# Patient Record
Sex: Female | Born: 1935 | Race: Black or African American | Hispanic: No | Smoking: Never smoker
Health system: Southern US, Community
[De-identification: ages and names within clinical notes are randomized; demographics above are authoritative.]

## PROBLEM LIST (undated history)

## (undated) DIAGNOSIS — E7800 Pure hypercholesterolemia, unspecified: Secondary | ICD-10-CM

## (undated) DIAGNOSIS — C169 Malignant neoplasm of stomach, unspecified: Secondary | ICD-10-CM

## (undated) DIAGNOSIS — Z860100 Personal history of colon polyps, unspecified: Secondary | ICD-10-CM

## (undated) DIAGNOSIS — K219 Gastro-esophageal reflux disease without esophagitis: Secondary | ICD-10-CM

## (undated) DIAGNOSIS — N200 Calculus of kidney: Secondary | ICD-10-CM

## (undated) DIAGNOSIS — M8580 Other specified disorders of bone density and structure, unspecified site: Secondary | ICD-10-CM

## (undated) DIAGNOSIS — E079 Disorder of thyroid, unspecified: Secondary | ICD-10-CM

## (undated) DIAGNOSIS — C49A0 Gastrointestinal stromal tumor, unspecified site: Secondary | ICD-10-CM

## (undated) DIAGNOSIS — C50919 Malignant neoplasm of unspecified site of unspecified female breast: Secondary | ICD-10-CM

## (undated) DIAGNOSIS — Z9221 Personal history of antineoplastic chemotherapy: Secondary | ICD-10-CM

## (undated) DIAGNOSIS — Z8619 Personal history of other infectious and parasitic diseases: Secondary | ICD-10-CM

## (undated) DIAGNOSIS — I10 Essential (primary) hypertension: Secondary | ICD-10-CM

## (undated) HISTORY — DX: Gastrointestinal stromal tumor, unspecified site: C49.A0

## (undated) HISTORY — DX: Personal history of colon polyps, unspecified: Z86.0100

## (undated) HISTORY — PX: TUBAL LIGATION: SHX77

## (undated) HISTORY — PX: ABDOMINAL HYSTERECTOMY: SHX81

## (undated) HISTORY — DX: Disorder of thyroid, unspecified: E07.9

## (undated) HISTORY — DX: Personal history of colonic polyps: Z86.010

## (undated) HISTORY — PX: MASTECTOMY: SHX3

## (undated) HISTORY — DX: Malignant neoplasm of stomach, unspecified: C16.9

## (undated) HISTORY — PX: APPENDECTOMY: SHX54

## (undated) HISTORY — PX: TONSILLECTOMY: SUR1361

## (undated) HISTORY — DX: Calculus of kidney: N20.0

## (undated) HISTORY — DX: Personal history of other infectious and parasitic diseases: Z86.19

## (undated) HISTORY — DX: Other specified disorders of bone density and structure, unspecified site: M85.80

---

## 1976-10-20 HISTORY — PX: THYROIDECTOMY: SHX17

## 1977-10-20 HISTORY — PX: SALPINGOOPHORECTOMY: SHX82

## 1997-10-20 DIAGNOSIS — C50919 Malignant neoplasm of unspecified site of unspecified female breast: Secondary | ICD-10-CM

## 1997-10-20 HISTORY — PX: BREAST EXCISIONAL BIOPSY: SUR124

## 1997-10-20 HISTORY — PX: BREAST SURGERY: SHX581

## 1997-10-20 HISTORY — DX: Malignant neoplasm of unspecified site of unspecified female breast: C50.919

## 2004-08-20 ENCOUNTER — Ambulatory Visit: Payer: Self-pay | Admitting: Pain Medicine

## 2004-08-27 ENCOUNTER — Ambulatory Visit: Payer: Self-pay | Admitting: Oncology

## 2004-08-28 ENCOUNTER — Ambulatory Visit: Payer: Self-pay | Admitting: Pain Medicine

## 2004-09-19 ENCOUNTER — Ambulatory Visit: Payer: Self-pay | Admitting: Oncology

## 2004-10-08 ENCOUNTER — Ambulatory Visit: Payer: Self-pay | Admitting: Pain Medicine

## 2004-11-06 ENCOUNTER — Ambulatory Visit: Payer: Self-pay | Admitting: Pain Medicine

## 2004-12-05 ENCOUNTER — Ambulatory Visit: Payer: Self-pay | Admitting: Pain Medicine

## 2005-01-02 ENCOUNTER — Ambulatory Visit: Payer: Self-pay | Admitting: Pain Medicine

## 2005-01-15 ENCOUNTER — Ambulatory Visit: Payer: Self-pay | Admitting: Pain Medicine

## 2005-01-20 ENCOUNTER — Ambulatory Visit: Payer: Self-pay | Admitting: General Surgery

## 2005-02-20 ENCOUNTER — Ambulatory Visit: Payer: Self-pay | Admitting: Pain Medicine

## 2005-02-26 ENCOUNTER — Ambulatory Visit: Payer: Self-pay | Admitting: Pain Medicine

## 2005-03-05 ENCOUNTER — Ambulatory Visit: Payer: Self-pay | Admitting: Oncology

## 2005-03-20 ENCOUNTER — Ambulatory Visit: Payer: Self-pay | Admitting: Oncology

## 2005-04-03 ENCOUNTER — Ambulatory Visit: Payer: Self-pay | Admitting: Pain Medicine

## 2005-04-23 ENCOUNTER — Ambulatory Visit: Payer: Self-pay | Admitting: Pain Medicine

## 2005-06-05 ENCOUNTER — Ambulatory Visit: Payer: Self-pay | Admitting: Pain Medicine

## 2005-07-02 ENCOUNTER — Ambulatory Visit: Payer: Self-pay | Admitting: Pain Medicine

## 2005-08-02 ENCOUNTER — Emergency Department: Payer: Self-pay | Admitting: General Practice

## 2005-08-05 ENCOUNTER — Ambulatory Visit: Payer: Self-pay | Admitting: Pain Medicine

## 2005-09-03 ENCOUNTER — Ambulatory Visit: Payer: Self-pay | Admitting: Oncology

## 2005-09-20 ENCOUNTER — Ambulatory Visit: Payer: Self-pay | Admitting: Internal Medicine

## 2005-09-24 ENCOUNTER — Ambulatory Visit: Payer: Self-pay | Admitting: Pain Medicine

## 2005-11-13 ENCOUNTER — Ambulatory Visit: Payer: Self-pay | Admitting: Pain Medicine

## 2005-11-24 ENCOUNTER — Ambulatory Visit: Payer: Self-pay | Admitting: Pain Medicine

## 2005-12-30 ENCOUNTER — Ambulatory Visit: Payer: Self-pay | Admitting: Pain Medicine

## 2006-01-07 ENCOUNTER — Ambulatory Visit: Payer: Self-pay | Admitting: Pain Medicine

## 2006-02-10 ENCOUNTER — Ambulatory Visit: Payer: Self-pay | Admitting: Pain Medicine

## 2006-03-11 ENCOUNTER — Ambulatory Visit: Payer: Self-pay | Admitting: General Surgery

## 2006-03-23 ENCOUNTER — Ambulatory Visit: Payer: Self-pay | Admitting: Oncology

## 2006-03-23 ENCOUNTER — Ambulatory Visit: Payer: Self-pay | Admitting: Pain Medicine

## 2006-04-16 ENCOUNTER — Ambulatory Visit: Payer: Self-pay | Admitting: Pain Medicine

## 2006-05-13 ENCOUNTER — Ambulatory Visit: Payer: Self-pay | Admitting: Pain Medicine

## 2006-06-25 ENCOUNTER — Ambulatory Visit: Payer: Self-pay | Admitting: Pain Medicine

## 2006-07-27 ENCOUNTER — Ambulatory Visit: Payer: Self-pay | Admitting: Pain Medicine

## 2006-08-20 ENCOUNTER — Ambulatory Visit: Payer: Self-pay | Admitting: Pain Medicine

## 2006-09-18 ENCOUNTER — Ambulatory Visit: Payer: Self-pay | Admitting: Oncology

## 2006-10-05 ENCOUNTER — Ambulatory Visit: Payer: Self-pay | Admitting: Pain Medicine

## 2006-10-20 HISTORY — PX: COLONOSCOPY: SHX174

## 2006-11-19 ENCOUNTER — Ambulatory Visit: Payer: Self-pay | Admitting: Pain Medicine

## 2006-12-17 ENCOUNTER — Ambulatory Visit: Payer: Self-pay | Admitting: Pain Medicine

## 2006-12-28 ENCOUNTER — Ambulatory Visit: Payer: Self-pay | Admitting: Pain Medicine

## 2007-02-04 ENCOUNTER — Ambulatory Visit: Payer: Self-pay | Admitting: Pain Medicine

## 2007-02-18 ENCOUNTER — Ambulatory Visit: Payer: Self-pay | Admitting: Oncology

## 2007-03-09 ENCOUNTER — Ambulatory Visit: Payer: Self-pay | Admitting: Pain Medicine

## 2007-03-17 ENCOUNTER — Ambulatory Visit: Payer: Self-pay | Admitting: Oncology

## 2007-03-21 ENCOUNTER — Ambulatory Visit: Payer: Self-pay | Admitting: Oncology

## 2007-03-24 ENCOUNTER — Ambulatory Visit: Payer: Self-pay | Admitting: Pain Medicine

## 2007-04-20 ENCOUNTER — Ambulatory Visit: Payer: Self-pay | Admitting: Oncology

## 2007-04-21 ENCOUNTER — Ambulatory Visit: Payer: Self-pay | Admitting: Oncology

## 2007-05-06 ENCOUNTER — Ambulatory Visit: Payer: Self-pay | Admitting: Pain Medicine

## 2007-05-17 ENCOUNTER — Ambulatory Visit: Payer: Self-pay | Admitting: General Surgery

## 2007-06-24 ENCOUNTER — Ambulatory Visit: Payer: Self-pay | Admitting: Pain Medicine

## 2007-07-29 ENCOUNTER — Ambulatory Visit: Payer: Self-pay | Admitting: Pain Medicine

## 2007-08-21 ENCOUNTER — Ambulatory Visit: Payer: Self-pay | Admitting: Oncology

## 2007-09-13 ENCOUNTER — Ambulatory Visit: Payer: Self-pay | Admitting: Oncology

## 2007-09-20 ENCOUNTER — Ambulatory Visit: Payer: Self-pay | Admitting: Oncology

## 2007-09-23 ENCOUNTER — Ambulatory Visit: Payer: Self-pay | Admitting: Pain Medicine

## 2007-12-16 ENCOUNTER — Ambulatory Visit: Payer: Self-pay | Admitting: Pain Medicine

## 2008-01-12 ENCOUNTER — Ambulatory Visit: Payer: Self-pay | Admitting: Pain Medicine

## 2008-02-18 ENCOUNTER — Ambulatory Visit: Payer: Self-pay | Admitting: Oncology

## 2008-02-29 ENCOUNTER — Ambulatory Visit: Payer: Self-pay | Admitting: Pain Medicine

## 2008-03-15 ENCOUNTER — Ambulatory Visit: Payer: Self-pay | Admitting: Oncology

## 2008-03-20 ENCOUNTER — Ambulatory Visit: Payer: Self-pay | Admitting: Oncology

## 2008-04-25 ENCOUNTER — Ambulatory Visit: Payer: Self-pay | Admitting: Pain Medicine

## 2008-04-25 ENCOUNTER — Ambulatory Visit: Payer: Self-pay | Admitting: General Surgery

## 2008-05-10 ENCOUNTER — Ambulatory Visit: Payer: Self-pay | Admitting: Family Medicine

## 2008-06-20 ENCOUNTER — Ambulatory Visit: Payer: Self-pay | Admitting: Pain Medicine

## 2008-07-03 ENCOUNTER — Ambulatory Visit: Payer: Self-pay | Admitting: Pain Medicine

## 2008-08-08 ENCOUNTER — Ambulatory Visit: Payer: Self-pay | Admitting: Pain Medicine

## 2008-09-12 ENCOUNTER — Ambulatory Visit: Payer: Self-pay | Admitting: Pain Medicine

## 2008-09-20 ENCOUNTER — Ambulatory Visit: Payer: Self-pay | Admitting: Pain Medicine

## 2008-10-09 ENCOUNTER — Ambulatory Visit: Payer: Self-pay | Admitting: Pain Medicine

## 2008-12-13 ENCOUNTER — Ambulatory Visit: Payer: Self-pay | Admitting: Pain Medicine

## 2009-02-15 ENCOUNTER — Ambulatory Visit: Payer: Self-pay | Admitting: Pain Medicine

## 2009-03-20 ENCOUNTER — Ambulatory Visit: Payer: Self-pay | Admitting: Oncology

## 2009-03-23 ENCOUNTER — Ambulatory Visit: Payer: Self-pay | Admitting: Oncology

## 2009-04-19 ENCOUNTER — Ambulatory Visit: Payer: Self-pay | Admitting: Oncology

## 2009-04-30 ENCOUNTER — Ambulatory Visit: Payer: Self-pay | Admitting: General Surgery

## 2009-05-17 ENCOUNTER — Ambulatory Visit: Payer: Self-pay | Admitting: Pain Medicine

## 2009-06-04 ENCOUNTER — Ambulatory Visit: Payer: Self-pay | Admitting: Pain Medicine

## 2009-06-14 ENCOUNTER — Ambulatory Visit: Payer: Self-pay | Admitting: Pain Medicine

## 2009-09-04 ENCOUNTER — Ambulatory Visit: Payer: Self-pay | Admitting: Pain Medicine

## 2009-10-09 ENCOUNTER — Ambulatory Visit: Payer: Self-pay | Admitting: Pain Medicine

## 2009-10-17 ENCOUNTER — Ambulatory Visit: Payer: Self-pay | Admitting: Pain Medicine

## 2009-12-20 ENCOUNTER — Ambulatory Visit: Payer: Self-pay | Admitting: Pain Medicine

## 2009-12-24 ENCOUNTER — Ambulatory Visit: Payer: Self-pay | Admitting: Pain Medicine

## 2010-01-22 ENCOUNTER — Ambulatory Visit: Payer: Self-pay | Admitting: Pain Medicine

## 2010-03-21 ENCOUNTER — Ambulatory Visit: Payer: Self-pay | Admitting: Pain Medicine

## 2010-04-19 ENCOUNTER — Ambulatory Visit: Payer: Self-pay | Admitting: Ophthalmology

## 2010-04-29 ENCOUNTER — Ambulatory Visit: Payer: Self-pay | Admitting: Ophthalmology

## 2010-05-01 ENCOUNTER — Ambulatory Visit: Payer: Self-pay | Admitting: General Surgery

## 2010-05-07 ENCOUNTER — Ambulatory Visit: Payer: Self-pay | Admitting: Pain Medicine

## 2010-08-13 ENCOUNTER — Ambulatory Visit: Payer: Self-pay | Admitting: Pain Medicine

## 2010-10-20 HISTORY — PX: HERNIA REPAIR: SHX51

## 2010-10-29 ENCOUNTER — Ambulatory Visit: Payer: Self-pay | Admitting: Pain Medicine

## 2011-02-03 ENCOUNTER — Ambulatory Visit: Payer: Self-pay | Admitting: Pain Medicine

## 2011-05-01 ENCOUNTER — Ambulatory Visit: Payer: Self-pay | Admitting: Pain Medicine

## 2011-05-07 ENCOUNTER — Ambulatory Visit: Payer: Self-pay | Admitting: Pain Medicine

## 2011-05-22 ENCOUNTER — Ambulatory Visit: Payer: Self-pay | Admitting: General Surgery

## 2011-05-29 ENCOUNTER — Ambulatory Visit: Payer: Self-pay | Admitting: Pain Medicine

## 2011-07-01 ENCOUNTER — Ambulatory Visit: Payer: Self-pay | Admitting: Family Medicine

## 2011-08-19 ENCOUNTER — Ambulatory Visit: Payer: Self-pay | Admitting: Pain Medicine

## 2011-10-21 DIAGNOSIS — C169 Malignant neoplasm of stomach, unspecified: Secondary | ICD-10-CM

## 2011-10-21 DIAGNOSIS — C49A0 Gastrointestinal stromal tumor, unspecified site: Secondary | ICD-10-CM

## 2011-10-21 HISTORY — PX: UPPER GI ENDOSCOPY: SHX6162

## 2011-10-21 HISTORY — DX: Malignant neoplasm of stomach, unspecified: C16.9

## 2011-10-21 HISTORY — PX: EYE SURGERY: SHX253

## 2011-10-21 HISTORY — DX: Gastrointestinal stromal tumor, unspecified site: C49.A0

## 2011-10-21 HISTORY — PX: LAPAROTOMY: SHX154

## 2011-10-24 DIAGNOSIS — K219 Gastro-esophageal reflux disease without esophagitis: Secondary | ICD-10-CM | POA: Diagnosis not present

## 2011-10-24 DIAGNOSIS — R197 Diarrhea, unspecified: Secondary | ICD-10-CM | POA: Diagnosis not present

## 2011-11-20 ENCOUNTER — Ambulatory Visit: Payer: Self-pay | Admitting: Pain Medicine

## 2011-11-20 DIAGNOSIS — Z853 Personal history of malignant neoplasm of breast: Secondary | ICD-10-CM | POA: Diagnosis not present

## 2011-11-20 DIAGNOSIS — IMO0002 Reserved for concepts with insufficient information to code with codable children: Secondary | ICD-10-CM | POA: Diagnosis not present

## 2011-11-20 DIAGNOSIS — R51 Headache: Secondary | ICD-10-CM | POA: Diagnosis not present

## 2011-11-20 DIAGNOSIS — M79609 Pain in unspecified limb: Secondary | ICD-10-CM | POA: Diagnosis not present

## 2011-11-20 DIAGNOSIS — Z9889 Other specified postprocedural states: Secondary | ICD-10-CM | POA: Diagnosis not present

## 2011-11-20 DIAGNOSIS — M5137 Other intervertebral disc degeneration, lumbosacral region: Secondary | ICD-10-CM | POA: Diagnosis not present

## 2011-11-20 DIAGNOSIS — Z7982 Long term (current) use of aspirin: Secondary | ICD-10-CM | POA: Diagnosis not present

## 2011-11-20 DIAGNOSIS — Z79899 Other long term (current) drug therapy: Secondary | ICD-10-CM | POA: Diagnosis not present

## 2011-11-20 DIAGNOSIS — M545 Low back pain, unspecified: Secondary | ICD-10-CM | POA: Diagnosis not present

## 2011-12-09 DIAGNOSIS — K3189 Other diseases of stomach and duodenum: Secondary | ICD-10-CM | POA: Diagnosis not present

## 2011-12-09 DIAGNOSIS — R1013 Epigastric pain: Secondary | ICD-10-CM | POA: Diagnosis not present

## 2011-12-09 DIAGNOSIS — I10 Essential (primary) hypertension: Secondary | ICD-10-CM | POA: Diagnosis not present

## 2011-12-09 DIAGNOSIS — Z23 Encounter for immunization: Secondary | ICD-10-CM | POA: Diagnosis not present

## 2011-12-09 DIAGNOSIS — A048 Other specified bacterial intestinal infections: Secondary | ICD-10-CM | POA: Diagnosis not present

## 2011-12-09 DIAGNOSIS — R1012 Left upper quadrant pain: Secondary | ICD-10-CM | POA: Diagnosis not present

## 2011-12-19 DIAGNOSIS — E119 Type 2 diabetes mellitus without complications: Secondary | ICD-10-CM | POA: Diagnosis not present

## 2011-12-19 DIAGNOSIS — R1012 Left upper quadrant pain: Secondary | ICD-10-CM | POA: Diagnosis not present

## 2011-12-19 DIAGNOSIS — E7800 Pure hypercholesterolemia, unspecified: Secondary | ICD-10-CM | POA: Diagnosis not present

## 2011-12-19 DIAGNOSIS — A048 Other specified bacterial intestinal infections: Secondary | ICD-10-CM | POA: Diagnosis not present

## 2011-12-19 DIAGNOSIS — I10 Essential (primary) hypertension: Secondary | ICD-10-CM | POA: Diagnosis not present

## 2011-12-23 DIAGNOSIS — E785 Hyperlipidemia, unspecified: Secondary | ICD-10-CM | POA: Diagnosis not present

## 2011-12-23 DIAGNOSIS — E119 Type 2 diabetes mellitus without complications: Secondary | ICD-10-CM | POA: Diagnosis not present

## 2011-12-23 DIAGNOSIS — I10 Essential (primary) hypertension: Secondary | ICD-10-CM | POA: Diagnosis not present

## 2011-12-23 DIAGNOSIS — R718 Other abnormality of red blood cells: Secondary | ICD-10-CM | POA: Diagnosis not present

## 2012-01-07 DIAGNOSIS — R109 Unspecified abdominal pain: Secondary | ICD-10-CM | POA: Diagnosis not present

## 2012-01-20 DIAGNOSIS — R1013 Epigastric pain: Secondary | ICD-10-CM | POA: Diagnosis not present

## 2012-01-20 DIAGNOSIS — K3189 Other diseases of stomach and duodenum: Secondary | ICD-10-CM | POA: Diagnosis not present

## 2012-01-20 DIAGNOSIS — R12 Heartburn: Secondary | ICD-10-CM | POA: Diagnosis not present

## 2012-01-20 DIAGNOSIS — D490 Neoplasm of unspecified behavior of digestive system: Secondary | ICD-10-CM | POA: Diagnosis not present

## 2012-01-22 ENCOUNTER — Ambulatory Visit: Payer: Self-pay | Admitting: Pain Medicine

## 2012-01-22 DIAGNOSIS — M79609 Pain in unspecified limb: Secondary | ICD-10-CM | POA: Diagnosis not present

## 2012-01-22 DIAGNOSIS — Z9889 Other specified postprocedural states: Secondary | ICD-10-CM | POA: Diagnosis not present

## 2012-01-22 DIAGNOSIS — Z7982 Long term (current) use of aspirin: Secondary | ICD-10-CM | POA: Diagnosis not present

## 2012-01-22 DIAGNOSIS — Z79899 Other long term (current) drug therapy: Secondary | ICD-10-CM | POA: Diagnosis not present

## 2012-01-22 DIAGNOSIS — M545 Low back pain, unspecified: Secondary | ICD-10-CM | POA: Diagnosis not present

## 2012-01-22 DIAGNOSIS — R51 Headache: Secondary | ICD-10-CM | POA: Diagnosis not present

## 2012-01-22 DIAGNOSIS — IMO0002 Reserved for concepts with insufficient information to code with codable children: Secondary | ICD-10-CM | POA: Diagnosis not present

## 2012-01-27 ENCOUNTER — Telehealth: Payer: Self-pay

## 2012-01-27 ENCOUNTER — Other Ambulatory Visit: Payer: Self-pay

## 2012-01-27 DIAGNOSIS — K3189 Other diseases of stomach and duodenum: Secondary | ICD-10-CM

## 2012-01-27 NOTE — Telephone Encounter (Signed)
Pt has been instructed and meds reviewed.  Pt will call with any questions or concerns after reviewing the written instructions

## 2012-02-10 ENCOUNTER — Encounter (HOSPITAL_COMMUNITY): Payer: Self-pay | Admitting: *Deleted

## 2012-02-12 ENCOUNTER — Ambulatory Visit (HOSPITAL_COMMUNITY): Payer: Medicare Other | Admitting: Anesthesiology

## 2012-02-12 ENCOUNTER — Encounter (HOSPITAL_COMMUNITY): Payer: Self-pay | Admitting: Gastroenterology

## 2012-02-12 ENCOUNTER — Ambulatory Visit (HOSPITAL_COMMUNITY)
Admission: RE | Admit: 2012-02-12 | Discharge: 2012-02-12 | Disposition: A | Discharge status: Home / Self Care | Payer: Medicare Other | Source: Ambulatory Visit | Attending: Gastroenterology | Admitting: Gastroenterology

## 2012-02-12 ENCOUNTER — Encounter (HOSPITAL_COMMUNITY): Payer: Self-pay | Admitting: Anesthesiology

## 2012-02-12 ENCOUNTER — Encounter (HOSPITAL_COMMUNITY): Payer: Self-pay | Admitting: *Deleted

## 2012-02-12 ENCOUNTER — Encounter (HOSPITAL_COMMUNITY)
Admission: RE | Disposition: A | Discharge status: Home / Self Care | Payer: Self-pay | Source: Ambulatory Visit | Attending: Gastroenterology

## 2012-02-12 DIAGNOSIS — I10 Essential (primary) hypertension: Secondary | ICD-10-CM | POA: Insufficient documentation

## 2012-02-12 DIAGNOSIS — E119 Type 2 diabetes mellitus without complications: Secondary | ICD-10-CM | POA: Insufficient documentation

## 2012-02-12 DIAGNOSIS — Z9010 Acquired absence of unspecified breast and nipple: Secondary | ICD-10-CM | POA: Diagnosis not present

## 2012-02-12 DIAGNOSIS — R634 Abnormal weight loss: Secondary | ICD-10-CM | POA: Diagnosis not present

## 2012-02-12 DIAGNOSIS — R109 Unspecified abdominal pain: Secondary | ICD-10-CM | POA: Diagnosis not present

## 2012-02-12 DIAGNOSIS — K319 Disease of stomach and duodenum, unspecified: Secondary | ICD-10-CM | POA: Diagnosis not present

## 2012-02-12 DIAGNOSIS — R933 Abnormal findings on diagnostic imaging of other parts of digestive tract: Secondary | ICD-10-CM | POA: Diagnosis not present

## 2012-02-12 DIAGNOSIS — E7800 Pure hypercholesterolemia, unspecified: Secondary | ICD-10-CM | POA: Diagnosis not present

## 2012-02-12 DIAGNOSIS — Z853 Personal history of malignant neoplasm of breast: Secondary | ICD-10-CM | POA: Insufficient documentation

## 2012-02-12 DIAGNOSIS — K219 Gastro-esophageal reflux disease without esophagitis: Secondary | ICD-10-CM | POA: Insufficient documentation

## 2012-02-12 HISTORY — DX: Essential (primary) hypertension: I10

## 2012-02-12 HISTORY — PX: EUS: SHX5427

## 2012-02-12 HISTORY — DX: Gastro-esophageal reflux disease without esophagitis: K21.9

## 2012-02-12 HISTORY — DX: Pure hypercholesterolemia, unspecified: E78.00

## 2012-02-12 SURGERY — UPPER ENDOSCOPIC ULTRASOUND (EUS) LINEAR
Anesthesia: Monitor Anesthesia Care

## 2012-02-12 MED ORDER — KETAMINE HCL 10 MG/ML IJ SOLN
INTRAMUSCULAR | Status: DC | PRN
  Administered 2012-02-12: 5 mg via INTRAVENOUS

## 2012-02-12 MED ORDER — PROPOFOL 10 MG/ML IV EMUL
INTRAVENOUS | Status: DC | PRN
  Administered 2012-02-12: 50 ug/kg/min via INTRAVENOUS

## 2012-02-12 MED ORDER — DROPERIDOL 2.5 MG/ML IJ SOLN
INTRAMUSCULAR | Status: DC | PRN
  Administered 2012-02-12: .5 mg via INTRAVENOUS

## 2012-02-12 MED ORDER — SODIUM CHLORIDE 0.9 % IV SOLN
INTRAVENOUS | Status: DC | PRN
  Administered 2012-02-12: 15:00:00 via INTRAVENOUS

## 2012-02-12 MED ORDER — LACTATED RINGERS IV SOLN
INTRAVENOUS | Status: DC
  Administered 2012-02-12: 12:00:00 via INTRAVENOUS

## 2012-02-12 MED ORDER — ONDANSETRON HCL 4 MG/2ML IJ SOLN
INTRAMUSCULAR | Status: DC | PRN
  Administered 2012-02-12 (×2): 2 mg via INTRAVENOUS

## 2012-02-12 MED ORDER — FENTANYL CITRATE 0.05 MG/ML IJ SOLN
INTRAMUSCULAR | Status: DC | PRN
  Administered 2012-02-12 (×2): 50 ug via INTRAVENOUS

## 2012-02-12 MED ORDER — LACTATED RINGERS IV SOLN
INTRAVENOUS | Status: DC | PRN
  Administered 2012-02-12: 14:00:00 via INTRAVENOUS

## 2012-02-12 MED ORDER — MIDAZOLAM HCL 5 MG/5ML IJ SOLN
INTRAMUSCULAR | Status: DC | PRN
  Administered 2012-02-12: 1 mg via INTRAVENOUS

## 2012-02-12 NOTE — Discharge Instructions (Signed)

## 2012-02-12 NOTE — Op Note (Signed)
[facility] [address] [place], [place]  [address]  ENDOSCOPIC ULTRASOUND PROCEDURE REPORT  PATIENT:  Rachael Castro, Rachael Castro  MR#:  [id number] BIRTHDATE:  1931/04/25  GENDER:  female ENDOSCOPIST:  Rachael Fee, MD PROCEDURE DATE:  02/12/2012 PROCEDURE:  Upper EUS w/FNA ASA CLASS:  Class II INDICATIONS:  "sessile, 3cm mass" in stomach noted by Dr. Niel Hummer EGD earlier this month; +abdominal pain, + weight loss MEDICATIONS:   MAC sedation, administered by CRNA  DESCRIPTION OF PROCEDURE:   After the risks benefits and alternatives of the procedure were  explained, informed consent was obtained. The patient was then placed in the left, lateral, decubitus postion and IV sedation was administered. Throughout the procedure, the patient's blood pressure, pulse and oxygen saturations were monitored continuously.  Under direct visualization, the EUS [id number] endoscope was introduced through the mouth and advanced to the second portion of the duodenum.  Water was used as necessary to provide an acoustic interface.  Upon completion of the imaging, water was removed and the patient was sent to the recovery room in satisfactory condition. <<PROCEDUREIMAGES>>  Endoscopic findings: 1. Large subepithelial mass in gastric body, normal overlying muocsa. This was approximately 4-5cm endoscopically  EUS findings: 1. The mass above corresponded with a 6cm (maximum dimension), heterogeneous, predominantly hypoechoic mass that appears to communicate with muscularis propria layer of gastric wall. This was sampled with a single pass with a 22 gauge BS EUS FNA needle.  2. The wall of stomach was otherwise normal. 3.  No perigastric adenopathy. 4. Limited views of pancreas, liver, spleen were all normal  Impression: 6cm subepithelial mass in wall of stomach. 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 Await final cytology but given its size and good chance that it is causing her abdominal pains,  contributing to her weight loss she will need surgical referral to consider resection even if the biopsy is non-diagnostic.  ______________________________ Rachael Fee, MD  n. eSIGNED:   Rachael Fee at 02/12/2012 03:25 PM  Fredirick Lathe, [phone]

## 2012-02-12 NOTE — Transfer of Care (Signed)
Immediate Anesthesia Transfer of Care Note  Patient: Rachael Castro  Procedure(s) Performed: Procedure(s) (LRB): UPPER ENDOSCOPIC ULTRASOUND (EUS) LINEAR (N/A)  Patient Location: PACU  Anesthesia Type: MAC  Level of Consciousness: awake and alert   Airway & Oxygen Therapy: Patient Spontanous Breathing and Patient connected to nasal cannula oxygen  Post-op Assessment: Report given to PACU RN and Post -op Vital signs reviewed and stable  Post vital signs: Reviewed and stable  Complications: No apparent anesthesia complications

## 2012-02-12 NOTE — Anesthesia Preprocedure Evaluation (Signed)
Anesthesia Evaluation  Patient identified by MRN, date of birth, ID band Patient awake    Reviewed: Allergy & Precautions, H&P , NPO status , Patient's Chart, lab work & pertinent test results, reviewed documented beta blocker date and time   Airway Mallampati: II  Neck ROM: Full    Dental  (+) Upper Dentures and Partial Lower   Pulmonary neg pulmonary ROS,  breath sounds clear to auscultation        Cardiovascular hypertension, Pt. on medications Rhythm:Regular Rate:Normal  Denies cardiac symptoms   Neuro/Psych negative neurological ROS  negative psych ROS   GI/Hepatic Neg liver ROS, Gastric mass   Endo/Other  Diabetes mellitus-, Type 2, Oral Hypoglycemic AgentsFair control  Renal/GU negative Renal ROS  negative genitourinary   Musculoskeletal negative musculoskeletal ROS (+)   Abdominal   Peds negative pediatric ROS (+)  Hematology negative hematology ROS (+)   Anesthesia Other Findings   Reproductive/Obstetrics negative OB ROS                           Anesthesia Physical Anesthesia Plan  ASA: III  Anesthesia Plan: MAC   Post-op Pain Management:    Induction: Intravenous  Airway Management Planned: Mask  Additional Equipment:   Intra-op Plan:   Post-operative Plan:   Informed Consent: I have reviewed the patients History and Physical, chart, labs and discussed the procedure including the risks, benefits and alternatives for the proposed anesthesia with the patient or authorized representative who has indicated his/her understanding and acceptance.   Dental advisory given  Plan Discussed with: CRNA and Surgeon  Anesthesia Plan Comments:         Anesthesia Quick Evaluation

## 2012-02-12 NOTE — H&P (Signed)
  HPI: This is a woman with SM mass in stomach on recent EGD (Dr. Niel Hummer)    Past Medical History  Diagnosis Date  . Hypertension   . Diabetes mellitus   . GERD (gastroesophageal reflux disease)   . Neuromuscular disorder     hx shingles  . b     breast cancer  . Hypercholesteremia     Past Surgical History  Procedure Date  . Mastectomy   . Abdominal hysterectomy   . Tubal ligation   . Breast surgery   . Tonsillectomy   . Appendectomy     Current Facility-Administered Medications  Medication Dose Route Frequency Provider Last Rate Last Dose  . lactated ringers infusion   Intravenous Continuous Rachael Fee, MD 20 mL/hr at 02/12/12 1229     Facility-Administered Medications Ordered in Other Encounters  Medication Dose Route Frequency Provider Last Rate Last Dose  . 0.9 %  sodium chloride infusion    Continuous PRN Donn Pierini, CRNA        Allergies as of 01/27/2012  . (No Known Allergies)    History reviewed. No pertinent family history.  History   Social History  . Marital Status: Married    Spouse Name: N/A    Number of Children: N/A  . Years of Education: N/A   Occupational History  . Not on file.   Social History Main Topics  . Smoking status: Never Smoker   . Smokeless tobacco: Not on file  . Alcohol Use: No  . Drug Use: No  . Sexually Active:    Other Topics Concern  . Not on file   Social History Narrative  . No narrative on file      Physical Exam: BP 156/78  Temp(Src) 97.6 F (36.4 C) (Oral)  Resp 19  Ht 5\' 4"  (1.626 m)  Wt 132 lb (59.875 kg)  BMI 22.66 kg/m2  SpO2 99% Constitutional: generally well-appearing Psychiatric: alert and oriented x3 Abdomen: soft, nontender, nondistended, no obvious ascites, no peritoneal signs, normal bowel sounds     Assessment and plan: 78 y.o. female with mass  In stomach  EUS today

## 2012-02-13 ENCOUNTER — Encounter (HOSPITAL_COMMUNITY): Payer: Self-pay | Admitting: Gastroenterology

## 2012-02-13 NOTE — Anesthesia Postprocedure Evaluation (Signed)
  Anesthesia Post-op Note  Patient: Rachael Castro  Procedure(s) Performed: Procedure(s) (LRB): UPPER ENDOSCOPIC ULTRASOUND (EUS) LINEAR (N/A)  Patient Location: PACU  Anesthesia Type: MAC  Level of Consciousness: oriented and sedated  Airway and Oxygen Therapy: Patient Spontanous Breathing  Post-op Pain: mild  Post-op Assessment: Post-op Vital signs reviewed, Patient's Cardiovascular Status Stable, Respiratory Function Stable and Patent Airway  Post-op Vital Signs: stable  Complications: No apparent anesthesia complications

## 2012-02-17 DIAGNOSIS — R109 Unspecified abdominal pain: Secondary | ICD-10-CM | POA: Diagnosis not present

## 2012-02-19 ENCOUNTER — Other Ambulatory Visit: Payer: Self-pay | Admitting: General Surgery

## 2012-02-19 DIAGNOSIS — D375 Neoplasm of uncertain behavior of rectum: Secondary | ICD-10-CM | POA: Diagnosis not present

## 2012-02-19 DIAGNOSIS — D371 Neoplasm of uncertain behavior of stomach: Secondary | ICD-10-CM | POA: Diagnosis not present

## 2012-02-19 LAB — CREATININE, SERUM
EGFR (African American): >60
EGFR (Non-African Amer.): >60

## 2012-02-20 ENCOUNTER — Ambulatory Visit: Payer: Self-pay | Admitting: General Surgery

## 2012-02-20 DIAGNOSIS — M799 Soft tissue disorder, unspecified: Secondary | ICD-10-CM | POA: Diagnosis not present

## 2012-02-20 DIAGNOSIS — D371 Neoplasm of uncertain behavior of stomach: Secondary | ICD-10-CM | POA: Diagnosis not present

## 2012-02-23 ENCOUNTER — Ambulatory Visit: Payer: Self-pay | Admitting: General Surgery

## 2012-02-23 DIAGNOSIS — Z01812 Encounter for preprocedural laboratory examination: Secondary | ICD-10-CM | POA: Diagnosis not present

## 2012-02-23 DIAGNOSIS — Z01810 Encounter for preprocedural cardiovascular examination: Secondary | ICD-10-CM | POA: Diagnosis not present

## 2012-02-23 DIAGNOSIS — I10 Essential (primary) hypertension: Secondary | ICD-10-CM | POA: Diagnosis not present

## 2012-02-23 DIAGNOSIS — K319 Disease of stomach and duodenum, unspecified: Secondary | ICD-10-CM | POA: Diagnosis not present

## 2012-02-23 LAB — COMPREHENSIVE METABOLIC PANEL
Anion Gap: 8 (ref 7–16)
BUN: 14 mg/dL (ref 7–18)
Bilirubin,Total: 0.4 mg/dL (ref 0.2–1.0)
Chloride: 104 mmol/L (ref 98–107)
Creatinine: 0.81 mg/dL (ref 0.60–1.30)
EGFR (African American): >60
EGFR (Non-African Amer.): >60
Potassium: 4 mmol/L (ref 3.5–5.1)
SGPT (ALT): 54 U/L
Sodium: 141 mmol/L (ref 136–145)

## 2012-02-23 LAB — CBC WITH DIFFERENTIAL/PLATELET
Basophil #: 0 10*3/uL (ref 0.0–0.1)
Basophil %: 0.3 %
Eosinophil #: 0.1 10*3/uL (ref 0.0–0.7)
Lymphocyte #: 1.8 10*3/uL (ref 1.0–3.6)
Monocyte %: 5.3 %
Neutrophil #: 4 10*3/uL (ref 1.4–6.5)
Neutrophil %: 63.6 %
RBC: 3.59 10*6/uL — ABNORMAL LOW (ref 3.80–5.20)
RDW: 13 % (ref 11.5–14.5)

## 2012-02-27 ENCOUNTER — Inpatient Hospital Stay: Payer: Self-pay | Admitting: General Surgery

## 2012-02-27 DIAGNOSIS — Z9010 Acquired absence of unspecified breast and nipple: Secondary | ICD-10-CM | POA: Diagnosis not present

## 2012-02-27 DIAGNOSIS — I951 Orthostatic hypotension: Secondary | ICD-10-CM | POA: Diagnosis not present

## 2012-02-27 DIAGNOSIS — I10 Essential (primary) hypertension: Secondary | ICD-10-CM | POA: Diagnosis present

## 2012-02-27 DIAGNOSIS — D62 Acute posthemorrhagic anemia: Secondary | ICD-10-CM | POA: Diagnosis not present

## 2012-02-27 DIAGNOSIS — Z853 Personal history of malignant neoplasm of breast: Secondary | ICD-10-CM | POA: Diagnosis not present

## 2012-02-27 DIAGNOSIS — D649 Anemia, unspecified: Secondary | ICD-10-CM | POA: Diagnosis not present

## 2012-02-27 DIAGNOSIS — D481 Neoplasm of uncertain behavior of connective and other soft tissue: Secondary | ICD-10-CM | POA: Diagnosis present

## 2012-02-27 DIAGNOSIS — D371 Neoplasm of uncertain behavior of stomach: Secondary | ICD-10-CM | POA: Diagnosis not present

## 2012-02-27 DIAGNOSIS — D378 Neoplasm of uncertain behavior of other specified digestive organs: Secondary | ICD-10-CM | POA: Diagnosis not present

## 2012-02-27 DIAGNOSIS — E785 Hyperlipidemia, unspecified: Secondary | ICD-10-CM | POA: Diagnosis present

## 2012-02-27 DIAGNOSIS — Z7982 Long term (current) use of aspirin: Secondary | ICD-10-CM | POA: Diagnosis not present

## 2012-02-27 DIAGNOSIS — R42 Dizziness and giddiness: Secondary | ICD-10-CM | POA: Diagnosis not present

## 2012-02-27 DIAGNOSIS — E119 Type 2 diabetes mellitus without complications: Secondary | ICD-10-CM | POA: Diagnosis not present

## 2012-02-27 DIAGNOSIS — C169 Malignant neoplasm of stomach, unspecified: Secondary | ICD-10-CM | POA: Diagnosis not present

## 2012-02-27 LAB — CBC WITH DIFFERENTIAL/PLATELET
Basophil #: 0 10*3/uL (ref 0.0–0.1)
Eosinophil %: 0.2 %
HCT: 23 % — ABNORMAL LOW (ref 35.0–47.0)
HGB: 7.7 g/dL — ABNORMAL LOW (ref 12.0–16.0)
Lymphocyte #: 2.2 10*3/uL (ref 1.0–3.6)
Lymphocyte %: 25.4 %
MCV: 101 fL — ABNORMAL HIGH (ref 80–100)
Monocyte %: 4.7 %
Neutrophil %: 69.6 %
Platelet: 124 10*3/uL — ABNORMAL LOW (ref 150–440)
RBC: 2.28 10*6/uL — ABNORMAL LOW (ref 3.80–5.20)
RDW: 12.7 % (ref 11.5–14.5)
WBC: 8.5 10*3/uL (ref 3.6–11.0)

## 2012-02-28 LAB — CREATININE, SERUM
EGFR (African American): >60
EGFR (Non-African Amer.): >60

## 2012-02-28 LAB — HEMOGLOBIN A1C: Hemoglobin A1C: 6.2 % (ref 4.2–6.3)

## 2012-02-29 LAB — BASIC METABOLIC PANEL
BUN: 18 mg/dL (ref 7–18)
Co2: 26 mmol/L (ref 21–32)
Creatinine: 0.63 mg/dL (ref 0.60–1.30)
EGFR (African American): >60
EGFR (Non-African Amer.): >60
Glucose: 89 mg/dL (ref 65–99)
Osmolality: 290 (ref 275–301)
Potassium: 3.3 mmol/L — ABNORMAL LOW (ref 3.5–5.1)
Sodium: 145 mmol/L (ref 136–145)

## 2012-02-29 LAB — CBC WITH DIFFERENTIAL/PLATELET
Basophil #: 0.2 10*3/uL — ABNORMAL HIGH (ref 0.0–0.1)
Eosinophil %: 1.8 %
HGB: 10.1 g/dL — ABNORMAL LOW (ref 12.0–16.0)
Lymphocyte #: 1.5 10*3/uL (ref 1.0–3.6)
MCH: 33.1 pg (ref 26.0–34.0)
MCHC: 34.6 g/dL (ref 32.0–36.0)
Monocyte #: 0.4 x10 3/mm (ref 0.2–0.9)
Monocyte %: 5.3 %
Neutrophil #: 5 10*3/uL (ref 1.4–6.5)
Platelet: 73 10*3/uL — ABNORMAL LOW (ref 150–440)
RBC: 3.06 10*6/uL — ABNORMAL LOW (ref 3.80–5.20)
RDW: 13.9 % (ref 11.5–14.5)

## 2012-03-01 LAB — CBC WITH DIFFERENTIAL/PLATELET
Basophil #: 0 10*3/uL (ref 0.0–0.1)
Eosinophil %: 2.7 %
HCT: 29.8 % — ABNORMAL LOW (ref 35.0–47.0)
HGB: 10.3 g/dL — ABNORMAL LOW (ref 12.0–16.0)
MCHC: 34.4 g/dL (ref 32.0–36.0)
Monocyte #: 0.4 x10 3/mm (ref 0.2–0.9)
Monocyte %: 7.1 %
Neutrophil #: 3.2 10*3/uL (ref 1.4–6.5)
Neutrophil %: 59.6 %
Platelet: 108 10*3/uL — ABNORMAL LOW (ref 150–440)
WBC: 5.3 10*3/uL (ref 3.6–11.0)

## 2012-03-10 ENCOUNTER — Ambulatory Visit: Payer: Self-pay | Admitting: Oncology

## 2012-03-11 LAB — PATHOLOGY REPORT

## 2012-03-17 ENCOUNTER — Ambulatory Visit: Payer: Self-pay | Admitting: Oncology

## 2012-03-17 DIAGNOSIS — Z79899 Other long term (current) drug therapy: Secondary | ICD-10-CM | POA: Diagnosis not present

## 2012-03-17 DIAGNOSIS — C169 Malignant neoplasm of stomach, unspecified: Secondary | ICD-10-CM | POA: Diagnosis not present

## 2012-03-17 DIAGNOSIS — Z9221 Personal history of antineoplastic chemotherapy: Secondary | ICD-10-CM | POA: Diagnosis not present

## 2012-03-17 DIAGNOSIS — R5381 Other malaise: Secondary | ICD-10-CM | POA: Diagnosis not present

## 2012-03-17 DIAGNOSIS — Z9010 Acquired absence of unspecified breast and nipple: Secondary | ICD-10-CM | POA: Diagnosis not present

## 2012-03-17 DIAGNOSIS — R630 Anorexia: Secondary | ICD-10-CM | POA: Diagnosis not present

## 2012-03-17 DIAGNOSIS — Z9223 Personal history of estrogen therapy: Secondary | ICD-10-CM | POA: Diagnosis not present

## 2012-03-17 DIAGNOSIS — I10 Essential (primary) hypertension: Secondary | ICD-10-CM | POA: Diagnosis not present

## 2012-03-17 DIAGNOSIS — Z853 Personal history of malignant neoplasm of breast: Secondary | ICD-10-CM | POA: Diagnosis not present

## 2012-03-17 DIAGNOSIS — R5383 Other fatigue: Secondary | ICD-10-CM | POA: Diagnosis not present

## 2012-03-17 DIAGNOSIS — Z7982 Long term (current) use of aspirin: Secondary | ICD-10-CM | POA: Diagnosis not present

## 2012-03-17 LAB — COMPREHENSIVE METABOLIC PANEL
Albumin: 3.5 g/dL (ref 3.4–5.0)
Alkaline Phosphatase: 99 U/L (ref 50–136)
BUN: 17 mg/dL (ref 7–18)
Bilirubin,Total: 0.4 mg/dL (ref 0.2–1.0)
Co2: 28 mmol/L (ref 21–32)
Creatinine: 0.87 mg/dL (ref 0.60–1.30)
Glucose: 160 mg/dL — ABNORMAL HIGH (ref 65–99)
Potassium: 3.9 mmol/L (ref 3.5–5.1)
SGOT(AST): 76 U/L — ABNORMAL HIGH (ref 15–37)
Sodium: 141 mmol/L (ref 136–145)

## 2012-03-17 LAB — CBC CANCER CENTER
Basophil %: 0.3 %
Eosinophil #: 0.1 x10 3/mm (ref 0.0–0.7)
Eosinophil %: 1.3 %
HCT: 37.5 % (ref 35.0–47.0)
HGB: 12.3 g/dL (ref 12.0–16.0)
Lymphocyte #: 2.9 x10 3/mm (ref 1.0–3.6)
MCH: 32.1 pg (ref 26.0–34.0)
MCV: 98 fL (ref 80–100)
Platelet: 280 x10 3/mm (ref 150–440)
WBC: 8 x10 3/mm (ref 3.6–11.0)

## 2012-03-20 ENCOUNTER — Ambulatory Visit: Payer: Self-pay | Admitting: Oncology

## 2012-03-20 DIAGNOSIS — Z7982 Long term (current) use of aspirin: Secondary | ICD-10-CM | POA: Diagnosis not present

## 2012-03-20 DIAGNOSIS — I10 Essential (primary) hypertension: Secondary | ICD-10-CM | POA: Diagnosis not present

## 2012-03-20 DIAGNOSIS — R944 Abnormal results of kidney function studies: Secondary | ICD-10-CM | POA: Diagnosis not present

## 2012-03-20 DIAGNOSIS — N289 Disorder of kidney and ureter, unspecified: Secondary | ICD-10-CM | POA: Diagnosis not present

## 2012-03-20 DIAGNOSIS — R112 Nausea with vomiting, unspecified: Secondary | ICD-10-CM | POA: Diagnosis not present

## 2012-03-20 DIAGNOSIS — C169 Malignant neoplasm of stomach, unspecified: Secondary | ICD-10-CM | POA: Diagnosis not present

## 2012-03-20 DIAGNOSIS — R197 Diarrhea, unspecified: Secondary | ICD-10-CM | POA: Diagnosis not present

## 2012-03-20 DIAGNOSIS — Z79899 Other long term (current) drug therapy: Secondary | ICD-10-CM | POA: Diagnosis not present

## 2012-03-20 DIAGNOSIS — E119 Type 2 diabetes mellitus without complications: Secondary | ICD-10-CM | POA: Diagnosis not present

## 2012-03-20 DIAGNOSIS — R51 Headache: Secondary | ICD-10-CM | POA: Diagnosis not present

## 2012-04-02 DIAGNOSIS — R112 Nausea with vomiting, unspecified: Secondary | ICD-10-CM | POA: Diagnosis not present

## 2012-04-02 DIAGNOSIS — R197 Diarrhea, unspecified: Secondary | ICD-10-CM | POA: Diagnosis not present

## 2012-04-02 DIAGNOSIS — C169 Malignant neoplasm of stomach, unspecified: Secondary | ICD-10-CM | POA: Diagnosis not present

## 2012-04-02 DIAGNOSIS — R51 Headache: Secondary | ICD-10-CM | POA: Diagnosis not present

## 2012-04-02 LAB — COMPREHENSIVE METABOLIC PANEL
Albumin: 3.3 g/dL — ABNORMAL LOW (ref 3.4–5.0)
BUN: 45 mg/dL — ABNORMAL HIGH (ref 7–18)
Bilirubin,Total: 0.5 mg/dL (ref 0.2–1.0)
Calcium, Total: 8.6 mg/dL (ref 8.5–10.1)
Co2: 24 mmol/L (ref 21–32)
EGFR (Non-African Amer.): 15 — ABNORMAL LOW
SGPT (ALT): 36 U/L

## 2012-04-02 LAB — CBC CANCER CENTER
Basophil #: 0 x10 3/mm (ref 0.0–0.1)
Eosinophil #: 0.1 x10 3/mm (ref 0.0–0.7)
Lymphocyte #: 1.4 x10 3/mm (ref 1.0–3.6)
Lymphocyte %: 20 %
MCH: 33.2 pg (ref 26.0–34.0)
MCHC: 34.1 g/dL (ref 32.0–36.0)
Neutrophil %: 73.6 %
Platelet: 155 x10 3/mm (ref 150–440)
RBC: 3.28 10*6/uL — ABNORMAL LOW (ref 3.80–5.20)
WBC: 6.8 x10 3/mm (ref 3.6–11.0)

## 2012-04-05 DIAGNOSIS — C169 Malignant neoplasm of stomach, unspecified: Secondary | ICD-10-CM | POA: Diagnosis not present

## 2012-04-05 DIAGNOSIS — Z79899 Other long term (current) drug therapy: Secondary | ICD-10-CM | POA: Diagnosis not present

## 2012-04-05 LAB — CBC CANCER CENTER
Basophil #: 0 x10 3/mm (ref 0.0–0.1)
Basophil %: 0.5 %
Eosinophil %: 3.6 %
HGB: 10.2 g/dL — ABNORMAL LOW (ref 12.0–16.0)
MCV: 97 fL (ref 80–100)
Monocyte #: 0.3 x10 3/mm (ref 0.2–0.9)
Monocyte %: 6.7 %
Neutrophil %: 55.3 %
Platelet: 165 x10 3/mm (ref 150–440)
RBC: 3.16 10*6/uL — ABNORMAL LOW (ref 3.80–5.20)

## 2012-04-05 LAB — COMPREHENSIVE METABOLIC PANEL
Albumin: 3.4 g/dL (ref 3.4–5.0)
Alkaline Phosphatase: 60 U/L (ref 50–136)
Anion Gap: 10 (ref 7–16)
BUN: 18 mg/dL (ref 7–18)
Bilirubin,Total: 0.3 mg/dL (ref 0.2–1.0)
Calcium, Total: 8.1 mg/dL — ABNORMAL LOW (ref 8.5–10.1)
Chloride: 109 mmol/L — ABNORMAL HIGH (ref 98–107)
Creatinine: 1.14 mg/dL (ref 0.60–1.30)
EGFR (Non-African Amer.): 46 — ABNORMAL LOW
Glucose: 79 mg/dL (ref 65–99)
Osmolality: 290 (ref 275–301)
Potassium: 3.4 mmol/L — ABNORMAL LOW (ref 3.5–5.1)
SGOT(AST): 29 U/L (ref 15–37)
SGPT (ALT): 30 U/L

## 2012-04-19 ENCOUNTER — Ambulatory Visit: Payer: Self-pay | Admitting: Oncology

## 2012-04-26 DIAGNOSIS — E785 Hyperlipidemia, unspecified: Secondary | ICD-10-CM | POA: Diagnosis not present

## 2012-04-26 DIAGNOSIS — Z23 Encounter for immunization: Secondary | ICD-10-CM | POA: Diagnosis not present

## 2012-04-26 DIAGNOSIS — I10 Essential (primary) hypertension: Secondary | ICD-10-CM | POA: Diagnosis not present

## 2012-04-26 DIAGNOSIS — C494 Malignant neoplasm of connective and soft tissue of abdomen: Secondary | ICD-10-CM | POA: Diagnosis not present

## 2012-04-27 ENCOUNTER — Ambulatory Visit: Payer: Self-pay | Admitting: Pain Medicine

## 2012-04-27 DIAGNOSIS — R109 Unspecified abdominal pain: Secondary | ICD-10-CM | POA: Diagnosis not present

## 2012-04-27 DIAGNOSIS — IMO0002 Reserved for concepts with insufficient information to code with codable children: Secondary | ICD-10-CM | POA: Diagnosis not present

## 2012-04-27 DIAGNOSIS — Z86010 Personal history of colonic polyps: Secondary | ICD-10-CM | POA: Diagnosis not present

## 2012-04-27 DIAGNOSIS — Z79899 Other long term (current) drug therapy: Secondary | ICD-10-CM | POA: Diagnosis not present

## 2012-04-27 DIAGNOSIS — M545 Low back pain, unspecified: Secondary | ICD-10-CM | POA: Diagnosis not present

## 2012-04-27 DIAGNOSIS — M542 Cervicalgia: Secondary | ICD-10-CM | POA: Diagnosis not present

## 2012-04-27 DIAGNOSIS — B0229 Other postherpetic nervous system involvement: Secondary | ICD-10-CM | POA: Diagnosis not present

## 2012-04-27 DIAGNOSIS — M47817 Spondylosis without myelopathy or radiculopathy, lumbosacral region: Secondary | ICD-10-CM | POA: Diagnosis not present

## 2012-04-27 DIAGNOSIS — M79609 Pain in unspecified limb: Secondary | ICD-10-CM | POA: Diagnosis not present

## 2012-04-30 ENCOUNTER — Ambulatory Visit: Payer: Self-pay | Admitting: Oncology

## 2012-04-30 DIAGNOSIS — C169 Malignant neoplasm of stomach, unspecified: Secondary | ICD-10-CM | POA: Diagnosis not present

## 2012-04-30 DIAGNOSIS — Z9221 Personal history of antineoplastic chemotherapy: Secondary | ICD-10-CM | POA: Diagnosis not present

## 2012-04-30 DIAGNOSIS — Z9010 Acquired absence of unspecified breast and nipple: Secondary | ICD-10-CM | POA: Diagnosis not present

## 2012-04-30 DIAGNOSIS — Z79899 Other long term (current) drug therapy: Secondary | ICD-10-CM | POA: Diagnosis not present

## 2012-04-30 DIAGNOSIS — Z853 Personal history of malignant neoplasm of breast: Secondary | ICD-10-CM | POA: Diagnosis not present

## 2012-04-30 LAB — CBC CANCER CENTER
Basophil %: 0.3 %
Eosinophil %: 2.2 %
HGB: 9.9 g/dL — ABNORMAL LOW (ref 12.0–16.0)
Lymphocyte %: 26.9 %
Monocyte %: 4.1 %
Neutrophil %: 66.5 %
RBC: 2.96 10*6/uL — ABNORMAL LOW (ref 3.80–5.20)

## 2012-04-30 LAB — COMPREHENSIVE METABOLIC PANEL
Albumin: 3.6 g/dL (ref 3.4–5.0)
Alkaline Phosphatase: 95 U/L (ref 50–136)
Anion Gap: 13 (ref 7–16)
BUN: 17 mg/dL (ref 7–18)
Bilirubin,Total: 0.5 mg/dL (ref 0.2–1.0)
Calcium, Total: 9.1 mg/dL (ref 8.5–10.1)
Creatinine: 0.83 mg/dL (ref 0.60–1.30)
Glucose: 179 mg/dL — ABNORMAL HIGH (ref 65–99)
Potassium: 3.6 mmol/L (ref 3.5–5.1)
SGOT(AST): 23 U/L (ref 15–37)
Total Protein: 6.9 g/dL (ref 6.4–8.2)

## 2012-05-07 DIAGNOSIS — Z853 Personal history of malignant neoplasm of breast: Secondary | ICD-10-CM | POA: Diagnosis not present

## 2012-05-07 DIAGNOSIS — Z9010 Acquired absence of unspecified breast and nipple: Secondary | ICD-10-CM | POA: Diagnosis not present

## 2012-05-07 DIAGNOSIS — C169 Malignant neoplasm of stomach, unspecified: Secondary | ICD-10-CM | POA: Diagnosis not present

## 2012-05-07 DIAGNOSIS — Z79899 Other long term (current) drug therapy: Secondary | ICD-10-CM | POA: Diagnosis not present

## 2012-05-07 DIAGNOSIS — Z9221 Personal history of antineoplastic chemotherapy: Secondary | ICD-10-CM | POA: Diagnosis not present

## 2012-05-07 LAB — COMPREHENSIVE METABOLIC PANEL
Albumin: 3.7 g/dL (ref 3.4–5.0)
Alkaline Phosphatase: 114 U/L (ref 50–136)
Anion Gap: 7 (ref 7–16)
BUN: 19 mg/dL — ABNORMAL HIGH (ref 7–18)
Calcium, Total: 8.9 mg/dL (ref 8.5–10.1)
Chloride: 103 mmol/L (ref 98–107)
Creatinine: 1.15 mg/dL (ref 0.60–1.30)
EGFR (African American): 53 — ABNORMAL LOW
EGFR (Non-African Amer.): 46 — ABNORMAL LOW
Glucose: 228 mg/dL — ABNORMAL HIGH (ref 65–99)
Potassium: 3.6 mmol/L (ref 3.5–5.1)
Sodium: 139 mmol/L (ref 136–145)
Total Protein: 7 g/dL (ref 6.4–8.2)

## 2012-05-07 LAB — CBC CANCER CENTER
Basophil %: 0.5 %
Eosinophil %: 2.3 %
HCT: 30.7 % — ABNORMAL LOW (ref 35.0–47.0)
HGB: 10.3 g/dL — ABNORMAL LOW (ref 12.0–16.0)
Lymphocyte #: 1.2 x10 3/mm (ref 1.0–3.6)
Lymphocyte %: 23.2 %
MCH: 34 pg (ref 26.0–34.0)
MCV: 101 fL — ABNORMAL HIGH (ref 80–100)
Monocyte #: 0.2 x10 3/mm (ref 0.2–0.9)
Neutrophil #: 3.7 x10 3/mm (ref 1.4–6.5)
Platelet: 205 x10 3/mm (ref 150–440)
RBC: 3.03 10*6/uL — ABNORMAL LOW (ref 3.80–5.20)

## 2012-05-14 DIAGNOSIS — Z9221 Personal history of antineoplastic chemotherapy: Secondary | ICD-10-CM | POA: Diagnosis not present

## 2012-05-14 DIAGNOSIS — Z79899 Other long term (current) drug therapy: Secondary | ICD-10-CM | POA: Diagnosis not present

## 2012-05-14 DIAGNOSIS — Z853 Personal history of malignant neoplasm of breast: Secondary | ICD-10-CM | POA: Diagnosis not present

## 2012-05-14 DIAGNOSIS — C169 Malignant neoplasm of stomach, unspecified: Secondary | ICD-10-CM | POA: Diagnosis not present

## 2012-05-14 DIAGNOSIS — Z9010 Acquired absence of unspecified breast and nipple: Secondary | ICD-10-CM | POA: Diagnosis not present

## 2012-05-14 LAB — COMPREHENSIVE METABOLIC PANEL
Albumin: 3.4 g/dL (ref 3.4–5.0)
Alkaline Phosphatase: 111 U/L (ref 50–136)
Anion Gap: 7 (ref 7–16)
BUN: 10 mg/dL (ref 7–18)
Bilirubin,Total: 0.5 mg/dL (ref 0.2–1.0)
Calcium, Total: 8.4 mg/dL — ABNORMAL LOW (ref 8.5–10.1)
Creatinine: 0.88 mg/dL (ref 0.60–1.30)
EGFR (African American): >60
EGFR (Non-African Amer.): >60
Glucose: 145 mg/dL — ABNORMAL HIGH (ref 65–99)
Osmolality: 283 (ref 275–301)
Sodium: 141 mmol/L (ref 136–145)
Total Protein: 6.5 g/dL (ref 6.4–8.2)

## 2012-05-14 LAB — CBC CANCER CENTER
Basophil %: 0.6 %
Eosinophil #: 0.2 x10 3/mm (ref 0.0–0.7)
Eosinophil %: 3.2 %
HGB: 9.9 g/dL — ABNORMAL LOW (ref 12.0–16.0)
MCH: 34.1 pg — ABNORMAL HIGH (ref 26.0–34.0)
MCHC: 34 g/dL (ref 32.0–36.0)
MCV: 100 fL (ref 80–100)
Monocyte #: 0.2 x10 3/mm (ref 0.2–0.9)
Monocyte %: 4.3 %
Neutrophil %: 65.9 %
Platelet: 156 x10 3/mm (ref 150–440)
RBC: 2.91 10*6/uL — ABNORMAL LOW (ref 3.80–5.20)
WBC: 5 x10 3/mm (ref 3.6–11.0)

## 2012-05-18 DIAGNOSIS — C162 Malignant neoplasm of body of stomach: Secondary | ICD-10-CM | POA: Diagnosis not present

## 2012-05-20 ENCOUNTER — Ambulatory Visit: Payer: Self-pay | Admitting: Oncology

## 2012-05-20 DIAGNOSIS — Z79899 Other long term (current) drug therapy: Secondary | ICD-10-CM | POA: Diagnosis not present

## 2012-05-20 DIAGNOSIS — D6489 Other specified anemias: Secondary | ICD-10-CM | POA: Diagnosis not present

## 2012-05-20 DIAGNOSIS — C169 Malignant neoplasm of stomach, unspecified: Secondary | ICD-10-CM | POA: Diagnosis not present

## 2012-05-20 DIAGNOSIS — Z853 Personal history of malignant neoplasm of breast: Secondary | ICD-10-CM | POA: Diagnosis not present

## 2012-05-20 DIAGNOSIS — Z9010 Acquired absence of unspecified breast and nipple: Secondary | ICD-10-CM | POA: Diagnosis not present

## 2012-05-20 DIAGNOSIS — Z9221 Personal history of antineoplastic chemotherapy: Secondary | ICD-10-CM | POA: Diagnosis not present

## 2012-05-21 DIAGNOSIS — D6489 Other specified anemias: Secondary | ICD-10-CM | POA: Diagnosis not present

## 2012-05-21 DIAGNOSIS — Z853 Personal history of malignant neoplasm of breast: Secondary | ICD-10-CM | POA: Diagnosis not present

## 2012-05-21 DIAGNOSIS — C169 Malignant neoplasm of stomach, unspecified: Secondary | ICD-10-CM | POA: Diagnosis not present

## 2012-05-21 DIAGNOSIS — Z79899 Other long term (current) drug therapy: Secondary | ICD-10-CM | POA: Diagnosis not present

## 2012-05-21 LAB — COMPREHENSIVE METABOLIC PANEL
Alkaline Phosphatase: 81 U/L (ref 50–136)
Anion Gap: 8 (ref 7–16)
Bilirubin,Total: 0.5 mg/dL (ref 0.2–1.0)
Chloride: 106 mmol/L (ref 98–107)
Co2: 28 mmol/L (ref 21–32)
Creatinine: 0.94 mg/dL (ref 0.60–1.30)
EGFR (Non-African Amer.): 58 — ABNORMAL LOW
Osmolality: 288 (ref 275–301)
Potassium: 3.4 mmol/L — ABNORMAL LOW (ref 3.5–5.1)
Sodium: 142 mmol/L (ref 136–145)

## 2012-05-21 LAB — CBC CANCER CENTER
Basophil #: 0 x10 3/mm (ref 0.0–0.1)
Eosinophil #: 0.2 x10 3/mm (ref 0.0–0.7)
HCT: 27.3 % — ABNORMAL LOW (ref 35.0–47.0)
Lymphocyte #: 1.2 x10 3/mm (ref 1.0–3.6)
MCV: 101 fL — ABNORMAL HIGH (ref 80–100)
Monocyte %: 3.5 %
Neutrophil #: 2.6 x10 3/mm (ref 1.4–6.5)
Neutrophil %: 62.8 %
Platelet: 147 x10 3/mm — ABNORMAL LOW (ref 150–440)
RBC: 2.7 10*6/uL — ABNORMAL LOW (ref 3.80–5.20)
RDW: 15.8 % — ABNORMAL HIGH (ref 11.5–14.5)
WBC: 4.1 x10 3/mm (ref 3.6–11.0)

## 2012-05-24 ENCOUNTER — Ambulatory Visit: Payer: Self-pay | Admitting: General Surgery

## 2012-05-24 DIAGNOSIS — Z1231 Encounter for screening mammogram for malignant neoplasm of breast: Secondary | ICD-10-CM | POA: Diagnosis not present

## 2012-05-24 DIAGNOSIS — Z853 Personal history of malignant neoplasm of breast: Secondary | ICD-10-CM | POA: Diagnosis not present

## 2012-06-11 DIAGNOSIS — Z853 Personal history of malignant neoplasm of breast: Secondary | ICD-10-CM | POA: Diagnosis not present

## 2012-06-11 DIAGNOSIS — D6489 Other specified anemias: Secondary | ICD-10-CM | POA: Diagnosis not present

## 2012-06-11 DIAGNOSIS — C169 Malignant neoplasm of stomach, unspecified: Secondary | ICD-10-CM | POA: Diagnosis not present

## 2012-06-11 DIAGNOSIS — Z79899 Other long term (current) drug therapy: Secondary | ICD-10-CM | POA: Diagnosis not present

## 2012-06-11 LAB — CBC CANCER CENTER
Basophil #: 0 x10 3/mm (ref 0.0–0.1)
HCT: 27.6 % — ABNORMAL LOW (ref 35.0–47.0)
HGB: 9.4 g/dL — ABNORMAL LOW (ref 12.0–16.0)
Lymphocyte %: 30.4 %
MCV: 105 fL — ABNORMAL HIGH (ref 80–100)
Monocyte %: 4.1 %
Neutrophil #: 2.6 x10 3/mm (ref 1.4–6.5)
Platelet: 155 x10 3/mm (ref 150–440)
RBC: 2.63 10*6/uL — ABNORMAL LOW (ref 3.80–5.20)
RDW: 16 % — ABNORMAL HIGH (ref 11.5–14.5)

## 2012-06-11 LAB — COMPREHENSIVE METABOLIC PANEL
Alkaline Phosphatase: 99 U/L (ref 50–136)
Anion Gap: 4 — ABNORMAL LOW (ref 7–16)
BUN: 19 mg/dL — ABNORMAL HIGH (ref 7–18)
Bilirubin,Total: 0.4 mg/dL (ref 0.2–1.0)
Calcium, Total: 8.9 mg/dL (ref 8.5–10.1)
Chloride: 106 mmol/L (ref 98–107)
EGFR (African American): >60
EGFR (Non-African Amer.): 55 — ABNORMAL LOW
Glucose: 109 mg/dL — ABNORMAL HIGH (ref 65–99)
Osmolality: 284 (ref 275–301)
Potassium: 4.1 mmol/L (ref 3.5–5.1)
SGOT(AST): 28 U/L (ref 15–37)
SGPT (ALT): 28 U/L (ref 12–78)

## 2012-06-11 LAB — IRON AND TIBC: Iron Saturation: 23 %

## 2012-06-11 LAB — FERRITIN: Ferritin (ARMC): 89 ng/mL (ref 8–388)

## 2012-06-20 ENCOUNTER — Ambulatory Visit: Payer: Self-pay | Admitting: Oncology

## 2012-06-20 DIAGNOSIS — Z853 Personal history of malignant neoplasm of breast: Secondary | ICD-10-CM | POA: Diagnosis not present

## 2012-06-20 DIAGNOSIS — Z9221 Personal history of antineoplastic chemotherapy: Secondary | ICD-10-CM | POA: Diagnosis not present

## 2012-06-20 DIAGNOSIS — C169 Malignant neoplasm of stomach, unspecified: Secondary | ICD-10-CM | POA: Diagnosis not present

## 2012-06-20 DIAGNOSIS — Z9010 Acquired absence of unspecified breast and nipple: Secondary | ICD-10-CM | POA: Diagnosis not present

## 2012-06-20 DIAGNOSIS — D6489 Other specified anemias: Secondary | ICD-10-CM | POA: Diagnosis not present

## 2012-06-20 DIAGNOSIS — Z79899 Other long term (current) drug therapy: Secondary | ICD-10-CM | POA: Diagnosis not present

## 2012-06-23 DIAGNOSIS — C169 Malignant neoplasm of stomach, unspecified: Secondary | ICD-10-CM | POA: Diagnosis not present

## 2012-06-23 DIAGNOSIS — Z853 Personal history of malignant neoplasm of breast: Secondary | ICD-10-CM | POA: Diagnosis not present

## 2012-06-23 DIAGNOSIS — Z86010 Personal history of colonic polyps: Secondary | ICD-10-CM | POA: Diagnosis not present

## 2012-07-02 DIAGNOSIS — C169 Malignant neoplasm of stomach, unspecified: Secondary | ICD-10-CM | POA: Diagnosis not present

## 2012-07-02 DIAGNOSIS — D6489 Other specified anemias: Secondary | ICD-10-CM | POA: Diagnosis not present

## 2012-07-02 LAB — CBC CANCER CENTER
Basophil #: 0 x10 3/mm (ref 0.0–0.1)
Basophil %: 0.4 %
Eosinophil %: 3.7 %
HGB: 9.6 g/dL — ABNORMAL LOW (ref 12.0–16.0)
Lymphocyte %: 33.4 %
MCH: 35.6 pg — ABNORMAL HIGH (ref 26.0–34.0)
Neutrophil %: 54.3 %
Platelet: 111 x10 3/mm — ABNORMAL LOW (ref 150–440)
RBC: 2.71 10*6/uL — ABNORMAL LOW (ref 3.80–5.20)
RDW: 14.7 % — ABNORMAL HIGH (ref 11.5–14.5)
WBC: 3.5 x10 3/mm — ABNORMAL LOW (ref 3.6–11.0)

## 2012-07-02 LAB — COMPREHENSIVE METABOLIC PANEL
Albumin: 3.6 g/dL (ref 3.4–5.0)
Alkaline Phosphatase: 99 U/L (ref 50–136)
Anion Gap: 5 — ABNORMAL LOW (ref 7–16)
BUN: 18 mg/dL (ref 7–18)
Calcium, Total: 8.8 mg/dL (ref 8.5–10.1)
Creatinine: 1.07 mg/dL (ref 0.60–1.30)
Glucose: 102 mg/dL — ABNORMAL HIGH (ref 65–99)
Osmolality: 283 (ref 275–301)
Potassium: 3.7 mmol/L (ref 3.5–5.1)
SGOT(AST): 70 U/L — ABNORMAL HIGH (ref 15–37)
SGPT (ALT): 82 U/L — ABNORMAL HIGH (ref 12–78)
Sodium: 141 mmol/L (ref 136–145)
Total Protein: 6.3 g/dL — ABNORMAL LOW (ref 6.4–8.2)

## 2012-07-20 ENCOUNTER — Ambulatory Visit: Payer: Self-pay | Admitting: Oncology

## 2012-07-22 ENCOUNTER — Ambulatory Visit: Payer: Self-pay | Admitting: Pain Medicine

## 2012-07-22 DIAGNOSIS — M545 Low back pain, unspecified: Secondary | ICD-10-CM | POA: Diagnosis not present

## 2012-07-22 DIAGNOSIS — M47817 Spondylosis without myelopathy or radiculopathy, lumbosacral region: Secondary | ICD-10-CM | POA: Diagnosis not present

## 2012-07-22 DIAGNOSIS — M79609 Pain in unspecified limb: Secondary | ICD-10-CM | POA: Diagnosis not present

## 2012-07-22 DIAGNOSIS — B0229 Other postherpetic nervous system involvement: Secondary | ICD-10-CM | POA: Diagnosis not present

## 2012-07-22 DIAGNOSIS — Z79899 Other long term (current) drug therapy: Secondary | ICD-10-CM | POA: Diagnosis not present

## 2012-07-22 DIAGNOSIS — M531 Cervicobrachial syndrome: Secondary | ICD-10-CM | POA: Diagnosis not present

## 2012-07-22 DIAGNOSIS — M542 Cervicalgia: Secondary | ICD-10-CM | POA: Diagnosis not present

## 2012-07-22 DIAGNOSIS — IMO0002 Reserved for concepts with insufficient information to code with codable children: Secondary | ICD-10-CM | POA: Diagnosis not present

## 2012-07-22 DIAGNOSIS — R51 Headache: Secondary | ICD-10-CM | POA: Diagnosis not present

## 2012-07-23 ENCOUNTER — Ambulatory Visit: Payer: Self-pay | Admitting: Oncology

## 2012-07-23 DIAGNOSIS — C169 Malignant neoplasm of stomach, unspecified: Secondary | ICD-10-CM | POA: Diagnosis not present

## 2012-07-23 DIAGNOSIS — I10 Essential (primary) hypertension: Secondary | ICD-10-CM | POA: Diagnosis not present

## 2012-07-23 DIAGNOSIS — Z853 Personal history of malignant neoplasm of breast: Secondary | ICD-10-CM | POA: Diagnosis not present

## 2012-07-23 DIAGNOSIS — D6489 Other specified anemias: Secondary | ICD-10-CM | POA: Diagnosis not present

## 2012-07-23 DIAGNOSIS — Z23 Encounter for immunization: Secondary | ICD-10-CM | POA: Diagnosis not present

## 2012-07-23 DIAGNOSIS — E119 Type 2 diabetes mellitus without complications: Secondary | ICD-10-CM | POA: Diagnosis not present

## 2012-07-23 DIAGNOSIS — Z9010 Acquired absence of unspecified breast and nipple: Secondary | ICD-10-CM | POA: Diagnosis not present

## 2012-07-23 DIAGNOSIS — Z9221 Personal history of antineoplastic chemotherapy: Secondary | ICD-10-CM | POA: Diagnosis not present

## 2012-07-23 DIAGNOSIS — Z79899 Other long term (current) drug therapy: Secondary | ICD-10-CM | POA: Diagnosis not present

## 2012-07-30 DIAGNOSIS — Z23 Encounter for immunization: Secondary | ICD-10-CM | POA: Diagnosis not present

## 2012-07-30 DIAGNOSIS — Z79899 Other long term (current) drug therapy: Secondary | ICD-10-CM | POA: Diagnosis not present

## 2012-07-30 DIAGNOSIS — D6489 Other specified anemias: Secondary | ICD-10-CM | POA: Diagnosis not present

## 2012-07-30 DIAGNOSIS — Z853 Personal history of malignant neoplasm of breast: Secondary | ICD-10-CM | POA: Diagnosis not present

## 2012-07-30 DIAGNOSIS — I10 Essential (primary) hypertension: Secondary | ICD-10-CM | POA: Diagnosis not present

## 2012-07-30 DIAGNOSIS — C169 Malignant neoplasm of stomach, unspecified: Secondary | ICD-10-CM | POA: Diagnosis not present

## 2012-07-30 LAB — COMPREHENSIVE METABOLIC PANEL
Albumin: 3.6 g/dL (ref 3.4–5.0)
Alkaline Phosphatase: 116 U/L (ref 50–136)
Anion Gap: 9 (ref 7–16)
BUN: 17 mg/dL (ref 7–18)
Calcium, Total: 8.9 mg/dL (ref 8.5–10.1)
Chloride: 105 mmol/L (ref 98–107)
Creatinine: 0.8 mg/dL (ref 0.60–1.30)
EGFR (African American): >60
EGFR (Non-African Amer.): >60
Glucose: 161 mg/dL — ABNORMAL HIGH (ref 65–99)
Osmolality: 288 (ref 275–301)
Sodium: 142 mmol/L (ref 136–145)
Total Protein: 6.6 g/dL (ref 6.4–8.2)

## 2012-07-30 LAB — CBC CANCER CENTER
Basophil #: 0 x10 3/mm (ref 0.0–0.1)
Eosinophil #: 0.2 x10 3/mm (ref 0.0–0.7)
HGB: 10 g/dL — ABNORMAL LOW (ref 12.0–16.0)
Lymphocyte #: 1 x10 3/mm (ref 1.0–3.6)
Lymphocyte %: 24.4 %
MCH: 35.9 pg — ABNORMAL HIGH (ref 26.0–34.0)
MCHC: 33.7 g/dL (ref 32.0–36.0)
Monocyte #: 0.1 x10 3/mm — ABNORMAL LOW (ref 0.2–0.9)
Neutrophil %: 66.7 %
Platelet: 153 x10 3/mm (ref 150–440)
RDW: 14.2 % (ref 11.5–14.5)
WBC: 4.2 x10 3/mm (ref 3.6–11.0)

## 2012-08-18 DIAGNOSIS — C162 Malignant neoplasm of body of stomach: Secondary | ICD-10-CM | POA: Diagnosis not present

## 2012-08-20 ENCOUNTER — Ambulatory Visit: Payer: Self-pay | Admitting: Oncology

## 2012-08-20 DIAGNOSIS — I10 Essential (primary) hypertension: Secondary | ICD-10-CM | POA: Diagnosis not present

## 2012-08-20 DIAGNOSIS — Z9010 Acquired absence of unspecified breast and nipple: Secondary | ICD-10-CM | POA: Diagnosis not present

## 2012-08-20 DIAGNOSIS — Z853 Personal history of malignant neoplasm of breast: Secondary | ICD-10-CM | POA: Diagnosis not present

## 2012-08-20 DIAGNOSIS — C169 Malignant neoplasm of stomach, unspecified: Secondary | ICD-10-CM | POA: Diagnosis not present

## 2012-08-20 DIAGNOSIS — E119 Type 2 diabetes mellitus without complications: Secondary | ICD-10-CM | POA: Diagnosis not present

## 2012-08-20 DIAGNOSIS — D6489 Other specified anemias: Secondary | ICD-10-CM | POA: Diagnosis not present

## 2012-08-20 DIAGNOSIS — Z9221 Personal history of antineoplastic chemotherapy: Secondary | ICD-10-CM | POA: Diagnosis not present

## 2012-08-20 DIAGNOSIS — Z7982 Long term (current) use of aspirin: Secondary | ICD-10-CM | POA: Diagnosis not present

## 2012-08-20 DIAGNOSIS — Z79899 Other long term (current) drug therapy: Secondary | ICD-10-CM | POA: Diagnosis not present

## 2012-08-31 DIAGNOSIS — K2960 Other gastritis without bleeding: Secondary | ICD-10-CM | POA: Diagnosis not present

## 2012-08-31 DIAGNOSIS — K319 Disease of stomach and duodenum, unspecified: Secondary | ICD-10-CM | POA: Diagnosis not present

## 2012-08-31 DIAGNOSIS — D490 Neoplasm of unspecified behavior of digestive system: Secondary | ICD-10-CM | POA: Diagnosis not present

## 2012-08-31 DIAGNOSIS — K2940 Chronic atrophic gastritis without bleeding: Secondary | ICD-10-CM | POA: Diagnosis not present

## 2012-09-03 DIAGNOSIS — Z79899 Other long term (current) drug therapy: Secondary | ICD-10-CM | POA: Diagnosis not present

## 2012-09-03 DIAGNOSIS — C169 Malignant neoplasm of stomach, unspecified: Secondary | ICD-10-CM | POA: Diagnosis not present

## 2012-09-03 DIAGNOSIS — D6489 Other specified anemias: Secondary | ICD-10-CM | POA: Diagnosis not present

## 2012-09-03 LAB — CBC CANCER CENTER
Basophil #: 0 x10 3/mm (ref 0.0–0.1)
Basophil %: 0.5 %
Eosinophil #: 0.1 x10 3/mm (ref 0.0–0.7)
HCT: 30.3 % — ABNORMAL LOW (ref 35.0–47.0)
HGB: 10.4 g/dL — ABNORMAL LOW (ref 12.0–16.0)
Lymphocyte %: 28.8 %
MCV: 105 fL — ABNORMAL HIGH (ref 80–100)
Monocyte #: 0.3 x10 3/mm (ref 0.2–0.9)
Monocyte %: 8 %
Neutrophil %: 58.7 %
Platelet: 174 x10 3/mm (ref 150–440)
RBC: 2.88 10*6/uL — ABNORMAL LOW (ref 3.80–5.20)
WBC: 3.2 x10 3/mm — ABNORMAL LOW (ref 3.6–11.0)

## 2012-09-03 LAB — COMPREHENSIVE METABOLIC PANEL
Albumin: 3.9 g/dL (ref 3.4–5.0)
BUN: 18 mg/dL (ref 7–18)
Bilirubin,Total: 0.4 mg/dL (ref 0.2–1.0)
Chloride: 106 mmol/L (ref 98–107)
Co2: 27 mmol/L (ref 21–32)
Creatinine: 0.93 mg/dL (ref 0.60–1.30)
EGFR (African American): >60
EGFR (Non-African Amer.): 58 — ABNORMAL LOW
Osmolality: 292 (ref 275–301)
SGOT(AST): 29 U/L (ref 15–37)
SGPT (ALT): 28 U/L (ref 12–78)
Sodium: 144 mmol/L (ref 136–145)
Total Protein: 7.2 g/dL (ref 6.4–8.2)

## 2012-09-06 DIAGNOSIS — C494 Malignant neoplasm of connective and soft tissue of abdomen: Secondary | ICD-10-CM | POA: Diagnosis not present

## 2012-09-06 DIAGNOSIS — I10 Essential (primary) hypertension: Secondary | ICD-10-CM | POA: Diagnosis not present

## 2012-09-06 DIAGNOSIS — E785 Hyperlipidemia, unspecified: Secondary | ICD-10-CM | POA: Diagnosis not present

## 2012-09-19 ENCOUNTER — Ambulatory Visit: Payer: Self-pay | Admitting: Oncology

## 2012-09-19 DIAGNOSIS — Z9221 Personal history of antineoplastic chemotherapy: Secondary | ICD-10-CM | POA: Diagnosis not present

## 2012-09-19 DIAGNOSIS — Z853 Personal history of malignant neoplasm of breast: Secondary | ICD-10-CM | POA: Diagnosis not present

## 2012-09-19 DIAGNOSIS — Z9010 Acquired absence of unspecified breast and nipple: Secondary | ICD-10-CM | POA: Diagnosis not present

## 2012-09-19 DIAGNOSIS — E119 Type 2 diabetes mellitus without complications: Secondary | ICD-10-CM | POA: Diagnosis not present

## 2012-09-19 DIAGNOSIS — D6489 Other specified anemias: Secondary | ICD-10-CM | POA: Diagnosis not present

## 2012-09-19 DIAGNOSIS — I10 Essential (primary) hypertension: Secondary | ICD-10-CM | POA: Diagnosis not present

## 2012-09-19 DIAGNOSIS — C169 Malignant neoplasm of stomach, unspecified: Secondary | ICD-10-CM | POA: Diagnosis not present

## 2012-09-19 DIAGNOSIS — Z79899 Other long term (current) drug therapy: Secondary | ICD-10-CM | POA: Diagnosis not present

## 2012-09-28 DIAGNOSIS — D131 Benign neoplasm of stomach: Secondary | ICD-10-CM | POA: Diagnosis not present

## 2012-10-01 DIAGNOSIS — Z79899 Other long term (current) drug therapy: Secondary | ICD-10-CM | POA: Diagnosis not present

## 2012-10-01 DIAGNOSIS — Z853 Personal history of malignant neoplasm of breast: Secondary | ICD-10-CM | POA: Diagnosis not present

## 2012-10-01 DIAGNOSIS — C169 Malignant neoplasm of stomach, unspecified: Secondary | ICD-10-CM | POA: Diagnosis not present

## 2012-10-01 LAB — CBC CANCER CENTER
Basophil %: 0.4 %
Eosinophil #: 0.1 x10 3/mm (ref 0.0–0.7)
Eosinophil %: 3.3 %
HCT: 28.8 % — ABNORMAL LOW (ref 35.0–47.0)
Lymphocyte #: 1 x10 3/mm (ref 1.0–3.6)
Lymphocyte %: 26.7 %
MCHC: 34.6 g/dL (ref 32.0–36.0)
Monocyte #: 0.2 x10 3/mm (ref 0.2–0.9)
Neutrophil %: 62.9 %
RBC: 2.79 10*6/uL — ABNORMAL LOW (ref 3.80–5.20)
RDW: 13.3 % (ref 11.5–14.5)

## 2012-10-01 LAB — COMPREHENSIVE METABOLIC PANEL
Alkaline Phosphatase: 74 U/L (ref 50–136)
Bilirubin,Total: 0.4 mg/dL (ref 0.2–1.0)
Calcium, Total: 8.7 mg/dL (ref 8.5–10.1)
Chloride: 108 mmol/L — ABNORMAL HIGH (ref 98–107)
Co2: 28 mmol/L (ref 21–32)
Creatinine: 0.91 mg/dL (ref 0.60–1.30)
EGFR (Non-African Amer.): >60
Osmolality: 291 (ref 275–301)
SGPT (ALT): 24 U/L (ref 12–78)
Sodium: 144 mmol/L (ref 136–145)
Total Protein: 6.7 g/dL (ref 6.4–8.2)

## 2012-10-20 ENCOUNTER — Ambulatory Visit: Payer: Self-pay | Admitting: Oncology

## 2012-10-20 DIAGNOSIS — Z853 Personal history of malignant neoplasm of breast: Secondary | ICD-10-CM | POA: Diagnosis not present

## 2012-10-20 DIAGNOSIS — Z9221 Personal history of antineoplastic chemotherapy: Secondary | ICD-10-CM | POA: Diagnosis not present

## 2012-10-20 DIAGNOSIS — C169 Malignant neoplasm of stomach, unspecified: Secondary | ICD-10-CM | POA: Diagnosis not present

## 2012-10-20 DIAGNOSIS — Z79899 Other long term (current) drug therapy: Secondary | ICD-10-CM | POA: Diagnosis not present

## 2012-10-20 DIAGNOSIS — Z9010 Acquired absence of unspecified breast and nipple: Secondary | ICD-10-CM | POA: Diagnosis not present

## 2012-10-20 DIAGNOSIS — D6489 Other specified anemias: Secondary | ICD-10-CM | POA: Diagnosis not present

## 2012-10-26 ENCOUNTER — Ambulatory Visit: Payer: Self-pay | Admitting: Pain Medicine

## 2012-10-26 DIAGNOSIS — M47817 Spondylosis without myelopathy or radiculopathy, lumbosacral region: Secondary | ICD-10-CM | POA: Diagnosis not present

## 2012-10-26 DIAGNOSIS — M545 Low back pain, unspecified: Secondary | ICD-10-CM | POA: Diagnosis not present

## 2012-10-26 DIAGNOSIS — M542 Cervicalgia: Secondary | ICD-10-CM | POA: Diagnosis not present

## 2012-10-26 DIAGNOSIS — R51 Headache: Secondary | ICD-10-CM | POA: Diagnosis not present

## 2012-10-26 DIAGNOSIS — M79609 Pain in unspecified limb: Secondary | ICD-10-CM | POA: Diagnosis not present

## 2012-10-26 DIAGNOSIS — Z79899 Other long term (current) drug therapy: Secondary | ICD-10-CM | POA: Diagnosis not present

## 2012-10-26 DIAGNOSIS — IMO0002 Reserved for concepts with insufficient information to code with codable children: Secondary | ICD-10-CM | POA: Diagnosis not present

## 2012-11-19 DIAGNOSIS — C169 Malignant neoplasm of stomach, unspecified: Secondary | ICD-10-CM | POA: Diagnosis not present

## 2012-11-19 DIAGNOSIS — Z79899 Other long term (current) drug therapy: Secondary | ICD-10-CM | POA: Diagnosis not present

## 2012-11-19 DIAGNOSIS — D6489 Other specified anemias: Secondary | ICD-10-CM | POA: Diagnosis not present

## 2012-11-19 LAB — CBC CANCER CENTER
Basophil #: 0 x10 3/mm (ref 0.0–0.1)
Eosinophil %: 3.3 %
HGB: 10.6 g/dL — ABNORMAL LOW (ref 12.0–16.0)
Lymphocyte #: 1.3 x10 3/mm (ref 1.0–3.6)
Lymphocyte %: 30.1 %
MCH: 35.5 pg — ABNORMAL HIGH (ref 26.0–34.0)
MCHC: 34.4 g/dL (ref 32.0–36.0)
Monocyte %: 5.8 %
Neutrophil %: 60.4 %
Platelet: 148 x10 3/mm — ABNORMAL LOW (ref 150–440)
RBC: 3 10*6/uL — ABNORMAL LOW (ref 3.80–5.20)
WBC: 4.4 x10 3/mm (ref 3.6–11.0)

## 2012-11-19 LAB — COMPREHENSIVE METABOLIC PANEL
Albumin: 3.7 g/dL (ref 3.4–5.0)
Alkaline Phosphatase: 100 U/L (ref 50–136)
Anion Gap: 10 (ref 7–16)
BUN: 13 mg/dL (ref 7–18)
Chloride: 105 mmol/L (ref 98–107)
EGFR (African American): >60
EGFR (Non-African Amer.): >60
Osmolality: 286 (ref 275–301)
Potassium: 3.7 mmol/L (ref 3.5–5.1)
SGOT(AST): 24 U/L (ref 15–37)
SGPT (ALT): 25 U/L (ref 12–78)
Sodium: 143 mmol/L (ref 136–145)

## 2012-11-20 ENCOUNTER — Ambulatory Visit: Payer: Self-pay | Admitting: Oncology

## 2013-01-06 DIAGNOSIS — E119 Type 2 diabetes mellitus without complications: Secondary | ICD-10-CM | POA: Diagnosis not present

## 2013-01-06 DIAGNOSIS — M810 Age-related osteoporosis without current pathological fracture: Secondary | ICD-10-CM | POA: Diagnosis not present

## 2013-01-06 DIAGNOSIS — I10 Essential (primary) hypertension: Secondary | ICD-10-CM | POA: Diagnosis not present

## 2013-01-06 DIAGNOSIS — E785 Hyperlipidemia, unspecified: Secondary | ICD-10-CM | POA: Diagnosis not present

## 2013-01-07 ENCOUNTER — Ambulatory Visit: Payer: Self-pay | Admitting: Oncology

## 2013-01-07 DIAGNOSIS — Z7982 Long term (current) use of aspirin: Secondary | ICD-10-CM | POA: Diagnosis not present

## 2013-01-07 DIAGNOSIS — C494 Malignant neoplasm of connective and soft tissue of abdomen: Secondary | ICD-10-CM | POA: Diagnosis not present

## 2013-01-07 DIAGNOSIS — E119 Type 2 diabetes mellitus without complications: Secondary | ICD-10-CM | POA: Diagnosis not present

## 2013-01-07 DIAGNOSIS — I10 Essential (primary) hypertension: Secondary | ICD-10-CM | POA: Diagnosis not present

## 2013-01-07 DIAGNOSIS — Z90710 Acquired absence of both cervix and uterus: Secondary | ICD-10-CM | POA: Diagnosis not present

## 2013-01-07 DIAGNOSIS — D649 Anemia, unspecified: Secondary | ICD-10-CM | POA: Diagnosis not present

## 2013-01-07 DIAGNOSIS — R110 Nausea: Secondary | ICD-10-CM | POA: Diagnosis not present

## 2013-01-07 DIAGNOSIS — Z79899 Other long term (current) drug therapy: Secondary | ICD-10-CM | POA: Diagnosis not present

## 2013-01-07 LAB — CBC CANCER CENTER
Basophil #: 0 x10 3/mm (ref 0.0–0.1)
Basophil %: 0.4 %
Eosinophil #: 0.2 x10 3/mm (ref 0.0–0.7)
Eosinophil %: 3 %
HCT: 32.9 % — ABNORMAL LOW (ref 35.0–47.0)
Lymphocyte #: 1.4 x10 3/mm (ref 1.0–3.6)
Lymphocyte %: 26.9 %
MCHC: 34.2 g/dL (ref 32.0–36.0)
Monocyte #: 0.2 x10 3/mm (ref 0.2–0.9)
Monocyte %: 4.5 %
Neutrophil %: 65.2 %
Platelet: 174 x10 3/mm (ref 150–440)
RBC: 3.23 10*6/uL — ABNORMAL LOW (ref 3.80–5.20)

## 2013-01-07 LAB — COMPREHENSIVE METABOLIC PANEL
Albumin: 3.7 g/dL (ref 3.4–5.0)
Anion Gap: 11 (ref 7–16)
BUN: 16 mg/dL (ref 7–18)
Calcium, Total: 8.6 mg/dL (ref 8.5–10.1)
Chloride: 102 mmol/L (ref 98–107)
EGFR (African American): >60
Glucose: 151 mg/dL — ABNORMAL HIGH (ref 65–99)
Osmolality: 285 (ref 275–301)
Potassium: 3.3 mmol/L — ABNORMAL LOW (ref 3.5–5.1)
SGOT(AST): 28 U/L (ref 15–37)
SGPT (ALT): 28 U/L (ref 12–78)
Total Protein: 6.8 g/dL (ref 6.4–8.2)

## 2013-01-13 ENCOUNTER — Ambulatory Visit: Payer: Self-pay | Admitting: Family Medicine

## 2013-01-13 DIAGNOSIS — M810 Age-related osteoporosis without current pathological fracture: Secondary | ICD-10-CM | POA: Diagnosis not present

## 2013-01-18 ENCOUNTER — Ambulatory Visit: Payer: Self-pay | Admitting: Oncology

## 2013-01-20 ENCOUNTER — Ambulatory Visit: Payer: Self-pay | Admitting: Pain Medicine

## 2013-01-20 DIAGNOSIS — Z853 Personal history of malignant neoplasm of breast: Secondary | ICD-10-CM | POA: Diagnosis not present

## 2013-01-20 DIAGNOSIS — M79609 Pain in unspecified limb: Secondary | ICD-10-CM | POA: Diagnosis not present

## 2013-01-20 DIAGNOSIS — IMO0002 Reserved for concepts with insufficient information to code with codable children: Secondary | ICD-10-CM | POA: Diagnosis not present

## 2013-01-20 DIAGNOSIS — Z79899 Other long term (current) drug therapy: Secondary | ICD-10-CM | POA: Diagnosis not present

## 2013-01-20 DIAGNOSIS — B0229 Other postherpetic nervous system involvement: Secondary | ICD-10-CM | POA: Diagnosis not present

## 2013-01-20 DIAGNOSIS — M542 Cervicalgia: Secondary | ICD-10-CM | POA: Diagnosis not present

## 2013-01-20 DIAGNOSIS — M47817 Spondylosis without myelopathy or radiculopathy, lumbosacral region: Secondary | ICD-10-CM | POA: Diagnosis not present

## 2013-01-21 ENCOUNTER — Ambulatory Visit: Payer: Self-pay | Admitting: Oncology

## 2013-03-17 ENCOUNTER — Ambulatory Visit: Payer: Self-pay | Admitting: Family Medicine

## 2013-03-17 DIAGNOSIS — S139XXA Sprain of joints and ligaments of unspecified parts of neck, initial encounter: Secondary | ICD-10-CM | POA: Diagnosis not present

## 2013-03-18 ENCOUNTER — Ambulatory Visit: Payer: Self-pay | Admitting: Oncology

## 2013-03-18 ENCOUNTER — Encounter: Payer: Self-pay | Admitting: *Deleted

## 2013-03-18 DIAGNOSIS — C494 Malignant neoplasm of connective and soft tissue of abdomen: Secondary | ICD-10-CM | POA: Diagnosis not present

## 2013-03-18 DIAGNOSIS — R110 Nausea: Secondary | ICD-10-CM | POA: Diagnosis not present

## 2013-03-18 DIAGNOSIS — Z7982 Long term (current) use of aspirin: Secondary | ICD-10-CM | POA: Diagnosis not present

## 2013-03-18 DIAGNOSIS — I10 Essential (primary) hypertension: Secondary | ICD-10-CM | POA: Diagnosis not present

## 2013-03-18 DIAGNOSIS — Z79899 Other long term (current) drug therapy: Secondary | ICD-10-CM | POA: Diagnosis not present

## 2013-03-18 DIAGNOSIS — Z90710 Acquired absence of both cervix and uterus: Secondary | ICD-10-CM | POA: Diagnosis not present

## 2013-03-18 DIAGNOSIS — E119 Type 2 diabetes mellitus without complications: Secondary | ICD-10-CM | POA: Diagnosis not present

## 2013-03-18 DIAGNOSIS — Z86010 Personal history of colonic polyps: Secondary | ICD-10-CM | POA: Insufficient documentation

## 2013-03-18 LAB — COMPREHENSIVE METABOLIC PANEL
Albumin: 3.8 g/dL (ref 3.4–5.0)
BUN: 19 mg/dL — ABNORMAL HIGH (ref 7–18)
Bilirubin,Total: 0.6 mg/dL (ref 0.2–1.0)
EGFR (African American): >60
EGFR (Non-African Amer.): 56 — ABNORMAL LOW
Osmolality: 288 (ref 275–301)
Potassium: 3.8 mmol/L (ref 3.5–5.1)
SGOT(AST): 25 U/L (ref 15–37)
Sodium: 142 mmol/L (ref 136–145)
Total Protein: 7.1 g/dL (ref 6.4–8.2)

## 2013-03-18 LAB — CBC CANCER CENTER
Basophil #: 0 x10 3/mm (ref 0.0–0.1)
Basophil %: 0.4 %
Eosinophil #: 0.2 x10 3/mm (ref 0.0–0.7)
HCT: 34.1 % — ABNORMAL LOW (ref 35.0–47.0)
Lymphocyte %: 17.7 %
MCH: 33.9 pg (ref 26.0–34.0)
MCV: 102 fL — ABNORMAL HIGH (ref 80–100)
Monocyte #: 0.4 x10 3/mm (ref 0.2–0.9)
Monocyte %: 7 %
Neutrophil #: 4.3 x10 3/mm (ref 1.4–6.5)
Neutrophil %: 72.3 %
Platelet: 188 x10 3/mm (ref 150–440)
RBC: 3.34 10*6/uL — ABNORMAL LOW (ref 3.80–5.20)
RDW: 13.7 % (ref 11.5–14.5)
WBC: 6 x10 3/mm (ref 3.6–11.0)

## 2013-03-20 ENCOUNTER — Ambulatory Visit: Payer: Self-pay | Admitting: Oncology

## 2013-04-19 ENCOUNTER — Ambulatory Visit: Payer: Self-pay | Admitting: Pain Medicine

## 2013-04-19 DIAGNOSIS — M47817 Spondylosis without myelopathy or radiculopathy, lumbosacral region: Secondary | ICD-10-CM | POA: Diagnosis not present

## 2013-04-19 DIAGNOSIS — Z79899 Other long term (current) drug therapy: Secondary | ICD-10-CM | POA: Diagnosis not present

## 2013-04-19 DIAGNOSIS — M542 Cervicalgia: Secondary | ICD-10-CM | POA: Diagnosis not present

## 2013-04-19 DIAGNOSIS — B0222 Postherpetic trigeminal neuralgia: Secondary | ICD-10-CM | POA: Diagnosis not present

## 2013-04-19 DIAGNOSIS — M79609 Pain in unspecified limb: Secondary | ICD-10-CM | POA: Diagnosis not present

## 2013-04-19 DIAGNOSIS — M5030 Other cervical disc degeneration, unspecified cervical region: Secondary | ICD-10-CM | POA: Diagnosis not present

## 2013-04-19 DIAGNOSIS — IMO0002 Reserved for concepts with insufficient information to code with codable children: Secondary | ICD-10-CM | POA: Diagnosis not present

## 2013-05-10 DIAGNOSIS — E119 Type 2 diabetes mellitus without complications: Secondary | ICD-10-CM | POA: Diagnosis not present

## 2013-05-10 DIAGNOSIS — Z1211 Encounter for screening for malignant neoplasm of colon: Secondary | ICD-10-CM | POA: Diagnosis not present

## 2013-05-10 DIAGNOSIS — E785 Hyperlipidemia, unspecified: Secondary | ICD-10-CM | POA: Diagnosis not present

## 2013-05-10 DIAGNOSIS — I10 Essential (primary) hypertension: Secondary | ICD-10-CM | POA: Diagnosis not present

## 2013-06-22 ENCOUNTER — Ambulatory Visit: Payer: Self-pay | Admitting: General Surgery

## 2013-06-22 DIAGNOSIS — Z1231 Encounter for screening mammogram for malignant neoplasm of breast: Secondary | ICD-10-CM | POA: Diagnosis not present

## 2013-06-22 DIAGNOSIS — Z9010 Acquired absence of unspecified breast and nipple: Secondary | ICD-10-CM | POA: Diagnosis not present

## 2013-06-22 DIAGNOSIS — R928 Other abnormal and inconclusive findings on diagnostic imaging of breast: Secondary | ICD-10-CM | POA: Diagnosis not present

## 2013-06-22 DIAGNOSIS — Z853 Personal history of malignant neoplasm of breast: Secondary | ICD-10-CM | POA: Diagnosis not present

## 2013-06-23 ENCOUNTER — Encounter: Payer: Self-pay | Admitting: General Surgery

## 2013-06-28 ENCOUNTER — Ambulatory Visit: Payer: Self-pay | Admitting: General Surgery

## 2013-06-28 DIAGNOSIS — R928 Other abnormal and inconclusive findings on diagnostic imaging of breast: Secondary | ICD-10-CM | POA: Diagnosis not present

## 2013-06-28 DIAGNOSIS — N63 Unspecified lump in unspecified breast: Secondary | ICD-10-CM | POA: Diagnosis not present

## 2013-06-30 ENCOUNTER — Encounter: Payer: Self-pay | Admitting: General Surgery

## 2013-07-04 ENCOUNTER — Ambulatory Visit (INDEPENDENT_AMBULATORY_CARE_PROVIDER_SITE_OTHER): Payer: Medicare Other | Admitting: General Surgery

## 2013-07-04 ENCOUNTER — Encounter: Payer: Self-pay | Admitting: General Surgery

## 2013-07-04 VITALS — BP 130/72 | HR 68 | Resp 13 | Ht 64.0 in | Wt 131.0 lb

## 2013-07-04 DIAGNOSIS — Z85028 Personal history of other malignant neoplasm of stomach: Secondary | ICD-10-CM

## 2013-07-04 DIAGNOSIS — Z86010 Personal history of colonic polyps: Secondary | ICD-10-CM

## 2013-07-04 DIAGNOSIS — Z853 Personal history of malignant neoplasm of breast: Secondary | ICD-10-CM | POA: Insufficient documentation

## 2013-07-04 MED ORDER — POLYETHYLENE GLYCOL 3350 17 GM/SCOOP PO POWD: ORAL | Status: DC

## 2013-07-04 NOTE — Patient Instructions (Addendum)
Colonoscopy A colonoscopy is an exam to evaluate your entire colon. In this exam, your colon is cleansed. A long fiberoptic tube is inserted through your rectum and into your colon. The fiberoptic scope (endoscope) is a long bundle of enclosed and very flexible fibers. These fibers transmit light to the area examined and send images from that area to your caregiver. Discomfort is usually minimal. You may be given a drug to help you sleep (sedative) during or prior to the procedure. This exam helps to detect lumps (tumors), polyps, inflammation, and areas of bleeding. Your caregiver may also take a small piece of tissue (biopsy) that will be examined under a microscope. LET YOUR CAREGIVER KNOW ABOUT:   Allergies to food or medicine.  Medicines taken, including vitamins, herbs, eyedrops, over-the-counter medicines, and creams.  Use of steroids (by mouth or creams).  Previous problems with anesthetics or numbing medicines.  History of bleeding problems or blood clots.  Previous surgery.  Other health problems, including diabetes and kidney problems.  Possibility of pregnancy, if this applies. BEFORE THE PROCEDURE   A clear liquid diet may be required for 2 days before the exam.  Ask your caregiver about changing or stopping your regular medications.  Liquid injections (enemas) or laxatives may be required.  A large amount of electrolyte solution may be given to you to drink over a short period of time. This solution is used to clean out your colon.  You should be present 60 minutes prior to your procedure or as directed by your caregiver. AFTER THE PROCEDURE   If you received a sedative or pain relieving medication, you will need to arrange for someone to drive you home.  Occasionally, there is a little blood passed with the first bowel movement. Do not be concerned. FINDING OUT THE RESULTS OF YOUR TEST Not all test results are available during your visit. If your test results are  not back during the visit, make an appointment with your caregiver to find out the results. Do not assume everything is normal if you have not heard from your caregiver or the medical facility. It is important for you to follow up on all of your test results. HOME CARE INSTRUCTIONS   It is not unusual to pass moderate amounts of gas and experience mild abdominal cramping following the procedure. This is due to air being used to inflate your colon during the exam. Walking or a warm pack on your belly (abdomen) may help.  You may resume all normal meals and activities after sedatives and medicines have worn off.  Only take over-the-counter or prescription medicines for pain, discomfort, or fever as directed by your caregiver. Do not use aspirin or blood thinners if a biopsy was taken. Consult your caregiver for medicine usage if biopsies were taken. SEEK IMMEDIATE MEDICAL CARE IF:   You have a fever.  You pass large blood clots or fill a toilet with blood following the procedure. This may also occur 10 to 14 days following the procedure. This is more likely if a biopsy was taken.  You develop abdominal pain that keeps getting worse and cannot be relieved with medicine. Document Released: 10/03/2000 Document Revised: 12/29/2011 Document Reviewed: 05/18/2008 [facility] Patient Information 2014 [place], [place].  Patient has been scheduled for a colonoscopy on 07-13-13 at [facility]. This patient has been asked to hold Januvia day of colonoscopy prep and procedure.

## 2013-07-04 NOTE — Progress Notes (Signed)
Patient ID: Rachael Castro, female   DOB: April 12, 1931, 79 y.o.   MRN: [id number]  Chief Complaint  Patient presents with  . Follow-up    mammogram    HPI Rachael Castro is a 79 y.o. female who presents for a breast evaluation. The most recent mammogram was done on 06/22/13.Patient does perform regular self breast checks and gets regular mammograms done. Patient had a right breast modified radical mastectomy for cancer in 1999. She reports no new breast symptoms. Over 1 yr ago she had a GIST removed from stomach and has completed 1 yr course of Gleevac.   HPIssamam  Past Medical History  Diagnosis Date  . Hypertension   . Diabetes mellitus   . GERD (gastroesophageal reflux disease)   . Neuromuscular disorder     hx shingles  . Hypercholesteremia   . Personal history of colonic polyps   . Thyroid disease   . b     breast cancer  . Malignant neoplasm of other specified sites of stomach 2013    GIST    Past Surgical History  Procedure Laterality Date  . Mastectomy    . Abdominal hysterectomy    . Tubal ligation    . Tonsillectomy    . Appendectomy    . Eus  02/12/2012    Procedure: UPPER ENDOSCOPIC ULTRASOUND (EUS) LINEAR;  Surgeon: Rachael Fee, MD;  Location: [facility];  Service: Endoscopy;  Laterality: N/A;  radial linear  . Hernia repair  2012  . Salpingoophorectomy Right 1979  . Thyroidectomy  1978  . Breast surgery Right 1999    mastectomy  . Laparotomy  2013    excision of gastric wall mass   . Eye surgery Right 2013    cataract  . Upper gi endoscopy  2013  . Colonoscopy  2008    Rachael Castro    History reviewed. No pertinent family history.  Social History History  Substance Use Topics  . Smoking status: Never Smoker   . Smokeless tobacco: Never Used  . Alcohol Use: No    No Known Allergies  Current Outpatient Prescriptions  Medication Sig Dispense Refill  . aspirin 81 MG tablet Take 81 mg by mouth daily.      . calcium citrate-vitamin D 200-200  MG-UNIT TABS Take 1 tablet by mouth daily.      . DULoxetine (CYMBALTA) 30 MG capsule Take 30 mg by mouth daily.      . Gabapentin, PHN, 300 MG TABS Take 300 mg by mouth as needed.      Marland Kitchen losartan (COZAAR) 50 MG tablet Take 50 mg by mouth every other day.      . Multiple Vitamin (MULTIVITAMIN) tablet Take 1 tablet by mouth daily.      . niacin (NIASPAN) 1000 MG CR tablet Take 1,000 mg by mouth at bedtime.      Marland Kitchen omeprazole (PRILOSEC) 40 MG capsule Take 40 mg by mouth daily.      . pravastatin (PRAVACHOL) 40 MG tablet Take 40 mg by mouth daily.      . sitaGLIPtin (JANUVIA) 100 MG tablet Take 100 mg by mouth daily.      Marland Kitchen thiamine (VITAMIN B-1) 100 MG tablet Take 100 mg by mouth daily.      . polyethylene glycol powder (GLYCOLAX/MIRALAX) powder 255 grams one bottle for colonoscopy prep  255 g  0   No current facility-administered medications for this visit.    Review of Systems Review of Systems  Constitutional: Negative.  Respiratory: Negative.   Cardiovascular: Negative.     Blood pressure 130/72, pulse 68, resp. rate 13, height 5\' 4"  (1.626 m), weight 131 lb (59.421 kg).  Physical Exam Physical Exam  Constitutional: She is oriented to person, place, and time. She appears well-developed and well-nourished.  Eyes: Conjunctivae are normal. No scleral icterus.  Neck: Neck supple. No thyromegaly present.  Cardiovascular: Normal rate, regular rhythm and normal heart sounds.   Pulses:      Dorsalis pedis pulses are 2+ on the right side, and 2+ on the left side.       Posterior tibial pulses are 2+ on the right side, and 2+ on the left side.  No edema in the legs  Pulmonary/Chest: Effort normal and breath sounds normal. Left breast exhibits no inverted nipple, no mass, no nipple discharge, no skin change and no tenderness.  Right mastectomy site is well healed with no evidence or recurrence.   Abdominal: Soft. Normal appearance and bowel sounds are normal. There is no  hepatosplenomegaly. There is no tenderness. No hernia.  Lymphadenopathy:    She has no cervical adenopathy.    She has no axillary adenopathy.  Neurological: She is alert and oriented to person, place, and time. She has normal strength. No sensory deficit.  Skin: Skin is warm and intact.    Data Reviewed Left mammogram-normal.  Assessment    Stable exam, history of breast CA, GIST and colon polyp.     Plan    Pt due for surveillance colonoscopy, and will do Upper endo also due to history of GIST     Patient has been scheduled for a colonoscopy on 07-13-13 at [facility]. This patient has been asked to hold Januvia day of colonoscopy prep and procedure.   Rachael Castro G 07/04/2013, 12:18 PM

## 2013-07-06 ENCOUNTER — Other Ambulatory Visit: Payer: Self-pay | Admitting: General Surgery

## 2013-07-06 DIAGNOSIS — Z8509 Personal history of malignant neoplasm of other digestive organs: Secondary | ICD-10-CM

## 2013-07-06 DIAGNOSIS — Z86010 Personal history of colonic polyps: Secondary | ICD-10-CM

## 2013-07-12 ENCOUNTER — Telehealth: Payer: Self-pay | Admitting: *Deleted

## 2013-07-12 NOTE — Telephone Encounter (Signed)
Patient aware to be at the hospital at 7:45 am tomorrow. Trish in endoscopy notified.

## 2013-07-12 NOTE — Telephone Encounter (Signed)
Message for patient to call the office.   Trish from [facility] called to report that patient has not called for her arrival time for upper and lower endoscopy that is scheduled for tomorrow. Rosann Auerbach was also unable to reach patient by phone. She needs to be at the hospital at 7:45 am on 07-13-13.

## 2013-07-13 ENCOUNTER — Ambulatory Visit: Payer: Self-pay | Admitting: General Surgery

## 2013-07-13 DIAGNOSIS — E079 Disorder of thyroid, unspecified: Secondary | ICD-10-CM | POA: Diagnosis not present

## 2013-07-13 DIAGNOSIS — K219 Gastro-esophageal reflux disease without esophagitis: Secondary | ICD-10-CM | POA: Diagnosis not present

## 2013-07-13 DIAGNOSIS — Z85028 Personal history of other malignant neoplasm of stomach: Secondary | ICD-10-CM

## 2013-07-13 DIAGNOSIS — Z860100 Personal history of colon polyps, unspecified: Secondary | ICD-10-CM | POA: Diagnosis not present

## 2013-07-13 DIAGNOSIS — Z7982 Long term (current) use of aspirin: Secondary | ICD-10-CM | POA: Diagnosis not present

## 2013-07-13 DIAGNOSIS — E119 Type 2 diabetes mellitus without complications: Secondary | ICD-10-CM | POA: Diagnosis not present

## 2013-07-13 DIAGNOSIS — I10 Essential (primary) hypertension: Secondary | ICD-10-CM | POA: Diagnosis not present

## 2013-07-13 DIAGNOSIS — Z79899 Other long term (current) drug therapy: Secondary | ICD-10-CM | POA: Diagnosis not present

## 2013-07-13 DIAGNOSIS — E7800 Pure hypercholesterolemia, unspecified: Secondary | ICD-10-CM | POA: Diagnosis not present

## 2013-07-13 DIAGNOSIS — Z853 Personal history of malignant neoplasm of breast: Secondary | ICD-10-CM | POA: Diagnosis not present

## 2013-07-13 DIAGNOSIS — Q438 Other specified congenital malformations of intestine: Secondary | ICD-10-CM | POA: Diagnosis not present

## 2013-07-13 DIAGNOSIS — Z09 Encounter for follow-up examination after completed treatment for conditions other than malignant neoplasm: Secondary | ICD-10-CM | POA: Diagnosis not present

## 2013-07-14 ENCOUNTER — Encounter: Payer: Self-pay | Admitting: General Surgery

## 2013-07-19 ENCOUNTER — Ambulatory Visit: Payer: Self-pay | Admitting: Pain Medicine

## 2013-07-19 DIAGNOSIS — B0229 Other postherpetic nervous system involvement: Secondary | ICD-10-CM | POA: Diagnosis not present

## 2013-07-19 DIAGNOSIS — IMO0002 Reserved for concepts with insufficient information to code with codable children: Secondary | ICD-10-CM | POA: Diagnosis not present

## 2013-07-19 DIAGNOSIS — M79609 Pain in unspecified limb: Secondary | ICD-10-CM | POA: Diagnosis not present

## 2013-07-19 DIAGNOSIS — M47817 Spondylosis without myelopathy or radiculopathy, lumbosacral region: Secondary | ICD-10-CM | POA: Diagnosis not present

## 2013-07-19 DIAGNOSIS — M5137 Other intervertebral disc degeneration, lumbosacral region: Secondary | ICD-10-CM | POA: Diagnosis not present

## 2013-07-19 DIAGNOSIS — M542 Cervicalgia: Secondary | ICD-10-CM | POA: Diagnosis not present

## 2013-07-19 DIAGNOSIS — R51 Headache: Secondary | ICD-10-CM | POA: Diagnosis not present

## 2013-07-19 DIAGNOSIS — Z79899 Other long term (current) drug therapy: Secondary | ICD-10-CM | POA: Diagnosis not present

## 2013-07-27 ENCOUNTER — Ambulatory Visit: Payer: Self-pay | Admitting: Pain Medicine

## 2013-07-27 DIAGNOSIS — M542 Cervicalgia: Secondary | ICD-10-CM | POA: Diagnosis not present

## 2013-07-27 DIAGNOSIS — M531 Cervicobrachial syndrome: Secondary | ICD-10-CM | POA: Diagnosis not present

## 2013-07-27 DIAGNOSIS — Z79899 Other long term (current) drug therapy: Secondary | ICD-10-CM | POA: Diagnosis not present

## 2013-07-27 DIAGNOSIS — R51 Headache: Secondary | ICD-10-CM | POA: Diagnosis not present

## 2013-08-12 ENCOUNTER — Ambulatory Visit: Payer: Self-pay | Admitting: Oncology

## 2013-08-12 DIAGNOSIS — D649 Anemia, unspecified: Secondary | ICD-10-CM | POA: Diagnosis not present

## 2013-08-12 DIAGNOSIS — I10 Essential (primary) hypertension: Secondary | ICD-10-CM | POA: Diagnosis not present

## 2013-08-12 DIAGNOSIS — Z23 Encounter for immunization: Secondary | ICD-10-CM | POA: Diagnosis not present

## 2013-08-12 DIAGNOSIS — E119 Type 2 diabetes mellitus without complications: Secondary | ICD-10-CM | POA: Diagnosis not present

## 2013-08-12 DIAGNOSIS — Z853 Personal history of malignant neoplasm of breast: Secondary | ICD-10-CM | POA: Diagnosis not present

## 2013-08-12 DIAGNOSIS — Z9010 Acquired absence of unspecified breast and nipple: Secondary | ICD-10-CM | POA: Diagnosis not present

## 2013-08-12 DIAGNOSIS — Z9223 Personal history of estrogen therapy: Secondary | ICD-10-CM | POA: Diagnosis not present

## 2013-08-12 DIAGNOSIS — Z79899 Other long term (current) drug therapy: Secondary | ICD-10-CM | POA: Diagnosis not present

## 2013-08-12 DIAGNOSIS — Z9221 Personal history of antineoplastic chemotherapy: Secondary | ICD-10-CM | POA: Diagnosis not present

## 2013-08-12 DIAGNOSIS — Z7982 Long term (current) use of aspirin: Secondary | ICD-10-CM | POA: Diagnosis not present

## 2013-08-12 DIAGNOSIS — D214 Benign neoplasm of connective and other soft tissue of abdomen: Secondary | ICD-10-CM | POA: Diagnosis not present

## 2013-08-12 LAB — COMPREHENSIVE METABOLIC PANEL
Albumin: 3.7 g/dL (ref 3.4–5.0)
BUN: 18 mg/dL (ref 7–18)
Bilirubin,Total: 0.5 mg/dL (ref 0.2–1.0)
Calcium, Total: 8.8 mg/dL (ref 8.5–10.1)
Osmolality: 286 (ref 275–301)
Potassium: 3.6 mmol/L (ref 3.5–5.1)
SGOT(AST): 30 U/L (ref 15–37)
SGPT (ALT): 33 U/L (ref 12–78)
Total Protein: 6.9 g/dL (ref 6.4–8.2)

## 2013-08-12 LAB — CBC CANCER CENTER
Basophil #: 0 x10 3/mm (ref 0.0–0.1)
Basophil %: 0.3 %
Eosinophil #: 0.1 x10 3/mm (ref 0.0–0.7)
Eosinophil %: 1 %
HCT: 36.6 % (ref 35.0–47.0)
HGB: 12.2 g/dL (ref 12.0–16.0)
Lymphocyte %: 26.8 %
MCH: 33 pg (ref 26.0–34.0)
MCV: 99 fL (ref 80–100)
Monocyte #: 0.4 x10 3/mm (ref 0.2–0.9)
Monocyte %: 7.3 %
Neutrophil #: 3.8 x10 3/mm (ref 1.4–6.5)
RBC: 3.68 10*6/uL — ABNORMAL LOW (ref 3.80–5.20)
RDW: 12.8 % (ref 11.5–14.5)
WBC: 5.8 x10 3/mm (ref 3.6–11.0)

## 2013-08-20 ENCOUNTER — Ambulatory Visit: Payer: Self-pay | Admitting: Oncology

## 2013-08-25 ENCOUNTER — Ambulatory Visit: Payer: Self-pay | Admitting: Pain Medicine

## 2013-08-25 DIAGNOSIS — R51 Headache: Secondary | ICD-10-CM | POA: Diagnosis not present

## 2013-08-25 DIAGNOSIS — M542 Cervicalgia: Secondary | ICD-10-CM | POA: Diagnosis not present

## 2013-08-25 DIAGNOSIS — M5030 Other cervical disc degeneration, unspecified cervical region: Secondary | ICD-10-CM | POA: Diagnosis not present

## 2013-08-25 DIAGNOSIS — Z79899 Other long term (current) drug therapy: Secondary | ICD-10-CM | POA: Diagnosis not present

## 2013-08-25 DIAGNOSIS — IMO0002 Reserved for concepts with insufficient information to code with codable children: Secondary | ICD-10-CM | POA: Diagnosis not present

## 2013-08-25 DIAGNOSIS — M47817 Spondylosis without myelopathy or radiculopathy, lumbosacral region: Secondary | ICD-10-CM | POA: Diagnosis not present

## 2013-08-25 DIAGNOSIS — M79609 Pain in unspecified limb: Secondary | ICD-10-CM | POA: Diagnosis not present

## 2013-08-25 DIAGNOSIS — IMO0001 Reserved for inherently not codable concepts without codable children: Secondary | ICD-10-CM | POA: Diagnosis not present

## 2013-09-05 ENCOUNTER — Ambulatory Visit: Payer: Self-pay | Admitting: Pain Medicine

## 2013-09-05 DIAGNOSIS — Z79899 Other long term (current) drug therapy: Secondary | ICD-10-CM | POA: Diagnosis not present

## 2013-09-05 DIAGNOSIS — R51 Headache: Secondary | ICD-10-CM | POA: Diagnosis not present

## 2013-09-05 DIAGNOSIS — M531 Cervicobrachial syndrome: Secondary | ICD-10-CM | POA: Diagnosis not present

## 2013-09-05 DIAGNOSIS — M62838 Other muscle spasm: Secondary | ICD-10-CM | POA: Diagnosis not present

## 2013-09-05 DIAGNOSIS — IMO0001 Reserved for inherently not codable concepts without codable children: Secondary | ICD-10-CM | POA: Diagnosis not present

## 2013-09-12 DIAGNOSIS — M810 Age-related osteoporosis without current pathological fracture: Secondary | ICD-10-CM | POA: Diagnosis not present

## 2013-09-12 DIAGNOSIS — E785 Hyperlipidemia, unspecified: Secondary | ICD-10-CM | POA: Diagnosis not present

## 2013-09-12 DIAGNOSIS — I10 Essential (primary) hypertension: Secondary | ICD-10-CM | POA: Diagnosis not present

## 2013-09-12 DIAGNOSIS — E119 Type 2 diabetes mellitus without complications: Secondary | ICD-10-CM | POA: Diagnosis not present

## 2013-09-27 ENCOUNTER — Ambulatory Visit: Payer: Self-pay | Admitting: Pain Medicine

## 2013-09-27 DIAGNOSIS — M542 Cervicalgia: Secondary | ICD-10-CM | POA: Diagnosis not present

## 2013-09-27 DIAGNOSIS — M79609 Pain in unspecified limb: Secondary | ICD-10-CM | POA: Diagnosis not present

## 2013-09-27 DIAGNOSIS — B0229 Other postherpetic nervous system involvement: Secondary | ICD-10-CM | POA: Diagnosis not present

## 2013-09-27 DIAGNOSIS — IMO0001 Reserved for inherently not codable concepts without codable children: Secondary | ICD-10-CM | POA: Diagnosis not present

## 2013-09-27 DIAGNOSIS — M47817 Spondylosis without myelopathy or radiculopathy, lumbosacral region: Secondary | ICD-10-CM | POA: Diagnosis not present

## 2013-09-27 DIAGNOSIS — Z79899 Other long term (current) drug therapy: Secondary | ICD-10-CM | POA: Diagnosis not present

## 2013-09-27 DIAGNOSIS — R51 Headache: Secondary | ICD-10-CM | POA: Diagnosis not present

## 2013-09-27 DIAGNOSIS — IMO0002 Reserved for concepts with insufficient information to code with codable children: Secondary | ICD-10-CM | POA: Diagnosis not present

## 2013-10-05 ENCOUNTER — Ambulatory Visit: Payer: Self-pay | Admitting: Pain Medicine

## 2013-10-05 DIAGNOSIS — R51 Headache: Secondary | ICD-10-CM | POA: Diagnosis not present

## 2013-10-05 DIAGNOSIS — M531 Cervicobrachial syndrome: Secondary | ICD-10-CM | POA: Diagnosis not present

## 2013-10-05 DIAGNOSIS — M5020 Other cervical disc displacement, unspecified cervical region: Secondary | ICD-10-CM | POA: Diagnosis not present

## 2013-10-05 DIAGNOSIS — M62838 Other muscle spasm: Secondary | ICD-10-CM | POA: Diagnosis not present

## 2013-10-05 DIAGNOSIS — Z79899 Other long term (current) drug therapy: Secondary | ICD-10-CM | POA: Diagnosis not present

## 2013-10-05 DIAGNOSIS — M5030 Other cervical disc degeneration, unspecified cervical region: Secondary | ICD-10-CM | POA: Diagnosis not present

## 2013-11-08 ENCOUNTER — Ambulatory Visit: Payer: Self-pay | Admitting: Pain Medicine

## 2013-11-08 DIAGNOSIS — M62838 Other muscle spasm: Secondary | ICD-10-CM | POA: Diagnosis not present

## 2013-11-08 DIAGNOSIS — Z79899 Other long term (current) drug therapy: Secondary | ICD-10-CM | POA: Diagnosis not present

## 2013-11-08 DIAGNOSIS — M19019 Primary osteoarthritis, unspecified shoulder: Secondary | ICD-10-CM | POA: Diagnosis not present

## 2013-11-08 DIAGNOSIS — R51 Headache: Secondary | ICD-10-CM | POA: Diagnosis not present

## 2013-11-08 DIAGNOSIS — Z853 Personal history of malignant neoplasm of breast: Secondary | ICD-10-CM | POA: Diagnosis not present

## 2013-11-08 DIAGNOSIS — M67919 Unspecified disorder of synovium and tendon, unspecified shoulder: Secondary | ICD-10-CM | POA: Diagnosis not present

## 2013-11-08 DIAGNOSIS — M531 Cervicobrachial syndrome: Secondary | ICD-10-CM | POA: Diagnosis not present

## 2013-11-08 DIAGNOSIS — IMO0001 Reserved for inherently not codable concepts without codable children: Secondary | ICD-10-CM | POA: Diagnosis not present

## 2013-11-11 ENCOUNTER — Observation Stay: Payer: Self-pay | Admitting: Internal Medicine

## 2013-11-11 DIAGNOSIS — R55 Syncope and collapse: Secondary | ICD-10-CM | POA: Diagnosis not present

## 2013-11-11 DIAGNOSIS — R112 Nausea with vomiting, unspecified: Secondary | ICD-10-CM | POA: Diagnosis not present

## 2013-11-11 DIAGNOSIS — Z853 Personal history of malignant neoplasm of breast: Secondary | ICD-10-CM | POA: Diagnosis not present

## 2013-11-11 DIAGNOSIS — Z79899 Other long term (current) drug therapy: Secondary | ICD-10-CM | POA: Diagnosis not present

## 2013-11-11 DIAGNOSIS — Z9221 Personal history of antineoplastic chemotherapy: Secondary | ICD-10-CM | POA: Diagnosis not present

## 2013-11-11 DIAGNOSIS — Z8049 Family history of malignant neoplasm of other genital organs: Secondary | ICD-10-CM | POA: Diagnosis not present

## 2013-11-11 DIAGNOSIS — Z9010 Acquired absence of unspecified breast and nipple: Secondary | ICD-10-CM | POA: Diagnosis not present

## 2013-11-11 DIAGNOSIS — E785 Hyperlipidemia, unspecified: Secondary | ICD-10-CM | POA: Diagnosis not present

## 2013-11-11 DIAGNOSIS — I498 Other specified cardiac arrhythmias: Secondary | ICD-10-CM | POA: Diagnosis not present

## 2013-11-11 DIAGNOSIS — Z8249 Family history of ischemic heart disease and other diseases of the circulatory system: Secondary | ICD-10-CM | POA: Diagnosis not present

## 2013-11-11 DIAGNOSIS — E119 Type 2 diabetes mellitus without complications: Secondary | ICD-10-CM | POA: Diagnosis not present

## 2013-11-11 DIAGNOSIS — Z85028 Personal history of other malignant neoplasm of stomach: Secondary | ICD-10-CM | POA: Diagnosis not present

## 2013-11-11 DIAGNOSIS — I951 Orthostatic hypotension: Secondary | ICD-10-CM | POA: Diagnosis not present

## 2013-11-11 DIAGNOSIS — Z7982 Long term (current) use of aspirin: Secondary | ICD-10-CM | POA: Diagnosis not present

## 2013-11-11 DIAGNOSIS — E860 Dehydration: Secondary | ICD-10-CM | POA: Diagnosis not present

## 2013-11-11 DIAGNOSIS — I10 Essential (primary) hypertension: Secondary | ICD-10-CM | POA: Diagnosis not present

## 2013-11-11 DIAGNOSIS — E041 Nontoxic single thyroid nodule: Secondary | ICD-10-CM | POA: Diagnosis not present

## 2013-11-11 DIAGNOSIS — G8929 Other chronic pain: Secondary | ICD-10-CM | POA: Diagnosis not present

## 2013-11-11 DIAGNOSIS — Z8711 Personal history of peptic ulcer disease: Secondary | ICD-10-CM | POA: Diagnosis not present

## 2013-11-11 LAB — URINALYSIS, COMPLETE
BILIRUBIN, UR: NEGATIVE
Bacteria: NONE SEEN
Glucose,UR: NEGATIVE mg/dL (ref 0–75)
Hyaline Cast: 1
LEUKOCYTE ESTERASE: NEGATIVE
Nitrite: NEGATIVE
Ph: 5 (ref 4.5–8.0)
Protein: NEGATIVE
RBC,UR: 2 /HPF (ref 0–5)
Specific Gravity: 1.023 (ref 1.003–1.030)

## 2013-11-11 LAB — CBC
HCT: 39.9 % (ref 35.0–47.0)
HGB: 13.5 g/dL (ref 12.0–16.0)
MCH: 33.9 pg (ref 26.0–34.0)
MCHC: 33.7 g/dL (ref 32.0–36.0)
MCV: 101 fL — ABNORMAL HIGH (ref 80–100)
Platelet: 170 10*3/uL (ref 150–440)
RBC: 3.97 10*6/uL (ref 3.80–5.20)
RDW: 13.9 % (ref 11.5–14.5)
WBC: 10.7 10*3/uL (ref 3.6–11.0)

## 2013-11-11 LAB — BASIC METABOLIC PANEL
ANION GAP: 8 (ref 7–16)
BUN: 18 mg/dL (ref 7–18)
CALCIUM: 9.2 mg/dL (ref 8.5–10.1)
Chloride: 105 mmol/L (ref 98–107)
Co2: 26 mmol/L (ref 21–32)
Creatinine: 0.73 mg/dL (ref 0.60–1.30)
EGFR (Non-African Amer.): >60
GLUCOSE: 115 mg/dL — AB (ref 65–99)
Osmolality: 280 (ref 275–301)
POTASSIUM: 3.7 mmol/L (ref 3.5–5.1)
SODIUM: 139 mmol/L (ref 136–145)

## 2013-11-11 LAB — TROPONIN I: Troponin-I: <0.02 ng/mL

## 2013-11-12 DIAGNOSIS — I10 Essential (primary) hypertension: Secondary | ICD-10-CM | POA: Diagnosis not present

## 2013-11-12 DIAGNOSIS — E119 Type 2 diabetes mellitus without complications: Secondary | ICD-10-CM | POA: Diagnosis not present

## 2013-11-12 DIAGNOSIS — R55 Syncope and collapse: Secondary | ICD-10-CM | POA: Diagnosis not present

## 2013-11-12 LAB — TROPONIN I
Troponin-I: <0.02 ng/mL
Troponin-I: <0.02 ng/mL

## 2013-11-23 ENCOUNTER — Ambulatory Visit: Payer: Self-pay | Admitting: Pain Medicine

## 2013-11-23 DIAGNOSIS — M47817 Spondylosis without myelopathy or radiculopathy, lumbosacral region: Secondary | ICD-10-CM | POA: Diagnosis not present

## 2013-11-23 DIAGNOSIS — Z853 Personal history of malignant neoplasm of breast: Secondary | ICD-10-CM | POA: Diagnosis not present

## 2013-11-23 DIAGNOSIS — M67919 Unspecified disorder of synovium and tendon, unspecified shoulder: Secondary | ICD-10-CM | POA: Diagnosis not present

## 2013-11-23 DIAGNOSIS — R51 Headache: Secondary | ICD-10-CM | POA: Diagnosis not present

## 2013-11-23 DIAGNOSIS — M542 Cervicalgia: Secondary | ICD-10-CM | POA: Diagnosis not present

## 2013-11-23 DIAGNOSIS — M25519 Pain in unspecified shoulder: Secondary | ICD-10-CM | POA: Diagnosis not present

## 2013-11-24 DIAGNOSIS — E119 Type 2 diabetes mellitus without complications: Secondary | ICD-10-CM | POA: Diagnosis not present

## 2013-11-24 DIAGNOSIS — Z23 Encounter for immunization: Secondary | ICD-10-CM | POA: Diagnosis not present

## 2013-11-24 DIAGNOSIS — I10 Essential (primary) hypertension: Secondary | ICD-10-CM | POA: Diagnosis not present

## 2013-11-24 DIAGNOSIS — R55 Syncope and collapse: Secondary | ICD-10-CM | POA: Diagnosis not present

## 2013-12-12 ENCOUNTER — Emergency Department: Payer: Self-pay | Admitting: Emergency Medicine

## 2013-12-12 DIAGNOSIS — M47812 Spondylosis without myelopathy or radiculopathy, cervical region: Secondary | ICD-10-CM | POA: Diagnosis not present

## 2013-12-12 DIAGNOSIS — M4802 Spinal stenosis, cervical region: Secondary | ICD-10-CM | POA: Diagnosis not present

## 2013-12-12 DIAGNOSIS — Z853 Personal history of malignant neoplasm of breast: Secondary | ICD-10-CM | POA: Diagnosis not present

## 2013-12-12 DIAGNOSIS — Z8249 Family history of ischemic heart disease and other diseases of the circulatory system: Secondary | ICD-10-CM | POA: Diagnosis not present

## 2013-12-12 DIAGNOSIS — M5030 Other cervical disc degeneration, unspecified cervical region: Secondary | ICD-10-CM | POA: Diagnosis not present

## 2013-12-12 DIAGNOSIS — I10 Essential (primary) hypertension: Secondary | ICD-10-CM | POA: Diagnosis not present

## 2013-12-12 DIAGNOSIS — Z9889 Other specified postprocedural states: Secondary | ICD-10-CM | POA: Diagnosis not present

## 2013-12-12 DIAGNOSIS — E119 Type 2 diabetes mellitus without complications: Secondary | ICD-10-CM | POA: Diagnosis not present

## 2013-12-12 DIAGNOSIS — Z79899 Other long term (current) drug therapy: Secondary | ICD-10-CM | POA: Diagnosis not present

## 2013-12-19 ENCOUNTER — Ambulatory Visit: Payer: Self-pay | Admitting: Pain Medicine

## 2013-12-19 DIAGNOSIS — Z79899 Other long term (current) drug therapy: Secondary | ICD-10-CM | POA: Diagnosis not present

## 2013-12-19 DIAGNOSIS — M5030 Other cervical disc degeneration, unspecified cervical region: Secondary | ICD-10-CM | POA: Diagnosis not present

## 2013-12-19 DIAGNOSIS — IMO0002 Reserved for concepts with insufficient information to code with codable children: Secondary | ICD-10-CM | POA: Diagnosis not present

## 2013-12-19 DIAGNOSIS — R51 Headache: Secondary | ICD-10-CM | POA: Diagnosis not present

## 2013-12-22 DIAGNOSIS — I10 Essential (primary) hypertension: Secondary | ICD-10-CM | POA: Diagnosis not present

## 2013-12-22 DIAGNOSIS — R55 Syncope and collapse: Secondary | ICD-10-CM | POA: Diagnosis not present

## 2014-01-12 ENCOUNTER — Ambulatory Visit: Payer: Self-pay | Admitting: Family Medicine

## 2014-01-12 DIAGNOSIS — E119 Type 2 diabetes mellitus without complications: Secondary | ICD-10-CM | POA: Diagnosis not present

## 2014-01-12 DIAGNOSIS — R0602 Shortness of breath: Secondary | ICD-10-CM | POA: Diagnosis not present

## 2014-01-12 DIAGNOSIS — E785 Hyperlipidemia, unspecified: Secondary | ICD-10-CM | POA: Diagnosis not present

## 2014-01-12 DIAGNOSIS — I10 Essential (primary) hypertension: Secondary | ICD-10-CM | POA: Diagnosis not present

## 2014-01-17 ENCOUNTER — Ambulatory Visit: Payer: Self-pay | Admitting: Pain Medicine

## 2014-01-17 DIAGNOSIS — B0223 Postherpetic polyneuropathy: Secondary | ICD-10-CM | POA: Diagnosis not present

## 2014-01-17 DIAGNOSIS — B0229 Other postherpetic nervous system involvement: Secondary | ICD-10-CM | POA: Diagnosis not present

## 2014-01-17 DIAGNOSIS — M79609 Pain in unspecified limb: Secondary | ICD-10-CM | POA: Diagnosis not present

## 2014-01-17 DIAGNOSIS — M47817 Spondylosis without myelopathy or radiculopathy, lumbosacral region: Secondary | ICD-10-CM | POA: Diagnosis not present

## 2014-01-17 DIAGNOSIS — M5030 Other cervical disc degeneration, unspecified cervical region: Secondary | ICD-10-CM | POA: Diagnosis not present

## 2014-01-17 DIAGNOSIS — IMO0002 Reserved for concepts with insufficient information to code with codable children: Secondary | ICD-10-CM | POA: Diagnosis not present

## 2014-01-17 DIAGNOSIS — R51 Headache: Secondary | ICD-10-CM | POA: Diagnosis not present

## 2014-01-17 DIAGNOSIS — Z79899 Other long term (current) drug therapy: Secondary | ICD-10-CM | POA: Diagnosis not present

## 2014-01-17 DIAGNOSIS — M47812 Spondylosis without myelopathy or radiculopathy, cervical region: Secondary | ICD-10-CM | POA: Diagnosis not present

## 2014-02-07 ENCOUNTER — Ambulatory Visit: Payer: Self-pay | Admitting: Oncology

## 2014-02-08 ENCOUNTER — Ambulatory Visit: Payer: Self-pay | Admitting: Oncology

## 2014-02-08 DIAGNOSIS — C169 Malignant neoplasm of stomach, unspecified: Secondary | ICD-10-CM | POA: Diagnosis not present

## 2014-02-08 DIAGNOSIS — C50919 Malignant neoplasm of unspecified site of unspecified female breast: Secondary | ICD-10-CM | POA: Diagnosis not present

## 2014-02-09 ENCOUNTER — Ambulatory Visit: Payer: Self-pay | Admitting: Oncology

## 2014-02-10 DIAGNOSIS — Z853 Personal history of malignant neoplasm of breast: Secondary | ICD-10-CM | POA: Diagnosis not present

## 2014-02-10 DIAGNOSIS — Z9221 Personal history of antineoplastic chemotherapy: Secondary | ICD-10-CM | POA: Diagnosis not present

## 2014-02-10 DIAGNOSIS — C169 Malignant neoplasm of stomach, unspecified: Secondary | ICD-10-CM | POA: Diagnosis not present

## 2014-02-14 DIAGNOSIS — R079 Chest pain, unspecified: Secondary | ICD-10-CM | POA: Diagnosis not present

## 2014-02-14 DIAGNOSIS — E119 Type 2 diabetes mellitus without complications: Secondary | ICD-10-CM | POA: Diagnosis not present

## 2014-02-16 ENCOUNTER — Ambulatory Visit: Payer: Self-pay | Admitting: Pain Medicine

## 2014-02-16 DIAGNOSIS — M47812 Spondylosis without myelopathy or radiculopathy, cervical region: Secondary | ICD-10-CM | POA: Diagnosis not present

## 2014-02-16 DIAGNOSIS — IMO0002 Reserved for concepts with insufficient information to code with codable children: Secondary | ICD-10-CM | POA: Diagnosis not present

## 2014-02-16 DIAGNOSIS — Z79899 Other long term (current) drug therapy: Secondary | ICD-10-CM | POA: Diagnosis not present

## 2014-02-16 DIAGNOSIS — B0223 Postherpetic polyneuropathy: Secondary | ICD-10-CM | POA: Diagnosis not present

## 2014-02-16 DIAGNOSIS — M5030 Other cervical disc degeneration, unspecified cervical region: Secondary | ICD-10-CM | POA: Diagnosis not present

## 2014-02-16 DIAGNOSIS — M5137 Other intervertebral disc degeneration, lumbosacral region: Secondary | ICD-10-CM | POA: Diagnosis not present

## 2014-02-16 DIAGNOSIS — M47817 Spondylosis without myelopathy or radiculopathy, lumbosacral region: Secondary | ICD-10-CM | POA: Diagnosis not present

## 2014-02-16 DIAGNOSIS — M79609 Pain in unspecified limb: Secondary | ICD-10-CM | POA: Diagnosis not present

## 2014-02-17 ENCOUNTER — Ambulatory Visit: Payer: Self-pay | Admitting: Oncology

## 2014-02-20 ENCOUNTER — Encounter: Payer: Self-pay | Admitting: General Surgery

## 2014-02-20 ENCOUNTER — Ambulatory Visit (INDEPENDENT_AMBULATORY_CARE_PROVIDER_SITE_OTHER): Payer: Medicare Other | Admitting: General Surgery

## 2014-02-20 VITALS — BP 128/70 | HR 70 | Resp 12 | Ht 64.0 in | Wt 133.0 lb

## 2014-02-20 DIAGNOSIS — Z853 Personal history of malignant neoplasm of breast: Secondary | ICD-10-CM

## 2014-02-20 DIAGNOSIS — Z8509 Personal history of malignant neoplasm of other digestive organs: Secondary | ICD-10-CM | POA: Diagnosis not present

## 2014-02-20 DIAGNOSIS — Z86010 Personal history of colonic polyps: Secondary | ICD-10-CM

## 2014-02-20 NOTE — Progress Notes (Signed)
Patient ID: Rachael Castro, female   DOB: October 21, 1930, 80 y.o.   MRN: [id number]  Chief Complaint  Patient presents with  . Other    abnormal CT    HPI Rachael Castro is a 80 y.o. female here for abnormal findings related to left lymph node on CT done 02/08/14. Dr Oliva Bustard felt some enlargement in the left axillary region and ordered her to have a CT scan. She denies any pain or discomfort in the area. HPI  Past Medical History  Diagnosis Date  . Hypertension   . Diabetes mellitus   . GERD (gastroesophageal reflux disease)   . Neuromuscular disorder     hx shingles  . Hypercholesteremia   . Personal history of colonic polyps   . Thyroid disease   . b     breast cancer  . Malignant neoplasm of other specified sites of stomach 2013    GIST    Past Surgical History  Procedure Laterality Date  . Mastectomy    . Abdominal hysterectomy    . Tubal ligation    . Tonsillectomy    . Appendectomy    . Eus  02/12/2012    Procedure: UPPER ENDOSCOPIC ULTRASOUND (EUS) LINEAR;  Surgeon: Milus Banister, MD;  Location: [facility];  Service: Endoscopy;  Laterality: N/A;  radial linear  . Hernia repair  2012  . Salpingoophorectomy Right 1979  . Thyroidectomy  1978  . Breast surgery Right 1999    mastectomy  . Laparotomy  2013    excision of gastric wall mass   . Eye surgery Right 2013    cataract  . Upper gi endoscopy  2013  . Colonoscopy  2008    Rachael Castro    History reviewed. No pertinent family history.  Social History History  Substance Use Topics  . Smoking status: Never Smoker   . Smokeless tobacco: Never Used  . Alcohol Use: No    No Known Allergies  Current Outpatient Prescriptions  Medication Sig Dispense Refill  . aspirin 81 MG tablet Take 81 mg by mouth daily.      Marland Kitchen atorvastatin (LIPITOR) 40 MG tablet Take 40 mg by mouth daily.      . calcium citrate-vitamin D 200-200 MG-UNIT TABS Take 1 tablet by mouth daily.      . DULoxetine (CYMBALTA) 30 MG capsule Take  30 mg by mouth daily.      . Gabapentin, PHN, 300 MG TABS Take 300 mg by mouth as needed.      Marland Kitchen losartan (COZAAR) 50 MG tablet Take 50 mg by mouth every other day.      . Multiple Vitamin (MULTIVITAMIN) tablet Take 1 tablet by mouth daily.      Marland Kitchen omeprazole (PRILOSEC) 40 MG capsule Take 40 mg by mouth daily.      . sitaGLIPtin (JANUVIA) 100 MG tablet Take 100 mg by mouth daily.      Marland Kitchen thiamine (VITAMIN B-1) 100 MG tablet Take 100 mg by mouth daily.       No current facility-administered medications for this visit.    Review of Systems Review of Systems  Constitutional: Negative.   Respiratory: Negative.   Cardiovascular: Negative.     Blood pressure 128/70, pulse 70, resp. rate 12, height 5\' 4"  (1.626 m), weight 133 lb (60.328 kg).  Physical Exam Physical Exam  Constitutional: She is oriented to person, place, and time. She appears well-developed and well-nourished.  Eyes: Conjunctivae are normal. No scleral icterus.  Neck: Normal  range of motion. Neck supple.  Pulmonary/Chest: Left breast exhibits no inverted nipple, no mass, no nipple discharge, no skin change and no tenderness.  Well healed right mastectomy site.  Lymphadenopathy:    She has no cervical adenopathy.    She has no axillary adenopathy (there is a sub centimeter soft node in central left axilla.).  Neurological: She is alert and oriented to person, place, and time.    Data Reviewed Ct scan was reviewed  The left axillary nodes are very small and are not concerning for any malignancy  Assessment    Benign appearing nodes left axilla     Plan    Follow up as scheduled. Advised that she does not need any intervention for the left axillary nodes at present        [place] 02/20/2014, 11:28 AM

## 2014-02-20 NOTE — Patient Instructions (Signed)
Followup as scheduled in September

## 2014-03-21 ENCOUNTER — Ambulatory Visit: Payer: Self-pay | Admitting: Pain Medicine

## 2014-03-21 DIAGNOSIS — M62838 Other muscle spasm: Secondary | ICD-10-CM | POA: Diagnosis not present

## 2014-03-21 DIAGNOSIS — M79609 Pain in unspecified limb: Secondary | ICD-10-CM | POA: Diagnosis not present

## 2014-03-21 DIAGNOSIS — IMO0002 Reserved for concepts with insufficient information to code with codable children: Secondary | ICD-10-CM | POA: Diagnosis not present

## 2014-03-21 DIAGNOSIS — R51 Headache: Secondary | ICD-10-CM | POA: Diagnosis not present

## 2014-03-21 DIAGNOSIS — IMO0001 Reserved for inherently not codable concepts without codable children: Secondary | ICD-10-CM | POA: Diagnosis not present

## 2014-03-21 DIAGNOSIS — M47817 Spondylosis without myelopathy or radiculopathy, lumbosacral region: Secondary | ICD-10-CM | POA: Diagnosis not present

## 2014-03-21 DIAGNOSIS — Z79899 Other long term (current) drug therapy: Secondary | ICD-10-CM | POA: Diagnosis not present

## 2014-03-21 DIAGNOSIS — B0223 Postherpetic polyneuropathy: Secondary | ICD-10-CM | POA: Diagnosis not present

## 2014-03-21 DIAGNOSIS — M5030 Other cervical disc degeneration, unspecified cervical region: Secondary | ICD-10-CM | POA: Diagnosis not present

## 2014-05-18 ENCOUNTER — Ambulatory Visit: Payer: Self-pay | Admitting: Family Medicine

## 2014-05-18 DIAGNOSIS — N390 Urinary tract infection, site not specified: Secondary | ICD-10-CM | POA: Diagnosis not present

## 2014-05-18 DIAGNOSIS — R1032 Left lower quadrant pain: Secondary | ICD-10-CM | POA: Diagnosis not present

## 2014-05-18 DIAGNOSIS — R109 Unspecified abdominal pain: Secondary | ICD-10-CM | POA: Diagnosis not present

## 2014-05-18 DIAGNOSIS — R319 Hematuria, unspecified: Secondary | ICD-10-CM | POA: Diagnosis not present

## 2014-05-18 DIAGNOSIS — E785 Hyperlipidemia, unspecified: Secondary | ICD-10-CM | POA: Diagnosis not present

## 2014-05-18 DIAGNOSIS — E119 Type 2 diabetes mellitus without complications: Secondary | ICD-10-CM | POA: Diagnosis not present

## 2014-05-18 DIAGNOSIS — I10 Essential (primary) hypertension: Secondary | ICD-10-CM | POA: Diagnosis not present

## 2014-05-19 ENCOUNTER — Emergency Department: Payer: Self-pay | Admitting: Emergency Medicine

## 2014-05-19 DIAGNOSIS — Z90710 Acquired absence of both cervix and uterus: Secondary | ICD-10-CM | POA: Diagnosis not present

## 2014-05-19 DIAGNOSIS — R109 Unspecified abdominal pain: Secondary | ICD-10-CM | POA: Diagnosis not present

## 2014-05-19 DIAGNOSIS — K219 Gastro-esophageal reflux disease without esophagitis: Secondary | ICD-10-CM | POA: Diagnosis not present

## 2014-05-19 DIAGNOSIS — N200 Calculus of kidney: Secondary | ICD-10-CM | POA: Diagnosis not present

## 2014-05-19 DIAGNOSIS — I10 Essential (primary) hypertension: Secondary | ICD-10-CM | POA: Diagnosis not present

## 2014-05-19 DIAGNOSIS — N201 Calculus of ureter: Secondary | ICD-10-CM | POA: Diagnosis not present

## 2014-05-19 DIAGNOSIS — Z79899 Other long term (current) drug therapy: Secondary | ICD-10-CM | POA: Diagnosis not present

## 2014-05-19 DIAGNOSIS — E119 Type 2 diabetes mellitus without complications: Secondary | ICD-10-CM | POA: Diagnosis not present

## 2014-05-19 DIAGNOSIS — Z9089 Acquired absence of other organs: Secondary | ICD-10-CM | POA: Diagnosis not present

## 2014-05-19 LAB — BASIC METABOLIC PANEL
Anion Gap: 10 (ref 7–16)
BUN: 12 mg/dL (ref 7–18)
CALCIUM: 8.9 mg/dL (ref 8.5–10.1)
CREATININE: 0.99 mg/dL (ref 0.60–1.30)
Chloride: 100 mmol/L (ref 98–107)
Co2: 28 mmol/L (ref 21–32)
EGFR (Non-African Amer.): 54 — ABNORMAL LOW
Glucose: 151 mg/dL — ABNORMAL HIGH (ref 65–99)
Osmolality: 278 (ref 275–301)
Potassium: 3.3 mmol/L — ABNORMAL LOW (ref 3.5–5.1)
SODIUM: 138 mmol/L (ref 136–145)

## 2014-05-19 LAB — CBC
HCT: 36.7 % (ref 35.0–47.0)
HGB: 12.1 g/dL (ref 12.0–16.0)
MCH: 32.1 pg (ref 26.0–34.0)
MCHC: 33 g/dL (ref 32.0–36.0)
MCV: 97 fL (ref 80–100)
PLATELETS: 214 10*3/uL (ref 150–440)
RBC: 3.76 10*6/uL — ABNORMAL LOW (ref 3.80–5.20)
RDW: 12.8 % (ref 11.5–14.5)
WBC: 11.9 10*3/uL — ABNORMAL HIGH (ref 3.6–11.0)

## 2014-05-19 LAB — URINALYSIS, COMPLETE
Bacteria: NONE SEEN
Bilirubin,UR: NEGATIVE
GLUCOSE, UR: NEGATIVE mg/dL (ref 0–75)
Leukocyte Esterase: NEGATIVE
Nitrite: NEGATIVE
Ph: 6 (ref 4.5–8.0)
Protein: NEGATIVE
RBC,UR: 4 /HPF (ref 0–5)
SPECIFIC GRAVITY: 1.01 (ref 1.003–1.030)
Squamous Epithelial: NONE SEEN
WBC UR: <1 /HPF (ref 0–5)

## 2014-05-25 DIAGNOSIS — N200 Calculus of kidney: Secondary | ICD-10-CM | POA: Diagnosis not present

## 2014-05-25 DIAGNOSIS — J40 Bronchitis, not specified as acute or chronic: Secondary | ICD-10-CM | POA: Diagnosis not present

## 2014-06-06 ENCOUNTER — Ambulatory Visit: Payer: Self-pay

## 2014-06-06 DIAGNOSIS — R319 Hematuria, unspecified: Secondary | ICD-10-CM | POA: Diagnosis not present

## 2014-06-06 DIAGNOSIS — R3129 Other microscopic hematuria: Secondary | ICD-10-CM | POA: Diagnosis not present

## 2014-06-06 DIAGNOSIS — N201 Calculus of ureter: Secondary | ICD-10-CM | POA: Diagnosis not present

## 2014-06-20 ENCOUNTER — Ambulatory Visit: Payer: Self-pay | Admitting: Pain Medicine

## 2014-06-20 DIAGNOSIS — M79609 Pain in unspecified limb: Secondary | ICD-10-CM | POA: Diagnosis not present

## 2014-06-20 DIAGNOSIS — M4802 Spinal stenosis, cervical region: Secondary | ICD-10-CM | POA: Diagnosis not present

## 2014-06-20 DIAGNOSIS — IMO0002 Reserved for concepts with insufficient information to code with codable children: Secondary | ICD-10-CM | POA: Diagnosis not present

## 2014-06-20 DIAGNOSIS — M47812 Spondylosis without myelopathy or radiculopathy, cervical region: Secondary | ICD-10-CM | POA: Diagnosis not present

## 2014-06-20 DIAGNOSIS — M47817 Spondylosis without myelopathy or radiculopathy, lumbosacral region: Secondary | ICD-10-CM | POA: Diagnosis not present

## 2014-06-20 DIAGNOSIS — B0223 Postherpetic polyneuropathy: Secondary | ICD-10-CM | POA: Diagnosis not present

## 2014-06-20 DIAGNOSIS — N200 Calculus of kidney: Secondary | ICD-10-CM | POA: Diagnosis not present

## 2014-06-27 ENCOUNTER — Ambulatory Visit: Payer: Self-pay | Admitting: Obstetrics and Gynecology

## 2014-06-27 DIAGNOSIS — N201 Calculus of ureter: Secondary | ICD-10-CM | POA: Diagnosis not present

## 2014-07-04 DIAGNOSIS — N200 Calculus of kidney: Secondary | ICD-10-CM | POA: Diagnosis not present

## 2014-07-04 DIAGNOSIS — N201 Calculus of ureter: Secondary | ICD-10-CM | POA: Diagnosis not present

## 2014-07-05 ENCOUNTER — Ambulatory Visit: Payer: Self-pay | Admitting: Urology

## 2014-07-05 DIAGNOSIS — Z01810 Encounter for preprocedural cardiovascular examination: Secondary | ICD-10-CM

## 2014-07-05 DIAGNOSIS — N201 Calculus of ureter: Secondary | ICD-10-CM | POA: Diagnosis not present

## 2014-07-05 DIAGNOSIS — E119 Type 2 diabetes mellitus without complications: Secondary | ICD-10-CM

## 2014-07-05 DIAGNOSIS — I10 Essential (primary) hypertension: Secondary | ICD-10-CM

## 2014-07-05 LAB — BASIC METABOLIC PANEL
Anion Gap: 10 (ref 7–16)
BUN: 16 mg/dL (ref 7–18)
CO2: 25 mmol/L (ref 21–32)
CREATININE: 0.81 mg/dL (ref 0.60–1.30)
Calcium, Total: 9.2 mg/dL (ref 8.5–10.1)
Chloride: 107 mmol/L (ref 98–107)
GLUCOSE: 146 mg/dL — AB (ref 65–99)
Osmolality: 287 (ref 275–301)
Potassium: 3.8 mmol/L (ref 3.5–5.1)
SODIUM: 142 mmol/L (ref 136–145)

## 2014-07-06 ENCOUNTER — Ambulatory Visit: Payer: Self-pay | Admitting: Urology

## 2014-07-06 DIAGNOSIS — Z7982 Long term (current) use of aspirin: Secondary | ICD-10-CM | POA: Diagnosis not present

## 2014-07-06 DIAGNOSIS — E119 Type 2 diabetes mellitus without complications: Secondary | ICD-10-CM | POA: Diagnosis not present

## 2014-07-06 DIAGNOSIS — I10 Essential (primary) hypertension: Secondary | ICD-10-CM | POA: Diagnosis not present

## 2014-07-06 DIAGNOSIS — E7800 Pure hypercholesterolemia, unspecified: Secondary | ICD-10-CM | POA: Diagnosis not present

## 2014-07-06 DIAGNOSIS — Z8042 Family history of malignant neoplasm of prostate: Secondary | ICD-10-CM | POA: Diagnosis not present

## 2014-07-06 DIAGNOSIS — N201 Calculus of ureter: Secondary | ICD-10-CM | POA: Diagnosis not present

## 2014-07-06 DIAGNOSIS — Z853 Personal history of malignant neoplasm of breast: Secondary | ICD-10-CM | POA: Diagnosis not present

## 2014-07-06 DIAGNOSIS — Z79899 Other long term (current) drug therapy: Secondary | ICD-10-CM | POA: Diagnosis not present

## 2014-07-13 ENCOUNTER — Ambulatory Visit: Payer: Self-pay | Admitting: Urology

## 2014-07-13 DIAGNOSIS — N200 Calculus of kidney: Secondary | ICD-10-CM | POA: Diagnosis not present

## 2014-07-13 DIAGNOSIS — N201 Calculus of ureter: Secondary | ICD-10-CM | POA: Diagnosis not present

## 2014-07-13 DIAGNOSIS — R319 Hematuria, unspecified: Secondary | ICD-10-CM | POA: Diagnosis not present

## 2014-07-17 ENCOUNTER — Ambulatory Visit: Payer: Self-pay | Admitting: General Surgery

## 2014-07-17 DIAGNOSIS — Z853 Personal history of malignant neoplasm of breast: Secondary | ICD-10-CM | POA: Diagnosis not present

## 2014-07-17 DIAGNOSIS — Z1231 Encounter for screening mammogram for malignant neoplasm of breast: Secondary | ICD-10-CM | POA: Diagnosis not present

## 2014-07-18 ENCOUNTER — Encounter: Payer: Self-pay | Admitting: General Surgery

## 2014-07-24 ENCOUNTER — Encounter: Payer: Self-pay | Admitting: General Surgery

## 2014-07-24 ENCOUNTER — Ambulatory Visit (INDEPENDENT_AMBULATORY_CARE_PROVIDER_SITE_OTHER): Payer: Medicare Other | Admitting: General Surgery

## 2014-07-24 VITALS — BP 130/72 | HR 68 | Resp 14 | Ht 64.0 in | Wt 135.0 lb

## 2014-07-24 DIAGNOSIS — Z86010 Personal history of colonic polyps: Secondary | ICD-10-CM

## 2014-07-24 DIAGNOSIS — Z8509 Personal history of malignant neoplasm of other digestive organs: Secondary | ICD-10-CM

## 2014-07-24 DIAGNOSIS — Z853 Personal history of malignant neoplasm of breast: Secondary | ICD-10-CM | POA: Diagnosis not present

## 2014-07-24 NOTE — Patient Instructions (Addendum)
Pt to return in 1 year. Left screening Mammogram.  Continue self breast exams. Call for any breast or GI problems.

## 2014-07-24 NOTE — Progress Notes (Signed)
Patient ID: Rachael Castro, female   DOB: 12/20/1930, 80 y.o.   MRN: [id number]  Chief Complaint  Patient presents with  . Follow-up    mammmogram    HPI Rachael Castro is a 80 y.o. female who presents for breast cancer f/u. The most recent mammogram was done on 07/17/14. Pt also in follow up for resection of GIST. Recently pt has undergone treatment for a kidney stone. She is being followed up by urologist for this issue. Patient does perform regular self breast checks and gets regular mammograms done.  Pt denies any GI symptoms. No breast comnplaints  HPI  Past Medical History  Diagnosis Date  . Hypertension   . Diabetes mellitus   . GERD (gastroesophageal reflux disease)   . Neuromuscular disorder     hx shingles  . Hypercholesteremia   . Personal history of colonic polyps   . Thyroid disease   . b     breast cancer  . Malignant neoplasm of other specified sites of stomach 2013    GIST    Past Surgical History  Procedure Laterality Date  . Mastectomy    . Abdominal hysterectomy    . Tubal ligation    . Tonsillectomy    . Appendectomy    . Eus  02/12/2012    Procedure: UPPER ENDOSCOPIC ULTRASOUND (EUS) LINEAR;  Surgeon: Rachael Banister, MD;  Location: [facility];  Service: Endoscopy;  Laterality: N/A;  radial linear  . Hernia repair  2012  . Salpingoophorectomy Right 1979  . Thyroidectomy  1978  . Breast surgery Right 1999    mastectomy  . Laparotomy  2013    excision of gastric wall mass   . Eye surgery Right 2013    cataract  . Upper gi endoscopy  2013  . Colonoscopy  2008    Rachael Castro    History reviewed. No pertinent family history.  Social History History  Substance Use Topics  . Smoking status: Never Smoker   . Smokeless tobacco: Never Used  . Alcohol Use: No    No Known Allergies  Current Outpatient Prescriptions  Medication Sig Dispense Refill  . aspirin 81 MG tablet Take 81 mg by mouth daily.      Marland Kitchen atorvastatin (LIPITOR) 40 MG tablet  Take 40 mg by mouth daily.      . calcium citrate-vitamin D 200-200 MG-UNIT TABS Take 1 tablet by mouth daily.      . DULoxetine (CYMBALTA) 30 MG capsule Take 30 mg by mouth daily.      . Gabapentin, PHN, 300 MG TABS Take 300 mg by mouth as needed.      Marland Kitchen losartan (COZAAR) 50 MG tablet Take 50 mg by mouth every other day.      . Multiple Vitamin (MULTIVITAMIN) tablet Take 1 tablet by mouth daily.      Marland Kitchen omeprazole (PRILOSEC) 40 MG capsule Take 40 mg by mouth daily.      . sitaGLIPtin (JANUVIA) 100 MG tablet Take 100 mg by mouth daily.      Marland Kitchen thiamine (VITAMIN B-1) 100 MG tablet Take 100 mg by mouth daily.       No current facility-administered medications for this visit.    Review of Systems Review of Systems  Constitutional: Negative.   Respiratory: Negative.   Cardiovascular: Negative.     Blood pressure 130/72, pulse 68, resp. rate 14, height 5\' 4"  (1.626 m), weight 135 lb (61.236 kg).  Physical Exam Physical Exam  Constitutional: She  is oriented to person, place, and time. She appears well-developed and well-nourished.  Eyes: Conjunctivae are normal. No scleral icterus.  Neck: Neck supple.  Cardiovascular: Normal rate, regular rhythm and normal heart sounds.   Pulmonary/Chest: Effort normal and breath sounds normal. Left breast exhibits no inverted nipple, no mass, no nipple discharge, no skin change and no tenderness.  Rt mastectomy site remains well healed with no sign of recurrence.   Abdominal: Soft. Normal appearance and bowel sounds are normal. There is no tenderness (mild left mid abdomen).  Lymphadenopathy:    She has no cervical adenopathy.    She has no axillary adenopathy.  Neurological: She is alert and oriented to person, place, and time.  Skin: Skin is warm and dry.    Data Reviewed Mammogram reviewed and stable  Assessment    Stable exam. With history of right breast cancer in 1999 and GIST in 2013.  Also has history of colon polyps. Last colonoscopy 2  yrs ago.    Plan    Pt to return in 1 year. Left screening Mammogram.         Rachael Castro 07/24/2014, 10:19 AM

## 2014-07-28 ENCOUNTER — Ambulatory Visit: Payer: Self-pay | Admitting: Oncology

## 2014-07-28 DIAGNOSIS — Z23 Encounter for immunization: Secondary | ICD-10-CM | POA: Diagnosis not present

## 2014-07-28 DIAGNOSIS — Z79899 Other long term (current) drug therapy: Secondary | ICD-10-CM | POA: Diagnosis not present

## 2014-07-28 DIAGNOSIS — Z9010 Acquired absence of unspecified breast and nipple: Secondary | ICD-10-CM | POA: Diagnosis not present

## 2014-07-28 DIAGNOSIS — I10 Essential (primary) hypertension: Secondary | ICD-10-CM | POA: Diagnosis not present

## 2014-07-28 DIAGNOSIS — D481 Neoplasm of uncertain behavior of connective and other soft tissue: Secondary | ICD-10-CM | POA: Diagnosis not present

## 2014-07-28 DIAGNOSIS — Z87442 Personal history of urinary calculi: Secondary | ICD-10-CM | POA: Diagnosis not present

## 2014-07-28 DIAGNOSIS — Z90710 Acquired absence of both cervix and uterus: Secondary | ICD-10-CM | POA: Diagnosis not present

## 2014-07-28 DIAGNOSIS — Z7982 Long term (current) use of aspirin: Secondary | ICD-10-CM | POA: Diagnosis not present

## 2014-07-28 DIAGNOSIS — E119 Type 2 diabetes mellitus without complications: Secondary | ICD-10-CM | POA: Diagnosis not present

## 2014-07-28 DIAGNOSIS — Z853 Personal history of malignant neoplasm of breast: Secondary | ICD-10-CM | POA: Diagnosis not present

## 2014-07-28 LAB — COMPREHENSIVE METABOLIC PANEL
ANION GAP: 7 (ref 7–16)
Albumin: 3.7 g/dL (ref 3.4–5.0)
Alkaline Phosphatase: 76 U/L
BUN: 16 mg/dL (ref 7–18)
Bilirubin,Total: 0.5 mg/dL (ref 0.2–1.0)
CREATININE: 0.92 mg/dL (ref 0.60–1.30)
Calcium, Total: 9.4 mg/dL (ref 8.5–10.1)
Chloride: 103 mmol/L (ref 98–107)
Co2: 31 mmol/L (ref 21–32)
EGFR (African American): >60
Glucose: 105 mg/dL — ABNORMAL HIGH (ref 65–99)
Osmolality: 283 (ref 275–301)
Potassium: 3.6 mmol/L (ref 3.5–5.1)
SGOT(AST): 22 U/L (ref 15–37)
SGPT (ALT): 21 U/L
Sodium: 141 mmol/L (ref 136–145)
TOTAL PROTEIN: 7.1 g/dL (ref 6.4–8.2)

## 2014-07-28 LAB — CBC CANCER CENTER
BASOS PCT: 0.5 %
Basophil #: 0 x10 3/mm (ref 0.0–0.1)
Eosinophil #: 0.1 x10 3/mm (ref 0.0–0.7)
Eosinophil %: 1.8 %
HCT: 34.9 % — ABNORMAL LOW (ref 35.0–47.0)
HGB: 11.8 g/dL — ABNORMAL LOW (ref 12.0–16.0)
LYMPHS PCT: 27.7 %
Lymphocyte #: 1.6 x10 3/mm (ref 1.0–3.6)
MCH: 32.3 pg (ref 26.0–34.0)
MCHC: 33.7 g/dL (ref 32.0–36.0)
MCV: 96 fL (ref 80–100)
MONO ABS: 0.4 x10 3/mm (ref 0.2–0.9)
MONOS PCT: 6.6 %
NEUTROS ABS: 3.6 x10 3/mm (ref 1.4–6.5)
NEUTROS PCT: 63.4 %
Platelet: 220 x10 3/mm (ref 150–440)
RBC: 3.64 10*6/uL — ABNORMAL LOW (ref 3.80–5.20)
RDW: 13.5 % (ref 11.5–14.5)
WBC: 5.7 x10 3/mm (ref 3.6–11.0)

## 2014-08-04 ENCOUNTER — Ambulatory Visit: Payer: Self-pay

## 2014-08-04 DIAGNOSIS — Z9889 Other specified postprocedural states: Secondary | ICD-10-CM | POA: Diagnosis not present

## 2014-08-04 DIAGNOSIS — N201 Calculus of ureter: Secondary | ICD-10-CM | POA: Diagnosis not present

## 2014-08-04 DIAGNOSIS — N202 Calculus of kidney with calculus of ureter: Secondary | ICD-10-CM | POA: Diagnosis not present

## 2014-08-04 DIAGNOSIS — N200 Calculus of kidney: Secondary | ICD-10-CM | POA: Diagnosis not present

## 2014-08-09 ENCOUNTER — Ambulatory Visit: Payer: Self-pay

## 2014-08-09 DIAGNOSIS — N201 Calculus of ureter: Secondary | ICD-10-CM | POA: Diagnosis not present

## 2014-08-09 DIAGNOSIS — N200 Calculus of kidney: Secondary | ICD-10-CM | POA: Diagnosis not present

## 2014-08-09 DIAGNOSIS — R319 Hematuria, unspecified: Secondary | ICD-10-CM | POA: Diagnosis not present

## 2014-08-20 ENCOUNTER — Ambulatory Visit: Payer: Self-pay | Admitting: Oncology

## 2014-08-21 ENCOUNTER — Encounter: Payer: Self-pay | Admitting: General Surgery

## 2014-09-19 DIAGNOSIS — I10 Essential (primary) hypertension: Secondary | ICD-10-CM | POA: Diagnosis not present

## 2014-09-19 DIAGNOSIS — E785 Hyperlipidemia, unspecified: Secondary | ICD-10-CM | POA: Diagnosis not present

## 2014-09-19 DIAGNOSIS — E119 Type 2 diabetes mellitus without complications: Secondary | ICD-10-CM | POA: Diagnosis not present

## 2014-10-17 ENCOUNTER — Ambulatory Visit: Payer: Self-pay | Admitting: Pain Medicine

## 2014-10-17 DIAGNOSIS — M5416 Radiculopathy, lumbar region: Secondary | ICD-10-CM | POA: Diagnosis not present

## 2014-10-17 DIAGNOSIS — B0223 Postherpetic polyneuropathy: Secondary | ICD-10-CM | POA: Diagnosis not present

## 2014-10-17 DIAGNOSIS — R51 Headache: Secondary | ICD-10-CM | POA: Diagnosis not present

## 2014-10-17 DIAGNOSIS — M47892 Other spondylosis, cervical region: Secondary | ICD-10-CM | POA: Diagnosis not present

## 2014-10-17 DIAGNOSIS — M79609 Pain in unspecified limb: Secondary | ICD-10-CM | POA: Diagnosis not present

## 2014-10-17 DIAGNOSIS — M47817 Spondylosis without myelopathy or radiculopathy, lumbosacral region: Secondary | ICD-10-CM | POA: Diagnosis not present

## 2014-10-17 DIAGNOSIS — M4802 Spinal stenosis, cervical region: Secondary | ICD-10-CM | POA: Diagnosis not present

## 2014-10-23 DIAGNOSIS — J209 Acute bronchitis, unspecified: Secondary | ICD-10-CM | POA: Diagnosis not present

## 2014-10-23 DIAGNOSIS — J329 Chronic sinusitis, unspecified: Secondary | ICD-10-CM | POA: Diagnosis not present

## 2014-11-21 IMAGING — CR DG ABDOMEN 1V
1 series · 1 of 1 positions shown · non-contrast
Comparison: none

CLINICAL DATA: Known left-sided kidney stone, lithotripsy 2-3 weeks
ago

EXAM:
ABDOMEN - 1 VIEW

[kdxr kidney ureter bladder]
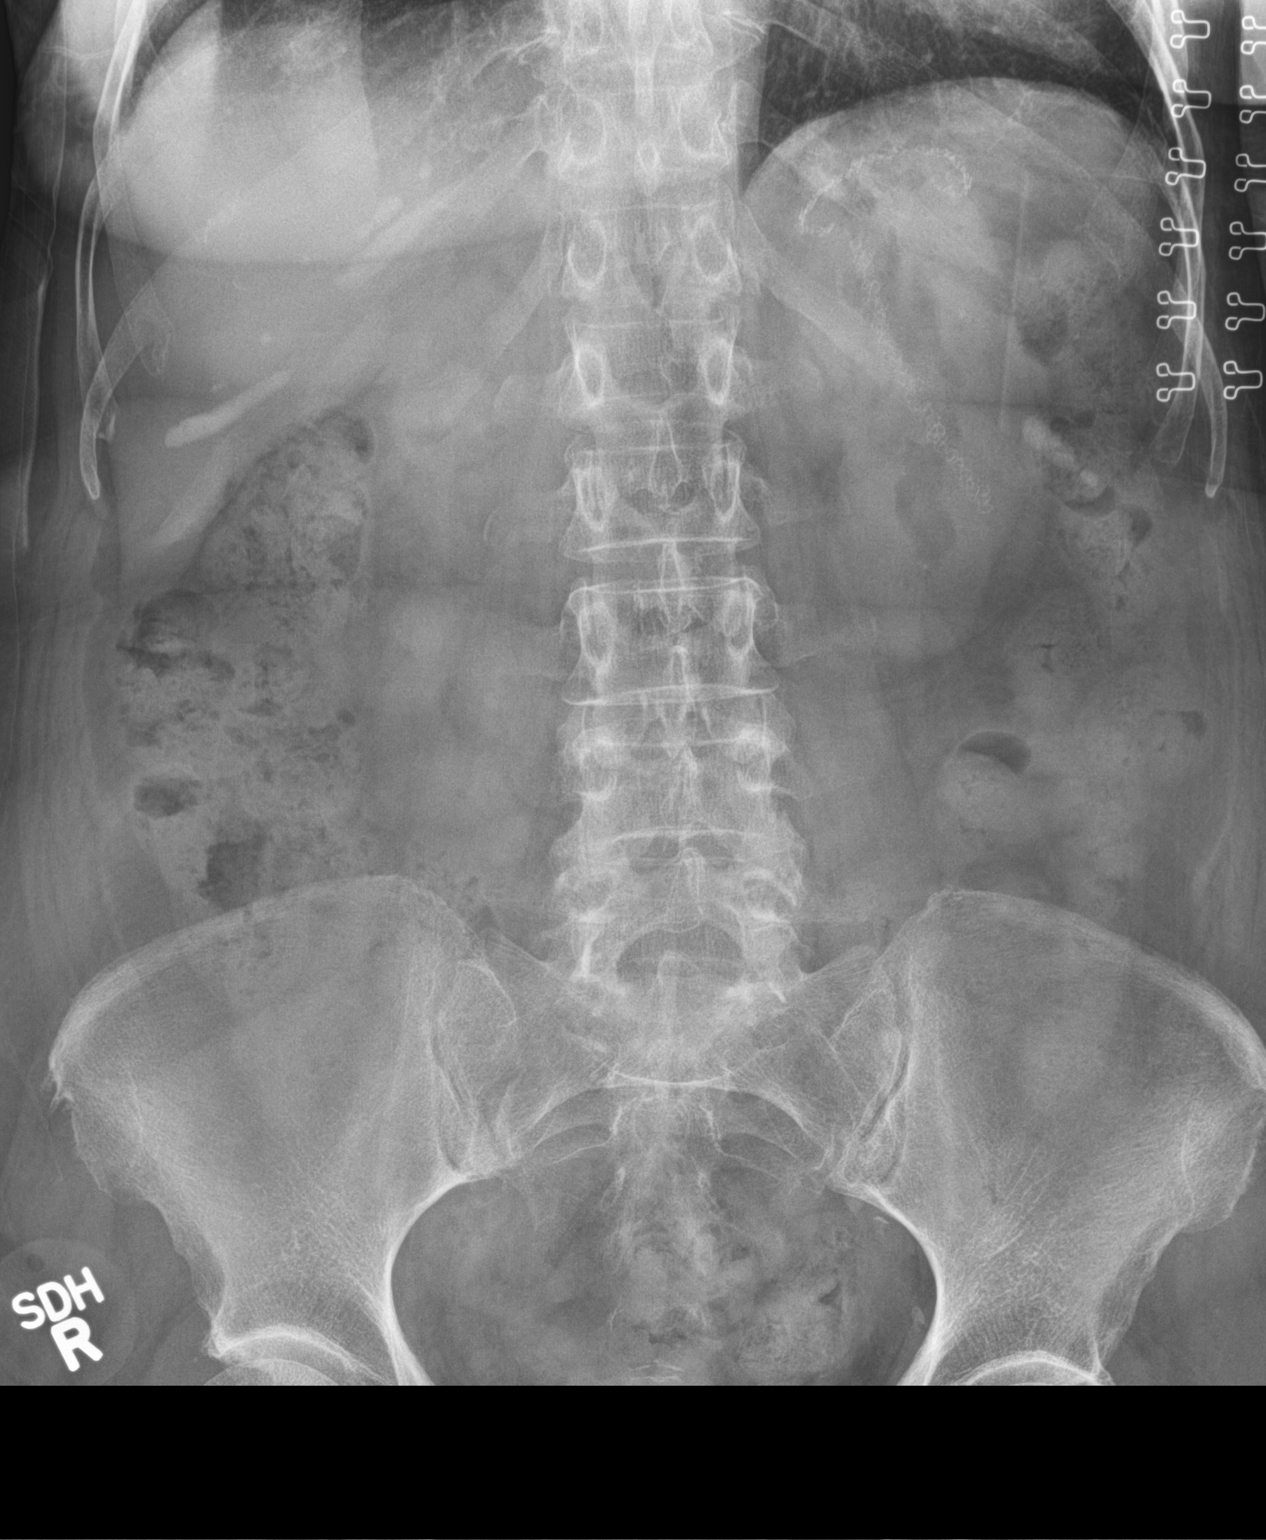

[1 of 1 positions shown; findings below may reference images not displayed]

FINDINGS: A small left lower pole renal calculus cannot be excluded. No
definite right renal calculi are seen. The 2 calcifications
overlying the left bony pelvis. On the initial CT scout film prior
to CT abdomen of 05/19/2014, only 1 pelvic calcification was seen,
and 1 of these 2 calcifications probably does represent a distal
left ureteral calculus. The bowel gas pattern is nonspecific.
IMPRESSION: 1. Little change in probable distal left ureteral calculus near the
left UV junction.
2. Tiny left lower pole renal calculus.

## 2014-11-26 IMAGING — US US RENAL KIDNEY
1 series · 14 of 25 positions shown · non-contrast
Comparison: CT, 05/19/2014.

CLINICAL DATA: Hematuria. History of kidney stones. History of
attempted lithotripsy.

EXAM:
RENAL/URINARY TRACT ULTRASOUND COMPLETE

[Series 1: us renal kidney · 0.23mm/px · 14 of 36 slices shown]
[im 1/36]
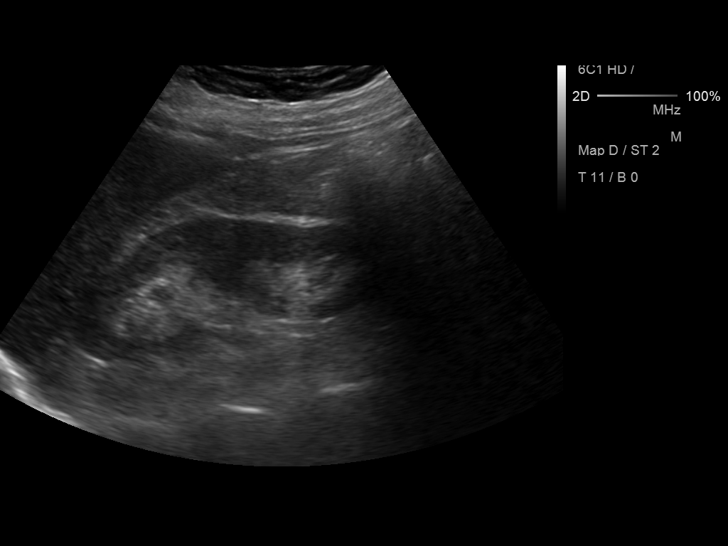
[im 3/36]
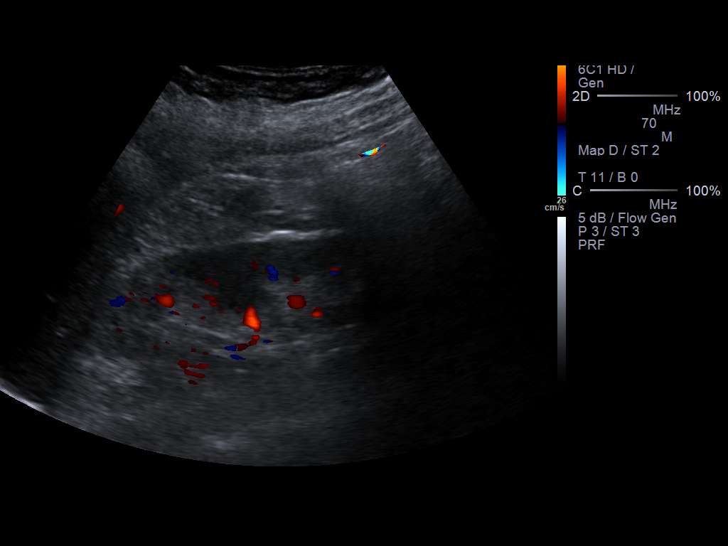
[im 6/36]
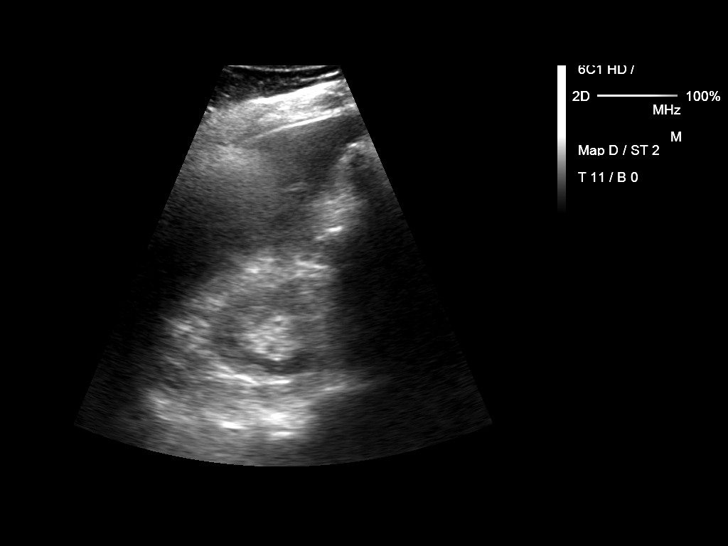
[im 9/36]
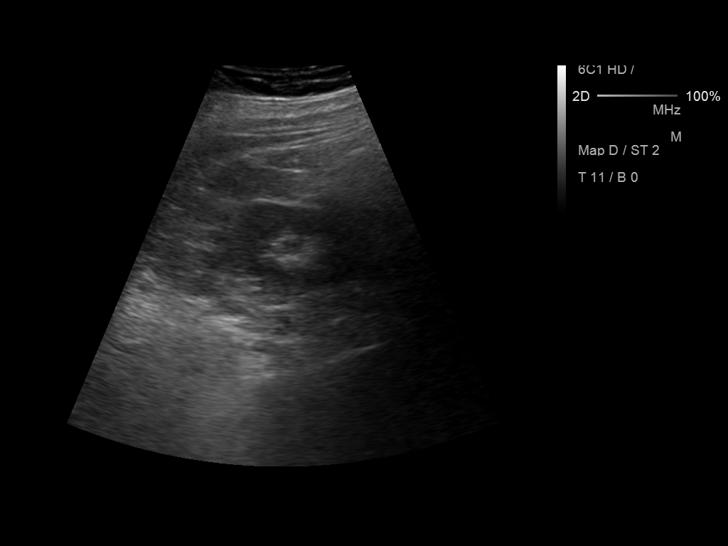
[im 12/36]
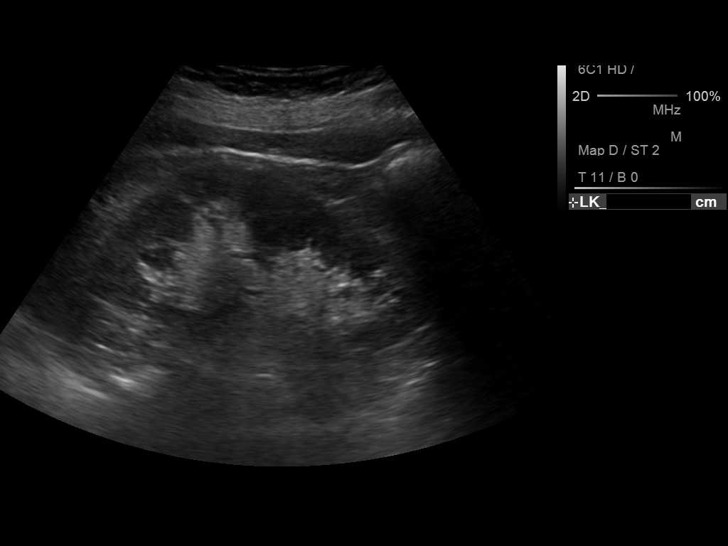
[im 14/36]
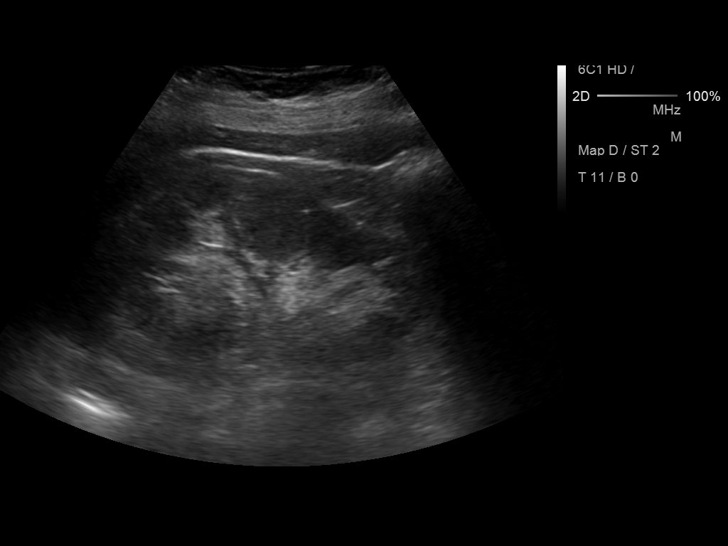
[im 17/36]
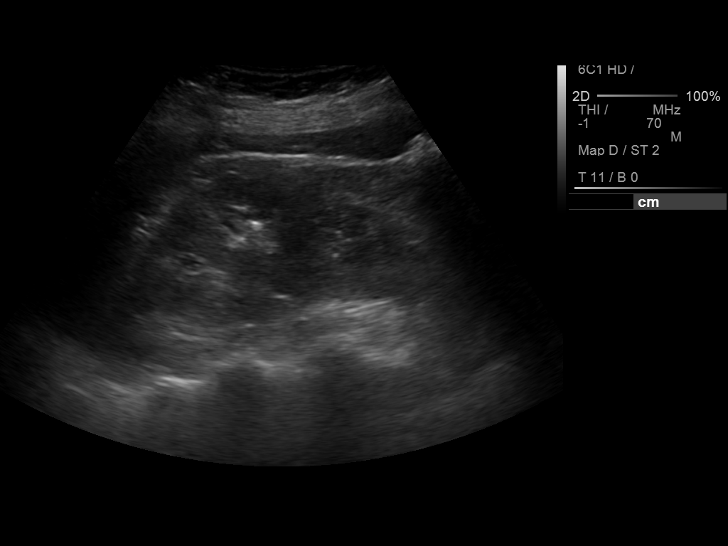
[im 19/36]
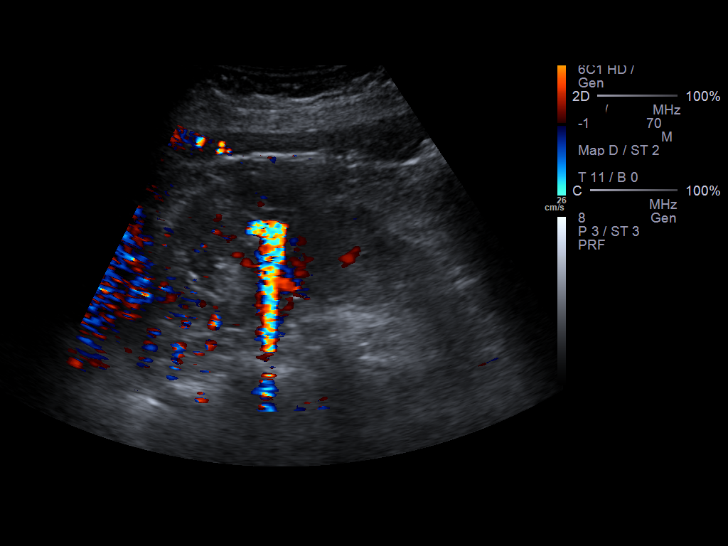
[im 22/36]
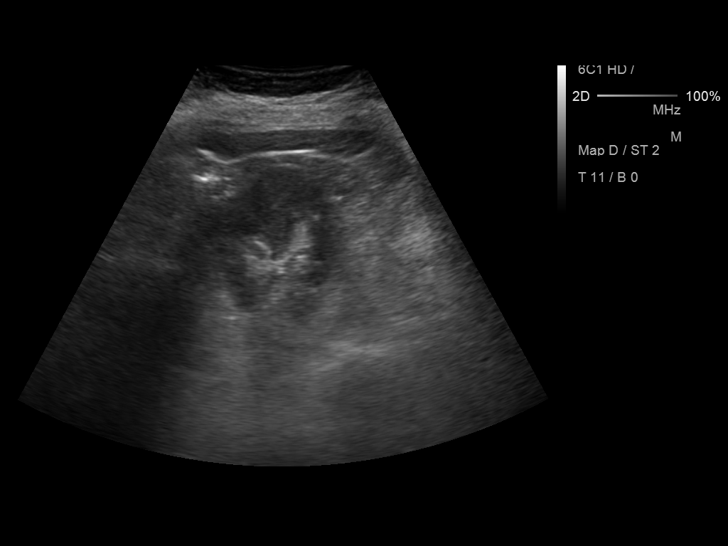
[im 24/36]
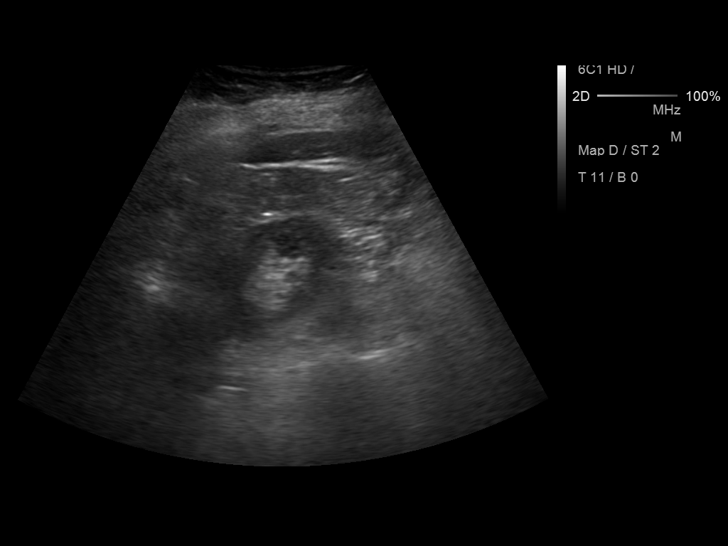
[im 27/36]
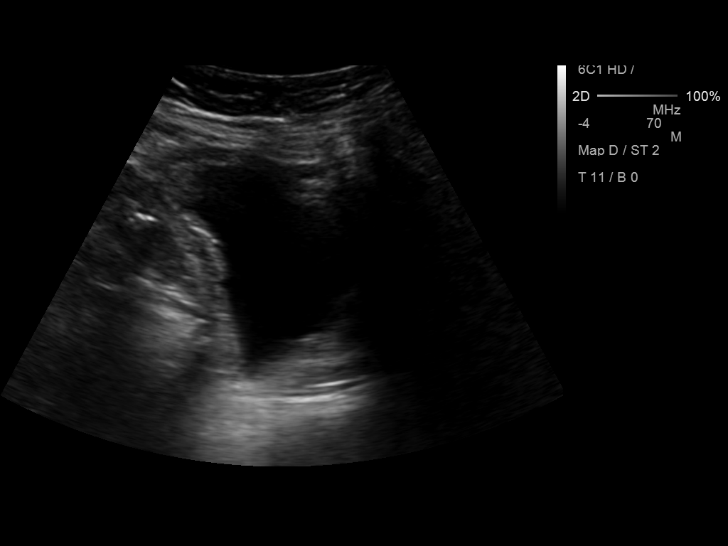
[im 30/36]
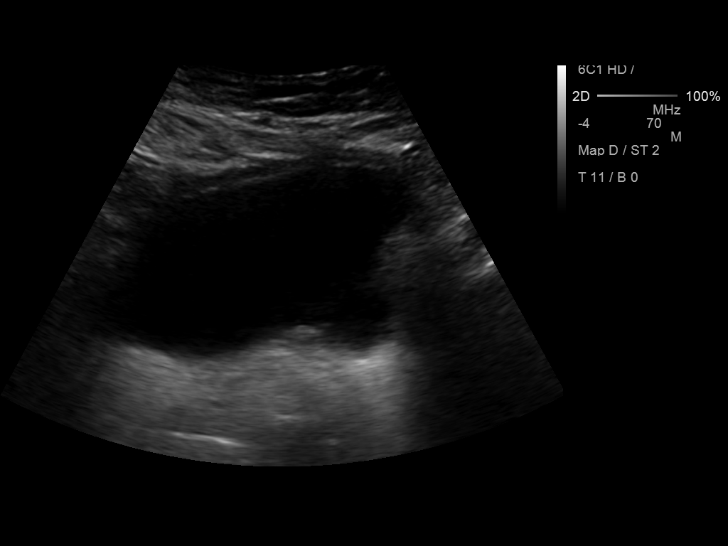
[im 33/36]
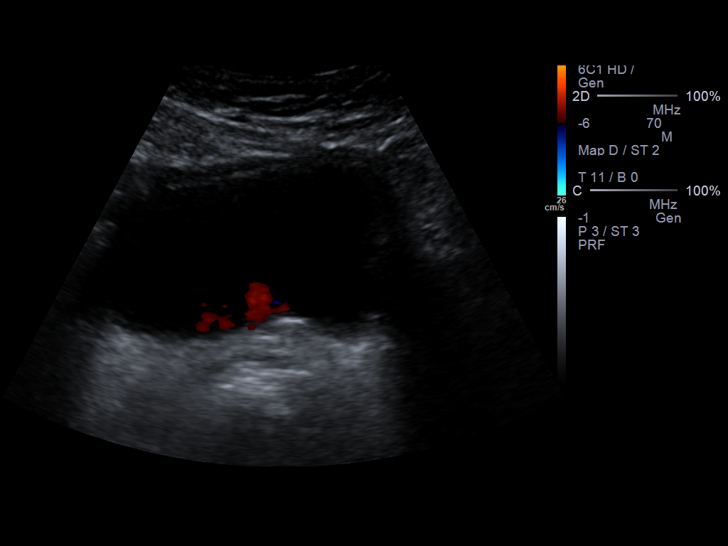
[im 36/36]
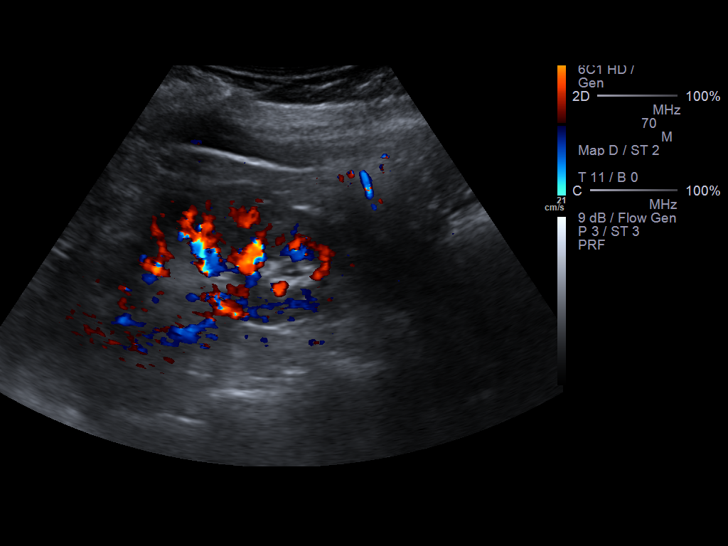

[14 of 25 positions shown; findings below may reference images not displayed]

FINDINGS: Right Kidney:

Length: 10.5 cm. Echogenicity within normal limits. No mass or
hydronephrosis visualized.

Left Kidney:

Length: 11.4 cm. Normal parenchymal echogenicity. 4 mm echogenic
focus seen in the midpole consistent with a nonobstructing stone. No
renal masses. No hydronephrosis.

Bladder:

Appears normal for degree of bladder distention.
IMPRESSION: 1. No hydronephrosis.
2. Echogenic focus in the left kidney consistent with a
nonobstructing stone.
3. No other abnormalities.

## 2015-01-16 ENCOUNTER — Ambulatory Visit: Payer: Self-pay | Admitting: Pain Medicine

## 2015-01-16 DIAGNOSIS — B0223 Postherpetic polyneuropathy: Secondary | ICD-10-CM | POA: Diagnosis not present

## 2015-01-16 DIAGNOSIS — E119 Type 2 diabetes mellitus without complications: Secondary | ICD-10-CM | POA: Diagnosis not present

## 2015-01-16 DIAGNOSIS — M47817 Spondylosis without myelopathy or radiculopathy, lumbosacral region: Secondary | ICD-10-CM | POA: Diagnosis not present

## 2015-01-16 DIAGNOSIS — Z853 Personal history of malignant neoplasm of breast: Secondary | ICD-10-CM | POA: Diagnosis not present

## 2015-01-16 DIAGNOSIS — M79604 Pain in right leg: Secondary | ICD-10-CM | POA: Diagnosis not present

## 2015-01-16 DIAGNOSIS — M79609 Pain in unspecified limb: Secondary | ICD-10-CM | POA: Diagnosis not present

## 2015-01-16 DIAGNOSIS — M545 Low back pain: Secondary | ICD-10-CM | POA: Diagnosis not present

## 2015-01-16 DIAGNOSIS — I10 Essential (primary) hypertension: Secondary | ICD-10-CM | POA: Diagnosis not present

## 2015-01-16 DIAGNOSIS — M5416 Radiculopathy, lumbar region: Secondary | ICD-10-CM | POA: Diagnosis not present

## 2015-02-10 NOTE — Discharge Summary (Signed)
PATIENT NAME:  Rachael Castro, Rachael Castro MR#:  [id number] DATE OF BIRTH:  1930/11/30  DATE OF ADMISSION:  11/11/2013  DATE OF DISCHARGE:  11/12/2013  DISCHARGE DIAGNOSES: 1.  Orthostatic presyncope.  2.  Hypertension.  3.  Dehydration.  4.  Diabetes mellitus.  5.  Hypertension.   IMAGING STUDIES: Include a CT scan of the head without contrast shows no acute abnormalities. Has mild age-related atrophy.   Echocardiogram showed EF of 60% to 65%, normal LV function, no significant valve abnormalities.   Telemetry showed no arrhythmias, bradycardia or tachycardia.   ADMITTING HISTORY AND PHYSICAL AND HOSPITAL COURSE: Please see detailed H and P dictated previously by Dr. Anselm Jungling.  In brief, an 80 year old female patient with history of hypertension, chronic pain on losartan, presented to the hospital complaining of an episode of presyncope, where her legs gave away. She fell onto the floor. Did not black out, per husband. The patient felt extremely weak. Presented to the Emergency Room, admitted to the hospitalist service. The patient's orthostatic vitals were positive. The patient was on a tele floor. Had an echo done, which was normal. Tele did not show any arrhythmias or bradycardia or tachycardia. Cardiac enzymes have been normal. The patient's orthostatics had been checked on the day of discharge, which are normal. The patient feels back to baseline. He is being discharged back home after holding her losartan, as her blood pressure is normal after stopping the medication. The patient has been advised to keep herself well-hydrated.   DISCHARGE MEDICATIONS INCLUDE:  1.  Omeprazole 40 mg daily.  2.  Vitamin D calcium 1 tablet daily.  3.  Niacin 50 mg 2 tablets daily.  4.  Aspirin 81 mg daily.  5.  Cymbalta 30 mg oral daily.  6.  Januvia 25 mg oral daily. 7.  Gabapentin 300 mg 4 to 6 capsules orally once a day if needed.  8.  Spectravite 1 tablet daily.  9.  Vitamin B complex 100 mg daily.  10.   Atorvastatin 20 mg daily.   Losartan has been stopped.   DISCHARGE INSTRUCTIONS: Low sodium diet. Activity as tolerated. Keep yourself well-hydrated. Follow up with primary care physician in 1 to 2 weeks.  Time spent on day of discharge in discharge activity was 35 minutes.   ____________________________ Leia Alf Bethanee Redondo, MD srs:mr D: 11/12/2013 14:29:49 ET T: 11/12/2013 18:44:55 ET JOB#: [id number]  cc: Alveta Heimlich R. Darvin Neighbours, MD, <Dictator> Ashok Norris, MD  Alveta Heimlich Arlice Colt MD ELECTRONICALLY SIGNED 11/15/2013 10:19

## 2015-02-10 NOTE — H&P (Signed)
PATIENT NAME:  Rachael Castro, Rachael Castro MR#:  [id number] DATE OF BIRTH:  June 19, 1931  DATE OF ADMISSION:  11/11/2013  PRIMARY CARE PHYSICIAN: Dr. Rutherford Nail.    REFERRING ER PHYSICIAN:  Dr. Lisa Roca.  PRIMARY ONCOLOGIST:  Dr.  Oliva Bustard.   CHIEF COMPLAINT: Syncopal episode.   HISTORY OF PRESENT ILLNESS: An 81 year old female who has past history of breast cancer, status post right mastectomy, next diagnosed with GIST, status post-surgery and received chemo last year, hypertension, diabetes, hyperlipidemia, who was at a store and felt suddenly dizzy and fell down on the floor with syncopal black-out episode today around 5:00 p.m. in the evening. Her husband was with her and, as per him, she never lost her consciousness. She was on the floor, alert and talking to him, and was not even confused, but she was feeling very weak and was unable to get up.  On further questioning to patient, she denies any associated headache or hearing or visual changes. She denies any associated focal weakness or numbness, no palpitations, chest pain or shortness of breath or fever. She said that she just felt some dizzy and fell down on the floor. She had some feeling of nausea and vomited once after the episode. In  ER, her initial work-up was found noncontributory and so she was given admission for further workup of syncopal episodes.   REVIEW OF SYSTEMS:   CONSTITUTIONAL: Negative of fever, fatigue, weakness, pain.  EYES: No blurring, double vision, discharge or redness.  EARS, NOSE, THROAT: No tinnitus, ear pain, no hearing loss.  RESPIRATORY: No cough, wheezing, or tightness or shortness of breath.  CARDIOVASCULAR: No chest pain, orthopnea, edema or palpitations.  GASTROINTESTINAL: No nausea, vomiting, diarrhea, abdominal pain.  GENITOURINARY: No dysuria, hematuria, or frequency.  ENDOCRINE: No heat or cold intolerance. No increased frequency of the urine.  SKIN: No acne or rashes.  MUSCULOSKELETAL: No pain or swelling in  the joints.   NEUROLOGICAL: No numbness, weakness, tremor, but had an episode of dizziness and syncopal episode today. PSYCHIATRIC: No anxiety, insomnia, bipolar disorder.   PAST MEDICAL HISTORY: 1.  History of breast cancer, status post right mastectomy.  2. History of gastrointestinal stromal tumor, status post resection and chemotherapy in May 2014.  3.  Diabetes.  4.  Hypertension.  5.  Thyroid nodule removal.  6.  History of hyperlipidemia.  7.  Gastric ulcer.    PAST SURGICAL HISTORY:  1.  Neck nodule removal. 2.  Mastectomy.  3.  Hysterectomy.  4.  Gastric ulcer.  5.  Appendectomy.  6.  Gastric tumor removal surgery.    FAMILY HISTORY: Hypertension in father and uterine cancer in mother.   SOCIAL HISTORY: No tobacco, alcohol or drug use.   HOME MEDICATIONS: 1.  Vitamin B complex 100 mg oral tablet once a day.  2.  Omeprazole 40 mg once a day.  3.  Niacin 50 mg oral tablet 2 tablets once a day.  4.  Losartan 50 mg take 1/2 tablet once a day.  5.  Januvia 25 mg oral tablet once a day.  6.  Gabapentin 300 mg oral tablet 4 to 6 capsules once a day if needed.  7.  Cymbalta 30 mg oral 1 to 2 capsules orally once a day.  8.  Calcium 600+ vitamin D once a day.  9.  Atorvastatin 20 mg once a day.  10.  Aspirin enteric-coated 81 mg once a day.   PHYSICAL EXAMINATION: VITAL SIGNS: In the ER, temperature 98.1, pulse 89,  respirations 22, blood pressure 130/61 and pulse ox 97 on room air.  GENERAL: The patient is fully alert and oriented to time, place and person. Does not appear in any acute distress.  HEENT: Head and neck atraumatic. Conjunctivae pink. Oral mucosa moist.  NECK: Supple. No JVD.  RESPIRATORY: Bilateral clear and equal air entry.  CARDIOVASCULAR: S1, S2 present, regular. No murmur.  ABDOMEN: Soft, nontender. Bowel sounds present. No organomegaly.  SKIN: No rashes, but there is scar of surgery present on the neck.  JOINTS:  No swelling or tenderness. LEGS: No  edema.  NEUROLOGICAL: Power 4/5. Follows commands. Moves all four limbs, obvious a little bit weak.   IMPORTANT LABORATORY RESULTS: Glucose 115, BUN 18, creatinine 0.73, sodium 137, potassium 3.7, chloride 105, CO2 26, calcium 9.2, troponin less than 0.002. WBC 10.7, hemoglobin 13.5, platelet count 170, MCV is 101. Urinalysis is negative.   ASSESSMENT AND PLAN: An 81 year old female with past history of cancer and chemotherapy, hypertension, diabetes had a syncopal episode today associated with some nausea and vomiting.  1.  Syncopal episode, most likely it appears to be vasovagal from the description, but we will monitor on telemetry and get her echocardiogram to rule out any cardiac origin. CT scan is not done as patient has history of cancer multiple times. Would like to have a CT scan before  pulling out any organic causes. Orthostatic vitals are done in the Emergency Room and blood pressure in lying down was 135/65 and on standing up it dropped to 116/48, so there is a component of orthostatic vital . Blood pressure drop also playing a role, but patient was already standing when this happened, so I do not believe it as a cause of her syncopal episode, but we will give her some IV fluid to help her with blood pressure.  2.   Diabetes. We will keep her on insulin sliding scale coverage and continue her Januvia.   3.  Hypertension. The patient was taking losartan 25 daily, but because of this orthostatic drop in the vital, I would like to hold losartan at this time.  4.  History of gastric ulcer. We will continue pantoprazole oral daily.  5.  Hyperlipidemia. We will continue statin and niacin.   Total time spent on this admission:  50 minutes.     ____________________________ Ceasar Lund Anselm Jungling, MD vgv:NTS D: 11/11/2013 22:51:20 ET T: 11/12/2013 00:16:41 ET JOB#: [id number]  cc: Ceasar Lund. Anselm Jungling, MD, <Dictator> Martie Lee. Oliva Bustard, MD Vaughan Basta MD ELECTRONICALLY SIGNED  11/24/2013 23:56

## 2015-02-11 NOTE — Consult Note (Signed)
PATIENT NAME:  Rachael Castro, MANGANELLO MR#:  [id number] DATE OF BIRTH:  [birth year]  DATE OF CONSULTATION:  02/27/2012  REFERRING PHYSICIAN:  Dr. Jamal Collin from surgery  CONSULTING PHYSICIAN:  Vivien Presto, MD  PRIMARY CARE PHYSICIAN: Dr. Rutherford Nail  REASON FOR CONSULTATION: Near syncopal episode.   HISTORY OF PRESENT ILLNESS: Patient is an 81 year old African American female with history of diabetes, hypertension, hyperlipidemia who underwent abdominal surgery this morning for removal of a tumor, likely GIST. Patient was in her room attempting to go to the bathroom. Patient was being assisted by a CNA. This patient was being brought back to bed after she voided. She became pale and was slow to respond and became hypotensive and limp without loss of consciousness or syncopal episode. The episode lasted several minutes and rapid response was called. Patient received some IV fluids and pressures have stabilized. Patient had a blood pressure reading of 62/40 and currently is 115/65. Low blood pressure episode occurred about 6:20 p.m. Patient currently is neurologically intact and answers appropriately. She states that she became dizzy after she stood up. She denies any visual changes at that time, chest pains or shortness of breath or palpitations. Hospitalist service was contacted for further evaluation.   PAST MEDICAL HISTORY:  1. History of breast cancer status post right mastectomy. 2. Hypertension. 3. Diabetes.  4. Thyroid nodule removal.  5. Hyperlipidemia. 6. History of gastric ulcers.   PAST SURGICAL HISTORY:  1. Neck nodule removal. 2. Mastectomy. 3. Hysterectomy. 4. Gastric ulcers.  5. Appendectomy.  6. Gastric tumor removal today.   MEDICATIONS AS OUTPATIENT:  1. Aspirin 81 mg daily.  2. Calcium 600 with D 1 cap daily. 3. Cymbalta 30 mg daily.  4. Gabapentin 300 mg 2 caps 4 times a day as needed.  5. Losartan 50 mg 1 tab daily.  6. Metformin 1000 mg b.i.d.  7. Niaspan ER 1000 mg 2 tabs  daily. 8. Omeprazole 40 mg daily.  9. Pravastatin 40 mg daily.  10. Spectravite Senior 1 cap daily.  11. Vitamin B complex 100, 1 cap daily.   ALLERGIES: No known drug allergies.   FAMILY HISTORY: Hypertension in dad and uterine cancer in mom.   SOCIAL HISTORY: No tobacco, alcohol or drug use. Is married.    REVIEW OF SYSTEMS: CONSTITUTIONAL: No fever, fatigue. Feels somewhat weak. EYES: No blurry vision or double vision. ENT: No tinnitus or hearing loss. RESPIRATORY: No cough, wheezing or shortness of breath. CARDIOVASCULAR: No chest pain, edema. No dyspnea on exertion. No palpitations. GASTROINTESTINAL: Had decreased p.o. intake and nausea prior. Currently has some abdominal soreness at site of surgery but no pain. GENITOURINARY: Denies dysuria or hematuria. ENDOCRINE: Denies polyuria or thyroid problems. HEME/LYMPH: No anemia or easy bruising. SKIN: Denies rashes. MUSCULOSKELETAL: Denies arthritis or gout. NEUROLOGIC: Denies cerebrovascular accident or transient ischemic attack. PSYCH: Has depression.   PHYSICAL EXAMINATION:  VITAL SIGNS: Last temperature 98.1, currently heart rate is 67, respiratory rate 16, blood pressure 115/65, however, it was as low as 62/40 around 6:20 p.m., oxygen saturation 99% on 2 liters.   GENERAL: Patient is a lethargic African American female lying in bed, no obvious distress, talking in full sentences, pleasant.   HEENT: Normocephalic, atraumatic. Pupils are equal and reactive. Anicteric sclerae. Dry mucous membranes.   NECK: Supple. No thyroid tenderness. No lymphadenopathy.   CARDIOVASCULAR: S1, S2. No murmurs, rubs, or gallops.   LUNGS: Clear to auscultation.   ABDOMEN: Soft. Hypoactive bowel sounds in all quadrants. There is a bandage  upper epigastric area midline. No rebound or guarding. Slight tenderness around a the surgical area.   EXTREMITIES: No significant lower extremity edema.   NEUROLOGICAL: Cranial nerves II through XII grossly intact.  Strength 5/5 in all extremities. Sensation intact to light touch.   PSYCH: Awake, alert, oriented x3, pleasant, cooperative.   LABORATORY, DIAGNOSTIC AND RADIOLOGICAL DATA: Glucose was 172 in the morning. Hemoglobin currently 7.7, hematocrit 23, hemoglobin 12.1 on 05/06, hematocrit was 36.1, platelets 124, it was 147 on 05/06. WBC 8.5. CT of abdomen and pelvis with contrast on 05/03 had shown abnormal soft tissue density mass with a gastric lumen which measured 6.2 x 5.4 cm. This lies in the cardia. No perigastric lymphadenopathy nor evidence of lymphadenopathy elsewhere. No evidence of obstruction or definitive evidence of ileus. May be element of constipation. No adrenal or hepatic masses. Trace of intrahepatic ductal dilation.   ASSESSMENT AND PLAN: We have a 81 year old African American female with hypertension, hyperlipidemia, diabetes and breast cancer status post mastectomy status post resection of a large gastric tumor, likely GIST tumor per Dr. Jamal Collin, with probable presyncopal episode likely vasovagal. Patient had transient dizziness, paleness and hypotensive episode which appears to have resolved and patient's blood pressure although not at her previous levels before the episode has stabilized with IV fluids. Patient's IV fluid was increased from 100 mL/h of LR to 250 mL/h. That was about two hours ago. At this point I would continue IV fluids for now and check orthostatic vital signs. If patient persists to be orthostatic I have discussed the case with surgery as well as the patient and he would go ahead and transfuse 2 units of PRBC for acute blood loss anemia, possibly surgical related. Patient does not have any acute bleed currently. Mentally she is back to her baseline. There was no syncope or passing out. I would continue to hold the losartan. Patient has consented for blood transfusion by me. Pros and cons are explained to patient the patient voiced understanding. In regards to her diabetes,  I would start the patient on sliding scale insulin and check a hemoglobin A1c in the morning. Also check a CBC in the morning. In regards to the gastric tumor, Dr. Jamal Collin stated that patient will likely need oncology consult as an outpatient. I would defer postsurgical care to surgery at this point.   CODE STATUS: FULL CODE.   TOTAL TIME SPENT: 60 minutes.   ____________________________ Vivien Presto, MD sa:cms D: 02/27/2012 21:08:23 ET T: 02/28/2012 15:27:27 ET JOB#: [id number]  cc: Vivien Presto, MD, <Dictator> Ashok Norris, MD Vivien Presto MD ELECTRONICALLY SIGNED 03/05/2012 16:31

## 2015-02-11 NOTE — Op Note (Signed)
PATIENT NAME:  Rachael Castro, Rachael Castro MR#:  [id number] DATE OF BIRTH:  05-30-1931  DATE OF PROCEDURE:  02/27/2012  PREOPERATIVE DIAGNOSIS: Gastric wall mass, suspected to be gastrointestinal stromal tumor (GIST).   POSTOPERATIVE DIAGNOSIS: Gastric wall mass, suspected to be gastrointestinal stromal tumor (GIST).   OPERATION: Laparotomy, excision of gastric wall mass.   SURGEON: Mckinley Jewel, MD   ASSISTANT: Hervey Ard, MD   ANESTHESIA: General.   COMPLICATIONS: None.   ESTIMATED BLOOD LOSS: Approximately 300 mL.  DESCRIPTION OF PROCEDURE:  The patient was put to sleep in the supine position on the operating room table.  The abdomen was prepped and draped out as a sterile field. A vertical midline incision was made from the xiphoid down towards the umbilicus and carefully deepened through the layers into the peritoneal cavity. Bleeding was controlled with cautery. Palpation of the upper abdomen showed no finding of a palpable mass located in the proximal portion of the stomach. The patient did have some adhesions in the lower abdomen from prior surgery. After placing a self-retaining retractor, the stomach was adequately exposed. The mass in question was brought up, and it was noted that it seemed to be anchored to the posterior wall area near the lesser curvature, but it was fairly mobile, almost like a large polypoid mass even though this was a submucous mass. In view of this, it was decided to do a gastrotomy and do an excision from within. Accordingly, a little linear gastrotomy was made with cautery. The mass in question was delivered through this area, and its base was adequately exposed. A TA-90 stapler was then utilized to take down the base of this mass which measured a little over 6 cm in size. This was sent to pathology for processing immediately.  The staple line was inspected within the gastric lumen. A few bleeding points were cauterized and/or suture ligated with 3-0 Vicryl. After  ensuring hemostasis, an OG tube was positioned and this was then removed at the end of the procedure after suctioning out the stomach. The gastrotomy was also closed with a TA-90 stapling device with reinforcement of the suture line with a few interrupted 3-0 Vicryl stitches. Irrigation was then utilized and all fluid suctioned out. Several pieces of clot from some bleeding were encountered, both from the gastrotomy and the excision sites were noted. These were all removed, and after ensuring hemostasis the abdomen was closed. Sponges and instruments were removed. The peritoneal layer was closed with a running 0 Vicryl. The fascia was closed with interrupted figure-of-eight stitches of 0 Prolene. The subcutaneous tissue was irrigated and closed with 3-0 Vicryl and the skin closed with staples. Dry sterile dressing was placed. The patient tolerated the procedure well. There were no immediate problems encountered. She was subsequently returned to the recovery room in stable condition.   ____________________________ S.Robinette Haines, MD sgs:cbb D: 03/01/2012 10:05:27 ET T: 03/01/2012 15:57:13 ET JOB#: [id number]  cc: Synthia Innocent. Jamal Collin, MD, <Dictator> [facility] Robinette Haines MD ELECTRONICALLY SIGNED 03/03/2012 8:34

## 2015-02-11 NOTE — Consult Note (Signed)
Brief Consult Note: Diagnosis: pre-syncope likely vasovagal.   Patient was seen by consultant.   Consult note dictated.   Recommend further assessment or treatment.   Orders entered.   Discussed with Attending MD.   Comments: 81 yo s/p surgaicl resection of mass today likly GIST with hx of htn, dm, hld and had presyncopal episode without LOC/fall.  as ambulating from bed tobathroom andabout to sit down on commode pt became pale/dizzy and transient decrease in bp.  now better.  neurologically incact.  i would continue some hydration. get orthostatic vitals and continue holding bp meds.   hgb also 7.7. was 12 few days ago.  d/w with pt/dr sankar. per surgery,  if pt is orthostatic order 2u prbc. if not will recheck cbc in am and follow hgb and transfuse if persistnatly low.   i d/w pt pros and cons and pt is consented by me. pt aware and verbalized understanding.  wold start pt on ssi and check aic for dm.  Electronic Signatures: Vivien Presto (MD)  (Signed 10-May-13 20:56)  Authored: Brief Consult Note   Last Updated: 10-May-13 20:56 by Vivien Presto (MD)

## 2015-02-11 NOTE — Discharge Summary (Signed)
PATIENT NAME:  Rachael Castro, PEROT MR#:  [id number] DATE OF BIRTH:  1931/07/02  DATE OF ADMISSION:  02/27/2012 DATE OF DISCHARGE:  03/02/2012  HISTORY OF PRESENT ILLNESS: This is a 81 year old female who had previous history of breast cancer treated with mastectomy and had done well over the years. In the last several months she had reported some mild discomfort in the epigastric region and because of this she underwent an endoscopy. She had had a previous ultrasound that showed no evidence of gallstones or other potential explanations for this pain. The pain was not anything severe but it seemed to occur mostly after she ate and endoscopy was then performed showing that she had a large gastric mass submucous in nature occupying the portion just past the GE junction area. Following this, the patient had endoscopic ultrasound and biopsy suggesting that this was a gist. CT scan was also completed showing that this was just a solid mass arising from the gastric wall almost having a polypoid appearance but did not seem to have any evidence of mass elsewhere in the abdomen. A recommendations was therefore made for local resection of this and the procedure was explained in full to the patient including the risks and benefits associated and the suspected diagnosis. The patient was agreeable.  PAST MEDICAL HISTORY: In addition to her breast cancer:  1. Hypertension.  2. Diabetes, type II. 3. Remote history of gastric ulcers.  4. Prior appendectomy. 5. Hysterectomy.   PHYSICAL EXAMINATION: GENERAL: Fairly healthy appearing female for her age, alert and oriented. No lymphadenopathy was noted in the neck, axilla, or groins. LUNGS: Clear to auscultation and percussion. HEART: Sinus rhythm without any murmurs. ABDOMEN: Abdominal exam revealed no palpable mass. No tenderness. Bowel sounds were active.   COURSE IN HOSPITAL: After routine preparation, the patient was taken to the operating room on 02/27/2012 and underwent  a laparotomy with excision of a gastric wall mass. This was associated with the posterior wall close to the lesser curvature in the upper portion of the stomach. The procedure was done via gastrotomy and the polypoid-like submucous mass was transected using a stapler from within and the gastrotomy was closed. Postoperatively the patient was maintained on NG suction for 48 hours after which this was removed. Late in the postoperative period on the same day of surgery, the patient had gotten up to use the bathroom and when straining felt somewhat dizzy and was presyncopal. A rapid response was initiated and the patient subsequently was noted to return back to normal. Initially it was hard to feel, get any blood pressure or pulse but the pulse returned. She was not tachycardic but her blood pressure was a little low. The patient was adequately hydrated at that time. Internal Medicine consult was obtained in view of this event likely felt to be either vasovagal or a presyncopal episode. It was also noted the patient had dropped her hemoglobin to a 7; preoperatively it was a little over 11. There was no suspicion that she had any ongoing bleeding at this time. The patient did have a 300 mL blood loss at the time of the procedure mostly from the gastric opening and transection sites which required reinforcing stitches. The patient, however, was transfused a couple of units of blood and stayed stable thereafter and had no further episodes. After 48 hours of NG suction this was discontinued. She was started on a liquid diet and advanced gradually to solid food with which she seemed to do very well and  was discharged home in stable condition on 03/02/2012 to be followed as an outpatient.   FINAL DIAGNOSES:  1. 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 2. Prior history of breast cancer.  3. Hypertension.  4. Diabetes.  OPERATION PERFORMED:  1. Laparotomy. 2. Resection  of gastric wall mass.   ____________________________ S.Robinette Haines, MD sgs:drc D: 03/16/2012 14:30:35 ET T: 03/17/2012 09:15:21 ET JOB#: [id number]  cc: Synthia Innocent. Jamal Collin, MD, <Dictator> [facility] Robinette Haines MD ELECTRONICALLY SIGNED 03/17/2012 14:25

## 2015-02-14 ENCOUNTER — Other Ambulatory Visit: Payer: Self-pay | Admitting: Hematology and Oncology

## 2015-02-14 ENCOUNTER — Ambulatory Visit
Admit: 2015-02-14 | Disposition: A | Discharge status: Home / Self Care | Payer: Self-pay | Attending: Hematology and Oncology | Admitting: Hematology and Oncology

## 2015-02-14 DIAGNOSIS — R0602 Shortness of breath: Secondary | ICD-10-CM | POA: Diagnosis not present

## 2015-02-14 DIAGNOSIS — Z9011 Acquired absence of right breast and nipple: Secondary | ICD-10-CM | POA: Diagnosis not present

## 2015-02-14 DIAGNOSIS — Z8711 Personal history of peptic ulcer disease: Secondary | ICD-10-CM | POA: Diagnosis not present

## 2015-02-14 DIAGNOSIS — Z87442 Personal history of urinary calculi: Secondary | ICD-10-CM | POA: Diagnosis not present

## 2015-02-14 DIAGNOSIS — Z853 Personal history of malignant neoplasm of breast: Secondary | ICD-10-CM | POA: Diagnosis not present

## 2015-02-14 DIAGNOSIS — D492 Neoplasm of unspecified behavior of bone, soft tissue, and skin: Secondary | ICD-10-CM

## 2015-02-14 DIAGNOSIS — Z800 Family history of malignant neoplasm of digestive organs: Secondary | ICD-10-CM | POA: Diagnosis not present

## 2015-02-14 DIAGNOSIS — E119 Type 2 diabetes mellitus without complications: Secondary | ICD-10-CM | POA: Diagnosis not present

## 2015-02-14 DIAGNOSIS — Z79899 Other long term (current) drug therapy: Secondary | ICD-10-CM | POA: Diagnosis not present

## 2015-02-14 DIAGNOSIS — D481 Neoplasm of uncertain behavior of connective and other soft tissue: Secondary | ICD-10-CM | POA: Diagnosis not present

## 2015-02-14 DIAGNOSIS — Z9049 Acquired absence of other specified parts of digestive tract: Secondary | ICD-10-CM | POA: Diagnosis not present

## 2015-02-14 DIAGNOSIS — Z8049 Family history of malignant neoplasm of other genital organs: Secondary | ICD-10-CM | POA: Diagnosis not present

## 2015-02-14 DIAGNOSIS — E041 Nontoxic single thyroid nodule: Secondary | ICD-10-CM | POA: Diagnosis not present

## 2015-02-14 DIAGNOSIS — I10 Essential (primary) hypertension: Secondary | ICD-10-CM | POA: Diagnosis not present

## 2015-02-14 DIAGNOSIS — Z7982 Long term (current) use of aspirin: Secondary | ICD-10-CM | POA: Diagnosis not present

## 2015-02-14 DIAGNOSIS — Z90710 Acquired absence of both cervix and uterus: Secondary | ICD-10-CM | POA: Diagnosis not present

## 2015-02-14 LAB — CBC CANCER CENTER
Basophil #: 0.1 x10 3/mm (ref 0.0–0.1)
Basophil %: 0.8 %
Eosinophil #: 0.1 x10 3/mm (ref 0.0–0.7)
Eosinophil %: 1.5 %
HCT: 36 % (ref 35.0–47.0)
HGB: 12.3 g/dL (ref 12.0–16.0)
Lymphocyte #: 2.5 x10 3/mm (ref 1.0–3.6)
Lymphocyte %: 29.3 %
MCH: 31.9 pg (ref 26.0–34.0)
MCHC: 34 g/dL (ref 32.0–36.0)
MCV: 94 fL (ref 80–100)
Monocyte #: 0.4 x10 3/mm (ref 0.2–0.9)
Monocyte %: 4.3 %
Neutrophil #: 5.4 x10 3/mm (ref 1.4–6.5)
Neutrophil %: 64.1 %
Platelet: 201 x10 3/mm (ref 150–440)
RBC: 3.85 10*6/uL (ref 3.80–5.20)
RDW: 13.1 % (ref 11.5–14.5)
WBC: 8.4 x10 3/mm (ref 3.6–11.0)

## 2015-02-14 LAB — COMPREHENSIVE METABOLIC PANEL
Albumin: 4 g/dL
Alkaline Phosphatase: 81 U/L
Anion Gap: 9 (ref 7–16)
BUN: 17 mg/dL
Bilirubin,Total: 0.6 mg/dL
Calcium, Total: 9.3 mg/dL
Chloride: 101 mmol/L
Co2: 27 mmol/L
Creatinine: 0.66 mg/dL
EGFR (African American): >60
EGFR (Non-African Amer.): >60
Glucose: 141 mg/dL — ABNORMAL HIGH
Potassium: 4 mmol/L
SGOT(AST): 23 U/L
SGPT (ALT): 14 U/L
Sodium: 137 mmol/L
Total Protein: 7.2 g/dL

## 2015-02-19 DIAGNOSIS — E11319 Type 2 diabetes mellitus with unspecified diabetic retinopathy without macular edema: Secondary | ICD-10-CM | POA: Diagnosis not present

## 2015-03-12 DIAGNOSIS — I10 Essential (primary) hypertension: Secondary | ICD-10-CM | POA: Diagnosis not present

## 2015-03-12 DIAGNOSIS — E785 Hyperlipidemia, unspecified: Secondary | ICD-10-CM | POA: Diagnosis not present

## 2015-03-12 DIAGNOSIS — E119 Type 2 diabetes mellitus without complications: Secondary | ICD-10-CM | POA: Diagnosis not present

## 2015-03-12 DIAGNOSIS — H269 Unspecified cataract: Secondary | ICD-10-CM | POA: Diagnosis not present

## 2015-03-28 DIAGNOSIS — H2512 Age-related nuclear cataract, left eye: Secondary | ICD-10-CM | POA: Diagnosis not present

## 2015-04-09 ENCOUNTER — Telehealth: Payer: Self-pay | Admitting: Hematology and Oncology

## 2015-04-09 NOTE — Telephone Encounter (Signed)
02/14/15 Dr. Mike Gip entered order in [facility] for pt to have left mammo in September, 2016. Also, in same order pt to have CT abd/pelvis at the end of July, 2016. The order was never entered in [place] and schedulers have been unable to complete order as a result. Please enter order asap so we can inform pt of when her appts are. Thanks.

## 2015-04-10 ENCOUNTER — Other Ambulatory Visit: Payer: Self-pay | Admitting: Hematology and Oncology

## 2015-04-10 DIAGNOSIS — Z853 Personal history of malignant neoplasm of breast: Secondary | ICD-10-CM

## 2015-04-10 DIAGNOSIS — Z85028 Personal history of other malignant neoplasm of stomach: Secondary | ICD-10-CM

## 2015-04-10 NOTE — Telephone Encounter (Signed)
She called to find out when her mammo and CT are scheduled. I am still waiting for Dr. Mike Gip to enter orders in Epic so that I can schedule them. I told pt I will contact her as soon as I can get those appointments for her and advised that the CT will be in July and the [facility] in September, per Dr. Kem Parkinson original orders in [facility].

## 2015-04-16 NOTE — Telephone Encounter (Signed)
Pt's CT scan is scheduled for 04/17/15 and her mammogram is scheduled for 07/19/15.Marland KitchenMarland Kitchen

## 2015-04-17 ENCOUNTER — Encounter: Payer: Self-pay | Admitting: Pain Medicine

## 2015-04-17 ENCOUNTER — Ambulatory Visit: Payer: Medicare Other | Admitting: Pain Medicine

## 2015-04-17 ENCOUNTER — Ambulatory Visit
Admission: RE | Admit: 2015-04-17 | Discharge: 2015-04-17 | Disposition: A | Discharge status: Home / Self Care | Payer: Medicare Other | Source: Ambulatory Visit | Attending: Hematology and Oncology | Admitting: Hematology and Oncology

## 2015-04-17 VITALS — BP 132/76 | HR 52 | Temp 97.8°F | Resp 16 | Ht 64.0 in | Wt 133.0 lb

## 2015-04-17 DIAGNOSIS — M5136 Other intervertebral disc degeneration, lumbar region: Secondary | ICD-10-CM | POA: Insufficient documentation

## 2015-04-17 DIAGNOSIS — B0229 Other postherpetic nervous system involvement: Secondary | ICD-10-CM

## 2015-04-17 DIAGNOSIS — N200 Calculus of kidney: Secondary | ICD-10-CM | POA: Insufficient documentation

## 2015-04-17 DIAGNOSIS — M5416 Radiculopathy, lumbar region: Secondary | ICD-10-CM | POA: Diagnosis not present

## 2015-04-17 DIAGNOSIS — M47817 Spondylosis without myelopathy or radiculopathy, lumbosacral region: Secondary | ICD-10-CM | POA: Diagnosis not present

## 2015-04-17 DIAGNOSIS — Z85028 Personal history of other malignant neoplasm of stomach: Secondary | ICD-10-CM | POA: Diagnosis not present

## 2015-04-17 DIAGNOSIS — B0223 Postherpetic polyneuropathy: Secondary | ICD-10-CM | POA: Diagnosis not present

## 2015-04-17 DIAGNOSIS — M5030 Other cervical disc degeneration, unspecified cervical region: Secondary | ICD-10-CM

## 2015-04-17 DIAGNOSIS — M79609 Pain in unspecified limb: Secondary | ICD-10-CM | POA: Diagnosis not present

## 2015-04-17 MED ORDER — DULOXETINE HCL 30 MG PO CPEP: ORAL_CAPSULE | ORAL | Status: DC

## 2015-04-17 MED ORDER — GABAPENTIN (ONCE-DAILY) 300 MG PO TABS: ORAL_TABLET | ORAL | Status: DC

## 2015-04-17 NOTE — Patient Instructions (Addendum)
Continue present medications Neurontin and Cymbalta  F/U PCP, Dr. Rutherford Nail, for evaliation of  BP and general medical  condition.  F/U surgical evaluation.  F/U neurological evaluation.  May consider radiofrequency rhizolysis or intraspinal procedures pending response to present treatment and F/U evaluation.  Patient to call Pain Management Center should patient have concerns prior to scheduled return appointment. Pain Management Discharge Instructions  General Discharge Instructions :  If you need to reach your doctor call: Monday-Friday 8:00 am - 4:00 pm at [phone] or toll free [phone].  After clinic hours [phone] to have operator reach doctor.  Bring all of your medication bottles to all your appointments in the pain clinic.  To cancel or reschedule your appointment with Pain Management please remember to call 24 hours in advance to avoid a fee.  Refer to the educational materials which you have been given on: General Risks, I had my Procedure. Discharge Instructions, Post Sedation.  Post Procedure Instructions:  The drugs you were given will stay in your system until tomorrow, so for the next 24 hours you should not drive, make any legal decisions or drink any alcoholic beverages.  You may eat anything you prefer, but it is better to start with liquids then soups and crackers, and gradually work up to solid foods.  Please notify your doctor immediately if you have any unusual bleeding, trouble breathing or pain that is not related to your normal pain.  Depending on the type of procedure that was done, some parts of your body may feel week and/or numb.  This usually clears up by tonight or the next day.  Walk with the use of an assistive device or accompanied by an adult for the 24 hours.  You may use ice on the affected area for the first 24 hours.  Put ice in a Ziploc bag and cover with a towel and place against area 15 minutes on 15 minutes off.  You may  switch to heat after 24 hours.

## 2015-04-17 NOTE — Progress Notes (Signed)
Safety precautions to be maintained throughout the outpatient stay will include: orient to surroundings, keep bed in low position, maintain call bell within reach at all times, provide assistance with transfer out of bed and ambulation.  

## 2015-04-17 NOTE — Progress Notes (Signed)
   Subjective:    Patient ID: Rachael Castro, female    DOB: [birth year], 81 y.o.   MRN: [id number]  HPI Patient is an 80 year old female who returns to pain management for further evaluation and treatment of pain involving the lower back and lower extremity region especially especially on the right with prior history of shingles with what is felt to be component of postherpetic neuralgia involving the right lower extremity. Patient states that the pain is fairly well-controlled with Neurontin and Cymbalta. Patient also with history of pain involving the cervical region with headaches with degenerative disc disease of the cervical spine status post interventional treatment of the cervical region with pain of the cervical region and headaches well-controlled at this time. We discussed patient's condition and will avoid interventional treatment and will consider for modifications of treatment regimen pending response to the present treatment was understanding and in agreement with suggested treatment plan.      Physical exam   There was tenderness to palpation of the splenius capitis and occipitalis musculature region of mild degree. No new lesions of the head and neck were noted. There was minimal tenderness of the cervical facet cervical paraspinal musculature region and thoracic facet thoracic paraspinal musculature region. Outpatient of the acromioclavicular and glenohumeral joint regions were with minimal tenderness to palpation. Patient appeared to be with bilaterally equal grip strength. Palpation of the thoracic facet thoracic paraspinal musculature region was a tends to palpation of minimal degree. There was minimal tennis over the lumbar facet lumbar paraspinal musculature region and straight leg raising was tolerates approximately 30 without increase of pain with dorsiflexion noted. There was mild tenderness over the PSIS PSIS regions. There was negative clonus negative Homans. Abdomen was  nontender and no costovertebral angle tenderness was noted.     Review of Systems     Objective:   Physical Exam  (See above)      Assessment & Plan:  Postherpetic neuralgia Lumbar and lower extremity region on the right  Degenerative disc disease lumbar spine  Degenerative disc disease cervical spine C3-4 degenerative changes with foraminal stenosis on the left C5-6 stenosis with right C7 stenosis with multilevel degenerative changes noted throughout the cervical spine    Plan   Continue present medications Neurontin and Cymbalta  F/U PCP Dr. Lucita Lora for evaliation of  BP and general medical  condition.  F/U surgical evaluation  F/U neurological evaluation  May consider radiofrequency rhizolysis or intraspinal procedures pending response to present treatment and F/U evaluation.  Patient to call Pain Management Center should patient have concerns prior to scheduled return appointment.

## 2015-04-24 ENCOUNTER — Encounter
Admission: RE | Admit: 2015-04-24 | Discharge: 2015-04-24 | Disposition: A | Discharge status: Home / Self Care | Payer: Medicare Other | Source: Ambulatory Visit | Attending: Ophthalmology | Admitting: Ophthalmology

## 2015-04-24 DIAGNOSIS — I10 Essential (primary) hypertension: Secondary | ICD-10-CM | POA: Diagnosis not present

## 2015-04-24 DIAGNOSIS — Z01810 Encounter for preprocedural cardiovascular examination: Secondary | ICD-10-CM | POA: Insufficient documentation

## 2015-04-24 DIAGNOSIS — H2512 Age-related nuclear cataract, left eye: Secondary | ICD-10-CM | POA: Diagnosis not present

## 2015-04-26 DIAGNOSIS — Z853 Personal history of malignant neoplasm of breast: Secondary | ICD-10-CM | POA: Diagnosis not present

## 2015-04-26 DIAGNOSIS — B0229 Other postherpetic nervous system involvement: Secondary | ICD-10-CM | POA: Diagnosis not present

## 2015-04-26 DIAGNOSIS — M1990 Unspecified osteoarthritis, unspecified site: Secondary | ICD-10-CM | POA: Diagnosis not present

## 2015-04-26 DIAGNOSIS — Z7982 Long term (current) use of aspirin: Secondary | ICD-10-CM | POA: Diagnosis not present

## 2015-04-26 DIAGNOSIS — Z79899 Other long term (current) drug therapy: Secondary | ICD-10-CM | POA: Diagnosis not present

## 2015-04-26 DIAGNOSIS — H2511 Age-related nuclear cataract, right eye: Secondary | ICD-10-CM | POA: Diagnosis not present

## 2015-04-26 DIAGNOSIS — I10 Essential (primary) hypertension: Secondary | ICD-10-CM | POA: Diagnosis not present

## 2015-04-26 DIAGNOSIS — K219 Gastro-esophageal reflux disease without esophagitis: Secondary | ICD-10-CM | POA: Diagnosis not present

## 2015-04-26 DIAGNOSIS — E119 Type 2 diabetes mellitus without complications: Secondary | ICD-10-CM | POA: Diagnosis not present

## 2015-04-26 DIAGNOSIS — G709 Myoneural disorder, unspecified: Secondary | ICD-10-CM | POA: Diagnosis not present

## 2015-04-26 DIAGNOSIS — Z85038 Personal history of other malignant neoplasm of large intestine: Secondary | ICD-10-CM | POA: Diagnosis not present

## 2015-04-27 DIAGNOSIS — B0229 Other postherpetic nervous system involvement: Secondary | ICD-10-CM | POA: Diagnosis not present

## 2015-04-27 DIAGNOSIS — Z85038 Personal history of other malignant neoplasm of large intestine: Secondary | ICD-10-CM | POA: Diagnosis not present

## 2015-04-27 DIAGNOSIS — H2511 Age-related nuclear cataract, right eye: Secondary | ICD-10-CM | POA: Diagnosis not present

## 2015-04-27 DIAGNOSIS — G709 Myoneural disorder, unspecified: Secondary | ICD-10-CM | POA: Diagnosis not present

## 2015-04-27 DIAGNOSIS — E119 Type 2 diabetes mellitus without complications: Secondary | ICD-10-CM | POA: Diagnosis not present

## 2015-04-27 DIAGNOSIS — I10 Essential (primary) hypertension: Secondary | ICD-10-CM | POA: Diagnosis not present

## 2015-04-29 NOTE — H&P (Signed)
  History and physical was faxed and scanned in.   

## 2015-04-30 ENCOUNTER — Encounter
Admission: RE | Disposition: A | Discharge status: Home / Self Care | Payer: Self-pay | Source: Ambulatory Visit | Attending: Ophthalmology

## 2015-04-30 ENCOUNTER — Encounter: Payer: Self-pay | Admitting: *Deleted

## 2015-04-30 ENCOUNTER — Ambulatory Visit: Payer: Medicare Other | Admitting: *Deleted

## 2015-04-30 ENCOUNTER — Ambulatory Visit
Admission: RE | Admit: 2015-04-30 | Discharge: 2015-04-30 | Disposition: A | Discharge status: Home / Self Care | Payer: Medicare Other | Source: Ambulatory Visit | Attending: Ophthalmology | Admitting: Ophthalmology

## 2015-04-30 DIAGNOSIS — M1990 Unspecified osteoarthritis, unspecified site: Secondary | ICD-10-CM | POA: Insufficient documentation

## 2015-04-30 DIAGNOSIS — Z79899 Other long term (current) drug therapy: Secondary | ICD-10-CM | POA: Insufficient documentation

## 2015-04-30 DIAGNOSIS — H2512 Age-related nuclear cataract, left eye: Secondary | ICD-10-CM | POA: Diagnosis not present

## 2015-04-30 DIAGNOSIS — B0229 Other postherpetic nervous system involvement: Secondary | ICD-10-CM | POA: Insufficient documentation

## 2015-04-30 DIAGNOSIS — E119 Type 2 diabetes mellitus without complications: Secondary | ICD-10-CM | POA: Insufficient documentation

## 2015-04-30 DIAGNOSIS — Z85038 Personal history of other malignant neoplasm of large intestine: Secondary | ICD-10-CM | POA: Diagnosis not present

## 2015-04-30 DIAGNOSIS — H2511 Age-related nuclear cataract, right eye: Secondary | ICD-10-CM | POA: Insufficient documentation

## 2015-04-30 DIAGNOSIS — Z7982 Long term (current) use of aspirin: Secondary | ICD-10-CM | POA: Insufficient documentation

## 2015-04-30 DIAGNOSIS — I10 Essential (primary) hypertension: Secondary | ICD-10-CM | POA: Insufficient documentation

## 2015-04-30 DIAGNOSIS — G709 Myoneural disorder, unspecified: Secondary | ICD-10-CM | POA: Diagnosis not present

## 2015-04-30 DIAGNOSIS — Z853 Personal history of malignant neoplasm of breast: Secondary | ICD-10-CM | POA: Insufficient documentation

## 2015-04-30 DIAGNOSIS — K219 Gastro-esophageal reflux disease without esophagitis: Secondary | ICD-10-CM | POA: Insufficient documentation

## 2015-04-30 HISTORY — PX: CATARACT EXTRACTION W/PHACO: SHX586

## 2015-04-30 LAB — GLUCOSE, CAPILLARY: Glucose-Capillary: 120 mg/dL — ABNORMAL HIGH (ref 65–99)

## 2015-04-30 SURGERY — PHACOEMULSIFICATION, CATARACT, WITH IOL INSERTION
Anesthesia: Monitor Anesthesia Care | Site: Eye | Laterality: Left | Wound class: Clean

## 2015-04-30 MED ORDER — ALFENTANIL 500 MCG/ML IJ INJ
INJECTION | INTRAMUSCULAR | Status: DC | PRN
  Administered 2015-04-30: 500 ug via INTRAVENOUS

## 2015-04-30 MED ORDER — EPINEPHRINE HCL 1 MG/ML IJ SOLN
INTRAOCULAR | Status: DC | PRN
  Administered 2015-04-30: 200 mL

## 2015-04-30 MED ORDER — CYCLOPENTOLATE HCL 2 % OP SOLN
1.0000 [drp] | OPHTHALMIC | Status: DC | PRN
  Administered 2015-04-30 (×4): 1 [drp] via OPHTHALMIC

## 2015-04-30 MED ORDER — CYCLOPENTOLATE HCL 2 % OP SOLN
OPHTHALMIC | Status: AC
  Administered 2015-04-30: 1 [drp] via OPHTHALMIC
  Filled 2015-04-30: qty 2

## 2015-04-30 MED ORDER — PHENYLEPHRINE HCL 10 % OP SOLN
OPHTHALMIC | Status: AC
  Administered 2015-04-30: 1 [drp] via OPHTHALMIC
  Filled 2015-04-30: qty 5

## 2015-04-30 MED ORDER — CARBACHOL 0.01 % IO SOLN
INTRAOCULAR | Status: DC | PRN
  Administered 2015-04-30: 0.5 mL via INTRAOCULAR

## 2015-04-30 MED ORDER — TETRACAINE HCL 0.5 % OP SOLN
OPHTHALMIC | Status: AC
  Filled 2015-04-30: qty 2

## 2015-04-30 MED ORDER — HYALURONIDASE HUMAN 150 UNIT/ML IJ SOLN
INTRAMUSCULAR | Status: AC
  Filled 2015-04-30: qty 1

## 2015-04-30 MED ORDER — NA CHONDROIT SULF-NA HYALURON 40-17 MG/ML IO SOLN
INTRAOCULAR | Status: AC
  Filled 2015-04-30: qty 1

## 2015-04-30 MED ORDER — LIDOCAINE HCL (PF) 4 % IJ SOLN
INTRAMUSCULAR | Status: AC
  Filled 2015-04-30: qty 10

## 2015-04-30 MED ORDER — EPINEPHRINE HCL 1 MG/ML IJ SOLN
INTRAMUSCULAR | Status: AC
  Filled 2015-04-30: qty 2

## 2015-04-30 MED ORDER — TETRACAINE HCL 0.5 % OP SOLN
OPHTHALMIC | Status: DC | PRN
Start: 2015-04-30 — End: 2015-04-30
  Administered 2015-04-30: 1 [drp] via OPHTHALMIC

## 2015-04-30 MED ORDER — NA CHONDROIT SULF-NA HYALURON 40-17 MG/ML IO SOLN
INTRAOCULAR | Status: DC | PRN
  Administered 2015-04-30: 1 mL via INTRAOCULAR

## 2015-04-30 MED ORDER — MIDAZOLAM HCL 2 MG/2ML IJ SOLN
INTRAMUSCULAR | Status: DC | PRN
  Administered 2015-04-30: 1 mg via INTRAVENOUS

## 2015-04-30 MED ORDER — CEFUROXIME OPHTHALMIC INJECTION 1 MG/0.1 ML
INJECTION | OPHTHALMIC | Status: AC
  Filled 2015-04-30: qty 0.1

## 2015-04-30 MED ORDER — BUPIVACAINE HCL (PF) 0.75 % IJ SOLN
INTRAMUSCULAR | Status: AC
  Filled 2015-04-30: qty 10

## 2015-04-30 MED ORDER — CEFUROXIME OPHTHALMIC INJECTION 1 MG/0.1 ML
INJECTION | OPHTHALMIC | Status: DC | PRN
  Administered 2015-04-30: 0.1 mL via INTRACAMERAL

## 2015-04-30 MED ORDER — MOXIFLOXACIN HCL 0.5 % OP SOLN
1.0000 [drp] | OPHTHALMIC | Status: DC | PRN
  Administered 2015-04-30 (×3): 1 [drp] via OPHTHALMIC

## 2015-04-30 MED ORDER — MOXIFLOXACIN HCL 0.5 % OP SOLN
OPHTHALMIC | Status: AC
  Administered 2015-04-30: 1 [drp] via OPHTHALMIC
  Filled 2015-04-30: qty 3

## 2015-04-30 MED ORDER — SODIUM CHLORIDE 0.9 % IV SOLN
INTRAVENOUS | Status: DC
  Administered 2015-04-30 (×2): via INTRAVENOUS

## 2015-04-30 MED ORDER — MOXIFLOXACIN HCL 0.5 % OP SOLN
OPHTHALMIC | Status: DC | PRN
  Administered 2015-04-30: 1 [drp] via OPHTHALMIC

## 2015-04-30 MED ORDER — PHENYLEPHRINE HCL 10 % OP SOLN
1.0000 [drp] | OPHTHALMIC | Status: DC | PRN
  Administered 2015-04-30 (×4): 1 [drp] via OPHTHALMIC

## 2015-04-30 MED ORDER — LIDOCAINE HCL (PF) 1 % IJ SOLN
INTRAOCULAR | Status: DC | PRN
  Administered 2015-04-30: .5 mL via OPHTHALMIC

## 2015-04-30 SURGICAL SUPPLY — 26 items
CORD BIP STRL DISP 12FT (MISCELLANEOUS) ×2 IMPLANT
DRAPE XRAY CASSETTE 23X24 (DRAPES) ×2 IMPLANT
ERASER HMR WETFIELD 18G (MISCELLANEOUS) ×2 IMPLANT
GLOVE BIO SURGEON STRL SZ8 (GLOVE) ×2 IMPLANT
GLOVE SURG LX 6.5 MICRO (GLOVE) ×1
GLOVE SURG LX 8.0 MICRO (GLOVE) ×1
GLOVE SURG LX STRL 6.5 MICRO (GLOVE) ×1 IMPLANT
GLOVE SURG LX STRL 8.0 MICRO (GLOVE) ×1 IMPLANT
GOWN STRL REUS W/ TWL LRG LVL3 (GOWN DISPOSABLE) ×1 IMPLANT
GOWN STRL REUS W/ TWL XL LVL3 (GOWN DISPOSABLE) ×1 IMPLANT
GOWN STRL REUS W/TWL LRG LVL3 (GOWN DISPOSABLE) ×1
GOWN STRL REUS W/TWL XL LVL3 (GOWN DISPOSABLE) ×1
KIT IRRIGAT 0.9 MICROSMOOTH (MISCELLANEOUS) ×2 IMPLANT
LENS IOL ACRYSERT 24.5 (Intraocular Lens) ×2 IMPLANT
PACK CATARACT (MISCELLANEOUS) ×2 IMPLANT
PACK CATARACT DINGLEDEIN LX (MISCELLANEOUS) ×2 IMPLANT
PACK EYE AFTER SURG (MISCELLANEOUS) ×2 IMPLANT
SHLD EYE VISITEC  UNIV (MISCELLANEOUS) ×2 IMPLANT
SOL PREP PVP 2OZ (MISCELLANEOUS) ×2
SOLUTION PREP PVP 2OZ (MISCELLANEOUS) ×1 IMPLANT
SUT SILK 5-0 (SUTURE) ×2 IMPLANT
SYR 5ML LL (SYRINGE) ×2 IMPLANT
SYR TB 1ML 27GX1/2 LL (SYRINGE) ×2 IMPLANT
WATER STERILE IRR 1000ML POUR (IV SOLUTION) ×2 IMPLANT
WIPE NON LINTING 3.25X3.25 (MISCELLANEOUS) ×2 IMPLANT
sn6cws 24.5 acrysof lens ×2 IMPLANT

## 2015-04-30 NOTE — Anesthesia Procedure Notes (Signed)
Procedure Name: MAC Date/Time: 04/30/2015 11:10 AM Performed by: Allean Found Pre-anesthesia Checklist: Suction available, Emergency Drugs available, Patient identified, Patient being monitored and Timeout performed Oxygen Delivery Method: Nasal cannula

## 2015-04-30 NOTE — Transfer of Care (Signed)
Immediate Anesthesia Transfer of Care Note  Patient: Rachael Castro  Procedure(s) Performed: Procedure(s) with comments: CATARACT EXTRACTION PHACO AND INTRAOCULAR LENS PLACEMENT (IOC) (Left) - [place] 01:39 AP% 23.3 CDE 40.42 fluid pack lot # [id number]  Patient Location: PACU  Anesthesia Type:MAC  Level of Consciousness: awake  Airway & Oxygen Therapy: Patient Spontanous Breathing  Post-op Assessment: Report given to RN and Post -op Vital signs reviewed and stable  Post vital signs: Reviewed and stable  Last Vitals:  Filed Vitals:   04/30/15 1147  BP: 155/87  Pulse: 66  Temp: 36.3 C  Resp: 16    Complications: No apparent anesthesia complications

## 2015-04-30 NOTE — Discharge Instructions (Signed)
AMBULATORY SURGERY  °DISCHARGE INSTRUCTIONS ° ° °1) The drugs that you were given will stay in your system until tomorrow so for the next 24 hours you should not: ° °A) Drive an automobile °B) Make any legal decisions °C) Drink any alcoholic beverage ° ° °2) You may resume regular meals tomorrow.  Today it is better to start with liquids and gradually work up to solid foods. ° °You may eat anything you prefer, but it is better to start with liquids, then soup and crackers, and gradually work up to solid foods. ° ° °3) Please notify your doctor immediately if you have any unusual bleeding, trouble breathing, redness and pain at the surgery site, drainage, fever, or pain not relieved by medication. ° ° ° °4) Additional Instructions: ° ° ° °Cataract Surgery °Care After °Refer to this sheet in the next few weeks. These instructions provide you with information on caring for yourself after your procedure. Your caregiver may also give you more specific instructions. Your treatment has been planned according to current medical practices, but problems sometimes occur. Call your caregiver if you have any problems or questions after your procedure.  °HOME CARE INSTRUCTIONS  °· Avoid strenuous activities as directed by your caregiver. °· Ask your caregiver when you can resume driving. °· Use eyedrops or other medicines to help healing and control pressure inside your eye as directed by your caregiver. °· Only take over-the-counter or prescription medicines for pain, discomfort, or fever as directed by your caregiver. °· Do not to touch or rub your eyes. °· You may be instructed to use a protective shield during the first few days and nights after surgery. If not, wear sunglasses to protect your eyes. This is to protect the eye from pressure or from being accidentally bumped. °· Keep the area around your eye clean and dry. Avoid swimming or allowing water to hit you directly in the face while showering. Keep soap and shampoo  out of your eyes. °· Do not bend or lift heavy objects. Bending increases pressure in the eye. You can walk, climb stairs, and do light household chores. °· Do not put a contact lens into the eye that had surgery until your caregiver says it is okay to do so. °· Ask your doctor when you can return to work. This will depend on the kind of work that you do. If you work in a dusty environment, you may be advised to wear protective eyewear for a period of time. °· Ask your caregiver when it will be safe to engage in sexual activity. °· Continue with your regular eye exams as directed by your caregiver. °What to expect: °· It is normal to feel itching and mild discomfort for a few days after cataract surgery. Some fluid discharge is also common, and your eye may be sensitive to light and touch. °· After 1 to 2 days, even moderate discomfort should disappear. In most cases, healing will take about 6 weeks. °· If you received an intraocular lens (IOL), you may notice that colors are very bright or have a blue tinge. Also, if you have been in bright sunlight, everything may appear reddish for a few hours. If you see these color tinges, it is because your lens is clear and no longer cloudy. Within a few months after receiving an IOL, these extra colors should go away. When you have healed, you will probably need new glasses. °SEEK MEDICAL CARE IF:  °· You have increased bruising around your   eye. °· You have discomfort not helped by medicine. °SEEK IMMEDIATE MEDICAL CARE IF:  °· You have a  fever. °· You have a worsening or sudden vision loss. °· You have redness, swelling, or increasing pain in the eye. °· You have a thick discharge from the eye that had surgery. °MAKE SURE YOU: °· Understand these instructions. °· Will watch your condition. °· Will get help right away if you are not doing well or get worse. °Document Released: 04/25/2005 Document Revised: 12/29/2011 Document Reviewed: 05/30/2011 °ExitCare® Patient  Information ©2015 [employer]. This information is not intended to replace advice given to you by your health care provider. Make sure you discuss any questions you have with your health care provider. ° ° ° ° °Please contact your physician with any problems or Same Day Surgery at [phone], Monday through Friday 6 am to 4 pm, or [facility] Main number at [phone]. °

## 2015-04-30 NOTE — Anesthesia Preprocedure Evaluation (Signed)
Anesthesia Evaluation  Patient identified by MRN, date of birth, ID band Patient awake    Reviewed: Allergy & Precautions, NPO status , Patient's Chart, lab work & pertinent test results  Airway Mallampati: I  TM Distance: >3 FB Neck ROM: Limited    Dental  (+) Upper Dentures, Lower Dentures   Pulmonary    Pulmonary exam normal       Cardiovascular hypertension, Pt. on medications Normal cardiovascular exam    Neuro/Psych Post herpetic neuralgia.  Neuromuscular disease    GI/Hepatic GERD-  Medicated and Controlled,  Endo/Other  diabetes, Type 2  Renal/GU      Musculoskeletal  (+) Arthritis -, Osteoarthritis,    Abdominal (+)  Abdomen: soft.    Peds  Hematology   Anesthesia Other Findings   Reproductive/Obstetrics                             Anesthesia Physical Anesthesia Plan  ASA: III  Anesthesia Plan: MAC   Post-op Pain Management:    Induction: Intravenous  Airway Management Planned: Nasal Cannula  Additional Equipment:   Intra-op Plan:   Post-operative Plan:   Informed Consent: I have reviewed the patients History and Physical, chart, labs and discussed the procedure including the risks, benefits and alternatives for the proposed anesthesia with the patient or authorized representative who has indicated his/her understanding and acceptance.     Plan Discussed with: CRNA  Anesthesia Plan Comments:         Anesthesia Quick Evaluation

## 2015-04-30 NOTE — Op Note (Signed)
Date of Surgery: 04/30/2015 Date of Dictation: 04/30/2015 11:45 AM Pre-operative Diagnosis:  Nuclear Sclerotic Cataract right Eye Post-operative Diagnosis: same Procedure performed: Extra-capsular Cataract Extraction (ECCE) with placement of a posterior chamber intraocular lens (IOL) right Eye IOL:  Implant Name Type Inv. Item Serial No. Manufacturer Lot No. LRB No. Used  [id number] acrysof lens     [id number]     Left 1   Anesthesia: 2% Lidocaine and 4% Marcaine in a 50/50 mixture with 10 unites/ml of Hylenex given as a peribulbar Anesthesiologist: Anesthesiologist: Elyse Hsu, MD CRNA: Allean Found, CRNA Complications: none Estimated Blood Loss: less than 1 ml  Description of procedure:  The patient was given anesthesia and sedation via intravenous access. The patient was then prepped and draped in the usual fashion. A 25-gauge needle was bent for initiating the capsulorhexis. A 5-0 silk suture was placed through the conjunctiva superior and inferiorly to serve as bridle sutures. Hemostasis was obtained at the superior limbus using an eraser cautery. A partial thickness groove was made at the anterior surgical limbus with a 64 Beaver blade and this was dissected anteriorly with an [employer]. The anterior chamber was entered at 10 o'clock with a 1.0 mm paracentesis knife and through the lamellar dissection with a 2.6 mm Alcon keratome. DiscoVisc was injected to replace the aqueous and a continuous tear curvilinear capsulorhexis was performed using a bent 25-gauge needle.  Balance salt on a syringe was used to perform hydro-dissection and phacoemulsification was carried out using a divide and conquer technique. Procedure(s) with comments: CATARACT EXTRACTION PHACO AND INTRAOCULAR LENS PLACEMENT (IOC) (Left) - [place] 01:39 AP% 23.3 CDE 40.42 fluid pack lot # [id number]. Irrigation/aspiration was used to remove the residual cortex and the capsular bag was inflated with DiscoVisc. The  intraocular lens was inserted into the capsular bag using a pre-loaded Monarch Delivery System. Irrigation/aspiration was used to remove the residual DiscoVisc. The wound was inflated with balanced salt and checked for leaks. None were found. Miostat was injected via the paracentesis track and 0.1 ml of cefuroxime containing 1 mg of drug  was injected via the paracentesis track. The wound was checked for leaks again and none were found.   The bridal sutures were removed and two drops of Vigamox were placed on the eye. An eye shield was placed to protect the eye and the patient was discharged to the recovery area in good condition.   Dorlis Judice MD

## 2015-04-30 NOTE — Interval H&P Note (Signed)
History and Physical Interval Note:  04/30/2015 10:49 AM  Rachael Castro  has presented today for surgery, with the diagnosis of cataract  The various methods of treatment have been discussed with the patient and family. After consideration of risks, benefits and other options for treatment, the patient has consented to  Procedure(s): CATARACT EXTRACTION PHACO AND INTRAOCULAR LENS PLACEMENT ([place]) (Left) as a surgical intervention .  The patient's history has been reviewed, patient examined, no change in status, stable for surgery.  I have reviewed the patient's chart and labs.  Questions were answered to the patient's satisfaction.     [name]

## 2015-04-30 NOTE — Interval H&P Note (Signed)
History and Physical Interval Note:  04/30/2015 10:57 AM  Rachael Castro  has presented today for surgery, with the diagnosis of cataract  The various methods of treatment have been discussed with the patient and family. After consideration of risks, benefits and other options for treatment, the patient has consented to  Procedure(s): CATARACT EXTRACTION PHACO AND INTRAOCULAR LENS PLACEMENT ([place]) (Left) as a surgical intervention .  The patient's history has been reviewed, patient examined, no change in status, stable for surgery.  I have reviewed the patient's chart and labs.  Questions were answered to the patient's satisfaction.     [name]

## 2015-04-30 NOTE — Anesthesia Postprocedure Evaluation (Signed)
  Anesthesia Post-op Note  Patient: Rachael Castro  Procedure(s) Performed: Procedure(s) with comments: CATARACT EXTRACTION PHACO AND INTRAOCULAR LENS PLACEMENT ([facility]) (Left) - [place] 01:39 AP% 23.3 CDE 40.42 fluid pack lot # [id number]  Anesthesia type:MAC  Patient location: PACU  Post pain: Pain level controlled  Post assessment: Post-op Vital signs reviewed, Patient's Cardiovascular Status Stable, Respiratory Function Stable, Patent Airway and No signs of Nausea or vomiting  Post vital signs: Reviewed and stable  Last Vitals:  Filed Vitals:   04/30/15 1147  BP: 155/87  Pulse: 66  Temp: 36.3 C  Resp: 16    Level of consciousness: awake, alert  and patient cooperative  Complications: No apparent anesthesia complications

## 2015-04-30 NOTE — Anesthesia Postprocedure Evaluation (Signed)
  Anesthesia Post-op Note  Patient: Rachael Castro  Procedure(s) Performed: Procedure(s) with comments: CATARACT EXTRACTION PHACO AND INTRAOCULAR LENS PLACEMENT ([facility]) (Left) - [place] 01:39 AP% 23.3 CDE 40.42 fluid pack lot # [id number]  Anesthesia type:MAC  Patient location: PACU  Post pain: Pain level controlled  Post assessment: Post-op Vital signs reviewed, Patient's Cardiovascular Status Stable, Respiratory Function Stable, Patent Airway and No signs of Nausea or vomiting  Post vital signs: Reviewed and stable  Last Vitals:  Filed Vitals:   04/30/15 1147  BP: 155/87  Pulse: 66  Temp: 36.3 C  Resp: 16    Level of consciousness: awake, alert  and patient cooperative  Complications: No apparent anesthesia complications

## 2015-05-01 ENCOUNTER — Other Ambulatory Visit: Payer: Self-pay

## 2015-05-01 MED ORDER — ATORVASTATIN CALCIUM 40 MG PO TABS: 40.0000 mg | ORAL_TABLET | Freq: Every day | ORAL | Status: DC

## 2015-05-21 DIAGNOSIS — H04123 Dry eye syndrome of bilateral lacrimal glands: Secondary | ICD-10-CM | POA: Diagnosis not present

## 2015-05-24 ENCOUNTER — Other Ambulatory Visit: Payer: Self-pay

## 2015-05-24 DIAGNOSIS — N200 Calculus of kidney: Secondary | ICD-10-CM

## 2015-05-30 ENCOUNTER — Inpatient Hospital Stay: Payer: Medicare Other | Attending: Hematology and Oncology | Admitting: Hematology and Oncology

## 2015-05-30 VITALS — BP 133/60 | HR 55 | Temp 96.6°F | Resp 18 | Ht 64.0 in | Wt 133.0 lb

## 2015-05-30 DIAGNOSIS — Z79899 Other long term (current) drug therapy: Secondary | ICD-10-CM | POA: Insufficient documentation

## 2015-05-30 DIAGNOSIS — C49A0 Gastrointestinal stromal tumor, unspecified site: Secondary | ICD-10-CM

## 2015-05-30 DIAGNOSIS — K219 Gastro-esophageal reflux disease without esophagitis: Secondary | ICD-10-CM | POA: Insufficient documentation

## 2015-05-30 DIAGNOSIS — Z9221 Personal history of antineoplastic chemotherapy: Secondary | ICD-10-CM | POA: Insufficient documentation

## 2015-05-30 DIAGNOSIS — Z8500 Personal history of malignant neoplasm of unspecified digestive organ: Secondary | ICD-10-CM | POA: Insufficient documentation

## 2015-05-30 DIAGNOSIS — E119 Type 2 diabetes mellitus without complications: Secondary | ICD-10-CM | POA: Insufficient documentation

## 2015-05-30 DIAGNOSIS — Z7982 Long term (current) use of aspirin: Secondary | ICD-10-CM | POA: Insufficient documentation

## 2015-05-30 DIAGNOSIS — Z170 Estrogen receptor positive status [ER+]: Secondary | ICD-10-CM | POA: Insufficient documentation

## 2015-05-30 DIAGNOSIS — Z86010 Personal history of colonic polyps: Secondary | ICD-10-CM | POA: Diagnosis not present

## 2015-05-30 DIAGNOSIS — Z853 Personal history of malignant neoplasm of breast: Secondary | ICD-10-CM | POA: Diagnosis not present

## 2015-05-30 DIAGNOSIS — Z8509 Personal history of malignant neoplasm of other digestive organs: Secondary | ICD-10-CM | POA: Insufficient documentation

## 2015-05-30 DIAGNOSIS — I10 Essential (primary) hypertension: Secondary | ICD-10-CM | POA: Diagnosis not present

## 2015-05-30 LAB — COMPREHENSIVE METABOLIC PANEL
ALT: 14 U/L (ref 14–54)
AST: 21 U/L (ref 15–41)
Albumin: 4.3 g/dL (ref 3.5–5.0)
Alkaline Phosphatase: 73 U/L (ref 38–126)
Anion gap: 5 (ref 5–15)
BUN: 19 mg/dL (ref 6–20)
CO2: 32 mmol/L (ref 22–32)
Calcium: 9.6 mg/dL (ref 8.9–10.3)
Chloride: 103 mmol/L (ref 101–111)
Creatinine, Ser: 0.67 mg/dL (ref 0.44–1.00)
GFR calc Af Amer: >60 mL/min (ref >60)
GFR calc non Af Amer: >60 mL/min (ref >60)
Glucose, Bld: 150 mg/dL — ABNORMAL HIGH (ref 65–99)
Potassium: 4.1 mmol/L (ref 3.5–5.1)
Sodium: 140 mmol/L (ref 135–145)
Total Bilirubin: 0.6 mg/dL (ref 0.3–1.2)
Total Protein: 7.6 g/dL (ref 6.5–8.1)

## 2015-05-30 LAB — CBC WITH DIFFERENTIAL/PLATELET
Basophils Absolute: 0 10*3/uL (ref 0–0.1)
Basophils Relative: 0 %
Eosinophils Absolute: 0.1 10*3/uL (ref 0–0.7)
Eosinophils Relative: 2 %
HCT: 37.3 % (ref 35.0–47.0)
Hemoglobin: 12.5 g/dL (ref 12.0–16.0)
Lymphocytes Relative: 30 %
Lymphs Abs: 2.3 10*3/uL (ref 1.0–3.6)
MCH: 31.6 pg (ref 26.0–34.0)
MCHC: 33.5 g/dL (ref 32.0–36.0)
MCV: 94.4 fL (ref 80.0–100.0)
Monocytes Absolute: 0.4 10*3/uL (ref 0.2–0.9)
Monocytes Relative: 6 %
Neutro Abs: 4.7 10*3/uL (ref 1.4–6.5)
Neutrophils Relative %: 62 %
Platelets: 197 10*3/uL (ref 150–440)
RBC: 3.95 MIL/uL (ref 3.80–5.20)
RDW: 13.9 % (ref 11.5–14.5)
WBC: 7.6 10*3/uL (ref 3.6–11.0)

## 2015-05-30 NOTE — Progress Notes (Signed)
Pt reports only changes since last visit being Cataract surgery to left eye.

## 2015-05-31 LAB — CANCER ANTIGEN 27.29: CA 27.29: 46.6 U/mL — ABNORMAL HIGH (ref 0.0–38.6)

## 2015-06-18 ENCOUNTER — Ambulatory Visit (INDEPENDENT_AMBULATORY_CARE_PROVIDER_SITE_OTHER): Payer: Medicare Other | Admitting: Family Medicine

## 2015-06-18 ENCOUNTER — Encounter: Payer: Self-pay | Admitting: Family Medicine

## 2015-06-18 VITALS — BP 122/64 | HR 66 | Temp 97.8°F | Resp 16 | Ht 64.0 in | Wt 132.6 lb

## 2015-06-18 DIAGNOSIS — I10 Essential (primary) hypertension: Secondary | ICD-10-CM

## 2015-06-18 DIAGNOSIS — E119 Type 2 diabetes mellitus without complications: Secondary | ICD-10-CM | POA: Diagnosis not present

## 2015-06-18 DIAGNOSIS — E785 Hyperlipidemia, unspecified: Secondary | ICD-10-CM | POA: Diagnosis not present

## 2015-06-18 DIAGNOSIS — Z8509 Personal history of malignant neoplasm of other digestive organs: Secondary | ICD-10-CM | POA: Diagnosis not present

## 2015-06-18 DIAGNOSIS — Z23 Encounter for immunization: Secondary | ICD-10-CM | POA: Diagnosis not present

## 2015-06-18 LAB — POCT UA - MICROALBUMIN: Microalbumin Ur, POC: 20 mg/L

## 2015-06-18 LAB — GLUCOSE, POCT (MANUAL RESULT ENTRY): POC GLUCOSE: 118 mg/dL — AB (ref 70–99)

## 2015-06-18 LAB — POCT GLYCOSYLATED HEMOGLOBIN (HGB A1C): HEMOGLOBIN A1C: 6.7

## 2015-06-19 LAB — TSH: TSH: 1.99 u[IU]/mL (ref 0.450–4.500)

## 2015-06-19 LAB — COMPREHENSIVE METABOLIC PANEL
A/G RATIO: 1.9 (ref 1.1–2.5)
ALBUMIN: 4.5 g/dL (ref 3.5–4.7)
ALT: 11 IU/L (ref 0–32)
AST: 16 IU/L (ref 0–40)
Alkaline Phosphatase: 87 IU/L (ref 39–117)
BUN/Creatinine Ratio: 24 (ref 11–26)
BUN: 16 mg/dL (ref 8–27)
Bilirubin Total: 0.3 mg/dL (ref 0.0–1.2)
CALCIUM: 9.6 mg/dL (ref 8.7–10.3)
CO2: 23 mmol/L (ref 18–29)
Chloride: 103 mmol/L (ref 97–108)
Creatinine, Ser: 0.68 mg/dL (ref 0.57–1.00)
GFR, EST AFRICAN AMERICAN: 95 mL/min/{1.73_m2} (ref >59)
GFR, EST NON AFRICAN AMERICAN: 82 mL/min/{1.73_m2} (ref >59)
Globulin, Total: 2.4 g/dL (ref 1.5–4.5)
Glucose: 120 mg/dL — ABNORMAL HIGH (ref 65–99)
POTASSIUM: 4.4 mmol/L (ref 3.5–5.2)
Sodium: 144 mmol/L (ref 134–144)
TOTAL PROTEIN: 6.9 g/dL (ref 6.0–8.5)

## 2015-06-19 LAB — LIPID PANEL
CHOL/HDL RATIO: 3.9 ratio (ref 0.0–4.4)
Cholesterol, Total: 224 mg/dL — ABNORMAL HIGH (ref 100–199)
HDL: 57 mg/dL (ref >39)
LDL Calculated: 136 mg/dL — ABNORMAL HIGH (ref 0–99)
Triglycerides: 157 mg/dL — ABNORMAL HIGH (ref 0–149)
VLDL CHOLESTEROL CAL: 31 mg/dL (ref 5–40)

## 2015-06-19 NOTE — Progress Notes (Signed)
Name: Rachael Castro   MRN: [id number]    DOB: [birth year]   Date:06/19/2015       Progress Note  Subjective  Chief Complaint  Chief Complaint  Patient presents with  . Diabetes    pt here for 3 month follow up  . Hyperlipidemia  . Hypertension    HPI  Diabetes  Patient presents for follow-up of diabetes which is present for over 5  years. Is currently on a regimen of diet and exercise alone . Patient states she is compliant with their diet and exercise. There's been no hypoglycemic episodes and there is no polyuria polydipsia polyphagia. His average fasting glucoses been in the low around the low 100s with a high around the same . There is no end organ disease.  Last diabetic eye exam was earlier this year.   Last visit with dietitian was last year. Last microalbumin was obtained today and is 20 which is normal .   Hypertension   Patient presents for follow-up of hypertension. It has been present for over more than 5 years.  Patient states that there is compliance with medical regimen which consists of losartan 50 mg once daily . There is no end organ disease. Cardiac risk factors include hypertension hyperlipidemia and diabetes.  Exercise regimen consist of losartan 50 mg once daily .  Diet consist of sodium restriction  Hyperlipidemia  Patient has a history of hyperlipidemia for over 5 years.  Current medical regimen consist of atorvastatin 40 mg daily at bedtime .  Compliance is good .  Diet and exercise are currently followed consistently .  Risk factors for cardiovascular disease include hyperlipidemia , sedentary lifestyle, hypertension, diabetes mellitus diet-controlled .   There have been no side effects from the medication.      Past Medical History  Diagnosis Date  . Hypertension   . Diabetes mellitus   . GERD (gastroesophageal reflux disease)   . Neuromuscular disorder     hx shingles  . Hypercholesteremia   . Personal history of colonic polyps   . Thyroid disease    . b     breast cancer  . Malignant neoplasm of other specified sites of stomach 2013    GIST    Social History  Substance Use Topics  . Smoking status: Never Smoker   . Smokeless tobacco: Never Used  . Alcohol Use: No     Current outpatient prescriptions:  .  aspirin 81 MG tablet, Take 81 mg by mouth daily., Disp: , Rfl:  .  atorvastatin (LIPITOR) 40 MG tablet, Take 1 tablet (40 mg total) by mouth daily., Disp: 30 tablet, Rfl: 2 .  calcium citrate-vitamin D 200-200 MG-UNIT TABS, Take 1 tablet by mouth daily., Disp: , Rfl:  .  DULoxetine (CYMBALTA) 30 MG capsule, Limit one tab by mouth per day or twice a day if tolerated, Disp: 180 capsule, Rfl: 0 .  DUREZOL 0.05 % EMUL, INSTILL 1 DROP 2 TIMES A DAY INTO INTO LEFT EYE AFTER PATCH IS REMOVED, Disp: , Rfl: 0 .  Gabapentin, Once-Daily, 300 MG TABS, Limit one tab by mouth per day or twice a day if tolerated, Disp: 180 tablet, Rfl: 0 .  ILEVRO 0.3 % ophthalmic suspension, 1 DROP STARTING 2 DAYS PRIOR TO SURGERY IN OP EYE AND 1 DROP AM OF SURGERY/CONTINUE W/1DROP TILL DON, Disp: , Rfl: 0 .  losartan (COZAAR) 50 MG tablet, Take 50 mg by mouth every other day., Disp: , Rfl:  .  Multiple Vitamin (  MULTIVITAMIN) tablet, Take 1 tablet by mouth daily., Disp: , Rfl:  .  omeprazole (PRILOSEC) 40 MG capsule, Take 40 mg by mouth daily., Disp: , Rfl:  .  thiamine (VITAMIN B-1) 100 MG tablet, Take 100 mg by mouth daily., Disp: , Rfl:  .  sitaGLIPtin (JANUVIA) 100 MG tablet, Take 100 mg by mouth daily., Disp: , Rfl:   No Known Allergies  Review of Systems  Constitutional: Negative for fever, chills and weight loss.  HENT: Negative for congestion, hearing loss, sore throat and tinnitus.   Eyes: Negative for blurred vision, double vision and redness.  Respiratory: Negative for cough, hemoptysis and shortness of breath.   Cardiovascular: Negative for chest pain, palpitations, orthopnea, claudication and leg swelling.  Gastrointestinal: Negative for  heartburn, nausea, vomiting, diarrhea, constipation and blood in stool.  Genitourinary: Negative for dysuria, urgency, frequency and hematuria.  Musculoskeletal: Negative for myalgias, back pain, joint pain, falls and neck pain.  Skin: Negative for itching.  Neurological: Negative for dizziness, tingling, tremors, focal weakness, seizures, loss of consciousness, weakness and headaches.  Endo/Heme/Allergies: Does not bruise/bleed easily.  Psychiatric/Behavioral: Negative for depression and substance abuse. The patient is not nervous/anxious and does not have insomnia.      Objective  Filed Vitals:   06/18/15 1039  BP: 122/64  Pulse: 66  Temp: 97.8 F (36.6 C)  Resp: 16  Height: 5\' 4"  (1.626 m)  Weight: 132 lb 9 oz (60.13 kg)  SpO2: 92%     Physical Exam  Constitutional: She is oriented to person, place, and time and well-developed, well-nourished, and in no distress.  HENT:  Head: Normocephalic.  Eyes: EOM are normal. Pupils are equal, round, and reactive to light.  Neck: Normal range of motion. No thyromegaly present.  Cardiovascular: Normal rate, regular rhythm and normal heart sounds.   No murmur heard. Pulmonary/Chest: Effort normal and breath sounds normal.  Abdominal: Soft. Bowel sounds are normal.  Musculoskeletal: Normal range of motion. She exhibits no edema.  Neurological: She is alert and oriented to person, place, and time. No cranial nerve deficit. Gait normal.  Skin: Skin is warm and dry. No rash noted.  Psychiatric: Memory and affect normal.      Assessment & Plan  1. Type 2 diabetes mellitus without complication Well-controlled - POCT HgB A1C - POCT Glucose (CBG) - POCT UA - Microalbumin - Comprehensive metabolic panel - Lipid panel - TSH  2. Encounter for immunization Given today  3. Essential hypertension Well-controlled - Comprehensive metabolic panel - Lipid panel - TSH  4. H/O malignant gastrointestinal stromal tumor (GIST) Currently  in remission and followed by general surgeon - Comprehensive metabolic panel - Lipid panel hemoglobin earlier this year - TSH  5. Hyperlipidemia Lab today - Lipid panel

## 2015-06-20 ENCOUNTER — Telehealth: Payer: Self-pay | Admitting: Emergency Medicine

## 2015-06-20 NOTE — Telephone Encounter (Signed)
Patient notified

## 2015-07-03 ENCOUNTER — Telehealth: Payer: Self-pay | Admitting: Hematology and Oncology

## 2015-07-03 ENCOUNTER — Other Ambulatory Visit: Payer: Self-pay | Admitting: Hematology and Oncology

## 2015-07-03 DIAGNOSIS — Z853 Personal history of malignant neoplasm of breast: Secondary | ICD-10-CM

## 2015-07-03 NOTE — Telephone Encounter (Signed)
Patient said Dr. Mike Gip left voice mail for her and she could not understand it clearly. She thinks it said Dr. Loletha Grayer wants her to come get labs drawn sometime this month but she wanted to confirm. I do not see any orders or scheduling messages about it. Do I need to set up a lab visit for her? Thanks.

## 2015-07-04 ENCOUNTER — Other Ambulatory Visit: Payer: Self-pay

## 2015-07-04 DIAGNOSIS — Z85028 Personal history of other malignant neoplasm of stomach: Secondary | ICD-10-CM

## 2015-07-04 DIAGNOSIS — Z853 Personal history of malignant neoplasm of breast: Secondary | ICD-10-CM

## 2015-07-09 ENCOUNTER — Inpatient Hospital Stay: Payer: Medicare Other | Attending: Hematology and Oncology

## 2015-07-09 DIAGNOSIS — D481 Neoplasm of uncertain behavior of connective and other soft tissue: Secondary | ICD-10-CM | POA: Insufficient documentation

## 2015-07-09 DIAGNOSIS — Z85028 Personal history of other malignant neoplasm of stomach: Secondary | ICD-10-CM

## 2015-07-10 LAB — CANCER ANTIGEN 27.29: CA 27.29: 41.9 U/mL — ABNORMAL HIGH (ref 0.0–38.6)

## 2015-07-14 ENCOUNTER — Other Ambulatory Visit: Payer: Self-pay | Admitting: Pain Medicine

## 2015-07-16 ENCOUNTER — Encounter: Payer: Self-pay | Admitting: Pain Medicine

## 2015-07-16 ENCOUNTER — Ambulatory Visit: Payer: Medicare Other | Attending: Pain Medicine | Admitting: Pain Medicine

## 2015-07-16 VITALS — BP 139/58 | HR 67 | Temp 97.8°F | Resp 14 | Ht 64.0 in | Wt 133.0 lb

## 2015-07-16 DIAGNOSIS — M79601 Pain in right arm: Secondary | ICD-10-CM | POA: Diagnosis present

## 2015-07-16 DIAGNOSIS — M47817 Spondylosis without myelopathy or radiculopathy, lumbosacral region: Secondary | ICD-10-CM | POA: Diagnosis not present

## 2015-07-16 DIAGNOSIS — M545 Low back pain: Secondary | ICD-10-CM | POA: Diagnosis not present

## 2015-07-16 DIAGNOSIS — M4802 Spinal stenosis, cervical region: Secondary | ICD-10-CM | POA: Diagnosis not present

## 2015-07-16 DIAGNOSIS — B0223 Postherpetic polyneuropathy: Secondary | ICD-10-CM | POA: Diagnosis not present

## 2015-07-16 DIAGNOSIS — M5136 Other intervertebral disc degeneration, lumbar region: Secondary | ICD-10-CM

## 2015-07-16 DIAGNOSIS — M47812 Spondylosis without myelopathy or radiculopathy, cervical region: Secondary | ICD-10-CM | POA: Diagnosis not present

## 2015-07-16 DIAGNOSIS — M542 Cervicalgia: Secondary | ICD-10-CM | POA: Diagnosis present

## 2015-07-16 DIAGNOSIS — M5416 Radiculopathy, lumbar region: Secondary | ICD-10-CM | POA: Diagnosis not present

## 2015-07-16 DIAGNOSIS — B0229 Other postherpetic nervous system involvement: Secondary | ICD-10-CM

## 2015-07-16 DIAGNOSIS — M79609 Pain in unspecified limb: Secondary | ICD-10-CM | POA: Diagnosis not present

## 2015-07-16 DIAGNOSIS — M5030 Other cervical disc degeneration, unspecified cervical region: Secondary | ICD-10-CM

## 2015-07-16 DIAGNOSIS — M79602 Pain in left arm: Secondary | ICD-10-CM | POA: Diagnosis present

## 2015-07-16 MED ORDER — GABAPENTIN (ONCE-DAILY) 300 MG PO TABS: ORAL_TABLET | ORAL | Status: DC

## 2015-07-16 MED ORDER — DULOXETINE HCL 30 MG PO CPEP: ORAL_CAPSULE | ORAL | Status: DC

## 2015-07-16 NOTE — Progress Notes (Signed)
   Subjective:    Patient ID: Rachael Castro, female    DOB: April 27, 1931, 81 y.o.   MRN: [id number]  HPI    The patient is a 81 year old female returns to pain management for further evaluation and treatment of pain involving the neck upper extremity region and headaches as well as the lower back and lower extremity regions. Patient states that her pain is well controlled at the present time. Patient continues the use of Cymbalta and Neurontin. We will avoid interventional treatment and continue present medications as prescribed. The patient states she is doing very well at this time and is in agreement with suggested treatment plan.   Review of Systems     Objective:   Physical Exam There was tenderness over the splenius capitis and occipitalis regions of mild degree. There was mild tennis over the cervical facet cervical paraspinal musculature region as well as the thoracic facet thoracic paraspinal musculature region. There was unremarkable Spurling's maneuver. There was minimal tinnitus of the acromioclavicular and glenohumeral joint regions. Patient appeared to be with slightly decreased grip strength. Tinel and Phalen's maneuver were without increase of pain of significant degree. There was tends to palpation over the region of the lumbar paraspinal muscular region lumbar facet region of moderate degree.Lateral bending and rotation extension and palpation of the lumbar facets reproduce moderate discomfort. There was tends to palpation over the PSIS and PII S region as well as the gluteal and piriformis musculature regions. Straight leg raising was tolerated to approximately 20 without increase of pain with dorsiflexion noted. There was negative clonus negative Homans. There was slightly increased sensitivity to touch of the lateral aspect of the right lower extremity distal to the knee. EHL strength was slightly decreased. There was negative clonus negative Homans. No new lesions of the skin  were noted. Abdomen soft nontender and no costovertebral angle tenderness was noted.       Assessment & Plan:    Postherpetic neuralgia of the right lower extremity Lumbar and lower extremity region on the right  Degenerative disc disease lumbar spine  Degenerative disc disease cervical spine C3-4 degenerative changes with foraminal stenosis on the left C5-6 stenosis with right C7 stenosis with multilevel degenerative changes noted throughout the cervical spine     PLAN   Continue present medication Cymbalta and Neurontin  F/U PCP Dr. Lucita Lora for evaliation of  BP and general medical  condition  F/U surgical evaluation.. We will avoid surgical evaluation  F/U neurological evaluation. May consider pending follow-up evaluations  May consider radiofrequency rhizolysis or intraspinal procedures pending response to present treatment and F/U evaluation   Patient to call [facility] should patient have concerns prior to scheduled return appointment.

## 2015-07-16 NOTE — Progress Notes (Signed)
Safety precautions to be maintained throughout the outpatient stay will include: orient to surroundings, keep bed in low position, maintain call bell within reach at all times, provide assistance with transfer out of bed and ambulation.  Discharged ambulatory at 2:00 pm

## 2015-07-16 NOTE — Patient Instructions (Addendum)
PLAN   Continue present medication Cymbalta and Neurontin  F/U PCP Dr. Lucita Lora for evaliation of  BP and general medical  condition  F/U surgical evaluation. May consider pending follow-up evaluations  F/U neurological evaluation. May consider pending follow-up evaluations  May consider radiofrequency rhizolysis or intraspinal procedures pending response to present treatment and F/U evaluation   Patient to call [facility] should patient have concerns prior to scheduled return appointment.  A prescription for CYMBALTA, GABAPENTIN was sent to your pharmacy and should be available for pickup today.

## 2015-07-17 ENCOUNTER — Ambulatory Visit: Payer: Medicare Other | Admitting: Pain Medicine

## 2015-07-19 ENCOUNTER — Ambulatory Visit
Admission: RE | Admit: 2015-07-19 | Discharge: 2015-07-19 | Disposition: A | Discharge status: Home / Self Care | Payer: Medicare Other | Source: Ambulatory Visit | Attending: Hematology and Oncology | Admitting: Hematology and Oncology

## 2015-07-19 ENCOUNTER — Other Ambulatory Visit: Payer: Self-pay | Admitting: Hematology and Oncology

## 2015-07-19 DIAGNOSIS — Z1231 Encounter for screening mammogram for malignant neoplasm of breast: Secondary | ICD-10-CM | POA: Diagnosis not present

## 2015-07-19 DIAGNOSIS — Z853 Personal history of malignant neoplasm of breast: Secondary | ICD-10-CM

## 2015-07-19 DIAGNOSIS — Z9011 Acquired absence of right breast and nipple: Secondary | ICD-10-CM | POA: Insufficient documentation

## 2015-07-25 ENCOUNTER — Ambulatory Visit (INDEPENDENT_AMBULATORY_CARE_PROVIDER_SITE_OTHER): Payer: Medicare Other | Admitting: General Surgery

## 2015-07-25 ENCOUNTER — Encounter: Payer: Self-pay | Admitting: General Surgery

## 2015-07-25 VITALS — BP 110/58 | HR 74 | Resp 14 | Ht 64.0 in | Wt 126.0 lb

## 2015-07-25 DIAGNOSIS — Z86010 Personal history of colonic polyps: Secondary | ICD-10-CM | POA: Diagnosis not present

## 2015-07-25 DIAGNOSIS — Z853 Personal history of malignant neoplasm of breast: Secondary | ICD-10-CM

## 2015-07-25 DIAGNOSIS — Z8509 Personal history of malignant neoplasm of other digestive organs: Secondary | ICD-10-CM | POA: Diagnosis not present

## 2015-07-25 NOTE — Patient Instructions (Addendum)
The patient has been asked to return to the office in one year with a left screening mammogram. 

## 2015-07-25 NOTE — Progress Notes (Signed)
Patient ID: Rachael Castro, female   DOB: 03/13/1931, 81 y.o.   MRN: [id number]  Chief Complaint  Patient presents with  . Follow-up    left breast mammogram    HPI Rachael Castro is a 81 y.o. female who presents for a breast cancer follow up.Mammogram was done on 07/19/15. Patient does perform regular self breast checks and gets regular mammograms done.    HPI  Past Medical History  Diagnosis Date  . Hypertension   . Diabetes mellitus   . GERD (gastroesophageal reflux disease)   . Neuromuscular disorder (HCC)     hx shingles  . Hypercholesteremia   . Personal history of colonic polyps   . Thyroid disease   . b 1999    breast cancer  . Malignant neoplasm of other specified sites of stomach 2013    GIST    Past Surgical History  Procedure Laterality Date  . Mastectomy    . Abdominal hysterectomy    . Tubal ligation    . Tonsillectomy    . Appendectomy    . Eus  02/12/2012    Procedure: UPPER ENDOSCOPIC ULTRASOUND (EUS) LINEAR;  Surgeon: Milus Banister, MD;  Location: [facility];  Service: Endoscopy;  Laterality: N/A;  radial linear  . Hernia repair  2012  . Salpingoophorectomy Right 1979  . Thyroidectomy  1978  . Breast surgery Right 1999    mastectomy  . Laparotomy  2013    excision of gastric wall mass   . Eye surgery Right 2013    cataract  . Upper gi endoscopy  2013  . Colonoscopy  2008    Zahari Fazzino  . Cataract extraction w/phaco Left 04/30/2015    Procedure: CATARACT EXTRACTION PHACO AND INTRAOCULAR LENS PLACEMENT (IOC);  Surgeon: Estill Cotta, MD;  Location: [facility];  Service: Ophthalmology;  Laterality: Left;  [place] 01:39 AP% 23.3 CDE 40.42 fluid pack lot # [id number]    Family History  Problem Relation Age of Onset  . Cancer Mother   . Cancer Father     Social History Social History  Substance Use Topics  . Smoking status: Never Smoker   . Smokeless tobacco: Never Used  . Alcohol Use: No    No Known Allergies  Current Outpatient  Prescriptions  Medication Sig Dispense Refill  . aspirin 81 MG tablet Take 81 mg by mouth daily.    Marland Kitchen atorvastatin (LIPITOR) 40 MG tablet Take 1 tablet (40 mg total) by mouth daily. 30 tablet 2  . calcium citrate-vitamin D 200-200 MG-UNIT TABS Take 1 tablet by mouth daily.    . DULoxetine (CYMBALTA) 30 MG capsule Limit one tab by mouth per day or twice a day if tolerated 180 capsule 0  . DUREZOL 0.05 % EMUL INSTILL 1 DROP 2 TIMES A DAY INTO INTO LEFT EYE AFTER PATCH IS REMOVED  0  . Gabapentin, Once-Daily, 300 MG TABS Limit one tab by mouth per day or twice a day if tolerated 180 tablet 0  . ILEVRO 0.3 % ophthalmic suspension 1 DROP STARTING 2 DAYS PRIOR TO SURGERY IN OP EYE AND 1 DROP AM OF SURGERY/CONTINUE W/1DROP TILL DON  0  . losartan (COZAAR) 50 MG tablet Take 50 mg by mouth every other day.    . Multiple Vitamin (MULTIVITAMIN) tablet Take 1 tablet by mouth daily.    Marland Kitchen omeprazole (PRILOSEC) 40 MG capsule Take 40 mg by mouth daily.    Marland Kitchen thiamine (VITAMIN B-1) 100 MG tablet Take 100 mg by  mouth daily.     No current facility-administered medications for this visit.    Review of Systems Review of Systems  Constitutional: Negative.   Respiratory: Negative.   Cardiovascular: Negative.     Blood pressure 110/58, pulse 74, resp. rate 14, height 5\' 4"  (1.626 m), weight 126 lb (57.153 kg).  Physical Exam Physical Exam  Constitutional: She is oriented to person, place, and time. She appears well-developed and well-nourished.  Eyes: Conjunctivae are normal. No scleral icterus.  Neck: Neck supple.  Cardiovascular: Normal rate, regular rhythm and normal heart sounds.   Pulmonary/Chest: Effort normal and breath sounds normal. Left breast exhibits no inverted nipple, no mass, no nipple discharge, no skin change and no tenderness.  Right mastectomy is clean and well healed. No recurrence.   Abdominal: Soft. Normal appearance and bowel sounds are normal. There is no hepatomegaly. There is no  tenderness. No hernia.  Lymphadenopathy:    She has no cervical adenopathy.    She has no axillary adenopathy.  Neurological: She is alert and oriented to person, place, and time.  Skin: Skin is warm and dry.    Data Reviewed Left mammogram reviewed   Assessment    Stable exam. With history of right breast cancer in 1999 and GIST in 2013    Plan    The patient has been asked to return to the office in one year with a left screening mammogram.      PCP:  Kathlene November 07/26/2015, 9:38 AM

## 2015-07-26 ENCOUNTER — Encounter: Payer: Self-pay | Admitting: General Surgery

## 2015-09-05 ENCOUNTER — Encounter: Payer: Self-pay | Admitting: Obstetrics and Gynecology

## 2015-09-05 ENCOUNTER — Ambulatory Visit
Admission: RE | Admit: 2015-09-05 | Discharge: 2015-09-05 | Disposition: A | Discharge status: Home / Self Care | Payer: Medicare Other | Source: Ambulatory Visit | Attending: Urology | Admitting: Urology

## 2015-09-05 ENCOUNTER — Ambulatory Visit (INDEPENDENT_AMBULATORY_CARE_PROVIDER_SITE_OTHER): Payer: Medicare Other | Admitting: Obstetrics and Gynecology

## 2015-09-05 VITALS — BP 146/73 | HR 71 | Resp 16 | Ht 61.0 in | Wt 136.1 lb

## 2015-09-05 DIAGNOSIS — N200 Calculus of kidney: Secondary | ICD-10-CM

## 2015-09-05 DIAGNOSIS — R938 Abnormal findings on diagnostic imaging of other specified body structures: Secondary | ICD-10-CM | POA: Diagnosis not present

## 2015-09-05 LAB — MICROSCOPIC EXAMINATION: RBC, UA: NONE SEEN /hpf (ref 0–?)

## 2015-09-05 LAB — URINALYSIS, COMPLETE
BILIRUBIN UA: NEGATIVE
GLUCOSE, UA: NEGATIVE
Ketones, UA: NEGATIVE
Nitrite, UA: NEGATIVE
PH UA: 5.5 (ref 5.0–7.5)
PROTEIN UA: NEGATIVE
RBC UA: NEGATIVE
Specific Gravity, UA: 1.025 (ref 1.005–1.030)
Urobilinogen, Ur: 0.2 mg/dL (ref 0.2–1.0)

## 2015-09-05 NOTE — Progress Notes (Signed)
09/05/2015 7:29 PM   Rachael Castro 1930/12/25 [id number]  Referring provider: Ashok Norris, MD [address] [place],  [address]  Chief Complaint  Patient presents with  . Nephrolithiasis    HPI: With a history of kidney stones presenting today for 1 year follow-up after surveillance KUB. Patient has a history of breast and colon cancer and underwent a recent CT abdomen and pelvis without contrast. Multiple nonobstructing stones were seen on scan without hydronephrosis. Patient reports that she has been feeling well she has not passed any stones to her knowledge. She denies any fevers or flank pain. No urinary symptoms.  05/30/15 Cr 0.67  CT A/P wo contrast IMPRESSION: Postsurgical changes related to prior gastric resection. No evidence of recurrent or metastatic disease in the abdomen/pelvis. Multiple nonobstructing bilateral renal calculi measuring up to 2 mm. No hydronephrosis.  PMH: Past Medical History  Diagnosis Date  . Hypertension   . Diabetes mellitus   . GERD (gastroesophageal reflux disease)   . Neuromuscular disorder (HCC)     hx shingles  . Hypercholesteremia   . Personal history of colonic polyps   . Thyroid disease   . b 1999    breast cancer  . Malignant neoplasm of other specified sites of stomach 2013    GIST    Surgical History: Past Surgical History  Procedure Laterality Date  . Mastectomy    . Abdominal hysterectomy    . Tubal ligation    . Tonsillectomy    . Appendectomy    . Eus  02/12/2012    Procedure: UPPER ENDOSCOPIC ULTRASOUND (EUS) LINEAR;  Surgeon: Milus Banister, MD;  Location: [facility];  Service: Endoscopy;  Laterality: N/A;  radial linear  . Hernia repair  2012  . Salpingoophorectomy Right 1979  . Thyroidectomy  1978  . Breast surgery Right 1999    mastectomy  . Laparotomy  2013    excision of gastric wall mass   . Eye surgery Right 2013    cataract  . Upper gi endoscopy  2013  . Colonoscopy   2008    Sankar  . Cataract extraction w/phaco Left 04/30/2015    Procedure: CATARACT EXTRACTION PHACO AND INTRAOCULAR LENS PLACEMENT (IOC);  Surgeon: Estill Cotta, MD;  Location: [facility];  Service: Ophthalmology;  Laterality: Left;  [place] 01:39 AP% 23.3 CDE 40.42 fluid pack lot # [id number]    Home Medications:    Medication List       This list is accurate as of: 09/05/15 11:59 PM.  Always use your most recent med list.               aspirin 81 MG tablet  Take 81 mg by mouth daily.     atorvastatin 40 MG tablet  Commonly known as:  LIPITOR  Take 1 tablet (40 mg total) by mouth daily.     calcium citrate-vitamin D 200-200 MG-UNIT Tabs  Take 1 tablet by mouth daily.     DULoxetine 30 MG capsule  Commonly known as:  CYMBALTA  Limit one tab by mouth per day or twice a day if tolerated     Gabapentin (Once-Daily) 300 MG Tabs  Limit one tab by mouth per day or twice a day if tolerated     losartan 50 MG tablet  Commonly known as:  COZAAR  Take 50 mg by mouth every other day.     multivitamin tablet  Take 1 tablet by mouth daily.     omeprazole 40  MG capsule  Commonly known as:  PRILOSEC  Take 40 mg by mouth daily.     thiamine 100 MG tablet  Commonly known as:  VITAMIN B-1  Take 100 mg by mouth daily.        Allergies: No Known Allergies  Family History: Family History  Problem Relation Age of Onset  . Cancer Mother   . Cancer Father     Social History:  reports that she has never smoked. She has never used smokeless tobacco. She reports that she does not drink alcohol or use illicit drugs.  ROS: UROLOGY Frequent Urination?: No Hard to postpone urination?: No Burning/pain with urination?: No Get up at night to urinate?: No Leakage of urine?: No Urine stream starts and stops?: No Trouble starting stream?: No Do you have to strain to urinate?: No Blood in urine?: No Urinary tract infection?: No Sexually transmitted disease?: No Injury to kidneys  or bladder?: No Painful intercourse?: No Weak stream?: No Currently pregnant?: No Vaginal bleeding?: No Last menstrual period?: n  Gastrointestinal Nausea?: No Vomiting?: No Indigestion/heartburn?: No Diarrhea?: No Constipation?: No  Constitutional Fever: No Night sweats?: Yes Weight loss?: No Fatigue?: No  Skin Skin rash/lesions?: No Itching?: No  Eyes Blurred vision?: No Double vision?: No  Ears/Nose/Throat Sore throat?: No Sinus problems?: No  Hematologic/Lymphatic Swollen glands?: No Easy bruising?: No  Cardiovascular Leg swelling?: No Chest pain?: No  Respiratory Cough?: No Shortness of breath?: No  Endocrine Excessive thirst?: No  Musculoskeletal Back pain?: No Joint pain?: No  Neurological Headaches?: No Dizziness?: No  Psychologic Depression?: No Anxiety?: No  Physical Exam: BP 146/73 mmHg  Pulse 71  Resp 16  Ht 5\' 1"  (1.549 m)  Wt 136 lb 1.6 oz (61.735 kg)  BMI 25.73 kg/m2  Constitutional:  Alert and oriented, No acute distress. GI: Abdomen is soft, nontender, nondistended, no abdominal masses GU: No CVA tenderness.  Skin: No rashes, bruises or suspicious lesions. Neurologic: Grossly intact, no focal deficits, moving all 4 extremities. Psychiatric: Normal mood and affect.  Laboratory Data:   Urinalysis Results for orders placed or performed in visit on 09/05/15  Microscopic Examination  Result Value Ref Range   WBC, UA 0-5 0 -  5 /hpf   RBC, UA None seen 0 -  2 /hpf   Epithelial Cells (non renal) 0-10 0 - 10 /hpf   Mucus, UA Present (A) Not Estab.   Bacteria, UA Few (A) None seen/Few  Urinalysis, Complete  Result Value Ref Range   Specific Gravity, UA 1.025 1.005 - 1.030   pH, UA 5.5 5.0 - 7.5   Color, UA Yellow Yellow   Appearance Ur Clear Clear   Leukocytes, UA Trace (A) Negative   Protein, UA Negative Negative/Trace   Glucose, UA Negative Negative   Ketones, UA Negative Negative   RBC, UA Negative Negative    Bilirubin, UA Negative Negative   Urobilinogen, Ur 0.2 0.2 - 1.0 mg/dL   Nitrite, UA Negative Negative   Microscopic Examination See below:      Pertinent Imaging: CLINICAL DATA: GIST status post resection and chemotherapy. History of right breast cancer status post mastectomy and chemotherapy. Status post hysterectomy.  EXAM: CT ABDOMEN AND PELVIS WITHOUT CONTRAST  TECHNIQUE: Multidetector CT imaging of the abdomen and pelvis was performed following the standard protocol without IV contrast.  COMPARISON: 05/19/2014  FINDINGS: Lower chest: Lung bases are clear.  Status post right mastectomy.  Hepatobiliary: Unenhanced liver is notable for a calcified granuloma in the  posterior right hepatic lobe (series 2/image 8).  Gallbladder is underdistended. No intrahepatic or extrahepatic ductal dilatation.  Pancreas: Within normal limits.  Spleen: Within normal limits.  Adrenals/Urinary Tract: Adrenal glands are unremarkable.  Scattered bilateral nonobstructing renal calculi measuring 1-2 mm. 6 mm probable cyst in the posterior right upper pole (series 2/ image 17). No hydronephrosis.  Bladder is thick-walled.  Stomach/Bowel: Stomach is notable for a small hiatal hernia and postsurgical changes related to prior gastric resection.  No evidence of bowel obstruction  Vascular/Lymphatic: Atherosclerotic calcifications of the abdominal aorta and branch vessels.  No suspicious abdominopelvic lymphadenopathy.  Reproductive: Status post hysterectomy.  Right ovary is within normal limits. No left adnexal mass.  Other: No abdominopelvic ascites.  Postsurgical changes in the left anterior abdominal wall.  Musculoskeletal: Visualized osseous structures are within normal limits.  IMPRESSION: Postsurgical changes related to prior gastric resection.  No evidence of recurrent or metastatic disease in the abdomen/pelvis.  Multiple nonobstructing  bilateral renal calculi measuring up to 2 mm. No hydronephrosis.  Thick-walled bladder. Electronically Signed  By: Julian Hy M.D.  On: 04/17/2015 13:56  Assessment & Plan:    1. Nephrolithiasis- Multiple nonobstructing bilateral renal calculi measuring up to 2 mm. No hydronephrosis. Patient asymptomatic and does not desire any treatment at this time.  - Urinalysis, Complete   Return in about 1 year (around 09/04/2016) for renal stones.  These notes generated with voice recognition software. I apologize for typographical errors.  Herbert Moors, [facility] [address] [place], [place] [address] [phone]

## 2015-10-11 ENCOUNTER — Encounter: Payer: Self-pay | Admitting: Pain Medicine

## 2015-10-11 ENCOUNTER — Ambulatory Visit: Payer: Medicare Other | Attending: Pain Medicine | Admitting: Pain Medicine

## 2015-10-11 VITALS — BP 135/69 | HR 69 | Temp 98.2°F | Resp 18 | Ht 62.0 in | Wt 133.0 lb

## 2015-10-11 DIAGNOSIS — M5136 Other intervertebral disc degeneration, lumbar region: Secondary | ICD-10-CM | POA: Insufficient documentation

## 2015-10-11 DIAGNOSIS — M47812 Spondylosis without myelopathy or radiculopathy, cervical region: Secondary | ICD-10-CM | POA: Insufficient documentation

## 2015-10-11 DIAGNOSIS — B0223 Postherpetic polyneuropathy: Secondary | ICD-10-CM | POA: Diagnosis not present

## 2015-10-11 DIAGNOSIS — M545 Low back pain: Secondary | ICD-10-CM | POA: Diagnosis present

## 2015-10-11 DIAGNOSIS — M5030 Other cervical disc degeneration, unspecified cervical region: Secondary | ICD-10-CM

## 2015-10-11 DIAGNOSIS — R51 Headache: Secondary | ICD-10-CM | POA: Diagnosis not present

## 2015-10-11 DIAGNOSIS — M79609 Pain in unspecified limb: Secondary | ICD-10-CM | POA: Diagnosis not present

## 2015-10-11 DIAGNOSIS — M5416 Radiculopathy, lumbar region: Secondary | ICD-10-CM | POA: Diagnosis not present

## 2015-10-11 DIAGNOSIS — B0229 Other postherpetic nervous system involvement: Secondary | ICD-10-CM | POA: Diagnosis not present

## 2015-10-11 DIAGNOSIS — M4802 Spinal stenosis, cervical region: Secondary | ICD-10-CM | POA: Insufficient documentation

## 2015-10-11 DIAGNOSIS — M79604 Pain in right leg: Secondary | ICD-10-CM | POA: Diagnosis present

## 2015-10-11 DIAGNOSIS — M51369 Other intervertebral disc degeneration, lumbar region without mention of lumbar back pain or lower extremity pain: Secondary | ICD-10-CM

## 2015-10-11 DIAGNOSIS — M47817 Spondylosis without myelopathy or radiculopathy, lumbosacral region: Secondary | ICD-10-CM | POA: Diagnosis not present

## 2015-10-11 MED ORDER — LIDOCAINE 5 % EX PTCH: MEDICATED_PATCH | CUTANEOUS | Status: DC

## 2015-10-11 MED ORDER — GABAPENTIN (ONCE-DAILY) 300 MG PO TABS: ORAL_TABLET | ORAL | Status: DC

## 2015-10-11 MED ORDER — DULOXETINE HCL 30 MG PO CPEP: ORAL_CAPSULE | ORAL | Status: DC

## 2015-10-11 NOTE — Patient Instructions (Addendum)
PLAN   Continue present medication Cymbalta and Neurontin Take vitamin B complex to have burning sensation of the right lower extremity and begin Lidoderm patch  F/U PCP Dr. Lucita Lora for evaliation of  BP and general medical  condition  F/U surgical evaluation. May consider pending follow-up evaluations  F/U oncology as planned  F/U urologist for nephrolithiasis as planned  F/U neurological evaluation. May consider pending follow-up evaluations  May consider radiofrequency rhizolysis or intraspinal procedures pending response to present treatment and F/U evaluation   Patient to call [facility] Center should patient have concerns prior to scheduled return appointment.

## 2015-10-11 NOTE — Progress Notes (Signed)
   Subjective:    Patient ID: Rachael Castro, female    DOB: [birth year], 81 y.o.   MRN: [id number]  HPI    Review of Systems     Objective:   Physical Exam        Assessment & Plan:

## 2015-10-11 NOTE — Progress Notes (Signed)
Safety precautions to be maintained throughout the outpatient stay will include: orient to surroundings, keep bed in low position, maintain call bell within reach at all times, provide assistance with transfer out of bed and ambulation.  

## 2015-10-11 NOTE — Progress Notes (Signed)
Subjective:    Patient ID: Rachael Castro, female    DOB: June 06, 1931, 81 y.o.   MRN: [id number]  HPI  The patient is an 81 year old female who returns to pain management Center for further evaluation and treatment of pain involving the lower back and lower extremity region especially the right lower extremity region as well as the neck and headaches and upper extremity regions. The patient is with burning stinging sensation of the lateral and lower aspect of the right lower extremity which is area of previous shingles. Patient's pain is felt to be due to postherpetic neuralgia. We discussed patient's treatment regimen and patient continues Cymbalta and Neurontin and decision has been made to begin Lidoderm patch applications in attempt to decrease severe burning sensation of the right lower extremity. We will avoid interventional treatment at this time and patient is to call pain management should they be significant change in condition or should patient have undesirable side effects due to the Lidoderm patch have other concerns regarding condition prior to scheduled return appointment. The patient states she is without significant pain involving the neck or headaches or upper extremity regions. We will continue the present treatment regimen consisting of Cymbalta and Neurontin and we'll remain available to consider interventional treatment as well as modification of other aspects of treatment regimen pending response to the present treatment regimen. The patient was with understanding and agreed with suggested treatment plan      Review of Systems     Objective:   Physical Exam  There was minimal tenderness to palpation of paraspinal musculature region the cervical region cervical facet region. There was unremarkable Spurling's maneuver with mild tenderness to palpation over the acromioclavicular and glenohumeral joint regions. Patient appeared to be with bilaterally equal grip strength and  Tinel and Phalen's maneuver were without increased pain of significant degree. Palpation over the thoracic facet thoracic paraspinal musculature regions reproduced pain of mild degree with no crepitus of the thoracic region noted. Palpation over the lumbar paraspinal musculature region lumbar facet region was attends to palpation of minimal mild degree with lateral bending rotation extension and palpation over the lumbar facets reproducing mild discomfort. Straight leg raise was tolerates possibly 30 without increased pain with dorsiflexion noted. There was tenderness to palpation right lower extremity distal to the knee and lateral aspect. There was increased sensitivity to touch of the right lower extremity in this region with some findings suggestive of allodynia. There was negative clonus negative Homans. DTRs were difficult to elicit patient had difficulty relaxing area there was negative clonus negative Homans. Abdomen nontender with no costovertebral tenderness noted   Assessment & Plan:    Postherpetic neuralgia of the right lower extremity Lumbar and lower extremity region on the right  Degenerative disc disease lumbar spine  Degenerative disc disease cervical spine C3-4 degenerative changes with foraminal stenosis on the left C5-6 stenosis with right C7 stenosis with multilevel degenerative changes noted throughout the cervical spine     PLAN   Continue present medication Cymbalta and Neurontin Take vitamin B complex to have burning sensation of the right lower extremity and begin Lidoderm patch  F/U PCP Dr. Lucita Lora for evaliation of  BP and general medical  condition  F/U surgical evaluation. May consider pending follow-up evaluations  F/U oncology as planned  F/U urologist for nephrolithiasis as planned  F/U neurological evaluation. May consider pending follow-up evaluations  May consider radiofrequency rhizolysis or intraspinal procedures pending response to present  treatment and F/U evaluation  Patient to call Pain Management Center should patient have concerns prior to scheduled return appointment.

## 2015-10-23 ENCOUNTER — Telehealth: Payer: Self-pay | Admitting: Hematology and Oncology

## 2015-10-23 ENCOUNTER — Ambulatory Visit (INDEPENDENT_AMBULATORY_CARE_PROVIDER_SITE_OTHER): Payer: Medicare Other | Admitting: Family Medicine

## 2015-10-23 ENCOUNTER — Other Ambulatory Visit: Payer: Self-pay | Admitting: Hematology and Oncology

## 2015-10-23 ENCOUNTER — Encounter: Payer: Self-pay | Admitting: Family Medicine

## 2015-10-23 VITALS — BP 130/82 | HR 75 | Temp 98.6°F | Resp 16 | Ht 62.0 in | Wt 131.4 lb

## 2015-10-23 DIAGNOSIS — Z853 Personal history of malignant neoplasm of breast: Secondary | ICD-10-CM

## 2015-10-23 DIAGNOSIS — M5136 Other intervertebral disc degeneration, lumbar region: Secondary | ICD-10-CM

## 2015-10-23 DIAGNOSIS — Z8509 Personal history of malignant neoplasm of other digestive organs: Secondary | ICD-10-CM

## 2015-10-23 DIAGNOSIS — E1169 Type 2 diabetes mellitus with other specified complication: Secondary | ICD-10-CM

## 2015-10-23 DIAGNOSIS — E782 Mixed hyperlipidemia: Secondary | ICD-10-CM | POA: Insufficient documentation

## 2015-10-23 DIAGNOSIS — I10 Essential (primary) hypertension: Secondary | ICD-10-CM | POA: Diagnosis not present

## 2015-10-23 DIAGNOSIS — B0229 Other postherpetic nervous system involvement: Secondary | ICD-10-CM | POA: Diagnosis not present

## 2015-10-23 DIAGNOSIS — Z85028 Personal history of other malignant neoplasm of stomach: Secondary | ICD-10-CM

## 2015-10-23 DIAGNOSIS — E785 Hyperlipidemia, unspecified: Secondary | ICD-10-CM | POA: Diagnosis not present

## 2015-10-23 DIAGNOSIS — E119 Type 2 diabetes mellitus without complications: Secondary | ICD-10-CM | POA: Insufficient documentation

## 2015-10-23 LAB — POCT GLYCOSYLATED HEMOGLOBIN (HGB A1C): Hemoglobin A1C: 7.1

## 2015-10-23 LAB — GLUCOSE, POCT (MANUAL RESULT ENTRY): POC Glucose: 121 mg/dl — AB (ref 70–99)

## 2015-10-23 MED ORDER — LOSARTAN POTASSIUM 50 MG PO TABS: 50.0000 mg | ORAL_TABLET | ORAL | Status: DC

## 2015-10-23 MED ORDER — ATORVASTATIN CALCIUM 40 MG PO TABS: 40.0000 mg | ORAL_TABLET | Freq: Every day | ORAL | Status: DC

## 2015-10-23 MED ORDER — OMEPRAZOLE 40 MG PO CPDR: 40.0000 mg | DELAYED_RELEASE_CAPSULE | Freq: Every day | ORAL | Status: DC

## 2015-10-23 NOTE — Telephone Encounter (Signed)
Re:  CA27.29  Spoke to patient about follow-up lab this month.  Last check was coming down (near normal).  She is asymptomatic.  She has a history of both breast cancer and GIST.    She recently had a KUB to look for stones.  Imaging studies since last appointment were reviewed.  I encouraged her to keep her appt in 11/2015 for CT scan of the abdomen and pelvis.  If CA27.29 risng, would consider adding chest CT.  No bone symptoms.  Lequita Asal, MD

## 2015-10-23 NOTE — Progress Notes (Signed)
Name: Rachael Castro   MRN: [id number]    DOB: 01/27/1931   Date:10/23/2015       Progress Note  Subjective  Chief Complaint  Chief Complaint  Patient presents with  . Hypertension    4 month follow up  . Diabetes  . Hyperlipidemia    HPI  Diabetes  Patient presents for follow-up of diabetes which is present for over 5 years. Is currently on a regimen of diet and exercise only . Patient states compliant with their diet and exercise. There's been no hypoglycemic episodes and there no polyuria polydipsia polyphagia. His average fasting glucoses been in the low around - with a high around - . There is no end organ disease.  Last diabetic eye exam was last year.   Last visit with dietitian was several years ago. Last microalbumin was obtained 20 in August 16 .   Hypertension   Patient presents for follow-up of hypertension. It has been present for over 5 years.  Patient states that there is compliance with medical regimen which consists of losartan 50 mg daily . There is no end organ disease. Cardiac risk factors include hypertension hyperlipidemia and diabetes.  Exercise regimen consist of some walking .  Diet consist of salt restriction .  Hyperlipidemia  Patient has a history of hyperlipidemia for over 5 years.  Current medical regimen consist of atorvastatin 40 mg daily at bedtime .  Compliance is good .  Diet and exercise are currently followed regularly .  Risk factors for cardiovascular disease include hyperlipidemia hypertension and advanced age and diabetes .   There have been no side effects from the medication.    Postherpetic neuropathy  Patient continues under the care of her pain management specialist with Cymbalta and gabapentin and intermittent injections. She is doing fine on this regimen.  Past Medical History  Diagnosis Date  . Hypertension   . Diabetes mellitus   . GERD (gastroesophageal reflux disease)   . Neuromuscular disorder (HCC)     hx shingles  .  Hypercholesteremia   . Personal history of colonic polyps   . Thyroid disease   . b 1999    breast cancer  . Malignant neoplasm of other specified sites of stomach 2013    GIST    Social History  Substance Use Topics  . Smoking status: Never Smoker   . Smokeless tobacco: Never Used  . Alcohol Use: No     Current outpatient prescriptions:  .  aspirin 81 MG tablet, Take 81 mg by mouth daily., Disp: , Rfl:  .  atorvastatin (LIPITOR) 40 MG tablet, Take 1 tablet (40 mg total) by mouth daily. Put on file, Disp: 30 tablet, Rfl: 5 .  calcium citrate-vitamin D 200-200 MG-UNIT TABS, Take 1 tablet by mouth daily., Disp: , Rfl:  .  DULoxetine (CYMBALTA) 30 MG capsule, Limit 1-2 tabs by mouth per day if tolerated, Disp: 180 capsule, Rfl: 0 .  Gabapentin, Once-Daily, 300 MG TABS, Limit one tab by mouth per day or twice a day if tolerated, Disp: 180 tablet, Rfl: 0 .  lidocaine (LIDODERM) 5 %, Apply a portion of patch or entire patch to the right lower extremity in the area of pain for 12 hours then remove for 12 hours for treatment of postherpetic neuralgia and repeat process if tolerated, Disp: 30 patch, Rfl: 2 .  losartan (COZAAR) 50 MG tablet, Take 1 tablet (50 mg total) by mouth every other day., Disp: 30 tablet, Rfl: 5 .  Multiple Vitamin (MULTIVITAMIN) tablet, Take 1 tablet by mouth daily., Disp: , Rfl:  .  omeprazole (PRILOSEC) 40 MG capsule, Take 1 capsule (40 mg total) by mouth daily., Disp: 30 capsule, Rfl: 5 .  thiamine (VITAMIN B-1) 100 MG tablet, Take 100 mg by mouth daily., Disp: , Rfl:   No Known Allergies  Review of Systems  Constitutional: Negative for fever, chills and weight loss.  HENT: Negative for congestion, hearing loss, sore throat and tinnitus.   Eyes: Negative for blurred vision, double vision and redness.  Respiratory: Negative for cough, hemoptysis and shortness of breath.   Cardiovascular: Negative for chest pain, palpitations, orthopnea, claudication and leg  swelling.  Gastrointestinal: Negative for heartburn, nausea, vomiting, diarrhea, constipation and blood in stool.  Genitourinary: Negative for dysuria, urgency, frequency and hematuria.  Musculoskeletal: Positive for back pain and joint pain. Negative for myalgias, falls and neck pain.  Skin: Negative for itching.  Neurological: Positive for tingling and sensory change. Negative for dizziness, tremors, focal weakness, seizures, loss of consciousness, weakness and headaches.  Endo/Heme/Allergies: Does not bruise/bleed easily.  Psychiatric/Behavioral: Negative for depression and substance abuse. The patient is not nervous/anxious and does not have insomnia.      Objective  Filed Vitals:   10/23/15 0908  BP: 130/82  Pulse: 75  Temp: 98.6 F (37 C)  TempSrc: Oral  Resp: 16  Height: 5\' 2"  (1.575 m)  Weight: 131 lb 6.4 oz (59.603 kg)  SpO2: 95%     Physical Exam  Constitutional: She is oriented to person, place, and time and well-developed, well-nourished, and in no distress.  HENT:  Head: Normocephalic.  Eyes: EOM are normal. Pupils are equal, round, and reactive to light.  Neck: Normal range of motion. No thyromegaly present.  Cardiovascular: Normal rate, regular rhythm, normal heart sounds and intact distal pulses.   No murmur heard. Pulmonary/Chest: Effort normal and breath sounds normal.  Abdominal: Soft. Bowel sounds are normal.  Musculoskeletal: Normal range of motion. She exhibits no edema.  Neurological: She is alert and oriented to person, place, and time. No cranial nerve deficit. Gait normal.  Skin: Skin is warm and dry. No rash noted.  Psychiatric: Memory and affect normal.      Assessment & Plan  1. Type 2 diabetes mellitus with other specified complication (HCC) At goal for age - POCT HgB A1C - POCT Glucose (CBG)  2. Essential hypertension Well-controlled  3. Hyperlipidemia Well-controlled  4. DDD (degenerative disc disease), lumbar Her pain  management  5. Postherpetic neuralgia Per pain management  6. Personal history of malignant neoplasm of breast Follow-up by her oncologist and surgeon  7. History of stomach cancer Followed by oncology and surgeon  8. H/O malignant gastrointestinal stromal tumor (GIST) Followed by oncology and surgery

## 2015-11-05 ENCOUNTER — Inpatient Hospital Stay: Payer: Medicare Other | Attending: Hematology and Oncology

## 2015-11-05 DIAGNOSIS — Z853 Personal history of malignant neoplasm of breast: Secondary | ICD-10-CM

## 2015-11-05 DIAGNOSIS — D481 Neoplasm of uncertain behavior of connective and other soft tissue: Secondary | ICD-10-CM | POA: Diagnosis present

## 2015-11-06 LAB — CANCER ANTIGEN 27.29: CA 27.29: 42.9 U/mL — ABNORMAL HIGH (ref 0.0–38.6)

## 2015-12-01 ENCOUNTER — Encounter: Payer: Self-pay | Admitting: Hematology and Oncology

## 2015-12-01 NOTE — Progress Notes (Signed)
[facility] day:  05/30/2015  Chief Complaint: Rachael Castro is a 82 y.o. female with a street of right breast cancer (1999) and gastrointestinal stromal tumor (2013) who is seen for review of interval CT scan and 3 month assessment.  HPI: The patient was last seen in the medical oncology clinic on 02/14/2015.  At that time, she was seen for initial assessment by me.   She had a dstant history of right breast cancer and had received 4 cycles of AC followed by tamoxifen then extended therapy with Femara. Mammogram on 07/17/2014 had revealed no evidence of malignancy.   She also had a history of gastrointestinal intestinal stromal tumor (GIST) status post resection followed by a year of adjuvant Gleevec.  He was due for follow-up imaging.  Labs at last visit included a normal CBC and CMP.  Abdominal and pelvic CT scan on 04/17/2015 revealed postsurgical changes related to the prior gastric resection. There was no evidence of recurrent or metastatic disease. There were multiple nonobstructing bilateral renal calculi measuring up to 2 mm. There was no hydronephrosis. She has a thick walled bladder.  During the interim, she has done well. She voices no concerns.   She has had no issues with kidney stones. She sees a urologist in November. She has had cataract surgery on the left side.  She got an infection. Now she is doing well.  Past Medical History  Diagnosis Date  . Hypertension   . Diabetes mellitus   . GERD (gastroesophageal reflux disease)   . Neuromuscular disorder (HCC)     hx shingles  . Hypercholesteremia   . Personal history of colonic polyps   . Thyroid disease   . b 1999    breast cancer  . Malignant neoplasm of other specified sites of stomach 2013    GIST    Past Surgical History  Procedure Laterality Date  . Mastectomy    . Abdominal hysterectomy    . Tubal ligation    . Tonsillectomy    . Appendectomy    . Eus   02/12/2012    Procedure: UPPER ENDOSCOPIC ULTRASOUND (EUS) LINEAR;  Surgeon: Milus Banister, MD;  Location: [facility];  Service: Endoscopy;  Laterality: N/A;  radial linear  . Hernia repair  2012  . Salpingoophorectomy Right 1979  . Thyroidectomy  1978  . Breast surgery Right 1999    mastectomy  . Laparotomy  2013    excision of gastric wall mass   . Eye surgery Right 2013    cataract  . Upper gi endoscopy  2013  . Colonoscopy  2008    [facility]  . Cataract extraction w/phaco Left 04/30/2015    Procedure: CATARACT EXTRACTION PHACO AND INTRAOCULAR LENS PLACEMENT (IOC);  Surgeon: Estill Cotta, MD;  Location: [facility] ORS;  Service: Ophthalmology;  Laterality: Left;  [place] 01:39 AP% 23.3 CDE 40.42 fluid pack lot # [id number]    Family History  Problem Relation Age of Onset  . Cancer Mother   . Cancer Father     Social History:  reports that she has never smoked. She has never used smokeless tobacco. She reports that she does not drink alcohol or use illicit drugs.  The patient is alone today.  Allergies: No Known Allergies  Current Medications: Current Outpatient Prescriptions  Medication Sig Dispense Refill  . aspirin 81 MG tablet Take 81 mg by mouth daily.    . calcium citrate-vitamin D 200-200 MG-UNIT TABS Take  1 tablet by mouth daily.    . Multiple Vitamin (MULTIVITAMIN) tablet Take 1 tablet by mouth daily.    Marland Kitchen thiamine (VITAMIN B-1) 100 MG tablet Take 100 mg by mouth daily.    Marland Kitchen atorvastatin (LIPITOR) 40 MG tablet Take 1 tablet (40 mg total) by mouth daily. Put on file 30 tablet 5  . DULoxetine (CYMBALTA) 30 MG capsule Limit 1-2 tabs by mouth per day if tolerated 180 capsule 0  . Gabapentin, Once-Daily, 300 MG TABS Limit one tab by mouth per day or twice a day if tolerated 180 tablet 0  . lidocaine (LIDODERM) 5 % Apply a portion of patch or entire patch to the right lower extremity in the area of pain for 12 hours then remove for 12 hours for treatment of postherpetic neuralgia  and repeat process if tolerated 30 patch 2  . losartan (COZAAR) 50 MG tablet Take 1 tablet (50 mg total) by mouth every other day. 30 tablet 5  . omeprazole (PRILOSEC) 40 MG capsule Take 1 capsule (40 mg total) by mouth daily. 30 capsule 5   No current facility-administered medications for this visit.    Review of Systems:  GENERAL:  Feels good.  Active.  No fevers, sweats or weight loss. PERFORMANCE STATUS (ECOG):  1 HEENT:  No visual changes, runny nose, sore throat, mouth sores or tenderness. Lungs: Mild shortness of breath with exertion.  No cough.  No hemoptysis. Cardiac:  No chest pain, palpitations, orthopnea, or PND. GI:  No nausea, vomiting, diarrhea, constipation, melena or hematochezia. GU:  Kidney stones.  No urgency, frequency, dysuria, or hematuria.  Urology follow-up in 08/2015. Musculoskeletal:  No back pain.  No joint pain.  No muscle tenderness. Extremities:  No pain or swelling. Skin:  No rashes or skin changes. Neuro:  No headache, numbness or weakness, balance or coordination issues. Endocrine:  No diabetes, thyroid issues, hot flashes or night sweats. Psych:  No mood changes, depression or anxiety. Pain:  No focal pain. Review of systems:  All other systems reviewed and found to be negative.   Physical Exam: Blood pressure 133/60, pulse 55, temperature 96.6 F (35.9 C), temperature source Tympanic, resp. rate 18, height '5\' 4"'$  (1.626 m), weight 133 lb 0.8 oz (60.35 kg). GENERAL:  Well developed, well nourished, sitting comfortably in the exam room in no acute distress. MENTAL STATUS:  Alert and oriented to person, place and time. HEAD:  Wearing a white crochet hat.  Curly gray hair.  Normocephalic, atraumatic, face symmetric, no Cushingoid features. EYES:  Glasses.  Pupils equal round and reactive to light and accomodation.  No conjunctivitis or scleral icterus. ENT:  Oropharynx clear without lesion.  Dentures.  Tongue normal. Mucous membranes moist.   RESPIRATORY:  Clear to auscultation without rales, wheezes or rhonchi. CARDIOVASCULAR:  Regular rate and rhythm without murmur, rub or gallop. BREAST:  Right mastectomy.  No erythema or nodularity.  Left breast with inverted nipple.  No masses, skin changes or nipple discharge.  ABDOMEN:  Soft, non-tender, with active bowel sounds, and no hepatosplenomegaly.  No masses. SKIN:  No rashes, ulcers or lesions. EXTREMITIES: No edema, no skin discoloration or tenderness.  No palpable cords. LYMPH NODES: No palpable cervical, supraclavicular, axillary or inguinal adenopathy  NEUROLOGICAL: Unremarkable. PSYCH:  Appropriate.  Office Visit on 05/30/2015  Component Date Value Ref Range Status  . WBC 05/30/2015 7.6  3.6 - 11.0 K/uL Final  . RBC 05/30/2015 3.95  3.80 - 5.20 MIL/uL Final  . Hemoglobin  05/30/2015 12.5  12.0 - 16.0 g/dL Final  . HCT 78/97/8478 37.3  35.0 - 47.0 % Final  . MCV 05/30/2015 94.4  80.0 - 100.0 fL Final  . MCH 05/30/2015 31.6  26.0 - 34.0 pg Final  . MCHC 05/30/2015 33.5  32.0 - 36.0 g/dL Final  . RDW 41/28/2081 13.9  11.5 - 14.5 % Final  . Platelets 05/30/2015 197  150 - 440 K/uL Final  . Neutrophils Relative % 05/30/2015 62   Final  . Neutro Abs 05/30/2015 4.7  1.4 - 6.5 K/uL Final  . Lymphocytes Relative 05/30/2015 30   Final  . Lymphs Abs 05/30/2015 2.3  1.0 - 3.6 K/uL Final  . Monocytes Relative 05/30/2015 6   Final  . Monocytes Absolute 05/30/2015 0.4  0.2 - 0.9 K/uL Final  . Eosinophils Relative 05/30/2015 2   Final  . Eosinophils Absolute 05/30/2015 0.1  0 - 0.7 K/uL Final  . Basophils Relative 05/30/2015 0   Final  . Basophils Absolute 05/30/2015 0.0  0 - 0.1 K/uL Final  . Sodium 05/30/2015 140  135 - 145 mmol/L Final  . Potassium 05/30/2015 4.1  3.5 - 5.1 mmol/L Final  . Chloride 05/30/2015 103  101 - 111 mmol/L Final  . CO2 05/30/2015 32  22 - 32 mmol/L Final  . Glucose, Bld 05/30/2015 150* 65 - 99 mg/dL Final  . BUN 38/87/1957 19  6 - 20 mg/dL Final  .  Creatinine, Ser 05/30/2015 0.67  0.44 - 1.00 mg/dL Final  . Calcium 74/71/8550 9.6  8.9 - 10.3 mg/dL Final  . Total Protein 05/30/2015 7.6  6.5 - 8.1 g/dL Final  . Albumin 15/86/8257 4.3  3.5 - 5.0 g/dL Final  . AST 49/35/5217 21  15 - 41 U/L Final  . ALT 05/30/2015 14  14 - 54 U/L Final  . Alkaline Phosphatase 05/30/2015 73  38 - 126 U/L Final  . Total Bilirubin 05/30/2015 0.6  0.3 - 1.2 mg/dL Final  . GFR calc non Af Amer 05/30/2015 >60  >60 mL/min Final  . GFR calc Af Amer 05/30/2015 >60  >60 mL/min Final   Comment: (NOTE) The eGFR has been calculated using the CKD EPI equation. This calculation has not been validated in all clinical situations. eGFR's persistently <60 mL/min signify possible Chronic Kidney Disease.   . Anion gap 05/30/2015 5  5 - 15 Final  . CA 27.29 05/30/2015 46.6* 0.0 - 38.6 U/mL Final   Comment: (NOTE) [employer]ayer Cent[employer]/ACS methodology Performed At: [facility] [address] [place], [place] [id number] Mila Homer MD DS:[id number]     Assessment:  Rachael Castro is a 82 y.o. female with a history of breast cancer status post right modified radical mastectomy in 1999 followed by 4 cycles of Adriamycin and Cytoxan. She received tamoxifen then extended adjuvant therapy with Femara.  Mammogram on 07/17/2014 revealed no evidence of malignancy.  She has a history of gastrointestinal stromal tumor (GIST) status post resection on 02/27/2012. Pathology revealed a 6.4 cm tumor. She received a year of Gleevec beginning 03/2012. Her dose was reduced secondary to side effects.  Abdominal and pelvic CT scan on 04/17/2015 revealed postsurgical changes related to the prior gastric resection. There was no evidence of recurrent or metastatic disease. There were multiple nonobstructing bilateral renal calculi measuring up to 2 mm. There was no hydronephrosis. She has a thick walled bladder.  Clinically, she feels good. She denies any breast or abdominal symptoms.  Exam is unremarkable.  Plan:  1. Labs today:  CBC with diff, CMP, CA27.29. 2. Review interval abdominal/pelvic CT scan. 3. Follow-up schedule mammogram on 07/18/2015 (ordered by Dr. Jamal Collin). 4. Abdomen and pelvic CT scan in 6 months. 5. RTC in 6 months (after CT scan) for MD assessment, labs (CBC with diff, CMP, CA27.29), and review of mammogram and CT.   Lequita Asal, MD  05/30/2015

## 2015-12-03 ENCOUNTER — Ambulatory Visit
Admission: RE | Admit: 2015-12-03 | Discharge: 2015-12-03 | Disposition: A | Discharge status: Home / Self Care | Payer: Medicare Other | Source: Ambulatory Visit | Attending: Hematology and Oncology | Admitting: Hematology and Oncology

## 2015-12-03 DIAGNOSIS — N200 Calculus of kidney: Secondary | ICD-10-CM | POA: Diagnosis not present

## 2015-12-03 DIAGNOSIS — C49A0 Gastrointestinal stromal tumor, unspecified site: Secondary | ICD-10-CM | POA: Diagnosis present

## 2015-12-05 ENCOUNTER — Inpatient Hospital Stay (HOSPITAL_BASED_OUTPATIENT_CLINIC_OR_DEPARTMENT_OTHER): Payer: Medicare Other | Admitting: Hematology and Oncology

## 2015-12-05 ENCOUNTER — Other Ambulatory Visit: Payer: Self-pay | Admitting: Hematology and Oncology

## 2015-12-05 ENCOUNTER — Inpatient Hospital Stay: Payer: Medicare Other | Attending: Hematology and Oncology

## 2015-12-05 VITALS — BP 158/82 | HR 67 | Temp 95.8°F | Resp 18 | Ht 62.0 in | Wt 134.9 lb

## 2015-12-05 DIAGNOSIS — Z9011 Acquired absence of right breast and nipple: Secondary | ICD-10-CM

## 2015-12-05 DIAGNOSIS — Z87442 Personal history of urinary calculi: Secondary | ICD-10-CM | POA: Diagnosis not present

## 2015-12-05 DIAGNOSIS — Z853 Personal history of malignant neoplasm of breast: Secondary | ICD-10-CM

## 2015-12-05 DIAGNOSIS — Z79899 Other long term (current) drug therapy: Secondary | ICD-10-CM | POA: Insufficient documentation

## 2015-12-05 DIAGNOSIS — Z170 Estrogen receptor positive status [ER+]: Secondary | ICD-10-CM | POA: Insufficient documentation

## 2015-12-05 DIAGNOSIS — Z8509 Personal history of malignant neoplasm of other digestive organs: Secondary | ICD-10-CM | POA: Diagnosis not present

## 2015-12-05 DIAGNOSIS — E079 Disorder of thyroid, unspecified: Secondary | ICD-10-CM | POA: Diagnosis not present

## 2015-12-05 DIAGNOSIS — I10 Essential (primary) hypertension: Secondary | ICD-10-CM | POA: Insufficient documentation

## 2015-12-05 DIAGNOSIS — E119 Type 2 diabetes mellitus without complications: Secondary | ICD-10-CM | POA: Diagnosis not present

## 2015-12-05 DIAGNOSIS — K219 Gastro-esophageal reflux disease without esophagitis: Secondary | ICD-10-CM | POA: Insufficient documentation

## 2015-12-05 DIAGNOSIS — Z7982 Long term (current) use of aspirin: Secondary | ICD-10-CM | POA: Diagnosis not present

## 2015-12-05 DIAGNOSIS — Z86010 Personal history of colonic polyps: Secondary | ICD-10-CM | POA: Diagnosis not present

## 2015-12-05 DIAGNOSIS — E7800 Pure hypercholesterolemia, unspecified: Secondary | ICD-10-CM | POA: Insufficient documentation

## 2015-12-05 LAB — CBC WITH DIFFERENTIAL/PLATELET
Basophils Absolute: 0 10*3/uL (ref 0–0.1)
Basophils Relative: 1 %
Eosinophils Absolute: 0.1 10*3/uL (ref 0–0.7)
Eosinophils Relative: 2 %
HCT: 35.8 % (ref 35.0–47.0)
Hemoglobin: 12.1 g/dL (ref 12.0–16.0)
Lymphocytes Relative: 28 %
Lymphs Abs: 1.8 10*3/uL (ref 1.0–3.6)
MCH: 32.1 pg (ref 26.0–34.0)
MCHC: 33.7 g/dL (ref 32.0–36.0)
MCV: 95.1 fL (ref 80.0–100.0)
Monocytes Absolute: 0.4 10*3/uL (ref 0.2–0.9)
Monocytes Relative: 6 %
Neutro Abs: 4.2 10*3/uL (ref 1.4–6.5)
Neutrophils Relative %: 63 %
Platelets: 203 10*3/uL (ref 150–440)
RBC: 3.77 MIL/uL — ABNORMAL LOW (ref 3.80–5.20)
RDW: 13.9 % (ref 11.5–14.5)
WBC: 6.5 10*3/uL (ref 3.6–11.0)

## 2015-12-05 LAB — COMPREHENSIVE METABOLIC PANEL
ALT: 12 U/L — ABNORMAL LOW (ref 14–54)
AST: 18 U/L (ref 15–41)
Albumin: 4 g/dL (ref 3.5–5.0)
Alkaline Phosphatase: 66 U/L (ref 38–126)
Anion gap: 9 (ref 5–15)
BUN: 14 mg/dL (ref 6–20)
CO2: 27 mmol/L (ref 22–32)
Calcium: 8.8 mg/dL — ABNORMAL LOW (ref 8.9–10.3)
Chloride: 103 mmol/L (ref 101–111)
Creatinine, Ser: 0.67 mg/dL (ref 0.44–1.00)
GFR calc Af Amer: >60 mL/min (ref >60)
GFR calc non Af Amer: >60 mL/min (ref >60)
Glucose, Bld: 183 mg/dL — ABNORMAL HIGH (ref 65–99)
Potassium: 3.5 mmol/L (ref 3.5–5.1)
Sodium: 139 mmol/L (ref 135–145)
Total Bilirubin: 0.7 mg/dL (ref 0.3–1.2)
Total Protein: 6.9 g/dL (ref 6.5–8.1)

## 2015-12-05 NOTE — Progress Notes (Signed)
[facility] day:  12/05/2015  Chief Complaint: Rachael Castro is a 82 y.o. female with a history of right breast cancer (1999) and gastrointestinal stromal tumor (2013) who is seen for review of interval imaging studies and 6 month assessment.  HPI: The patient was last seen in the medical oncology clinic on 05/30/2015.  At that time, she was doing well.  Abdominal and pelvic CT scan on 04/17/2015 had revealed no evidence of recurrent disease.  Left sided mammogram on 07/19/2015 revealed no evidence of malignancy.  Abdominal and pelvic CT scan on 12/03/2015 revealed a stable exam with no evidence of recurrent disease.  There were tiny nonobstructive intrarenal calculi without evidence of hydronephrosis.  During the interim, she states that the oral contrast for her CT scans caused diarrhea. She is now better. She denies any abdominal complaints.  She denies any breast concerns.  Past Medical History  Diagnosis Date  . Hypertension   . Diabetes mellitus   . GERD (gastroesophageal reflux disease)   . Neuromuscular disorder (HCC)     hx shingles  . Hypercholesteremia   . Personal history of colonic polyps   . Thyroid disease   . b 1999    breast cancer  . Malignant neoplasm of other specified sites of stomach 2013    GIST    Past Surgical History  Procedure Laterality Date  . Mastectomy    . Abdominal hysterectomy    . Tubal ligation    . Tonsillectomy    . Appendectomy    . Eus  02/12/2012    Procedure: UPPER ENDOSCOPIC ULTRASOUND (EUS) LINEAR;  Surgeon: Milus Banister, MD;  Location: [facility];  Service: Endoscopy;  Laterality: N/A;  radial linear  . Hernia repair  2012  . Salpingoophorectomy Right 1979  . Thyroidectomy  1978  . Breast surgery Right 1999    mastectomy  . Laparotomy  2013    excision of gastric wall mass   . Eye surgery Right 2013    cataract  . Upper gi endoscopy  2013  . Colonoscopy  2008    S[facility]   . Cataract extraction w/phaco Left 04/30/2015    Procedure: CATARACT EXTRACTION PHACO AND INTRAOCULAR LENS PLACEMENT (IOC);  Surgeon: Estill Cotta, MD;  Location: [facility] ORS;  Service: Ophthalmology;  Laterality: Left;  [place] 01:39 AP% 23.3 CDE 40.42 fluid pack lot # [id number]    Family History  Problem Relation Age of Onset  . Cancer Mother   . Cancer Father     Social History:  reports that she has never smoked. She has never used smokeless tobacco. She reports that she does not drink alcohol or use illicit drugs.  The patient is alone today.  Allergies: No Known Allergies  Current Medications: Current Outpatient Prescriptions  Medication Sig Dispense Refill  . aspirin 81 MG tablet Take 81 mg by mouth daily.    Marland Kitchen atorvastatin (LIPITOR) 40 MG tablet Take 1 tablet (40 mg total) by mouth daily. Put on file 30 tablet 5  . calcium citrate-vitamin D 200-200 MG-UNIT TABS Take 1 tablet by mouth daily.    . DULoxetine (CYMBALTA) 30 MG capsule Limit 1-2 tabs by mouth per day if tolerated 180 capsule 0  . Gabapentin, Once-Daily, 300 MG TABS Limit one tab by mouth per day or twice a day if tolerated 180 tablet 0  . lidocaine (LIDODERM) 5 % Apply a portion of patch or entire patch to the right lower  extremity in the area of pain for 12 hours then remove for 12 hours for treatment of postherpetic neuralgia and repeat process if tolerated 30 patch 2  . losartan (COZAAR) 50 MG tablet Take 1 tablet (50 mg total) by mouth every other day. 30 tablet 5  . Multiple Vitamin (MULTIVITAMIN) tablet Take 1 tablet by mouth daily.    Marland Kitchen omeprazole (PRILOSEC) 40 MG capsule Take 1 capsule (40 mg total) by mouth daily. 30 capsule 5  . thiamine (VITAMIN B-1) 100 MG tablet Take 100 mg by mouth daily.     No current facility-administered medications for this visit.    Review of Systems:  GENERAL:  Feels good.  No fevers, sweats or weight loss. PERFORMANCE STATUS (ECOG):  1 HEENT:  No visual changes, runny nose,  sore throat, mouth sores or tenderness. Lungs: No shortness of breath or cough.  No hemoptysis. Cardiac:  No chest pain, palpitations, orthopnea, or PND. GI:  Interval diarrhea caused by oral contrast.  No nausea, vomiting, constipation, melena or hematochezia. GU:  Kidney stones.  No urgency, frequency, dysuria, or hematuria.  Urology follows. Musculoskeletal:  No back pain.  No joint pain.  No muscle tenderness. Extremities:  No pain or swelling. Skin:  No rashes or skin changes. Neuro:  No headache, numbness or weakness, balance or coordination issues. Endocrine:  No diabetes, thyroid issues, hot flashes or night sweats. Psych:  No mood changes, depression or anxiety. Pain:  No focal pain. Review of systems:  All other systems reviewed and found to be negative.  Physical Exam: Blood pressure 158/82, pulse 67, temperature 95.8 F (35.4 C), temperature source Tympanic, resp. rate 18, height _0  (1.575 m), weight 134 lb 14.7 oz (61.2 kg). GENERAL:  Well developed, well nourished, sitting comfortably in the exam room in no acute distress. MENTAL STATUS:  Alert and oriented to person, place and time. HEAD:  Short curly gray hair.  Normocephalic, atraumatic, face symmetric, no Cushingoid features. EYES:  Glasses.  Brown eyes s/p left cataract removal.  Pupils equal round and reactive to light and accomodation.  No conjunctivitis or scleral icterus. ENT:  Oropharynx clear without lesion.  Dentures.  Tongue normal. Mucous membranes moist.  RESPIRATORY:  Clear to auscultation without rales, wheezes or rhonchi. CARDIOVASCULAR:  Regular rate and rhythm without murmur, rub or gallop. BREAST:  Right mastectomy.  No erythema or nodularity.  Left breast with inverted nipple.  No masses, skin changes or nipple discharge.  ABDOMEN:  Soft, non-tender, with active bowel sounds, and no hepatosplenomegaly.  No masses. SKIN:  No rashes, ulcers or lesions. EXTREMITIES: No edema, no skin discoloration or  tenderness.  No palpable cords. LYMPH NODES: No palpable cervical, supraclavicular, axillary or inguinal adenopathy  NEUROLOGICAL: Unremarkable. PSYCH:  Appropriate.  Appointment on 12/05/2015  Component Date Value Ref Range Status  . WBC 12/05/2015 6.5  3.6 - 11.0 K/uL Final  . RBC 12/05/2015 3.77* 3.80 - 5.20 MIL/uL Final  . Hemoglobin 12/05/2015 12.1  12.0 - 16.0 g/dL Final  . HCT 12/05/2015 35.8  35.0 - 47.0 % Final  . MCV 12/05/2015 95.1  80.0 - 100.0 fL Final  . MCH 12/05/2015 32.1  26.0 - 34.0 pg Final  . MCHC 12/05/2015 33.7  32.0 - 36.0 g/dL Final  . RDW 12/05/2015 13.9  11.5 - 14.5 % Final  . Platelets 12/05/2015 203  150 - 440 K/uL Final  . Neutrophils Relative % 12/05/2015 63   Final  . Neutro Abs 12/05/2015 4.2  1.4 - 6.5 K/uL Final  . Lymphocytes Relative 12/05/2015 28   Final  . Lymphs Abs 12/05/2015 1.8  1.0 - 3.6 K/uL Final  . Monocytes Relative 12/05/2015 6   Final  . Monocytes Absolute 12/05/2015 0.4  0.2 - 0.9 K/uL Final  . Eosinophils Relative 12/05/2015 2   Final  . Eosinophils Absolute 12/05/2015 0.1  0 - 0.7 K/uL Final  . Basophils Relative 12/05/2015 1   Final  . Basophils Absolute 12/05/2015 0.0  0 - 0.1 K/uL Final  . Sodium 12/05/2015 139  135 - 145 mmol/L Final  . Potassium 12/05/2015 3.5  3.5 - 5.1 mmol/L Final  . Chloride 12/05/2015 103  101 - 111 mmol/L Final  . CO2 12/05/2015 27  22 - 32 mmol/L Final  . Glucose, Bld 12/05/2015 183* 65 - 99 mg/dL Final  . BUN 12/05/2015 14  6 - 20 mg/dL Final  . Creatinine, Ser 12/05/2015 0.67  0.44 - 1.00 mg/dL Final  . Calcium 12/05/2015 8.8* 8.9 - 10.3 mg/dL Final  . Total Protein 12/05/2015 6.9  6.5 - 8.1 g/dL Final  . Albumin 12/05/2015 4.0  3.5 - 5.0 g/dL Final  . AST 12/05/2015 18  15 - 41 U/L Final  . ALT 12/05/2015 12* 14 - 54 U/L Final  . Alkaline Phosphatase 12/05/2015 66  38 - 126 U/L Final  . Total Bilirubin 12/05/2015 0.7  0.3 - 1.2 mg/dL Final  . GFR calc non Af Amer 12/05/2015 >60  >60 mL/min  Final  . GFR calc Af Amer 12/05/2015 >60  >60 mL/min Final   Comment: (NOTE) The eGFR has been calculated using the CKD EPI equation. This calculation has not been validated in all clinical situations. eGFR's persistently <60 mL/min signify possible Chronic Kidney Disease.   Georgiann Hahn gap 12/05/2015 9  5 - 15 Final    Assessment:  Rachael Castro is a 82 y.o. female with a history of breast cancer status post right modified radical mastectomy in 1999 followed by 4 cycles of Adriamycin and Cytoxan. She received tamoxifen then extended adjuvant therapy with Femara.    Left sided mammogram on 07/19/2015 revealed no evidence of malignancy.  She has a history of gastrointestinal stromal tumor (GIST) status post resection on 02/27/2012. Pathology revealed a 6.4 cm tumor. She received a year of [place] beginning 03/2012. Her dose was reduced secondary to side effects.  Abdominal and pelvic CT scan on 04/17/2015 revealed postsurgical changes related to the prior gastric resection. There was no evidence of recurrent or metastatic disease. There were multiple nonobstructing bilateral renal calculi measuring up to 2 mm. Abdominal and pelvic CT scan on 12/03/2015 revealed a stable exam with no evidence of recurrent disease.  There were tiny nonobstructive intrarenal calculi without evidence of hydronephrosis.  Clinically, she feels good. She denies any breast or abdominal symptoms. Exam is unremarkable.  Plan: 1. Labs today:  CBC with diff, CMP, CA27.29. 2. Review interval abdominal/pelvic CT scan. 3. Discuss follow-up mammogram on 07/18/2015 (ordered by Dr. Jamal Collin). 4. Review pathology.  Consider switch to yearly abdomen and pelvic CT. 5. RTC in 6 months for MD assessment, labs (CBC with diff, CMP).   Lequita Asal, MD  12/05/2015, 11:09 AM

## 2015-12-06 ENCOUNTER — Encounter: Payer: Self-pay | Admitting: Hematology and Oncology

## 2015-12-06 LAB — CANCER ANTIGEN 27.29: CA 27.29: 46.2 U/mL — ABNORMAL HIGH (ref 0.0–38.6)

## 2016-01-08 ENCOUNTER — Ambulatory Visit: Payer: Medicare Other | Attending: Pain Medicine | Admitting: Pain Medicine

## 2016-01-08 ENCOUNTER — Encounter: Payer: Self-pay | Admitting: Pain Medicine

## 2016-01-08 VITALS — BP 126/67 | HR 70 | Temp 98.2°F | Resp 18 | Ht 64.0 in | Wt 134.5 lb

## 2016-01-08 DIAGNOSIS — M4802 Spinal stenosis, cervical region: Secondary | ICD-10-CM | POA: Diagnosis not present

## 2016-01-08 DIAGNOSIS — M5030 Other cervical disc degeneration, unspecified cervical region: Secondary | ICD-10-CM | POA: Insufficient documentation

## 2016-01-08 DIAGNOSIS — B0229 Other postherpetic nervous system involvement: Secondary | ICD-10-CM | POA: Insufficient documentation

## 2016-01-08 DIAGNOSIS — M545 Low back pain: Secondary | ICD-10-CM | POA: Diagnosis not present

## 2016-01-08 DIAGNOSIS — M542 Cervicalgia: Secondary | ICD-10-CM | POA: Diagnosis present

## 2016-01-08 DIAGNOSIS — M5136 Other intervertebral disc degeneration, lumbar region: Secondary | ICD-10-CM

## 2016-01-08 DIAGNOSIS — M79604 Pain in right leg: Secondary | ICD-10-CM | POA: Diagnosis present

## 2016-01-08 DIAGNOSIS — M47812 Spondylosis without myelopathy or radiculopathy, cervical region: Secondary | ICD-10-CM | POA: Insufficient documentation

## 2016-01-08 MED ORDER — DULOXETINE HCL 30 MG PO CPEP: ORAL_CAPSULE | ORAL | Status: DC

## 2016-01-08 MED ORDER — GABAPENTIN (ONCE-DAILY) 300 MG PO TABS: ORAL_TABLET | ORAL | Status: DC

## 2016-01-08 MED ORDER — LIDOCAINE 5 % EX PTCH: MEDICATED_PATCH | CUTANEOUS | Status: DC

## 2016-01-08 NOTE — Patient Instructions (Addendum)
PLAN   Continue present medication Cymbalta and Neurontin Take vitamin B complex to help decrease the burning sensation of the right lower extremity . Also as we discussed you should have Lidoderm patch applied to the right lower extremity Please ask nurses to check on your Lidoderm patch prescription  F/U PCP Dr. Lucita Lora for evaliation of  BP and general medical  condition  F/U surgical evaluation. We will avoid at this time  F/U oncology as planned  F/U urologist for nephrolithiasis as discussed  F/U neurological evaluation. May consider pending follow-up evaluations  May consider radiofrequency rhizolysis or intraspinal procedures pending response to present treatment and F/U evaluation . We will avoid such evaluations at this time  Patient to call Pain Management Center should patient have concerns prior to scheduled return appointment.Pain Management Discharge Instructions  General Discharge Instructions :  If you need to reach your doctor call: Monday-Friday 8:00 am - 4:00 pm at [phone] or toll free [phone].  After clinic hours [phone] to have operator reach doctor.  Bring all of your medication bottles to all your appointments in the pain clinic.  To cancel or reschedule your appointment with Pain Management please remember to call 24 hours in advance to avoid a fee.  Refer to the educational materials which you have been given on: General Risks, I had my Procedure. Discharge Instructions, Post Sedation.  Post Procedure Instructions:  The drugs you were given will stay in your system until tomorrow, so for the next 24 hours you should not drive, make any legal decisions or drink any alcoholic beverages.  You may eat anything you prefer, but it is better to start with liquids then soups and crackers, and gradually work up to solid foods.  Please notify your doctor immediately if you have any unusual bleeding, trouble breathing or pain that is not related to  your normal pain.  Depending on the type of procedure that was done, some parts of your body may feel week and/or numb.  This usually clears up by tonight or the next day.  Walk with the use of an assistive device or accompanied by an adult for the 24 hours.  You may use ice on the affected area for the first 24 hours.  Put ice in a Ziploc bag and cover with a towel and place against area 15 minutes on 15 minutes off.  You may switch to heat after 24 hours.

## 2016-01-08 NOTE — Progress Notes (Signed)
Subjective:    Patient ID: Rachael Castro, female    DOB: 1931/05/14, 82 y.o.   MRN: [id number]  HPI  The patient is an 82 year old female who returns to pain management for further evaluation and treatment of pain involving the neck as well as the right lower extremity. The patient denies any significant pain of either region and states that she continues Cymbalta and Neurontin. Previously we prescribed Lidoderm patch for patient's pain of the right lower extremity which was due to to pain secondary to postherpetic neuralgia. We will request insurance approval for the Lidoderm patch once again and we'll prescribe Lidoderm patch for patient's postherpetic neuralgia pain of the right lower extremity lateral aspect. The patient continues Neurontin and Cymbalta which also is beneficial in this regard. The patient is without significant pain of the neck and upper extremity regions following prior interventional treatment of the cervical region. The patient also is with improvement of pain of the lower extremity following interventional treatment for lower extremity pain. We will continue medications as prescribed and we will request approval for Lidoderm patch. The patient is with understanding and agreed to suggested treatment plan.    Review of Systems     Objective:   Physical Exam  There was tenderness to palpation of paraspinal musculature region cervical region cervical facet region a mild degree with mild tenderness of the splenius capitis and occipitalis musculature regions. Palpation over the acromioclavicular and glenohumeral joint regions reproduces minimal discomfort as well. Patient appeared to be with bilaterally equal grip strength and Tinel and Phalen's maneuver were without increase of pain of significant degree. There was no crepitus of the thoracic region and no significant muscle spasm of the thoracic paraspinal musculature region noted Palpation over the lumbar paraspinal must  reason lumbar facet region was without increased pain of significant degree. There was slight tenderness to palpation of the lateral aspect of the right lower extremity distal to the knee in dermatomal distribution of L5-S1 on the right. EHL strength appeared to be slightly decreased on the right compared to the left. Straight leg raise was tolerated to 30 without increased pain with dorsiflexion noted. There was negative clonus negative Homans. DTRs were difficult to elicit patient had difficulty relaxing. There was mild tenderness over the PSIS and PII S regions and will tenderness over the greater trochanteric region and iliotibial band region. Abdomen was nontender with no costovertebral tenderness noted      Assessment & Plan:     Postherpetic neuralgia of the right lower extremity Lumbar and lower extremity region on the right  Degenerative disc disease lumbar spine  Degenerative disc disease cervical spine C3-4 degenerative changes with foraminal stenosis on the left C5-6 stenosis with right C7 stenosis with multilevel degenerative changes noted throughout the cervical spine     PLAN   Continue present medication Cymbalta and Neurontin Take vitamin B complex to help decrease the burning sensation of the right lower extremity . Also as we discussed you should have Lidoderm patch applied to the right lower extremity Please ask nurses to check on your Lidoderm patch prescription  F/U PCP Dr. Lucita Lora for evaliation of  BP and general medical  condition  F/U surgical evaluation. We will avoid at this time  F/U oncology as planned  F/U urologist for nephrolithiasis as discussed  F/U neurological evaluation. May consider pending follow-up evaluations  May consider radiofrequency rhizolysis or intraspinal procedures pending response to present treatment and F/U evaluation . We will avoid such  evaluations at this time  Patient to call Pain Management Center should patient have  concerns prior to scheduled return appointment.

## 2016-01-08 NOTE — Progress Notes (Signed)
Safety precautions to be maintained throughout the outpatient stay will include: orient to surroundings, keep bed in low position, maintain call bell within reach at all times, provide assistance with transfer out of bed and ambulation.  

## 2016-02-14 ENCOUNTER — Encounter: Payer: Self-pay | Admitting: Family Medicine

## 2016-02-21 ENCOUNTER — Ambulatory Visit: Payer: Medicare Other | Admitting: Family Medicine

## 2016-03-21 IMAGING — CT CT ABD-PELV W/O CM
2 of 4 series · 17 of 46 positions shown, 19 images · non-contrast
Comparison: 04/17/2015

CLINICAL DATA: Followup GI stromal tumor. Previous surgical
resection. Personal history of breast carcinoma.

EXAM:
CT ABDOMEN AND PELVIS WITHOUT CONTRAST
TECHNIQUE: Multidetector CT imaging of the abdomen and pelvis was performed
following the standard protocol without IV contrast.

[Series 2: abd pel wo · axial · 0.64mm/px · z∈[-712,-326]mm · 14 of 85 slices shown, 16 images]
[im 4/85  soft-tissue]
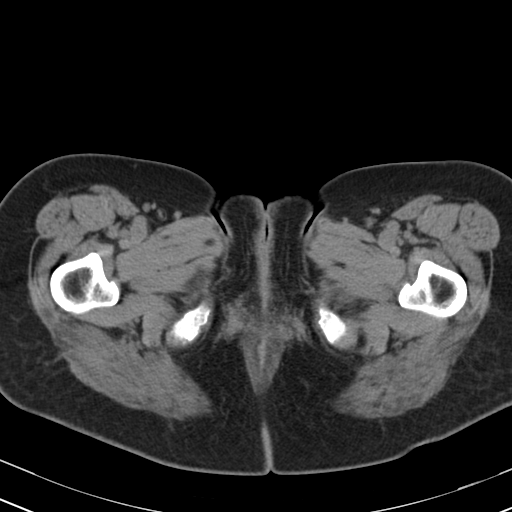
[im 4/85  bone]
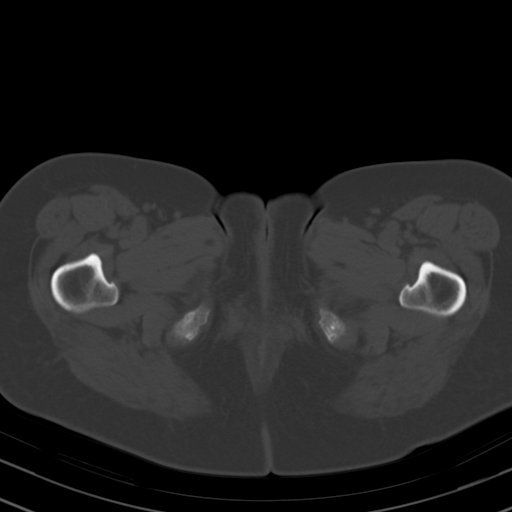
[im 10/85  soft-tissue]
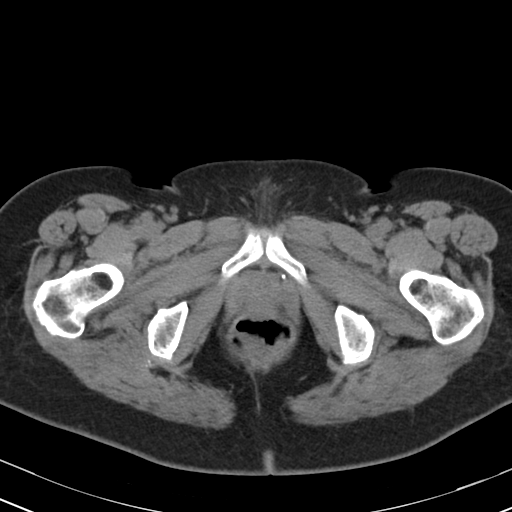
[im 17/85  soft-tissue]
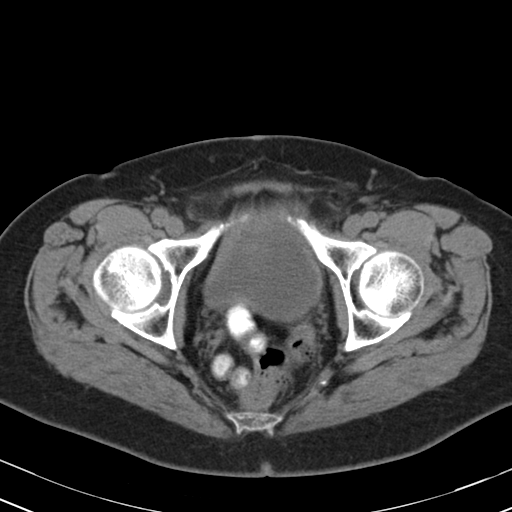
[im 23/85  soft-tissue]
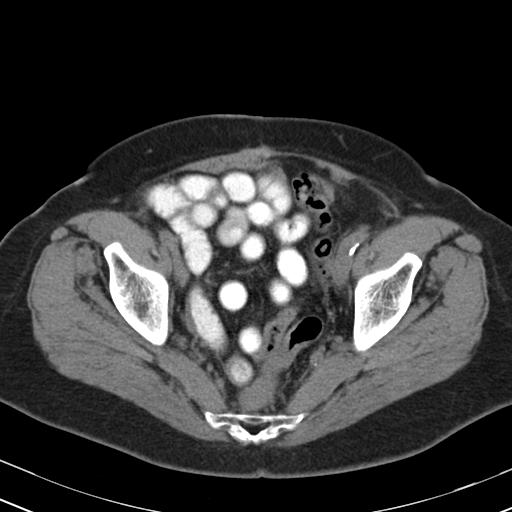
[im 30/85  soft-tissue]
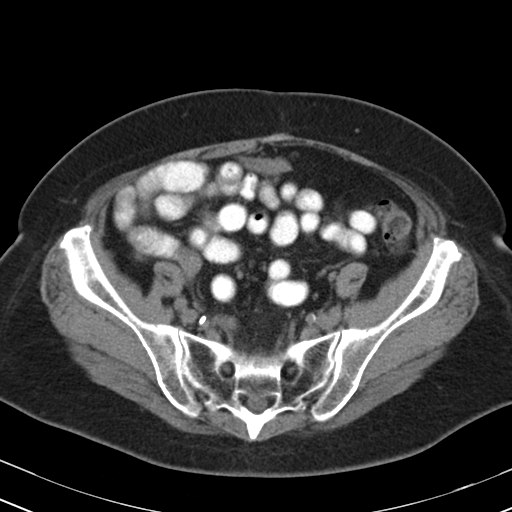
[im 33/85  soft-tissue]
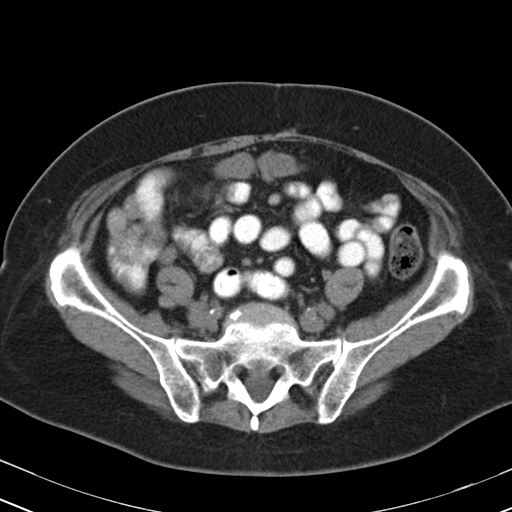
[im 39/85  soft-tissue]
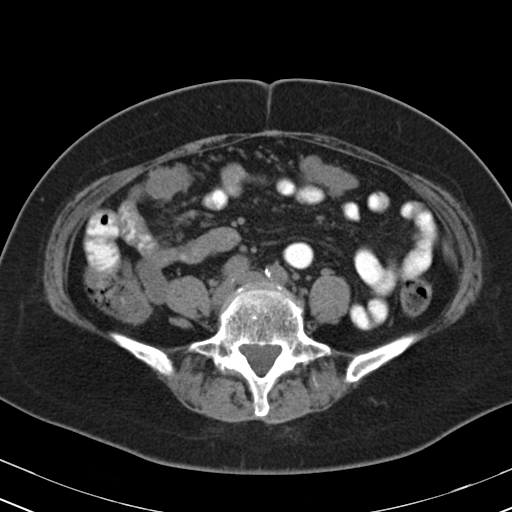
[im 46/85  soft-tissue]
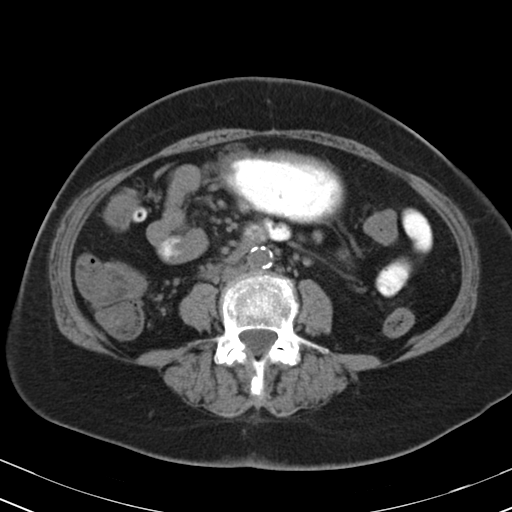
[im 52/85  soft-tissue]
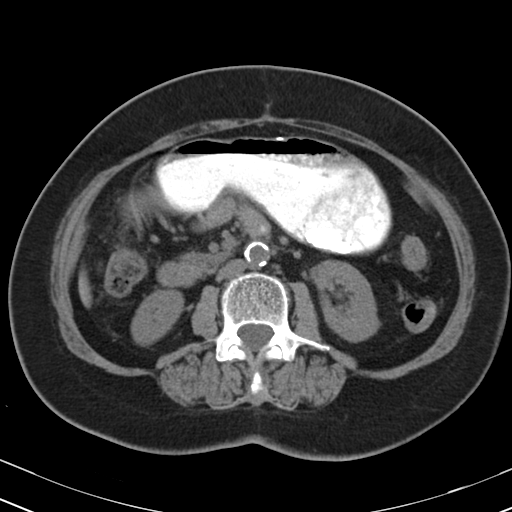
[im 52/85  bone]
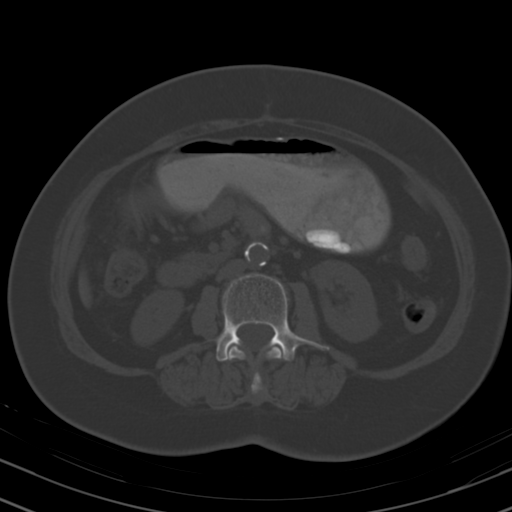
[im 55/85  soft-tissue]
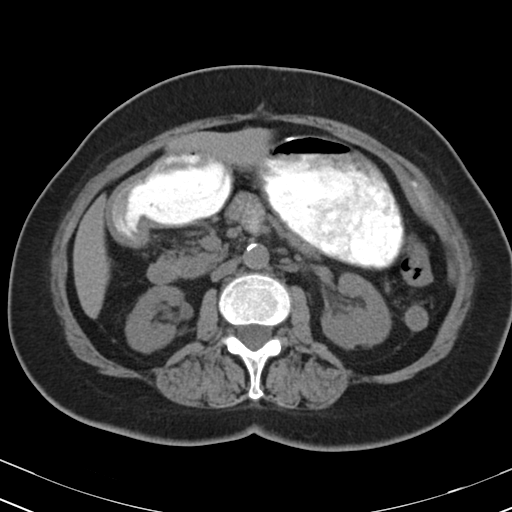
[im 62/85  soft-tissue]
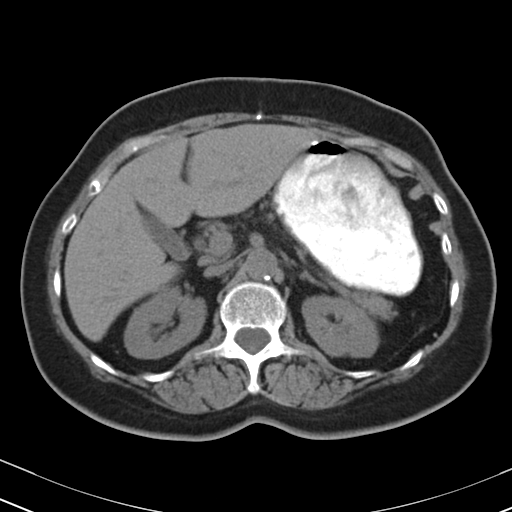
[im 68/85  soft-tissue]
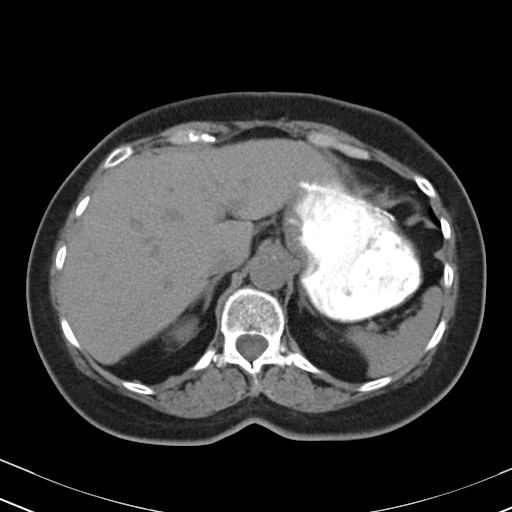
[im 75/85  soft-tissue]
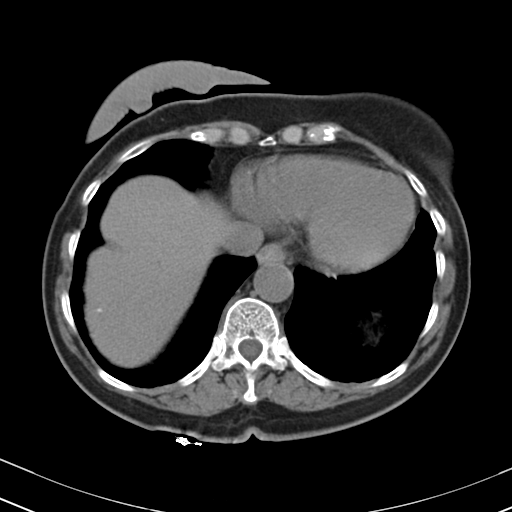
[im 81/85  soft-tissue]
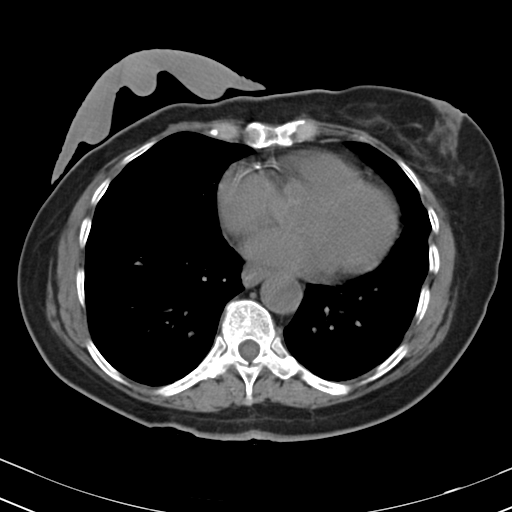

[Series 602: coronal · coronal · 0.83mm/px · 3 of 83 slices shown]
[im 28/83  soft-tissue]
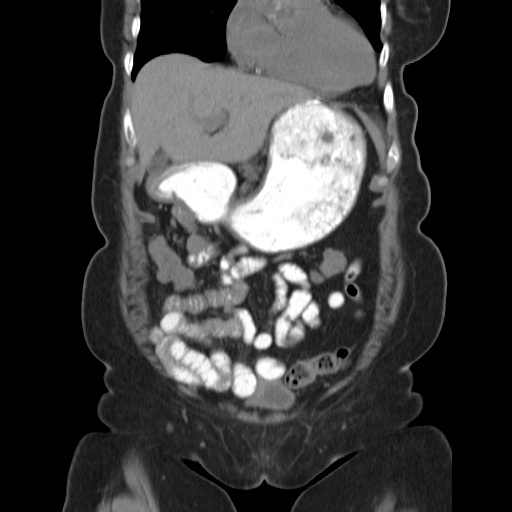
[im 37/83  soft-tissue]
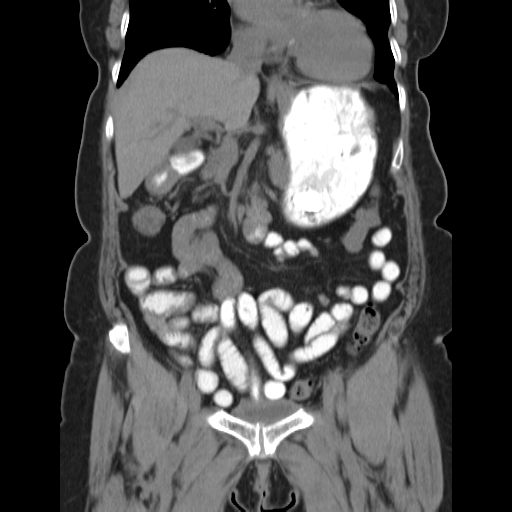
[im 46/83  soft-tissue]
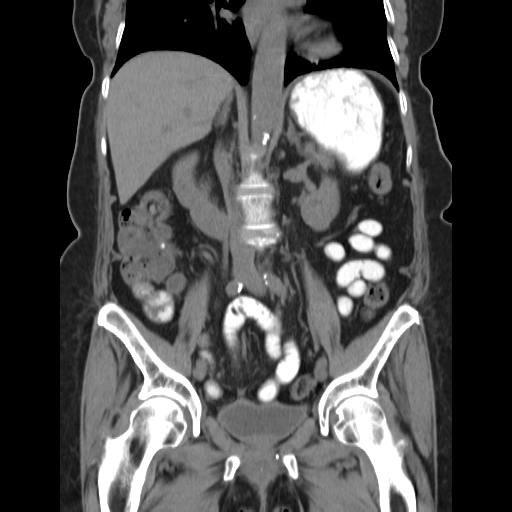

[17 of 46 positions shown; findings below may reference images not displayed]

FINDINGS: Lower chest:  No acute findings.

Hepatobiliary: No mass visualized on this un-enhanced exam.
Gallbladder is unremarkable.

Pancreas: No mass or inflammatory process identified on this
un-enhanced exam.

Spleen: Within normal limits in size.

Adrenals/Urinary Tract: Several tiny 1-2 mm nonobstructive renal
calculi are again seen bilaterally. No evidence of hydronephrosis.
No evidence of ureteral or bladder calculi.

Stomach/Bowel: Surgical anastomotic staples are again seen along the
anterior wall the stomach, however there is no evidence of recurrent
soft tissue mass. No evidence of obstruction, inflammatory process,
or abnormal fluid collections. Mild colonic diverticulosis is noted,
without evidence of diverticulitis.

Vascular/Lymphatic: No pathologically enlarged lymph nodes. No
evidence of abdominal aortic aneurysm.

Reproductive: Prior hysterectomy noted. Adnexal regions are
unremarkable in appearance.

Other: None.

Musculoskeletal:  No suspicious bone lesions identified.
IMPRESSION: Stable exam. No evidence of recurrent or metastatic tumor within the
abdomen or pelvis.

Tiny bilateral nonobstructive intrarenal calculi. No evidence of
ureteral calculi or hydronephrosis.

## 2016-04-06 ENCOUNTER — Other Ambulatory Visit: Payer: Self-pay | Admitting: Family Medicine

## 2016-04-08 ENCOUNTER — Encounter: Payer: Self-pay | Admitting: Pain Medicine

## 2016-04-08 ENCOUNTER — Ambulatory Visit: Payer: Medicare Other | Attending: Pain Medicine | Admitting: Pain Medicine

## 2016-04-08 VITALS — BP 147/65 | HR 65 | Temp 97.9°F | Resp 16 | Ht 64.0 in | Wt 131.0 lb

## 2016-04-08 DIAGNOSIS — M5136 Other intervertebral disc degeneration, lumbar region: Secondary | ICD-10-CM | POA: Insufficient documentation

## 2016-04-08 DIAGNOSIS — M5030 Other cervical disc degeneration, unspecified cervical region: Secondary | ICD-10-CM | POA: Diagnosis not present

## 2016-04-08 DIAGNOSIS — M545 Low back pain: Secondary | ICD-10-CM | POA: Diagnosis present

## 2016-04-08 DIAGNOSIS — B0229 Other postherpetic nervous system involvement: Secondary | ICD-10-CM | POA: Diagnosis not present

## 2016-04-08 DIAGNOSIS — M4802 Spinal stenosis, cervical region: Secondary | ICD-10-CM | POA: Diagnosis not present

## 2016-04-08 MED ORDER — LIDOCAINE 5 % EX PTCH: MEDICATED_PATCH | CUTANEOUS | Status: DC

## 2016-04-08 MED ORDER — GABAPENTIN (ONCE-DAILY) 300 MG PO TABS: ORAL_TABLET | ORAL | Status: DC

## 2016-04-08 MED ORDER — DULOXETINE HCL 30 MG PO CPEP: ORAL_CAPSULE | ORAL | Status: DC

## 2016-04-08 NOTE — Progress Notes (Signed)
Subjective:    Patient ID: Rachael Castro, female    DOB: 11-27-1930, 82 y.o.   MRN: [id number]  HPI  The patient is an 82 year old female who returns to pain management for further evaluation and treatment of pain involving the lower back lower extremity region with prior history of shingles involving the right lower extremity. The patient states that she is without any burning stinging sensations of the right lower extremity and that the shingles pain is well controlled. The patient also is a history of pain involving the cervical and upper extremity region. The patient states that she recently cut the grass and that she has had some pain involving the left side of the neck. We discussed patient's condition and informed patient that we will remain available to consider interventional treatment should pain involving the left side of the neck and upper back region persist. The patient denied any definite radiation of pain from the cervical region to the upper extremity region on today's visit. We will continue medications consisting of Cymbalta  Neurontin Lidoderm patch and vitamin B complex and will consider patient for interventional treatment as well as additional modifications of treatment regimen pending follow-up evaluation. The patient will call pain management prior to scheduled return appointment should they be change in condition or should patient have significant surgery regarding condition. All agreed to suggested treatment plan.  Review of Systems     Objective:   Physical Exam  There was minimal tenderness to palpation of the paraspinal muscular region cervical region cervical facet region on the right. There was moderate tenderness to palpation of muscle spasm of the cervical facet cervical paraspinal musculature region of the left. Palpation of the splenius capitis and occipitalis region reproduces moderate discomfort on the left compared to the right. There appeared to be  unremarkable Spurling's maneuver. The patient appeared to be with bilaterally equal grip strength. Tinel and Phalen's maneuver were without increase of pain of significant degree. Palpation of the acromioclavicular and glenohumeral joint regions reproduce moderate discomfort on the left and mild discomfort on the right. Palpation over the thoracic region was with no crepitus of the thoracic region noted. Palpation over the lumbar paraspinal muscular region lumbar facet region was with mild tenderness to palpation with lateral bending rotation extension and palpation over the lumbar facets reproducing mild discomfort. Straight leg raise was tolerates approximately 30 without increased pain with dorsiflexion noted. There was minimal tenderness to palpation of the right lower extremity with no allodynia of the right lower extremity. There was no evidence of new lesions of the right lower extremity and EHL strength appeared to be equal to the left lower extremity. There was no allodynia of the left lower extremity of the right lower extremity noted. The patient is status post shingles involving the right lower extremity. There was negative clonus negative Homans. Abdomen nontender with no costovertebral angle tenderness noted.      Assessment & Plan:      Postherpetic neuralgia of the right lower extremity Lumbar and lower extremity region on the right  Degenerative disc disease lumbar spine  Degenerative disc disease cervical spine C3-4 degenerative changes with foraminal stenosis on the left C5-6 stenosis with right C7 stenosis with multilevel degenerative changes noted throughout the cervical spine     PLAN   Continue present medication Cymbalta and Neurontin Take vitamin B complex to help decrease the burning sensation of the right lower extremity . Apply Lidoderm patch to the painful areas of the skin  as discussed as well  F/U PCP Dr. Lucita Lora for evaliation of  BP and general medical   condition  F/U surgical evaluation. We will avoid at this time  F/U oncology as planned  F/U urologist for nephrolithiasis as needed  F/U neurological evaluation. May consider pending follow-up evaluations. We will avoid additional neurological studies at this time  May consider radiofrequency rhizolysis or intraspinal procedures pending response to present treatment and F/U evaluation . We will avoid such evaluations at this time  Patient to call Pain Management Center should patient have concerns prior to scheduled return appointment.

## 2016-04-08 NOTE — Progress Notes (Signed)
Safety precautions to be maintained throughout the outpatient stay will include: orient to surroundings, keep bed in low position, maintain call bell within reach at all times, provide assistance with transfer out of bed and ambulation.  

## 2016-04-08 NOTE — Patient Instructions (Addendum)
PLAN   Continue present medication Cymbalta and Neurontin Take vitamin B complex to help decrease the burning sensation of the right lower extremity . Apply Lidoderm patch to the painful areas of the skin as discussed as well  F/U PCP Dr. Lucita Lora for evaliation of  BP and general medical  condition  F/U surgical evaluation. We will avoid at this time  F/U oncology as planned  F/U urologist for nephrolithiasis as needed  F/U neurological evaluation. May consider pending follow-up evaluations. We will avoid additional neurological studies at this time  May consider radiofrequency rhizolysis or intraspinal procedures pending response to present treatment and F/U evaluation . We will avoid such evaluations at this time  Patient to call [facility] Management Center should patient have concerns prior to scheduled return appointment.Pain Management Discharge Instructions  General Discharge Instructions :  If you need to reach your doctor call: Monday-Friday 8:00 am - 4:00 pm at [phone] or toll free [phone].  After clinic hours [phone] to have operator reach doctor.  Bring all of your medication bottles to all your appointments in the pain clinic.  To cancel or reschedule your appointment with Pain Management please remember to call 24 hours in advance to avoid a fee.  Refer to the educational materials which you have been given on: General Risks, I had my Procedure. Discharge Instructions, Post Sedation.  Post Procedure Instructions:  The drugs you were given will stay in your system until tomorrow, so for the next 24 hours you should not drive, make any legal decisions or drink any alcoholic beverages.  You may eat anything you prefer, but it is better to start with liquids then soups and crackers, and gradually work up to solid foods.  Please notify your doctor immediately if you have any unusual bleeding, trouble breathing or pain that is not related to your normal  pain.  Depending on the type of procedure that was done, some parts of your body may feel week and/or numb.  This usually clears up by tonight or the next day.  Walk with the use of an assistive device or accompanied by an adult for the 24 hours.  You may use ice on the affected area for the first 24 hours.  Put ice in a Ziploc bag and cover with a towel and place against area 15 minutes on 15 minutes off.  You may switch to heat after 24 hours.

## 2016-04-09 ENCOUNTER — Ambulatory Visit: Payer: Medicare Other | Admitting: Pain Medicine

## 2016-04-29 ENCOUNTER — Ambulatory Visit (INDEPENDENT_AMBULATORY_CARE_PROVIDER_SITE_OTHER): Payer: Medicare Other | Admitting: Family Medicine

## 2016-04-29 ENCOUNTER — Encounter: Payer: Self-pay | Admitting: Family Medicine

## 2016-04-29 VITALS — BP 138/60 | HR 76 | Temp 98.7°F | Resp 14 | Wt 133.0 lb

## 2016-04-29 DIAGNOSIS — M5030 Other cervical disc degeneration, unspecified cervical region: Secondary | ICD-10-CM

## 2016-04-29 DIAGNOSIS — E1169 Type 2 diabetes mellitus with other specified complication: Secondary | ICD-10-CM

## 2016-04-29 DIAGNOSIS — Z5181 Encounter for therapeutic drug level monitoring: Secondary | ICD-10-CM | POA: Diagnosis not present

## 2016-04-29 DIAGNOSIS — E785 Hyperlipidemia, unspecified: Secondary | ICD-10-CM

## 2016-04-29 DIAGNOSIS — B0229 Other postherpetic nervous system involvement: Secondary | ICD-10-CM

## 2016-04-29 DIAGNOSIS — M5136 Other intervertebral disc degeneration, lumbar region: Secondary | ICD-10-CM

## 2016-04-29 DIAGNOSIS — Z853 Personal history of malignant neoplasm of breast: Secondary | ICD-10-CM | POA: Diagnosis not present

## 2016-04-29 MED ORDER — LOSARTAN POTASSIUM 25 MG PO TABS: 25.0000 mg | ORAL_TABLET | Freq: Every day | ORAL | Status: DC

## 2016-04-29 MED ORDER — ATORVASTATIN CALCIUM 40 MG PO TABS: 40.0000 mg | ORAL_TABLET | Freq: Every day | ORAL | Status: DC

## 2016-04-29 MED ORDER — OMEPRAZOLE 40 MG PO CPDR: 40.0000 mg | DELAYED_RELEASE_CAPSULE | Freq: Every day | ORAL | Status: DC

## 2016-04-29 NOTE — Progress Notes (Signed)
BP 138/60 mmHg  Pulse 76  Temp(Src) 98.7 F (37.1 C) (Oral)  Resp 14  Wt 133 lb (60.328 kg)  SpO2 97%   Subjective:    Patient ID: Rachael Castro, female    DOB: 1931/02/01, 82 y.o.   MRN: [id number]  HPI: Rachael Castro is a 82 y.o. female  Chief Complaint  Patient presents with  . Medication Refill   Patient is new to me; her previous provider is unavailable  Type 2 diabetes; diagnosed around 2013; Dr. Rutherford Nail stopped her pills, blood sugar was doing so well; she tries to watch a good diet; last eye exam was in June; no problems; cataract removed in 2016, another cataract removed prior; does have pain in her right leg; had shingles and has PHN Last A1c was 7.1 in January  High cholesterol; on statin; no muscle aches; trying to eat fewer saturated fats; does like bacon; does not eat much cheese; last cholesterol was August 2016; LDL 136  GERD; on PPI; had something removed in 2013; not much acid on the medicine, just every now and then; has to be careful with raw foods; no blood in the stool  Hx of breast cancer; right breast; no fam hx; followed by Dr. Mike Gip at the cancer center  Had thyroid nodule checked out year ago; followed by Dr. Jamal Collin  Depression screen [facility] 2/9 04/29/2016 04/08/2016 01/08/2016 10/23/2015 10/11/2015  Decreased Interest 0 0 0 0 0  Down, Depressed, Hopeless 0 0 0 0 0  PHQ - 2 Score 0 0 0 0 0   Relevant past medical, surgical, family and social history reviewed Past Medical History  Diagnosis Date  . Hypertension   . Diabetes mellitus   . GERD (gastroesophageal reflux disease)   . Neuromuscular disorder (HCC)     hx shingles  . Hypercholesteremia   . Personal history of colonic polyps   . Thyroid disease   . b 1999    breast cancer  . 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 Kidney stones    Past Surgical History  Procedure Laterality Date  . Mastectomy    . Abdominal hysterectomy    . Tubal ligation     . Tonsillectomy    . Appendectomy    . Eus  02/12/2012    Procedure: UPPER ENDOSCOPIC ULTRASOUND (EUS) LINEAR;  Surgeon: Milus Banister, MD;  Location: [facility];  Service: Endoscopy;  Laterality: N/A;  radial linear  . Hernia repair  2012  . Salpingoophorectomy Right 1979  . Thyroidectomy  1978  . Breast surgery Right 1999    mastectomy  . Laparotomy  2013    excision of gastric wall mass   . Eye surgery Right 2013    cataract  . Upper gi endoscopy  2013  . Colonoscopy  2008    Sankar  . Cataract extraction w/phaco Left 04/30/2015    Procedure: CATARACT EXTRACTION PHACO AND INTRAOCULAR LENS PLACEMENT (IOC);  Surgeon: Estill Cotta, MD;  Location: [facility] ORS;  Service: Ophthalmology;  Laterality: Left;  [place] 01:39 AP% 23.3 CDE 40.42 fluid pack lot # [id number]   Family History  Problem Relation Age of Onset  . Cancer Mother   . Cancer Father    Social History  Substance Use Topics  . Smoking status: Never Smoker   . Smokeless tobacco: Never Used  . Alcohol Use: No   Interim medical history since last visit reviewed. Allergies and medications reviewed  Review of Systems Per HPI unless specifically indicated above     Objective:    BP 138/60 mmHg  Pulse 76  Temp(Src) 98.7 F (37.1 C) (Oral)  Resp 14  Wt 133 lb (60.328 kg)  SpO2 97%  Wt Readings from Last 3 Encounters:  04/29/16 133 lb (60.328 kg)  04/08/16 131 lb (59.421 kg)  01/08/16 134 lb 8 oz (61.009 kg)    Physical Exam  Constitutional: She appears well-developed and well-nourished. No distress.  She appears a decade younger than her stated age  HENT:  Head: Normocephalic and atraumatic.  Eyes: EOM are normal. No scleral icterus.  Neck: No thyromegaly present.  Cardiovascular: Normal rate, regular rhythm and normal heart sounds.   No murmur heard. Pulmonary/Chest: Effort normal and breath sounds normal. No respiratory distress. She has no wheezes.  Abdominal: Soft. Bowel sounds are normal. She  exhibits no distension.  Musculoskeletal: Normal range of motion. She exhibits no edema.  Neurological: She is alert. She exhibits normal muscle tone.  Skin: Skin is warm and dry. She is not diaphoretic. No pallor.  Psychiatric: She has a normal mood and affect. Her behavior is normal. Judgment and thought content normal.   Diabetic Foot Form - Detailed   Diabetic Foot Exam - detailed  Diabetic Foot exam was performed with the following findings:  Yes 04/29/2016  2:38 PM  Visual Foot Exam completed.:  Yes  Are the toenails long?:  No  Are the toenails thick?:  No  Are the toenails ingrown?:  No  Normal Range of Motion:  Yes    Pulse Foot Exam completed.:  Yes  Right Dorsalis Pedis:  Present Left Dorsalis Pedis:  Present  Sensory Foot Exam Completed.:  Yes  Swelling:  No  Semmes-Weinstein Monofilament Test  R Site 1-Great Toe:  Pos L Site 1-Great Toe:  Pos  R Site 4:  Pos L Site 4:  Pos  R Site 5:  Pos L Site 5:  Pos        Results for orders placed or performed in visit on 12/05/15  CBC with Differential  Result Value Ref Range   WBC 6.5 3.6 - 11.0 K/uL   RBC 3.77 (L) 3.80 - 5.20 MIL/uL   Hemoglobin 12.1 12.0 - 16.0 g/dL   HCT 35.8 35.0 - 47.0 %   MCV 95.1 80.0 - 100.0 fL   MCH 32.1 26.0 - 34.0 pg   MCHC 33.7 32.0 - 36.0 g/dL   RDW 13.9 11.5 - 14.5 %   Platelets 203 150 - 440 K/uL   Neutrophils Relative % 63 %   Neutro Abs 4.2 1.4 - 6.5 K/uL   Lymphocytes Relative 28 %   Lymphs Abs 1.8 1.0 - 3.6 K/uL   Monocytes Relative 6 %   Monocytes Absolute 0.4 0.2 - 0.9 K/uL   Eosinophils Relative 2 %   Eosinophils Absolute 0.1 0 - 0.7 K/uL   Basophils Relative 1 %   Basophils Absolute 0.0 0 - 0.1 K/uL  Comprehensive metabolic panel  Result Value Ref Range   Sodium 139 135 - 145 mmol/L   Potassium 3.5 3.5 - 5.1 mmol/L   Chloride 103 101 - 111 mmol/L   CO2 27 22 - 32 mmol/L   Glucose, Bld 183 (H) 65 - 99 mg/dL   BUN 14 6 - 20 mg/dL   Creatinine, Ser 0.67 0.44 - 1.00 mg/dL     Calcium 8.8 (L) 8.9 - 10.3 mg/dL   Total Protein 6.9 6.5 -  8.1 g/dL   Albumin 4.0 3.5 - 5.0 g/dL   AST 18 15 - 41 U/L   ALT 12 (L) 14 - 54 U/L   Alkaline Phosphatase 66 38 - 126 U/L   Total Bilirubin 0.7 0.3 - 1.2 mg/dL   GFR calc non Af Amer >60 >60 mL/min   GFR calc Af Amer >60 >60 mL/min   Anion gap 9 5 - 15  Cancer antigen 27.29  Result Value Ref Range   CA 27.29 46.2 (H) 0.0 - 38.6 U/mL      Assessment & Plan:   Problem List Items Addressed This Visit      Endocrine   Type 2 diabetes mellitus ([place]) - Primary    Reviewed last A1c, excellent control for age; foot exam by MD today; check urine microalbumin:creatinine      Relevant Medications   atorvastatin (LIPITOR) 40 MG tablet   losartan (COZAAR) 25 MG tablet   Other Relevant Orders   Lipid panel   Hemoglobin A1c   Microalbumin / creatinine urine ratio     Nervous and Auditory   Postherpetic neuralgia    Managed by Dr. Primus Bravo; sees him every 3 months        Musculoskeletal and Integument   DDD (degenerative disc disease), lumbar    Managed by Dr. Primus Bravo      DDD (degenerative disc disease), cervical    Managed by Dr. Primus Bravo        Other   Personal history of malignant neoplasm of breast    Managed by Dr. Mike Gip and Dr. Jamal Collin      Medication monitoring encounter   Relevant Orders   Comprehensive metabolic panel   Hyperlipidemia    Check lipids today; on statin; limit saturated fats      Relevant Medications   atorvastatin (LIPITOR) 40 MG tablet   losartan (COZAAR) 25 MG tablet   Other Relevant Orders   Lipid panel      Follow up plan: Return in about 4 months (around 08/30/2016) for follow-up for diabetes; soon for Medicare wellness visit.  An after-visit summary was printed and given to the patient at [place].  Please see the patient instructions which may contain other information and recommendations beyond what is mentioned above in the assessment and plan.  Meds ordered this  encounter  Medications  . atorvastatin (LIPITOR) 40 MG tablet    Sig: Take 1 tablet (40 mg total) by mouth at bedtime.    Dispense:  30 tablet    Refill:  6  . losartan (COZAAR) 25 MG tablet    Sig: Take 1 tablet (25 mg total) by mouth daily.    Dispense:  30 tablet    Refill:  6  . omeprazole (PRILOSEC) 40 MG capsule    Sig: Take 1 capsule (40 mg total) by mouth daily.    Dispense:  30 capsule    Refill:  5    Orders Placed This Encounter  Procedures  . Lipid panel  . Hemoglobin A1c  . Microalbumin / creatinine urine ratio  . Comprehensive metabolic panel

## 2016-04-29 NOTE — Patient Instructions (Addendum)
Please do see your eye doctor regularly, and have your eyes examined every year (or more often per his or her recommendation) Check your feet every night and let me know right away of any sores, infections, numbness, etc. Try to limit sweets, white bread, white rice, white potatoes It is okay with me for you to not check your fingerstick blood sugars (per [employer] of Endocrinology Best Practices), unless you are interested and feel it would be helpful for you Try to limit saturated fats in your diet (bologna, hot dogs, barbeque, cheeseburgers, hamburgers, steak, bacon, sausage, cheese, etc.) and get more fresh fruits, vegetables, and whole grains Your goal blood pressure is less than 140 mmHg on top. Try to follow the DASH guidelines (DASH stands for Dietary Approaches to Stop Hypertension) Try to limit the sodium in your diet.  Ideally, consume less than 1.5 grams (less than 1,500mg ) per day. Do not add salt when cooking or at the table.  Check the sodium amount on labels when shopping, and choose items lower in sodium when given a choice. Avoid or limit foods that already contain a lot of sodium. Eat a diet rich in fruits and vegetables and whole grains.

## 2016-04-29 NOTE — Assessment & Plan Note (Signed)
Check lipids today; on statin; limit saturated fats

## 2016-04-29 NOTE — Assessment & Plan Note (Signed)
Managed by Dr. Primus Bravo; sees him every 3 months

## 2016-04-29 NOTE — Assessment & Plan Note (Signed)
Managed by Dr. Crisp 

## 2016-04-29 NOTE — Assessment & Plan Note (Signed)
Reviewed last A1c, excellent control for age; foot exam by MD today; check urine microalbumin:creatinine

## 2016-04-29 NOTE — Assessment & Plan Note (Signed)
Managed by Dr. Mike Gip and Dr. Jamal Collin

## 2016-04-30 LAB — LIPID PANEL
Cholesterol: 213 mg/dL — ABNORMAL HIGH (ref 125–200)
HDL: 49 mg/dL (ref >=46)
LDL CALC: 129 mg/dL (ref <130)
TRIGLYCERIDES: 176 mg/dL — AB (ref <150)
Total CHOL/HDL Ratio: 4.3 Ratio (ref <=5.0)
VLDL: 35 mg/dL — ABNORMAL HIGH (ref <30)

## 2016-04-30 LAB — COMPREHENSIVE METABOLIC PANEL
ALT: 12 U/L (ref 6–29)
AST: 14 U/L (ref 10–35)
Albumin: 3.9 g/dL (ref 3.6–5.1)
Alkaline Phosphatase: 67 U/L (ref 33–130)
BUN: 16 mg/dL (ref 7–25)
CALCIUM: 9.2 mg/dL (ref 8.6–10.4)
CHLORIDE: 104 mmol/L (ref 98–110)
CO2: 24 mmol/L (ref 20–31)
Creat: 0.73 mg/dL (ref 0.60–0.88)
GLUCOSE: 176 mg/dL — AB (ref 65–99)
POTASSIUM: 4 mmol/L (ref 3.5–5.3)
Sodium: 140 mmol/L (ref 135–146)
Total Bilirubin: 0.4 mg/dL (ref 0.2–1.2)
Total Protein: 6.5 g/dL (ref 6.1–8.1)

## 2016-04-30 LAB — MICROALBUMIN / CREATININE URINE RATIO
CREATININE, URINE: 152 mg/dL (ref 20–320)
MICROALB UR: 1.4 mg/dL
MICROALB/CREAT RATIO: 9 ug/mg{creat} (ref <30)

## 2016-04-30 LAB — HEMOGLOBIN A1C
HEMOGLOBIN A1C: 9.1 % — AB (ref <5.7)
Mean Plasma Glucose: 214 mg/dL

## 2016-05-08 ENCOUNTER — Telehealth: Payer: Self-pay | Admitting: Family Medicine

## 2016-05-08 NOTE — Telephone Encounter (Signed)
A1c has gone up; call pt with labs

## 2016-05-18 MED ORDER — SITAGLIPTIN PHOS-METFORMIN HCL 50-500 MG PO TABS: ORAL_TABLET | ORAL | 0 refills | Status: DC

## 2016-05-18 NOTE — Telephone Encounter (Signed)
I talked with patient about her labs; she used to be on metformin but doctor stopped that, just wanted to switch things up she says; switched her to [place]; then he stopped that b/c her sugar was doing so well; explained A1c now more than 2 points above target; start back on metformin plus [place]; she had some GI sx from metformin, so I'll ease her back into it, one pill daily for two weeks, then BID

## 2016-05-28 ENCOUNTER — Other Ambulatory Visit: Payer: Self-pay | Admitting: Hematology and Oncology

## 2016-05-28 DIAGNOSIS — Z853 Personal history of malignant neoplasm of breast: Secondary | ICD-10-CM

## 2016-05-28 DIAGNOSIS — Z8509 Personal history of malignant neoplasm of other digestive organs: Secondary | ICD-10-CM

## 2016-06-04 ENCOUNTER — Telehealth: Payer: Self-pay

## 2016-06-04 ENCOUNTER — Inpatient Hospital Stay: Payer: Medicare Other | Attending: Hematology and Oncology

## 2016-06-04 ENCOUNTER — Other Ambulatory Visit: Payer: Self-pay

## 2016-06-04 ENCOUNTER — Inpatient Hospital Stay (HOSPITAL_BASED_OUTPATIENT_CLINIC_OR_DEPARTMENT_OTHER): Payer: Medicare Other | Admitting: Hematology and Oncology

## 2016-06-04 ENCOUNTER — Other Ambulatory Visit (HOSPITAL_COMMUNITY): Payer: Self-pay

## 2016-06-04 ENCOUNTER — Ambulatory Visit: Admission: EM | Admit: 2016-06-04 | Discharge: 2016-06-04 | Payer: Medicare Other

## 2016-06-04 ENCOUNTER — Emergency Department
Admission: EM | Admit: 2016-06-04 | Discharge: 2016-06-04 | Disposition: A | Discharge status: Home / Self Care | Payer: Medicare Other | Attending: Emergency Medicine | Admitting: Emergency Medicine

## 2016-06-04 ENCOUNTER — Encounter: Payer: Self-pay | Admitting: Hematology and Oncology

## 2016-06-04 ENCOUNTER — Encounter: Payer: Self-pay | Admitting: *Deleted

## 2016-06-04 VITALS — BP 155/57 | HR 37 | Temp 97.4°F | Resp 18 | Wt 134.9 lb

## 2016-06-04 DIAGNOSIS — Z7982 Long term (current) use of aspirin: Secondary | ICD-10-CM | POA: Insufficient documentation

## 2016-06-04 DIAGNOSIS — K219 Gastro-esophageal reflux disease without esophagitis: Secondary | ICD-10-CM | POA: Insufficient documentation

## 2016-06-04 DIAGNOSIS — I10 Essential (primary) hypertension: Secondary | ICD-10-CM | POA: Insufficient documentation

## 2016-06-04 DIAGNOSIS — Z79899 Other long term (current) drug therapy: Secondary | ICD-10-CM | POA: Insufficient documentation

## 2016-06-04 DIAGNOSIS — I499 Cardiac arrhythmia, unspecified: Secondary | ICD-10-CM

## 2016-06-04 DIAGNOSIS — Z9011 Acquired absence of right breast and nipple: Secondary | ICD-10-CM | POA: Diagnosis not present

## 2016-06-04 DIAGNOSIS — R008 Other abnormalities of heart beat: Secondary | ICD-10-CM | POA: Insufficient documentation

## 2016-06-04 DIAGNOSIS — Z8509 Personal history of malignant neoplasm of other digestive organs: Secondary | ICD-10-CM

## 2016-06-04 DIAGNOSIS — Z85028 Personal history of other malignant neoplasm of stomach: Secondary | ICD-10-CM | POA: Insufficient documentation

## 2016-06-04 DIAGNOSIS — Z853 Personal history of malignant neoplasm of breast: Secondary | ICD-10-CM | POA: Insufficient documentation

## 2016-06-04 DIAGNOSIS — R001 Bradycardia, unspecified: Secondary | ICD-10-CM

## 2016-06-04 DIAGNOSIS — Z9221 Personal history of antineoplastic chemotherapy: Secondary | ICD-10-CM | POA: Diagnosis not present

## 2016-06-04 DIAGNOSIS — E119 Type 2 diabetes mellitus without complications: Secondary | ICD-10-CM | POA: Diagnosis not present

## 2016-06-04 DIAGNOSIS — Z9223 Personal history of estrogen therapy: Secondary | ICD-10-CM

## 2016-06-04 DIAGNOSIS — Z170 Estrogen receptor positive status [ER+]: Secondary | ICD-10-CM | POA: Insufficient documentation

## 2016-06-04 DIAGNOSIS — I498 Other specified cardiac arrhythmias: Secondary | ICD-10-CM

## 2016-06-04 LAB — COMPREHENSIVE METABOLIC PANEL
ALT: 13 U/L — ABNORMAL LOW (ref 14–54)
AST: 18 U/L (ref 15–41)
Albumin: 3.9 g/dL (ref 3.5–5.0)
Alkaline Phosphatase: 66 U/L (ref 38–126)
Anion gap: 7 (ref 5–15)
BUN: 14 mg/dL (ref 6–20)
CO2: 28 mmol/L (ref 22–32)
Calcium: 9 mg/dL (ref 8.9–10.3)
Chloride: 104 mmol/L (ref 101–111)
Creatinine, Ser: 0.67 mg/dL (ref 0.44–1.00)
GFR calc Af Amer: >60 mL/min (ref >60)
GFR calc non Af Amer: >60 mL/min (ref >60)
Glucose, Bld: 128 mg/dL — ABNORMAL HIGH (ref 65–99)
Potassium: 3.8 mmol/L (ref 3.5–5.1)
Sodium: 139 mmol/L (ref 135–145)
Total Bilirubin: 0.6 mg/dL (ref 0.3–1.2)
Total Protein: 7 g/dL (ref 6.5–8.1)

## 2016-06-04 LAB — CBC WITH DIFFERENTIAL/PLATELET
BASOS ABS: 0 10*3/uL (ref 0–0.1)
BASOS PCT: 1 %
Basophils Absolute: 0 10*3/uL (ref 0–0.1)
Basophils Relative: 1 %
Eosinophils Absolute: 0.2 10*3/uL (ref 0–0.7)
Eosinophils Absolute: 0.1 10*3/uL (ref 0–0.7)
Eosinophils Relative: 2 %
Eosinophils Relative: 1 %
HCT: 35.9 % (ref 35.0–47.0)
HEMATOCRIT: 37.9 % (ref 35.0–47.0)
HEMOGLOBIN: 12.7 g/dL (ref 12.0–16.0)
Hemoglobin: 12.1 g/dL (ref 12.0–16.0)
LYMPHS PCT: 32 %
Lymphocytes Relative: 33 %
Lymphs Abs: 2.5 10*3/uL (ref 1.0–3.6)
Lymphs Abs: 2.4 10*3/uL (ref 1.0–3.6)
MCH: 32.2 pg (ref 26.0–34.0)
MCH: 32.2 pg (ref 26.0–34.0)
MCHC: 33.6 g/dL (ref 32.0–36.0)
MCHC: 33.6 g/dL (ref 32.0–36.0)
MCV: 95.7 fL (ref 80.0–100.0)
MCV: 95.9 fL (ref 80.0–100.0)
Monocytes Absolute: 0.5 10*3/uL (ref 0.2–0.9)
Monocytes Absolute: 0.4 10*3/uL (ref 0.2–0.9)
Monocytes Relative: 7 %
Monocytes Relative: 5 %
NEUTROS ABS: 4.7 10*3/uL (ref 1.4–6.5)
NEUTROS PCT: 61 %
Neutro Abs: 4.3 10*3/uL (ref 1.4–6.5)
Neutrophils Relative %: 57 %
Platelets: 219 10*3/uL (ref 150–440)
Platelets: 236 10*3/uL (ref 150–440)
RBC: 3.75 MIL/uL — ABNORMAL LOW (ref 3.80–5.20)
RBC: 3.95 MIL/uL (ref 3.80–5.20)
RDW: 13.8 % (ref 11.5–14.5)
RDW: 14 % (ref 11.5–14.5)
WBC: 7.5 10*3/uL (ref 3.6–11.0)
WBC: 7.6 10*3/uL (ref 3.6–11.0)

## 2016-06-04 LAB — BASIC METABOLIC PANEL
ANION GAP: 8 (ref 5–15)
BUN: 13 mg/dL (ref 6–20)
CALCIUM: 9 mg/dL (ref 8.9–10.3)
CHLORIDE: 105 mmol/L (ref 101–111)
CO2: 27 mmol/L (ref 22–32)
CREATININE: 0.75 mg/dL (ref 0.44–1.00)
GFR calc non Af Amer: >60 mL/min (ref >60)
Glucose, Bld: 122 mg/dL — ABNORMAL HIGH (ref 65–99)
Potassium: 3.7 mmol/L (ref 3.5–5.1)
SODIUM: 140 mmol/L (ref 135–145)

## 2016-06-04 LAB — PHOSPHORUS: PHOSPHORUS: 3.4 mg/dL (ref 2.5–4.6)

## 2016-06-04 LAB — TROPONIN I: Troponin I: <0.03 ng/mL (ref <0.03)

## 2016-06-04 LAB — MAGNESIUM: MAGNESIUM: 1.4 mg/dL — AB (ref 1.7–2.4)

## 2016-06-04 MED ORDER — MAGNESIUM SULFATE 2 GM/50ML IV SOLN
2.0000 g | Freq: Once | INTRAVENOUS | Status: AC
  Administered 2016-06-04: 2 g via INTRAVENOUS
  Filled 2016-06-04: qty 50

## 2016-06-04 NOTE — Telephone Encounter (Signed)
Rachael Asal, MD called to and spoke to Dr. Ancil Boozer about patient condition of having heart racing and pulse is 135 but patient is asymptomatic. Dr. Ancil Boozer advised for patient to be sent to a Urgent Care due to symptoms.

## 2016-06-04 NOTE — ED Notes (Signed)
BP is elevated, however pt takes losartan and was unable to take it this morning as she's supposed to take it with breakfast and was unable to eat breakfast this morning due to having fasting labs drawn. MD notified. Orders received to let pt eat and to give pt morning dose of losartan and to ambulate pt while MD is waiting on cardiology to call back about pt.

## 2016-06-04 NOTE — Discharge Instructions (Signed)
Follow up with cardiology clinic for appointment on Tuesday with Dr. Caryl Comes.

## 2016-06-04 NOTE — Progress Notes (Signed)
Patient is here today for follow up, no complaints her bp in left arm was 155/57 heartrate 37  Rechecked this in right arm it was 164/57 heart rate 37

## 2016-06-04 NOTE — ED Provider Notes (Signed)
[facility] Emergency Department Provider Note  ____________________________________________  Time seen: Approximately 1:46 PM  I have reviewed the triage vital signs and the nursing notes.   HISTORY  Chief Complaint Fatigue and Bradycardia    HPI Rachael Castro is a 82 y.o. female who comes to the ED for evaluation of bradycardia. She was seen in oncology clinic this morning and noted to have a very low heart rate of about 40. She went to urgent care after that their recommendation and was then sent to the ED for further evaluation of the bradycardia. She denies any chest pain shortness of breath dizziness lightheadedness syncopeor falls. No feverting. Denies taking any kind of data blocker or calcium channel blocker.  Patient has been feeling a little bit fatigued for the past 3 or 4 days but thinks this is due to increased activity recently. Otherwise she was just following up with a routine visit and was unaware that she might have any abnormal findings and would not have reported any unusual symptoms on her own.     Urgent care note from [facility] reviewed  Past Medical History:  Diagnosis Date  . b 1999   breast cancer  . Diabetes mellitus   . GERD (gastroesophageal reflux disease)   . Hypercholesteremia   . Hypertension   . Kidney stones   . 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 Neuromuscular disorder (HCC)    hx shingles  . Personal history of colonic polyps   . Thyroid disease      Patient Active Problem List   Diagnosis Date Noted  . Medication monitoring encounter 04/29/2016  . Hyperlipidemia 10/23/2015  . Type 2 diabetes mellitus ([place]) 10/23/2015  . Hypertension 06/18/2015  . H/O malignant gastrointestinal stromal tumor (GIST) 05/30/2015  . Postherpetic neuralgia 04/17/2015  . DDD (degenerative disc disease), cervical 04/17/2015  . DDD (degenerative disc disease), lumbar 04/17/2015  .  Personal history of malignant neoplasm of breast 07/04/2013  . History of colonic polyps      Past Surgical History:  Procedure Laterality Date  . ABDOMINAL HYSTERECTOMY    . APPENDECTOMY    . BREAST SURGERY Right 1999   mastectomy  . CATARACT EXTRACTION W/PHACO Left 04/30/2015   Procedure: CATARACT EXTRACTION PHACO AND INTRAOCULAR LENS PLACEMENT (IOC);  Surgeon: Estill Cotta, MD;  Location: [facility] ORS;  Service: Ophthalmology;  Laterality: Left;  [place] 01:39 AP% 23.3 CDE 40.42 fluid pack lot # [id number]  . COLONOSCOPY  2008   Sankar  . EUS  02/12/2012   Procedure: UPPER ENDOSCOPIC ULTRASOUND (EUS) LINEAR;  Surgeon: Milus Banister, MD;  Location: [facility];  Service: Endoscopy;  Laterality: N/A;  radial linear  . EYE SURGERY Right 2013   cataract  . HERNIA REPAIR  2012  . LAPAROTOMY  2013   excision of gastric wall mass   . MASTECTOMY    . SALPINGOOPHORECTOMY Right 1979  . THYROIDECTOMY  1978  . TONSILLECTOMY    . TUBAL LIGATION    . UPPER GI ENDOSCOPY  2013     Prior to Admission medications   Medication Sig Start Date End Date Taking? Authorizing Provider  aspirin 81 MG tablet Take 81 mg by mouth daily.    Historical Provider, MD  atorvastatin (LIPITOR) 40 MG tablet Take 1 tablet (40 mg total) by mouth at bedtime. 04/29/16   Arnetha Courser, MD  calcium citrate-vitamin D 200-200 MG-UNIT TABS Take 1 tablet by mouth  daily.    Historical Provider, MD  DULoxetine (CYMBALTA) 30 MG capsule Limit 1-2 tabs by mouth per day if tolerated 04/08/16   Mohammed Kindle, MD  Gabapentin, Once-Daily, 300 MG TABS Limit one tab by mouth per day or twice a day if tolerated 04/08/16   Mohammed Kindle, MD  lidocaine (LIDODERM) 5 % Apply 1 - 2  patches to the right lower extremity in the area of pain for 12 hours then remove for 12 hours for treatment of postherpetic neuralgia and repeat process if tolerated 04/08/16   Mohammed Kindle, MD  losartan (COZAAR) 25 MG tablet Take 1 tablet (25 mg total) by mouth  daily. 04/29/16   Arnetha Courser, MD  Multiple Vitamin (MULTIVITAMIN) tablet Take 1 tablet by mouth daily.    Historical Provider, MD  omeprazole (PRILOSEC) 40 MG capsule Take 1 capsule (40 mg total) by mouth daily. 04/29/16   Arnetha Courser, MD  sitaGLIPtin-metformin (JANUMET) 50-500 MG tablet One by mouth once a day with breakfast for two weeks, then one pill twice a day; take with meal 05/18/16   Arnetha Courser, MD  thiamine (VITAMIN B-1) 100 MG tablet Take 100 mg by mouth daily.    Historical Provider, MD     Allergies Review of patient's allergies indicates no known allergies.   Family History  Problem Relation Age of Onset  . Cancer Mother   . Cancer Father     Social History Social History  Substance Use Topics  . Smoking status: Never Smoker  . Smokeless tobacco: Never Used  . Alcohol use No    Review of Systems  Constitutional:   No fever or chills.  ENT:   No sore throat. No rhinorrhea. Cardiovascular:   No chest pain. Respiratory:   No dyspnea or cough. Gastrointestinal:   Negative for abdominal pain, vomiting and diarrhea.  Genitourinary:   Negative for dysuria or difficulty urinating. Musculoskeletal:   Negative for focal pain or swelling Neurological:   Negative for headaches 10-point ROS otherwise negative.  ____________________________________________   PHYSICAL EXAM:  VITAL SIGNS: ED Triage Vitals  Enc Vitals Group     BP 06/04/16 1343 (!) 176/57     Pulse Rate 06/04/16 1343 76     Resp 06/04/16 1343 18     Temp 06/04/16 1343 98 F (36.7 C)     Temp Source 06/04/16 1343 Oral     SpO2 06/04/16 1343 98 %     Weight 06/04/16 1344 134 lb (60.8 kg)     Height 06/04/16 1344 5\' 1"  (1.549 m)     Head Circumference --      Peak Flow --      Pain Score 06/04/16 1344 0     Pain Loc --      Pain Edu? --      Excl. in [place]? --     Vital signs reviewed, nursing assessments reviewed.   Constitutional:   Alert and oriented. Well appearing and in no  distress. Eyes:   No scleral icterus. No conjunctival pallor. PERRL. EOMI.  No nystagmus. ENT   Head:   Normocephalic and atraumatic.   Nose:   No congestion/rhinnorhea. No septal hematoma   Mouth/Throat:   MMM, no pharyngeal erythema. No peritonsillar mass.    Neck:   No stridor. No SubQ emphysema. No meningismus. Hematological/Lymphatic/Immunilogical:   No cervical lymphadenopathy. Cardiovascular:   Regularly irregular rhythm, bigeminy on monitor. Auscultation reveals heart rate to be about 80/m, occurring in couplets. Peripheral  pulses about 40/m indicating the subsequent beats and each couplet do not produce a pulse wave through the peripheral circulation. Symmetric bilateral radial and DP pulses.  No murmurs.  Respiratory:   Normal respiratory effort without tachypnea nor retractions. Breath sounds are clear and equal bilaterally. No wheezes/rales/rhonchi. Gastrointestinal:   Soft and nontender. Non distended. There is no CVA tenderness.  No rebound, rigidity, or guarding. Genitourinary:   deferred Musculoskeletal:   Nontender with normal range of motion in all extremities. No joint effusions.  No lower extremity tenderness.  No edema. Neurologic:   Normal speech and language.  CN 2-10 normal. Motor grossly intact. No gross focal neurologic deficits are appreciated.  Skin:    Skin is warm, dry and intact. No rash noted.  No petechiae, purpura, or bullae.  ____________________________________________    LABS (pertinent positives/negatives) (all labs ordered are listed, but only abnormal results are displayed) Labs Reviewed  BASIC METABOLIC PANEL - Abnormal; Notable for the following:       Result Value   Glucose, Bld 122 (*)    All other components within normal limits  MAGNESIUM - Abnormal; Notable for the following:    Magnesium 1.4 (*)    All other components within normal limits  CBC WITH DIFFERENTIAL/PLATELET  TROPONIN I  PHOSPHORUS  URINALYSIS COMPLETEWITH  MICROSCOPIC ([facility] ONLY)   ____________________________________________   EKG  Interpreted by me Sinus rhythm rate of 95. Normal axis and intervals. Poor R-wave progression in anterior precordial leads but otherwise normal QRS. Normal ST segments and T waves. There are 5 PVCs on the monitor, predominantly occurring in a bigeminy pattern following sinus beats..  ____________________________________________    RADIOLOGY    ____________________________________________   PROCEDURES Procedures  ____________________________________________   INITIAL IMPRESSION / ASSESSMENT AND PLAN / ED COURSE  Pertinent labs & imaging results that were available during my care of the patient were reviewed by me and considered in my medical decision making (see chart for details).  Patient is well-appearing no acute distress, presents with bradycardic peripheral pulse due to frequent PVCs not transmitting. We'll check labs. Overall appears to be improving Today. Not on any negative chronotropic's. No evidence of infection or sepsis. Other than mild fatigue is asymptomatic.     Clinical Course  Comment By Time  Discussed with Dr. Ellyn Hack from cardiology. Because the patient is completely asymptomatic, he agrees that she does not require hospitalization, especially since her ventricular rate is actually in the 70s with frequent PVCs. He'll arrange for cardiology clinic follow-up with Dr. Jens Som electrophysiology within the next week. Replete magnesium for now as she is hypomagnesemic to 1.4. Carrie Mew, MD 08/16 1632    ----------------------------------------- 5:00 PM on 06/04/2016 -----------------------------------------  Patient ambulatory without symptoms. Tolerating oral intake. IV magnesium repleted. Follow up with cardiology clinic. ____________________________________________   FINAL CLINICAL IMPRESSION(S) / ED DIAGNOSES  Final diagnoses:  Ventricular bigeminy  Hypomagnesemia        Portions of this note were generated with dragon dictation software. Dictation errors may occur despite best attempts at proofreading.    Carrie Mew, MD 06/04/16 [phone]

## 2016-06-04 NOTE — ED Triage Notes (Signed)
Patient visiting her cancer doctor today at the [facility] when the nurse discovered that the patient's heart rate was 38. Patient continues to have a heart rate of 40 and complains of occassional dizziness when she stands up to quick. Patient states that she does not have a history of bradycardia.

## 2016-06-04 NOTE — ED Notes (Signed)
Got pt up and walked

## 2016-06-04 NOTE — ED Notes (Signed)
Took pt to the bathroom. 

## 2016-06-04 NOTE — ED Triage Notes (Signed)
Pt arrives here via [facility] from [place] with reports of low heart rate   She went to a follow up at the [facility] and they found her heart rate to be low and sent her to UC  UC monitored her and sent her to [place] for further work up   Pt reports that she has noticed increased weakness over the last week

## 2016-06-04 NOTE — ED Provider Notes (Signed)
[facility] ____________________________________________  Time seen: Approximately 1:05 PM  I have reviewed the triage vital signs and the nursing notes.   HISTORY  Chief Complaint Bradycardia   HPI Rachael Castro is a 82 y.o. female presenting with husband at bedside for evaluation of bradycardia. Patient reports that she was seen just prior to arrival in urgent care at the outpatient oncology office for a regular follow-up. Patient states that while she was in the office it was noted that her heart rate was in the low 30s. Patient was then sent to urgent care for further evaluation.  Patient denies any history of similar. Patient denies any cardiac history or cardiac evaluation. Patient does report that she has had some weakness and tired feeling over the last week but states she felt like it was just because she was staying busy. Patient also reports she had a slight episode of dizziness with movement earlier but declines any dizziness at this time.  Patient denies any chest pain, shortness of breath, headache, lightheadedness, dysuria, recent sickness, fevers, abdominal pain, extremity pain or abnormal extremity swelling.  Ashok Norris, MD PCP   Past Medical History:  Diagnosis Date  . b 1999   breast cancer  . Diabetes mellitus   . GERD (gastroesophageal reflux disease)   . Hypercholesteremia   . Hypertension   . Kidney stones   . 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 Neuromuscular disorder (HCC)    hx shingles  . Personal history of colonic polyps   . Thyroid disease     Patient Active Problem List   Diagnosis Date Noted  . Medication monitoring encounter 04/29/2016  . Hyperlipidemia 10/23/2015  . Type 2 diabetes mellitus ([place]) 10/23/2015  . Hypertension 06/18/2015  . H/O malignant gastrointestinal stromal tumor (GIST) 05/30/2015  . Postherpetic neuralgia 04/17/2015  . DDD (degenerative disc disease), cervical  04/17/2015  . DDD (degenerative disc disease), lumbar 04/17/2015  . Personal history of malignant neoplasm of breast 07/04/2013  . History of colonic polyps     Past Surgical History:  Procedure Laterality Date  . ABDOMINAL HYSTERECTOMY    . APPENDECTOMY    . BREAST SURGERY Right 1999   mastectomy  . CATARACT EXTRACTION W/PHACO Left 04/30/2015   Procedure: CATARACT EXTRACTION PHACO AND INTRAOCULAR LENS PLACEMENT (IOC);  Surgeon: Estill Cotta, MD;  Location: [facility] ORS;  Service: Ophthalmology;  Laterality: Left;  [place] 01:39 AP% 23.3 CDE 40.42 fluid pack lot # [id number]  . COLONOSCOPY  2008   Sankar  . EUS  02/12/2012   Procedure: UPPER ENDOSCOPIC ULTRASOUND (EUS) LINEAR;  Surgeon: Milus Banister, MD;  Location: [facility];  Service: Endoscopy;  Laterality: N/A;  radial linear  . EYE SURGERY Right 2013   cataract  . HERNIA REPAIR  2012  . LAPAROTOMY  2013   excision of gastric wall mass   . MASTECTOMY    . SALPINGOOPHORECTOMY Right 1979  . THYROIDECTOMY  1978  . TONSILLECTOMY    . TUBAL LIGATION    . UPPER GI ENDOSCOPY  2013   No current facility-administered medications for this encounter.   Current Outpatient Prescriptions:  .  aspirin 81 MG tablet, Take 81 mg by mouth daily., Disp: , Rfl:  .  atorvastatin (LIPITOR) 40 MG tablet, Take 1 tablet (40 mg total) by mouth at bedtime., Disp: 30 tablet, Rfl: 6 .  calcium citrate-vitamin D 200-200 MG-UNIT TABS, Take 1 tablet by mouth daily., Disp: , Rfl:  .  DULoxetine (CYMBALTA) 30 MG capsule, Limit 1-2 tabs by mouth per day if tolerated, Disp: 150 capsule, Rfl: 0 .  Gabapentin, Once-Daily, 300 MG TABS, Limit one tab by mouth per day or twice a day if tolerated, Disp: 180 tablet, Rfl: 0 .  lidocaine (LIDODERM) 5 %, Apply 1 - 2  patches to the right lower extremity in the area of pain for 12 hours then remove for 12 hours for treatment of postherpetic neuralgia and repeat process if tolerated, Disp: 60 patch, Rfl: 2 .  losartan  (COZAAR) 25 MG tablet, Take 1 tablet (25 mg total) by mouth daily., Disp: 30 tablet, Rfl: 6 .  Multiple Vitamin (MULTIVITAMIN) tablet, Take 1 tablet by mouth daily., Disp: , Rfl:  .  omeprazole (PRILOSEC) 40 MG capsule, Take 1 capsule (40 mg total) by mouth daily., Disp: 30 capsule, Rfl: 5 .  sitaGLIPtin-metformin (JANUMET) 50-500 MG tablet, One by mouth once a day with breakfast for two weeks, then one pill twice a day; take with meal, Disp: 60 tablet, Rfl: 0 .  thiamine (VITAMIN B-1) 100 MG tablet, Take 100 mg by mouth daily., Disp: , Rfl:   Allergies Review of patient's allergies indicates no known allergies.  Family History  Problem Relation Age of Onset  . Cancer Mother   . Cancer Father     Social History Social History  Substance Use Topics  . Smoking status: Never Smoker  . Smokeless tobacco: Never Used  . Alcohol use No    Review of Systems Constitutional: No fever/chills Eyes: No visual changes. ENT: No sore throat. Cardiovascular: Denies chest pain. Respiratory: Denies shortness of breath. Gastrointestinal: No abdominal pain.  No nausea, no vomiting.  No diarrhea.  No constipation. Genitourinary: Negative for dysuria. Musculoskeletal: Negative for back pain. Skin: Negative for rash. Neurological: Negative for headaches, focal weakness or numbness.  10-point ROS otherwise negative.  ____________________________________________   PHYSICAL EXAM:  VITAL SIGNS: ED Triage Vitals  Enc Vitals Group     BP 06/04/16 1206 (!) 201/50     Pulse Rate 06/04/16 1206 (!) 39     Resp 06/04/16 1206 16     Temp 06/04/16 1206 97.7 F (36.5 C)     Temp Source 06/04/16 1206 Oral     SpO2 06/04/16 1206 100 %     Weight --      Height --      Head Circumference --      Peak Flow --      Pain Score 06/04/16 1214 0     Pain Loc --      Pain Edu? --      Excl. in [place]? --     Constitutional: Alert and oriented. Well appearing and in no acute distress. Eyes: Conjunctivae  are normal. PERRL. EOMI. ENT      Head: Normocephalic and atraumatic.      Nose: No congestion/rhinnorhea.      Mouth/Throat: Mucous membranes are moist. Hematological/Lymphatic/Immunilogical: No cervical lymphadenopathy. Cardiovascular: Irregular regular rhythm. Bradycardic. Good peripheral circulation. Respiratory: Normal respiratory effort without tachypnea nor retractions. Breath sounds are clear and equal bilaterally. No wheezes/rales/rhonchi.. Gastrointestinal: Soft and nontender. No CVA tenderness. Musculoskeletal:  Nontender with normal range of motion in all extremities.  Neurologic:  Normal speech and language. No gross focal neurologic deficits are appreciated. Speech is normal. No gait instability.  Skin:  Skin is warm, dry and intact. No rash noted. Psychiatric: Mood and affect are normal. Speech and behavior are normal. Patient exhibits appropriate  insight and judgment   ___________________________________________   LABS (all labs ordered are listed, but only abnormal results are displayed)  Labs Reviewed - No data to display ____________________________________________  EKG  ED ECG REPORT I, Marylene Land, the attending provider and Dr Alveta Heimlich, personally viewed and interpreted this ECG.  Date: 06/04/2016 EKG Time: 1228 Ventricular rate: 77 Rhythm: marked sinus bradycardia with premature supraventricular complexes QRS Axis: normal Intervals: normal ST/T Wave abnormalities: nonspecific ST abnormality ____________________________________________  PROCEDURES Procedures   INITIAL IMPRESSION / ASSESSMENT AND PLAN / ED COURSE  Pertinent labs & imaging results that were available during my care of the patient were reviewed by me and considered in my medical decision making (see chart for details).  Overall well-appearing patient. Presenting with husband at bedside for evaluation of bradycardia. Patient noted to continue bradycardia in urgent care with heart rate  varying from 34-42. Patient declines any other complaints or symptoms at this time. Patient also hypertensive in exam room at this time. Discussed in detail with patient and her spouse due to marked bradycardia, recommend patient be further evaluated in the emergency room at this time and recommend EMS transfer. Patient and spouse agree to this plan. Patient transported to [facility] via EMS. Myriam Jacobson RN charge nurse called and report given. Patient stable at the time of transfer.  Discussed follow up with Primary care physician this week. Discussed follow up and return parameters including no resolution or any worsening concerns. Patient verbalized understanding and agreed to plan.   ____________________________________________   FINAL CLINICAL IMPRESSION(S) / ED DIAGNOSES  Final diagnoses:  Bradycardia     Discharge Medication List as of 06/04/2016 12:52 PM      Note: This dictation was prepared with Dragon dictation along with smaller phrase technology. Any transcriptional errors that result from this process are unintentional.    Clinical Course      Marylene Land, NP 06/04/16 1316

## 2016-06-04 NOTE — Progress Notes (Signed)
[facility] day:  06/04/16  Chief Complaint: Rachael Castro is a 82 y.o. female with a history of right breast cancer (1999) and gastrointestinal stromal tumor (2013) who is seen for 6 month assessment.  HPI: The patient was last seen in the medical oncology clinic on 12/05/2015.  At that time, felt good. She denied any breast or abdominal symptoms. Exam was unremarkable.  Abdominal and pelvic CT scan on 12/03/2015 revealed a stable exam with no evidence of recurrent disease.    During the interim, she states that if she jumps up quickly she becomes dizzy. She notes shingles pain in her right lower extremity. She denies any breast concerns.  She denies any abdominal issues.   Past Medical History:  Diagnosis Date  . b 1999   breast cancer  . Diabetes mellitus   . GERD (gastroesophageal reflux disease)   . Hypercholesteremia   . Hypertension   . Kidney stones   . 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 Neuromuscular disorder (HCC)    hx shingles  . Personal history of colonic polyps   . Thyroid disease     Past Surgical History:  Procedure Laterality Date  . ABDOMINAL HYSTERECTOMY    . APPENDECTOMY    . BREAST SURGERY Right 1999   mastectomy  . CATARACT EXTRACTION W/PHACO Left 04/30/2015   Procedure: CATARACT EXTRACTION PHACO AND INTRAOCULAR LENS PLACEMENT (IOC);  Surgeon: Estill Cotta, MD;  Location: [facility] ORS;  Service: Ophthalmology;  Laterality: Left;  [place] 01:39 AP% 23.3 CDE 40.42 fluid pack lot # [id number]  . COLONOSCOPY  2008   Sankar  . EUS  02/12/2012   Procedure: UPPER ENDOSCOPIC ULTRASOUND (EUS) LINEAR;  Surgeon: Milus Banister, MD;  Location: [facility];  Service: Endoscopy;  Laterality: N/A;  radial linear  . EYE SURGERY Right 2013   cataract  . HERNIA REPAIR  2012  . LAPAROTOMY  2013   excision of gastric wall mass   . MASTECTOMY    . SALPINGOOPHORECTOMY Right 1979  .  THYROIDECTOMY  1978  . TONSILLECTOMY    . TUBAL LIGATION    . UPPER GI ENDOSCOPY  2013    Family History  Problem Relation Age of Onset  . Cancer Mother   . Cancer Father     Social History:  reports that she has never smoked. She has never used smokeless tobacco. She reports that she does not drink alcohol or use drugs.  The patient is alone today.  Allergies: No Known Allergies  Current Medications: Current Outpatient Prescriptions  Medication Sig Dispense Refill  . aspirin 81 MG tablet Take 81 mg by mouth daily.    Marland Kitchen atorvastatin (LIPITOR) 40 MG tablet Take 1 tablet (40 mg total) by mouth at bedtime. 30 tablet 6  . calcium citrate-vitamin D 200-200 MG-UNIT TABS Take 1 tablet by mouth daily.    . DULoxetine (CYMBALTA) 30 MG capsule Limit 1-2 tabs by mouth per day if tolerated 150 capsule 0  . Gabapentin, Once-Daily, 300 MG TABS Limit one tab by mouth per day or twice a day if tolerated 180 tablet 0  . lidocaine (LIDODERM) 5 % Apply 1 - 2  patches to the right lower extremity in the area of pain for 12 hours then remove for 12 hours for treatment of postherpetic neuralgia and repeat process if tolerated 60 patch 2  . losartan (COZAAR) 25 MG tablet Take 1 tablet (  25 mg total) by mouth daily. 30 tablet 6  . Multiple Vitamin (MULTIVITAMIN) tablet Take 1 tablet by mouth daily.    Marland Kitchen omeprazole (PRILOSEC) 40 MG capsule Take 1 capsule (40 mg total) by mouth daily. 30 capsule 5  . sitaGLIPtin-metformin (JANUMET) 50-500 MG tablet One by mouth once a day with breakfast for two weeks, then one pill twice a day; take with meal 60 tablet 0  . thiamine (VITAMIN B-1) 100 MG tablet Take 100 mg by mouth daily.     No current facility-administered medications for this visit.     Review of Systems:  GENERAL:  Feels "ok".  No fevers, sweats or weight loss. PERFORMANCE STATUS (ECOG):  1 HEENT:  No visual changes, runny nose, sore throat, mouth sores or tenderness. Lungs: No shortness of breath or  cough.  No hemoptysis. Cardiac:  No chest pain, palpitations, orthopnea, or PND. GI:  No nausea, vomiting, diarrhea, constipation, melena or hematochezia. GU:  Kidney stones.  No urgency, frequency, dysuria, or hematuria.  Urology follows. Musculoskeletal:  No back pain.  No joint pain.  No muscle tenderness. Extremities:  No pain or swelling. Skin:  No rashes or skin changes. Neuro:  Dizzy if gets up quickly.  No headache, numbness or weakness, balance or coordination issues. Endocrine:  No diabetes, thyroid issues, hot flashes or night sweats. Psych:  No mood changes, depression or anxiety. Pain:  No focal pain. Review of systems:  All other systems reviewed and found to be negative.  Physical Exam: Blood pressure (!) 155/57, pulse (!) 37, temperature 97.4 F (36.3 C), temperature source Tympanic, resp. rate 18, weight 134 lb 14.7 oz (61.2 kg). GENERAL:  Well developed, well nourished, woman sitting comfortably in the exam room in no acute distress. MENTAL STATUS:  Alert and oriented to person, place and time. HEAD:  Wearing a white cap.  Short curly gray hair.  Normocephalic, atraumatic, face symmetric, no Cushingoid features. EYES:  Glasses.  Brown eyes s/p left cataract removal.  Pupils equal round and reactive to light and accomodation.  No conjunctivitis or scleral icterus. ENT:  Oropharynx clear without lesion.  Dentures.  Tongue normal. Mucous membranes moist.  RESPIRATORY:  Clear to auscultation without rales, wheezes or rhonchi. CARDIOVASCULAR:  Bradycardia.  Irregularly irregular.  No murmur, rub or gallop. ABDOMEN:  Soft, non-tender, with active bowel sounds, and no hepatosplenomegaly.  No masses. SKIN: No rashes, ulcers or skin changes. EXTREMITIES:  No edema, skin discoloration or tenderness.  No palpable cords. LYMPH NODES: No palpable cervical, supraclavicular, axillary or inguinal adenopathy  NEUROLOGICAL: Unremarkable. PSYCH:  Appropriate.   Appointment on 06/04/2016   Component Date Value Ref Range Status  . WBC 06/04/2016 7.5  3.6 - 11.0 K/uL Final  . RBC 06/04/2016 3.75* 3.80 - 5.20 MIL/uL Final  . Hemoglobin 06/04/2016 12.1  12.0 - 16.0 g/dL Final  . HCT 06/04/2016 35.9  35.0 - 47.0 % Final  . MCV 06/04/2016 95.7  80.0 - 100.0 fL Final  . MCH 06/04/2016 32.2  26.0 - 34.0 pg Final  . MCHC 06/04/2016 33.6  32.0 - 36.0 g/dL Final  . RDW 06/04/2016 13.8  11.5 - 14.5 % Final  . Platelets 06/04/2016 219  150 - 440 K/uL Final  . Neutrophils Relative % 06/04/2016 57  % Final  . Neutro Abs 06/04/2016 4.3  1.4 - 6.5 K/uL Final  . Lymphocytes Relative 06/04/2016 33  % Final  . Lymphs Abs 06/04/2016 2.5  1.0 - 3.6 K/uL Final  .  Monocytes Relative 06/04/2016 7  % Final  . Monocytes Absolute 06/04/2016 0.5  0.2 - 0.9 K/uL Final  . Eosinophils Relative 06/04/2016 2  % Final  . Eosinophils Absolute 06/04/2016 0.2  0 - 0.7 K/uL Final  . Basophils Relative 06/04/2016 1  % Final  . Basophils Absolute 06/04/2016 0.0  0 - 0.1 K/uL Final  . Sodium 06/04/2016 139  135 - 145 mmol/L Final  . Potassium 06/04/2016 3.8  3.5 - 5.1 mmol/L Final  . Chloride 06/04/2016 104  101 - 111 mmol/L Final  . CO2 06/04/2016 28  22 - 32 mmol/L Final  . Glucose, Bld 06/04/2016 128* 65 - 99 mg/dL Final  . BUN 06/04/2016 14  6 - 20 mg/dL Final  . Creatinine, Ser 06/04/2016 0.67  0.44 - 1.00 mg/dL Final  . Calcium 06/04/2016 9.0  8.9 - 10.3 mg/dL Final  . Total Protein 06/04/2016 7.0  6.5 - 8.1 g/dL Final  . Albumin 06/04/2016 3.9  3.5 - 5.0 g/dL Final  . AST 06/04/2016 18  15 - 41 U/L Final  . ALT 06/04/2016 13* 14 - 54 U/L Final  . Alkaline Phosphatase 06/04/2016 66  38 - 126 U/L Final  . Total Bilirubin 06/04/2016 0.6  0.3 - 1.2 mg/dL Final  . GFR calc non Af Amer 06/04/2016 >60  >60 mL/min Final  . GFR calc Af Amer 06/04/2016 >60  >60 mL/min Final   Comment: (NOTE) The eGFR has been calculated using the CKD EPI equation. This calculation has not been validated in all clinical  situations. eGFR's persistently <60 mL/min signify possible Chronic Kidney Disease.   Georgiann Hahn gap 06/04/2016 7  5 - 15 Final    Assessment:  Rachael Castro is a 82 y.o. female with a history of right breast cancer status post modified radical mastectomy in 1999 followed by 4 cycles of Adriamycin and Cytoxan. She received tamoxifen then extended adjuvant therapy with Femara.    Left sided mammogram on 07/19/2015 revealed no evidence of malignancy.  CA27.29 was 46.6 on 05/30/2015, 41.9 on 07/09/2015, 42.9 on 11/05/2015, and 46.2 on 12/05/2015.  She has a history of gastrointestinal stromal tumor (GIST) status post resection on 02/27/2012. Pathology revealed a 6.4 cm tumor. She received a year of [place] beginning 03/2012. Her dose was reduced secondary to side effects.  Abdominal and pelvic CT scan on 04/17/2015 revealed postsurgical changes related to the prior gastric resection. There was no evidence of recurrent or metastatic disease. There were multiple nonobstructing bilateral renal calculi measuring up to 2 mm.  Abdominal and pelvic CT scan on 12/03/2015 revealed a stable exam with no evidence of recurrent disease.  There were tiny nonobstructive intrarenal calculi without evidence of hydronephrosis.  Symptomatically, she feels "ok". She denies any breast or abdominal symptoms. She is bradycardic (pulse 37).  Plan: 1. Labs today:  CBC with diff, CMP, CA27.29. 2. Review CA27.29.  Discuss follow-up imaging. 3. Patient to go to acute care clinic now secondary to bradycardia. 4. Schedule chest, abdomen, and pelvic CT scan. 5. Follow-up mammogram on 07/17/2016 (ordered by Dr. Jamal Collin). 6. RTC after CT scan for MD assessment including breast exam, labs (CBC with diff, CMP, CA27.29), and review of scans.   Lequita Asal, MD  06/04/2016, 11:31 AM

## 2016-06-05 ENCOUNTER — Telehealth: Payer: Self-pay | Admitting: *Deleted

## 2016-06-05 LAB — CANCER ANTIGEN 27.29: CA 27.29: 53.5 U/mL — ABNORMAL HIGH (ref 0.0–38.6)

## 2016-06-05 NOTE — Telephone Encounter (Signed)
Pt called back and she was agreeable to moving the scans up to one day in the next couple of weeks and she needs to avoid 8/24 because she is seeing the card.  She did stay at acute care til 6 pm and they gave her magnesium and it got her heart rate a little higher but still low number.  I told her Eldridge Abrahams would call her with new updated appt .

## 2016-06-05 NOTE — Telephone Encounter (Signed)
Called pt and got their voicemail and left message that I need to dis cuss lab results and I want to know what was done about her heart rate that was low yest.  Please call me back. Left my ascom number.

## 2016-06-05 NOTE — Telephone Encounter (Signed)
-----   Message from Lequita Asal, MD sent at 06/05/2016  8:44 AM EDT ----- Regarding: Let's move up her scans   GIST and distant history of breast cancer. Increasing CA27.29. Bradycardic yesterday.  Let's move up CT scans.  M ----- Message ----- From: Interface, Lab In [place] Sent: 06/04/2016  10:26 AM To: Lequita Asal, MD

## 2016-06-05 NOTE — Telephone Encounter (Signed)
-----   Message from Lequita Asal, MD sent at 06/05/2016  8:44 AM EDT ----- Regarding: Let's move up her scans   GIST and distant history of breast cancer. Increasing CA27.29. Bradycardic yesterday.  Let's move up CT scans.  M ----- Message ----- From: Interface, Lab In [place] Sent: 06/04/2016  10:26 AM To: Lequita Asal, MD

## 2016-06-12 ENCOUNTER — Encounter: Payer: Self-pay | Admitting: Internal Medicine

## 2016-06-12 ENCOUNTER — Ambulatory Visit (INDEPENDENT_AMBULATORY_CARE_PROVIDER_SITE_OTHER): Payer: Medicare Other | Admitting: Internal Medicine

## 2016-06-12 ENCOUNTER — Encounter: Payer: Self-pay | Admitting: *Deleted

## 2016-06-12 VITALS — BP 104/64 | HR 74 | Ht 61.0 in | Wt 128.2 lb

## 2016-06-12 DIAGNOSIS — I493 Ventricular premature depolarization: Secondary | ICD-10-CM | POA: Diagnosis not present

## 2016-06-12 DIAGNOSIS — R600 Localized edema: Secondary | ICD-10-CM

## 2016-06-12 DIAGNOSIS — R0600 Dyspnea, unspecified: Secondary | ICD-10-CM | POA: Diagnosis not present

## 2016-06-12 NOTE — Patient Instructions (Addendum)
Medication Instructions: - Your physician recommends that you continue on your current medications as directed. Please refer to the Current Medication list given to you today.  Labwork: - none  Procedures/Testing: - Your physician has requested that you have a lower extremity venous duplex- ASAP This test is an ultrasound of the veins in the legs. It looks at venous blood flow that carries blood from the heart to the legs. Allow one hour for a Lower Venous exam.There are no restrictions or special instructions.  - Your physician has requested that you have an echocardiogram. Echocardiography is a painless test that uses sound waves to create images of your heart. It provides your doctor with information about the size and shape of your heart and how well your heart's chambers and valves are working. This procedure takes approximately one hour. There are no restrictions for this procedure.  - Your physician has requested that you have an exercise tolerance test (please schedule for Thursday 06/19/16 @ 9:00 am)  ** Wear comfortable clothes/ shoes     Nothing caffeinated/ decaffeinated for 24 hours prior     You may take your medications the day of your test  Follow-Up: - pending results of your test  Any Additional Special Instructions Will Be Listed Below (If Applicable).     If you need a refill on your cardiac medications before your next appointment, please call your pharmacy.

## 2016-06-12 NOTE — Progress Notes (Signed)
ELECTROPHYSIOLOGY CONSULT NOTE  Patient ID: Rachael Castro, MRN: [id number], DOB/AGE: 02/09/1931 82 y.o. Admit date: (Not on file) Date of Consult: 06/12/2016  Primary Castro: Rachael Norris, MD Primary Cardiologist: Rachael Castro Rachael  Chief Complaint: bradycardia//PVCs   HPI Rachael Castro is a 82 y.o. female  Referred because of bradycardia noted at the cancer center appointment.  She was unaware alimentations that date. Her heart rate was detected in the 40s. She was sent to the emergency room where it was confirmed and electro cardiogram demonstrated bigeminy.  She's noticed some exercise intolerance over recent months. She has had some asymmetric right sided lower extremity edema. She denies history of blood clots prolonged trips.  Dyspnea on exertion has been unassociated with chest discomfort. She has no known history of coronary artery disease  She has a history of cancer-breast with prior mastectomy remotely.      Past Medical History:  Diagnosis Date  . b 1999   breast cancer  . Diabetes mellitus   . GERD (gastroesophageal reflux disease)   . Hypercholesteremia   . Hypertension   . Kidney stones   . 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 Neuromuscular disorder (HCC)    hx shingles  . Personal history of colonic polyps   . Thyroid disease       Surgical History:  Past Surgical History:  Procedure Laterality Date  . ABDOMINAL HYSTERECTOMY    . APPENDECTOMY    . BREAST SURGERY Right 1999   mastectomy  . CATARACT EXTRACTION W/PHACO Left 04/30/2015   Procedure: CATARACT EXTRACTION PHACO AND INTRAOCULAR LENS PLACEMENT (IOC);  Surgeon: Rachael Cotta, MD;  Location: [facility] OR[facility];  Service: Ophthalmology;  Laterality: Left;  [place] 01:39 AP% 23.3 CDE 40.42 fluid pack lot # [id number]  . COLONOSCOPY  2008   Rachael Castro  . EUS  02/12/2012   Procedure: UPPER ENDOSCOPIC ULTRASOUND (EUS) LINEAR;  Surgeon: Rachael Banister, MD;  Location: [facility];  Service: Endoscopy;  Laterality: N/A;  radial linear  . EYE SURGERY Right 2013   cataract  . HERNIA REPAIR  2012  . LAPAROTOMY  2013   excision of gastric wall mass   . MASTECTOMY    . SALPINGOOPHORECTOMY Right 1979  . THYROIDECTOMY  1978  . TONSILLECTOMY    . TUBAL LIGATION    . UPPER GI ENDOSCOPY  2013     Home Meds: Prior to Admission medications   Medication Sig Start Date End Date Taking? Authorizing Provider  aspirin 81 MG tablet Take 81 mg by mouth daily.    Historical Provider, MD  atorvastatin (LIPITOR) 40 MG tablet Take 1 tablet (40 mg total) by mouth at bedtime. 04/29/16   Rachael Courser, MD  calcium citrate-vitamin D 200-200 MG-UNIT TABS Take 1 tablet by mouth daily.    Historical Provider, MD  DULoxetine (CYMBALTA) 30 MG capsule Limit 1-2 tabs by mouth per day if tolerated 04/08/16   Rachael Kindle, MD  Gabapentin, Once-Daily, 300 MG TABS Limit one tab by mouth per day or twice a day if tolerated 04/08/16   Rachael Kindle, MD  lidocaine (LIDODERM) 5 % Apply 1 - 2  patches to the right lower extremity in the area of pain for 12 hours then remove for 12 hours for treatment of postherpetic neuralgia and repeat process if tolerated 04/08/16   Rachael Kindle, MD  losartan (COZAAR) 25 MG tablet Take 1 tablet (25 mg total)  by mouth daily. 04/29/16   Rachael Courser, MD  Multiple Vitamin (MULTIVITAMIN) tablet Take 1 tablet by mouth daily.    Historical Provider, MD  omeprazole (PRILOSEC) 40 MG capsule Take 1 capsule (40 mg total) by mouth daily. 04/29/16   Rachael Courser, MD  sitaGLIPtin-metformin (JANUMET) 50-500 MG tablet One by mouth once a day with breakfast for two weeks, then one pill twice a day; take with meal 05/18/16   Rachael Courser, MD  thiamine (VITAMIN B-1) 100 MG tablet Take 100 mg by mouth daily.    Historical Provider, MD    Allergies: No Known Allergies  Social History   Social History  . Marital status: Married    Spouse name: N/A   . Number of children: N/A  . Years of education: N/A   Occupational History  . Not on file.   Social History Main Topics  . Smoking status: Never Smoker  . Smokeless tobacco: Never Used  . Alcohol use No  . Drug use: No  . Sexual activity: Not on file   Other Topics Concern  . Not on file   Social History Narrative  . No narrative on file     Family History  Problem Relation Age of Onset  . Cancer Mother   . Cancer Father      ROS:  Please see the history of present illness.     All other systems reviewed and negative.    Physical Exam:  Blood pressure 104/64, pulse 74, height 5\' 1"  (1.549 m), weight 128 lb 4 oz (58.2 kg). General: Well developed, well nourished female in no acute distress. Head: Normocephalic, atraumatic, sclera non-icteric, no xanthomas, nares are without discharge. EENT: normal  Lymph Nodes:  none Neck: Negative for carotid bruits. JVD  8-9 cm Back:without scoliosis kyphosis Lungs: Clear bilaterally to auscultation without wheezes, rales, or rhonchi. Breathing is unlabored. Heart: RRR with S1 S2. 2/6 systolic  murmur . No rubs, or gallops appreciated. Abdomen: Soft, non-tender, non-distended with normoactive bowel sounds. No hepatomegaly. No rebound/guarding. No obvious abdominal masses. Msk:  Strength and tone appear normal for age. Extremities: No clubbing or cyanosis. No  edema.  Distal pedal pulses are 2+ and equal bilaterally. Skin: Warm and Dry Neuro: Alert and oriented X 3. CN III-XII intact Grossly normal sensory and motor function . Psych:  Responds to questions appropriately with a normal affect.      Labs: Cardiac Enzymes No results for input(s): CKTOTAL, CKMB, TROPONINI in the last 72 hours. CBC Lab Results  Component Value Date   WBC 7.6 06/04/2016   HGB 12.7 06/04/2016   HCT 37.9 06/04/2016   MCV 95.9 06/04/2016   PLT 236 06/04/2016   PROTIME: No results for input(s): LABPROT, INR in the last 72 hours. Chemistry No  results for input(s): NA, K, CL, CO2, BUN, CREATININE, CALCIUM, PROT, BILITOT, ALKPHOS, ALT, AST, GLUCOSE in the last 168 hours.  Invalid input(s): LABALBU Lipids Lab Results  Component Value Date   CHOL 213 (H) 04/29/2016   HDL 49 04/29/2016   LDLCALC 129 04/29/2016   TRIG 176 (H) 04/29/2016   BNP No results found for: PROBNP Thyroid Function Tests: No results for input(s): TSH, T4TOTAL, T3FREE, THYROIDAB in the last 72 hours.  Invalid input(s): FREET3 Miscellaneous No results found for: DDIMER  Radiology/Studies:  No results found.  EKG: Sinus rhythm at 77 Intervals 17/08/42 Axis XXII PVCs with a left bundle branch like inferior axis morphology in a pattern of penta  Geminy  ECG 16 August demonstrates sinus rhythm at 75 intervals 16/08/40 with PVCs mostly in a pattern of bigeminy with a much clearer left bundle branch block appearance QS noted in aVR and aVL   Assessment and Plan:  RVOT PVCs with bigeminy  Dyspnea on exertion  Right lower extremity swelling-mostly resolved  Diabetes  HFpEF   The patient has dyspnea over the last 6 months. The finding of PVCs has suggested a causal relationship. That remains to be determined. PVCs can cause dyspnea number of ways, functional bradycardia, cardiomyopathy. We will undertake an echocardiogram to look at LV function. We will undertake a treadmill to look for PVCs burden response to exercise.  Her dyspnea may also be unrelated to the PVCs. It's relatively acute nature raises a concern also of coronary artery disease particularly in a in older diabetic with a history of hypertension. In the event that the aforementioned is on illuminating, I would anticipate Myoview scanning.  Once we have the above information, medical therapy for PVC suppression may well E indicated.  We will undertake a venous Doppler for her right lower extremity swelling not withstanding the fact it is mostly resolved. It's presence raises also concern  about pulmonary embolism potentially contributing to her dyspnea   There is also a mild degree of volume overload. I await the readings of   E/E' to support the diagnosis of HFpEF. She may need a low-dose diuretic.     Virl Axe

## 2016-06-16 ENCOUNTER — Ambulatory Visit: Payer: Medicare Other

## 2016-06-16 DIAGNOSIS — R600 Localized edema: Secondary | ICD-10-CM

## 2016-06-17 ENCOUNTER — Ambulatory Visit
Admission: RE | Admit: 2016-06-17 | Discharge: 2016-06-17 | Disposition: A | Discharge status: Home / Self Care | Payer: Medicare Other | Source: Ambulatory Visit | Attending: Hematology and Oncology | Admitting: Hematology and Oncology

## 2016-06-17 ENCOUNTER — Other Ambulatory Visit: Payer: Self-pay | Admitting: Family Medicine

## 2016-06-17 DIAGNOSIS — I700 Atherosclerosis of aorta: Secondary | ICD-10-CM | POA: Insufficient documentation

## 2016-06-17 DIAGNOSIS — N200 Calculus of kidney: Secondary | ICD-10-CM | POA: Insufficient documentation

## 2016-06-17 DIAGNOSIS — Z853 Personal history of malignant neoplasm of breast: Secondary | ICD-10-CM

## 2016-06-17 DIAGNOSIS — R590 Localized enlarged lymph nodes: Secondary | ICD-10-CM | POA: Insufficient documentation

## 2016-06-17 DIAGNOSIS — I2510 Atherosclerotic heart disease of native coronary artery without angina pectoris: Secondary | ICD-10-CM | POA: Diagnosis not present

## 2016-06-17 DIAGNOSIS — R001 Bradycardia, unspecified: Secondary | ICD-10-CM | POA: Diagnosis not present

## 2016-06-17 DIAGNOSIS — Z8509 Personal history of malignant neoplasm of other digestive organs: Secondary | ICD-10-CM | POA: Insufficient documentation

## 2016-06-17 MED ORDER — IOPAMIDOL (ISOVUE-300) INJECTION 61%
100.0000 mL | Freq: Once | INTRAVENOUS | Status: AC | PRN
  Administered 2016-06-17: 100 mL via INTRAVENOUS

## 2016-06-17 NOTE — Telephone Encounter (Signed)
Reviewed last creatinine; Rx approved

## 2016-06-19 ENCOUNTER — Ambulatory Visit (INDEPENDENT_AMBULATORY_CARE_PROVIDER_SITE_OTHER): Payer: Medicare Other

## 2016-06-19 DIAGNOSIS — I493 Ventricular premature depolarization: Secondary | ICD-10-CM | POA: Diagnosis not present

## 2016-06-19 DIAGNOSIS — R0600 Dyspnea, unspecified: Secondary | ICD-10-CM

## 2016-06-22 MED ORDER — EYE WASH OPHTH SOLN
OPHTHALMIC | Status: AC
  Filled 2016-06-22: qty 118

## 2016-06-22 MED ORDER — FLUORESCEIN SODIUM 1 MG OP STRP
ORAL_STRIP | OPHTHALMIC | Status: AC
  Filled 2016-06-22: qty 1

## 2016-06-22 MED ORDER — TETRACAINE HCL 0.5 % OP SOLN
OPHTHALMIC | Status: AC
  Filled 2016-06-22: qty 2

## 2016-06-24 ENCOUNTER — Other Ambulatory Visit: Payer: Medicare Other

## 2016-06-24 ENCOUNTER — Telehealth: Payer: Self-pay | Admitting: *Deleted

## 2016-06-25 ENCOUNTER — Other Ambulatory Visit: Payer: Self-pay | Admitting: General Surgery

## 2016-06-25 DIAGNOSIS — Z1231 Encounter for screening mammogram for malignant neoplasm of breast: Secondary | ICD-10-CM

## 2016-06-25 DIAGNOSIS — Z853 Personal history of malignant neoplasm of breast: Secondary | ICD-10-CM

## 2016-06-27 ENCOUNTER — Telehealth: Payer: Self-pay

## 2016-06-27 LAB — EXERCISE TOLERANCE TEST
CHL CUP MPHR: 138 {beats}/min
CSEPED: 11 min
CSEPEW: 3.9 METS
CSEPPHR: 129 {beats}/min
Exercise duration (sec): 51 s
Percent HR: 93 %
Rest HR: 81 {beats}/min

## 2016-06-27 NOTE — Telephone Encounter (Signed)
Pt is aware that DVT ultrasound study is normal and no sign of clot. She was very happy about results.

## 2016-07-08 ENCOUNTER — Ambulatory Visit: Payer: Medicare Other | Admitting: Pain Medicine

## 2016-07-08 ENCOUNTER — Other Ambulatory Visit: Payer: Self-pay | Admitting: General Surgery

## 2016-07-08 ENCOUNTER — Other Ambulatory Visit: Payer: Self-pay | Admitting: *Deleted

## 2016-07-08 DIAGNOSIS — Z1231 Encounter for screening mammogram for malignant neoplasm of breast: Secondary | ICD-10-CM

## 2016-07-11 ENCOUNTER — Other Ambulatory Visit: Payer: Self-pay

## 2016-07-11 ENCOUNTER — Ambulatory Visit (INDEPENDENT_AMBULATORY_CARE_PROVIDER_SITE_OTHER): Payer: Medicare Other

## 2016-07-11 DIAGNOSIS — R0600 Dyspnea, unspecified: Secondary | ICD-10-CM

## 2016-07-11 DIAGNOSIS — I493 Ventricular premature depolarization: Secondary | ICD-10-CM | POA: Diagnosis not present

## 2016-07-14 ENCOUNTER — Encounter: Payer: Self-pay | Admitting: *Deleted

## 2016-08-06 ENCOUNTER — Inpatient Hospital Stay (HOSPITAL_BASED_OUTPATIENT_CLINIC_OR_DEPARTMENT_OTHER): Payer: Medicare Other | Admitting: Hematology and Oncology

## 2016-08-06 ENCOUNTER — Other Ambulatory Visit: Payer: Self-pay | Admitting: Hematology and Oncology

## 2016-08-06 ENCOUNTER — Encounter: Payer: Self-pay | Admitting: Hematology and Oncology

## 2016-08-06 ENCOUNTER — Inpatient Hospital Stay: Payer: Medicare Other | Attending: Hematology and Oncology

## 2016-08-06 ENCOUNTER — Telehealth: Payer: Self-pay | Admitting: Hematology and Oncology

## 2016-08-06 ENCOUNTER — Other Ambulatory Visit: Payer: Self-pay | Admitting: *Deleted

## 2016-08-06 VITALS — BP 182/79 | HR 72 | Temp 97.0°F | Ht 64.0 in | Wt 132.3 lb

## 2016-08-06 DIAGNOSIS — Z79899 Other long term (current) drug therapy: Secondary | ICD-10-CM | POA: Diagnosis not present

## 2016-08-06 DIAGNOSIS — Z853 Personal history of malignant neoplasm of breast: Secondary | ICD-10-CM | POA: Diagnosis not present

## 2016-08-06 DIAGNOSIS — E119 Type 2 diabetes mellitus without complications: Secondary | ICD-10-CM | POA: Diagnosis not present

## 2016-08-06 DIAGNOSIS — Z170 Estrogen receptor positive status [ER+]: Secondary | ICD-10-CM | POA: Insufficient documentation

## 2016-08-06 DIAGNOSIS — Z9011 Acquired absence of right breast and nipple: Secondary | ICD-10-CM | POA: Diagnosis not present

## 2016-08-06 DIAGNOSIS — E7800 Pure hypercholesterolemia, unspecified: Secondary | ICD-10-CM | POA: Diagnosis not present

## 2016-08-06 DIAGNOSIS — R001 Bradycardia, unspecified: Secondary | ICD-10-CM

## 2016-08-06 DIAGNOSIS — Z9221 Personal history of antineoplastic chemotherapy: Secondary | ICD-10-CM | POA: Diagnosis not present

## 2016-08-06 DIAGNOSIS — Z9223 Personal history of estrogen therapy: Secondary | ICD-10-CM

## 2016-08-06 DIAGNOSIS — K219 Gastro-esophageal reflux disease without esophagitis: Secondary | ICD-10-CM | POA: Insufficient documentation

## 2016-08-06 DIAGNOSIS — Z8509 Personal history of malignant neoplasm of other digestive organs: Secondary | ICD-10-CM | POA: Insufficient documentation

## 2016-08-06 DIAGNOSIS — Z7982 Long term (current) use of aspirin: Secondary | ICD-10-CM | POA: Insufficient documentation

## 2016-08-06 DIAGNOSIS — I10 Essential (primary) hypertension: Secondary | ICD-10-CM | POA: Diagnosis not present

## 2016-08-06 DIAGNOSIS — Z7984 Long term (current) use of oral hypoglycemic drugs: Secondary | ICD-10-CM | POA: Insufficient documentation

## 2016-08-06 DIAGNOSIS — R599 Enlarged lymph nodes, unspecified: Secondary | ICD-10-CM

## 2016-08-06 DIAGNOSIS — C49A0 Gastrointestinal stromal tumor, unspecified site: Secondary | ICD-10-CM

## 2016-08-06 LAB — CBC WITH DIFFERENTIAL/PLATELET
Basophils Absolute: 0.1 10*3/uL (ref 0–0.1)
Basophils Relative: 1 %
Eosinophils Absolute: 0.1 10*3/uL (ref 0–0.7)
Eosinophils Relative: 2 %
HCT: 36.5 % (ref 35.0–47.0)
Hemoglobin: 12.2 g/dL (ref 12.0–16.0)
Lymphocytes Relative: 29 %
Lymphs Abs: 2.3 10*3/uL (ref 1.0–3.6)
MCH: 31.8 pg (ref 26.0–34.0)
MCHC: 33.4 g/dL (ref 32.0–36.0)
MCV: 95.2 fL (ref 80.0–100.0)
Monocytes Absolute: 0.5 10*3/uL (ref 0.2–0.9)
Monocytes Relative: 6 %
Neutro Abs: 5 10*3/uL (ref 1.4–6.5)
Neutrophils Relative %: 62 %
Platelets: 204 10*3/uL (ref 150–440)
RBC: 3.84 MIL/uL (ref 3.80–5.20)
RDW: 13.4 % (ref 11.5–14.5)
WBC: 8 10*3/uL (ref 3.6–11.0)

## 2016-08-06 LAB — COMPREHENSIVE METABOLIC PANEL
ALT: 15 U/L (ref 14–54)
AST: 22 U/L (ref 15–41)
Albumin: 4.2 g/dL (ref 3.5–5.0)
Alkaline Phosphatase: 62 U/L (ref 38–126)
Anion gap: 9 (ref 5–15)
BUN: 15 mg/dL (ref 6–20)
CO2: 27 mmol/L (ref 22–32)
Calcium: 9.5 mg/dL (ref 8.9–10.3)
Chloride: 104 mmol/L (ref 101–111)
Creatinine, Ser: 0.72 mg/dL (ref 0.44–1.00)
GFR calc Af Amer: >60 mL/min (ref >60)
GFR calc non Af Amer: >60 mL/min (ref >60)
Glucose, Bld: 119 mg/dL — ABNORMAL HIGH (ref 65–99)
Potassium: 4.8 mmol/L (ref 3.5–5.1)
Sodium: 140 mmol/L (ref 135–145)
Total Bilirubin: 0.7 mg/dL (ref 0.3–1.2)
Total Protein: 7.6 g/dL (ref 6.5–8.1)

## 2016-08-06 NOTE — Progress Notes (Signed)
[facility] day:  08/06/16  Chief Complaint: Rachael Castro is a 82 y.o. female with a history of right breast cancer (1999) and gastrointestinal stromal tumor (2013) who is seen for review of imaging and 2 month assessment.  HPI: The patient was last seen in the medical oncology clinic on 06/04/2016.  At that time, she felt dizzy if she got up quickly.  Pulse was 37.  She was referred to the acute care clinic.  We discussed follow-up imaging regarding her persistently elevated CA27.29.  Chest, abdomen, and pelvic CT scan with contrast on 06/17/2016 revealed a new solitary mildly enlarged 1.0 cm left axillary lymph node, nonspecific, nodal metastasis not excluded. This lymph node may be amenable ultrasound-guided biopsy as clinically warranted.  There was no additional potential findings of metastatic disease in the chest, abdomen or pelvis.  There was no evidence of local tumor recurrence in the stomach.  She has a left screening mammogram scheduled for tomorrow.  Symptomatically, the patient denies any complaints. She notes that sometime she still gets dizzy and has to sit for a little while.  She saw Dr. Caryl Comes about 2 weeks ago.  She had an echo as well as a treadmill. She denies any breast concerns   Past Medical History:  Diagnosis Date  . b 1999   breast cancer  . Diabetes mellitus   . GERD (gastroesophageal reflux disease)   . Hypercholesteremia   . Hypertension   . Kidney stones   . 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 Neuromuscular disorder (HCC)    hx shingles  . Personal history of colonic polyps   . Thyroid disease     Past Surgical History:  Procedure Laterality Date  . ABDOMINAL HYSTERECTOMY    . APPENDECTOMY    . BREAST SURGERY Right 1999   mastectomy  . CATARACT EXTRACTION W/PHACO Left 04/30/2015   Procedure: CATARACT EXTRACTION PHACO AND INTRAOCULAR LENS PLACEMENT (IOC);  Surgeon:  Estill Cotta, MD;  Location: [facility];  Service: Ophthalmology;  Laterality: Left;  [place] 01:39 AP% 23.3 CDE 40.42 fluid pack lot # [id number]  . COLONOSCOPY  2008   Sankar  . EUS  02/12/2012   Procedure: UPPER ENDOSCOPIC ULTRASOUND (EUS) LINEAR;  Surgeon: Milus Banister, MD;  Location: [facility];  Service: Endoscopy;  Laterality: N/A;  radial linear  . EYE SURGERY Right 2013   cataract  . HERNIA REPAIR  2012  . LAPAROTOMY  2013   excision of gastric wall mass   . MASTECTOMY    . SALPINGOOPHORECTOMY Right 1979  . THYROIDECTOMY  1978  . TONSILLECTOMY    . TUBAL LIGATION    . UPPER GI ENDOSCOPY  2013    Family History  Problem Relation Age of Onset  . Cancer Mother   . Cancer Father     Social History:  reports that she has never smoked. She has never used smokeless tobacco. She reports that she does not drink alcohol or use drugs.  The patient is alone today.  Allergies: No Known Allergies  Current Medications: Current Outpatient Prescriptions  Medication Sig Dispense Refill  . aspirin 81 MG tablet Take 81 mg by mouth daily.    Marland Kitchen atorvastatin (LIPITOR) 40 MG tablet Take 1 tablet (40 mg total) by mouth at bedtime. 30 tablet 6  . calcium citrate-vitamin D 200-200 MG-UNIT TABS Take 1 tablet by mouth daily.    . DULoxetine (  CYMBALTA) 30 MG capsule Limit 1-2 tabs by mouth per day if tolerated 150 capsule 0  . Gabapentin, Once-Daily, 300 MG TABS Limit one tab by mouth per day or twice a day if tolerated 180 tablet 0  . lidocaine (LIDODERM) 5 % Apply 1 - 2  patches to the right lower extremity in the area of pain for 12 hours then remove for 12 hours for treatment of postherpetic neuralgia and repeat process if tolerated 60 patch 2  . losartan (COZAAR) 25 MG tablet Take 1 tablet (25 mg total) by mouth daily. 30 tablet 6  . Multiple Vitamin (MULTIVITAMIN) tablet Take 1 tablet by mouth daily.    Marland Kitchen omeprazole (PRILOSEC) 40 MG capsule Take 1 capsule (40 mg total) by mouth daily. 30  capsule 5  . sitaGLIPtin-metformin (JANUMET) 50-500 MG tablet Take 1 tablet by mouth 2 (two) times daily with a meal. 60 tablet 5  . thiamine (VITAMIN B-1) 100 MG tablet Take 100 mg by mouth daily.     No current facility-administered medications for this visit.     Review of Systems:  GENERAL:  Feels "ok".  No fevers or sweats.  Weight down 2 pounds. PERFORMANCE STATUS (ECOG):  1 HEENT:  No visual changes, runny nose, sore throat, mouth sores or tenderness. Lungs: No shortness of breath or cough.  No hemoptysis. Cardiac:  No chest pain, palpitations, orthopnea, or PND.  Bradycardia noted at last visit.  Recent echo and treadmill. GI:  No nausea, vomiting, diarrhea, constipation, melena or hematochezia. GU:  No urgency, frequency, dysuria, or hematuria.  Urology follows. Musculoskeletal:  No back pain.  No joint pain.  No muscle tenderness. Extremities:  No pain or swelling. Skin:  No rashes or skin changes. Neuro:  Dizzy if gets up quickly.  No headache, numbness or weakness, balance or coordination issues. Endocrine:  No diabetes, thyroid issues, hot flashes or night sweats. Psych:  No mood changes, depression or anxiety. Pain:  No focal pain. Review of systems:  All other systems reviewed and found to be negative.  Physical Exam: Blood pressure (!) 182/79, pulse 72, temperature 97 F (36.1 C), temperature source Tympanic, height 5' 4" (1.626 m), weight 132 lb 4.4 oz (60 kg). GENERAL:  Well developed, well nourished, woman sitting comfortably in the exam room in no acute distress. MENTAL STATUS:  Alert and oriented to person, place and time. HEAD:  Short curly gray hair.  Normocephalic, atraumatic, face symmetric, no Cushingoid features. EYES:  Glasses.  Brown eyes s/p left cataract removal.  Pupils equal round and reactive to light and accomodation.  No conjunctivitis or scleral icterus. ENT:  Oropharynx clear without lesion.  Dentures.  Tongue normal. Mucous membranes moist.   RESPIRATORY:  Clear to auscultation without rales, wheezes or rhonchi. CARDIOVASCULAR: Regular rate and rhythm without murmur, rub or gallop. BREAST:  Right mastectomy.  No erythema or nodularity.  Left breast with inverted nipple.  Fibrocystic changes.  No masses, skin changes or nipple discharge. ABDOMEN:  Soft, non-tender, with active bowel sounds, and no hepatosplenomegaly.  No masses. SKIN: No rashes, ulcers or skin changes. EXTREMITIES:  No edema, skin discoloration or tenderness.  No palpable cords. LYMPH NODES: No palpable cervical, supraclavicular, axillary or inguinal adenopathy  NEUROLOGICAL: Unremarkable. PSYCH:  Appropriate.   Appointment on 08/06/2016  Component Date Value Ref Range Status  . WBC 08/06/2016 8.0  3.6 - 11.0 K/uL Final  . RBC 08/06/2016 3.84  3.80 - 5.20 MIL/uL Final  . Hemoglobin 08/06/2016 12.2  12.0 - 16.0 g/dL Final  . HCT 08/06/2016 36.5  35.0 - 47.0 % Final  . MCV 08/06/2016 95.2  80.0 - 100.0 fL Final  . MCH 08/06/2016 31.8  26.0 - 34.0 pg Final  . MCHC 08/06/2016 33.4  32.0 - 36.0 g/dL Final  . RDW 08/06/2016 13.4  11.5 - 14.5 % Final  . Platelets 08/06/2016 204  150 - 440 K/uL Final  . Neutrophils Relative % 08/06/2016 62  % Final  . Neutro Abs 08/06/2016 5.0  1.4 - 6.5 K/uL Final  . Lymphocytes Relative 08/06/2016 29  % Final  . Lymphs Abs 08/06/2016 2.3  1.0 - 3.6 K/uL Final  . Monocytes Relative 08/06/2016 6  % Final  . Monocytes Absolute 08/06/2016 0.5  0.2 - 0.9 K/uL Final  . Eosinophils Relative 08/06/2016 2  % Final  . Eosinophils Absolute 08/06/2016 0.1  0 - 0.7 K/uL Final  . Basophils Relative 08/06/2016 1  % Final  . Basophils Absolute 08/06/2016 0.1  0 - 0.1 K/uL Final  . Sodium 08/06/2016 140  135 - 145 mmol/L Final  . Potassium 08/06/2016 4.8  3.5 - 5.1 mmol/L Final  . Chloride 08/06/2016 104  101 - 111 mmol/L Final  . CO2 08/06/2016 27  22 - 32 mmol/L Final  . Glucose, Bld 08/06/2016 119* 65 - 99 mg/dL Final  . BUN 08/06/2016  15  6 - 20 mg/dL Final  . Creatinine, Ser 08/06/2016 0.72  0.44 - 1.00 mg/dL Final  . Calcium 08/06/2016 9.5  8.9 - 10.3 mg/dL Final  . Total Protein 08/06/2016 7.6  6.5 - 8.1 g/dL Final  . Albumin 08/06/2016 4.2  3.5 - 5.0 g/dL Final  . AST 08/06/2016 22  15 - 41 U/L Final  . ALT 08/06/2016 15  14 - 54 U/L Final  . Alkaline Phosphatase 08/06/2016 62  38 - 126 U/L Final  . Total Bilirubin 08/06/2016 0.7  0.3 - 1.2 mg/dL Final  . GFR calc non Af Amer 08/06/2016 >60  >60 mL/min Final  . GFR calc Af Amer 08/06/2016 >60  >60 mL/min Final   Comment: (NOTE) The eGFR has been calculated using the CKD EPI equation. This calculation has not been validated in all clinical situations. eGFR's persistently <60 mL/min signify possible Chronic Kidney Disease.   Georgiann Hahn gap 08/06/2016 9  5 - 15 Final    Assessment:  Dyllan Kats is a 83 y.o. female with a history of right breast cancer status post modified radical mastectomy in 1999 followed by 4 cycles of Adriamycin and Cytoxan. She received tamoxifen then extended adjuvant therapy with Femara.    Left sided mammogram on 07/19/2015 revealed no evidence of malignancy.    CA27.29 was 46.6 on 05/30/2015, 41.9 on 07/09/2015, 42.9 on 11/05/2015, 46.2 on 12/05/2015, 53.5 on 06/04/2016, and 50.0 on 08/06/2016.  She has a history of gastrointestinal stromal tumor (GIST) status post resection on 02/27/2012. Pathology revealed a 6.4 cm tumor. She received a year of [place] beginning 03/2012. Her dose was reduced secondary to side effects.  Abdominal and pelvic CT scan on 04/17/2015 revealed postsurgical changes related to the prior gastric resection. There was no evidence of recurrent or metastatic disease. There were multiple nonobstructing bilateral renal calculi measuring up to 2 mm.  Abdominal and pelvic CT scan on 12/03/2015 revealed a stable exam with no evidence of recurrent disease.  There were tiny nonobstructive intrarenal calculi without evidence  of hydronephrosis.  Chest, abdomen, and pelvic CT scan with  contrast on 06/17/2016 revealed a new solitary mildly enlarged 1.0 cm left axillary lymph node, nonspecific, nodal metastasis not excluded. This lymph node may be amenable ultrasound-guided biopsy as clinically warranted.  There was no additional potential findings of metastatic disease in the chest, abdomen or pelvis.  There was no evidence of local tumor recurrence in the stomach.  Symptomatically, she feels "fine".  She denies any breast or abdominal symptoms.  Exam reveals fibrocystic changes in the left breast and no apparent axillary adenopathy.  Plan: 1. Labs today:  CBC with diff, CMP, CA27.29. 2. Review chest, abdomen, and pelvic CT scan.  Copy for patient. 3. Left sided mammogram tomorrow.  Ultrasound added to assess lymph node noted on 06/17/2016 CT scan. 4. RTC in 6 months for MD assess, labs (CBC with diff, CMP, LDH, CA27.29), and review of mammogram.   Lequita Asal, MD  08/06/2016, 11:52 AM

## 2016-08-06 NOTE — Telephone Encounter (Signed)
I contacted [facility] and asked them to add on Ultrasound for tomorrow's mammogram. They said this needs to be changed to a diagnostic of left breast only and said the code is [id number]. They said they will not be able to do it tomorrow. They also said after her CT back in August the radiologist recommended she have a CT guided biopsy of that area and if you will send the order they will try to do it at the same time as her mammogram. Please change and add additional orders. I will contact patient to inform her not to go tomorrow and to wait for another call from [place] with appointment. Thank you.

## 2016-08-06 NOTE — Telephone Encounter (Signed)
Printed message and gave to MD to change.

## 2016-08-06 NOTE — Progress Notes (Signed)
Patient here for follow up. No clinical concerns today.

## 2016-08-07 ENCOUNTER — Ambulatory Visit: Payer: Medicare Other

## 2016-08-07 LAB — CANCER ANTIGEN 27.29: CA 27.29: 50 U/mL — ABNORMAL HIGH (ref 0.0–38.6)

## 2016-08-09 DIAGNOSIS — C49A0 Gastrointestinal stromal tumor, unspecified site: Secondary | ICD-10-CM | POA: Insufficient documentation

## 2016-08-11 ENCOUNTER — Encounter: Payer: Medicare Other | Admitting: Family Medicine

## 2016-08-13 NOTE — Telephone Encounter (Signed)
Pt called today to schedule mammo again but when I called to set it up [facility] at [facility] said they still do not see the orders in for the Ultrasound guided BX of left breast that was requested by radiologist in August. They want to try to do this at the same time as mammo and won't schedule mammo until orders are in. The number you need to use is [id number]. Please order so we can schedule mammo. Thank you!

## 2016-08-14 ENCOUNTER — Other Ambulatory Visit: Payer: Self-pay | Admitting: Hematology and Oncology

## 2016-08-14 NOTE — Telephone Encounter (Signed)
  Let's talk about this.  M

## 2016-08-15 ENCOUNTER — Other Ambulatory Visit: Payer: Self-pay | Admitting: *Deleted

## 2016-08-15 DIAGNOSIS — Z853 Personal history of malignant neoplasm of breast: Secondary | ICD-10-CM

## 2016-08-15 DIAGNOSIS — R9389 Abnormal findings on diagnostic imaging of other specified body structures: Secondary | ICD-10-CM

## 2016-08-15 NOTE — Telephone Encounter (Signed)
I did speak to Dr Mike Gip about this case.  The pt had ct scan 8/29 that showed a lymph node of left axillary.  Her previous breast cancer was right breast.  The report stated bx rec: if clinically inidcated.  Dr. Mike Gip could not feel any lymph node at all and she did spend a lot of time examing the axilla area.  Dr. Mike Gip is ok with going through with bx if medically needed but only if it is dr Mike Gip wants to talk with md before proceeding and I did put order in that way.  She states that she was due to come in 10/30 at 11 or 11:30 and she is not sure if she should still come in.  I told her that I would check Monday am when I get to her work and call her at home and let her know and she is agreeable to the plan

## 2016-08-18 ENCOUNTER — Telehealth: Payer: Self-pay | Admitting: *Deleted

## 2016-08-18 NOTE — Telephone Encounter (Signed)
I spoke to Rachael Castro and she felt like she had an appt fo rmammogram today so after I explained to her that when she had the scan it said biopsy if clinically warranted dr Mike Gip saw her and felt nothing under her arm. She did not want to enter an order for bx.  But I spoke to [place] and she is agreeable to an order only if the radiologist calls [facility] before porceeding with bx if needed.  When I called mammogram they did not have her own the schedule and told me that Rachael Castro would have to call to make the appt.  I called Rachael Castro back to let her know and she got a letter in the mail that has the number and she will call the dept to get it set up

## 2016-08-19 ENCOUNTER — Ambulatory Visit: Payer: Medicare Other | Admitting: General Surgery

## 2016-09-01 ENCOUNTER — Ambulatory Visit (INDEPENDENT_AMBULATORY_CARE_PROVIDER_SITE_OTHER): Payer: Medicare Other | Admitting: Family Medicine

## 2016-09-01 ENCOUNTER — Encounter: Payer: Self-pay | Admitting: Family Medicine

## 2016-09-01 DIAGNOSIS — Z5181 Encounter for therapeutic drug level monitoring: Secondary | ICD-10-CM | POA: Diagnosis not present

## 2016-09-01 DIAGNOSIS — C49A0 Gastrointestinal stromal tumor, unspecified site: Secondary | ICD-10-CM | POA: Diagnosis not present

## 2016-09-01 DIAGNOSIS — I10 Essential (primary) hypertension: Secondary | ICD-10-CM

## 2016-09-01 DIAGNOSIS — E782 Mixed hyperlipidemia: Secondary | ICD-10-CM | POA: Diagnosis not present

## 2016-09-01 DIAGNOSIS — E1165 Type 2 diabetes mellitus with hyperglycemia: Secondary | ICD-10-CM | POA: Diagnosis not present

## 2016-09-01 LAB — LIPID PANEL
CHOL/HDL RATIO: 3.6 ratio (ref <5.0)
Cholesterol: 193 mg/dL (ref <200)
HDL: 54 mg/dL (ref >50)
LDL Cholesterol: 117 mg/dL — ABNORMAL HIGH (ref <100)
Triglycerides: 109 mg/dL (ref <150)
VLDL: 22 mg/dL (ref <30)

## 2016-09-01 LAB — HEMOGLOBIN A1C
HEMOGLOBIN A1C: 6.4 % — AB (ref <5.7)
MEAN PLASMA GLUCOSE: 137 mg/dL

## 2016-09-01 NOTE — Assessment & Plan Note (Signed)
Recheck level; patient is certainly better symptomatically

## 2016-09-01 NOTE — Progress Notes (Signed)
BP 116/60   Pulse 81   Temp 98.2 F (36.8 C) (Oral)   Resp 14   Wt 133 lb (60.3 kg)   SpO2 94%   BMI 22.83 kg/m    Subjective:    Patient ID: Rachael Castro, female    DOB: [birth year], 82 y.o.   MRN: [id number]  HPI: Rachael Castro is a 82 y.o. female  Chief Complaint  Patient presents with  . Follow-up   She was at the cancer doctor and they said her heart rate was slow; they rechecked it; then sent her for EKG there and they sent her by ambulance to the ER and they gave her magnesium; then she saw Dr. Caryl Comes; he put her through all these tests and she passed them; she is usually an outdoors lady and couldn't do it in the summer; she feels good and has her energy coming back; chart reviewed; her Mg2+ was 1.4  Type 2 diabetes; checks 1x a day; 122 this morning; lowest sugar in the last month was 120; highest sugar was 138; sometimes gets dry mouth, but keeps water with her; no blurred vision Urine microalbumin:creatinine in July was normal Last A1c was high at 9.1 in July  High cholesterol; not eating much red meat; loves her chicken; eats sausage 1-2x a month; last total chol 213; last LDL 129   HTN; she quit adding salt at the table  Depression screen [facility] 2/9 09/01/2016 04/29/2016 04/08/2016 01/08/2016 10/23/2015  Decreased Interest 0 0 0 0 0  Down, Depressed, Hopeless 0 0 0 0 0  PHQ - 2 Score 0 0 0 0 0   Relevant past medical, surgical, family and social history reviewed Past Medical History:  Diagnosis Date  . b 1999   breast cancer  . Diabetes mellitus   . GERD (gastroesophageal reflux disease)   . Hypercholesteremia   . Hypertension   . Kidney stones   . 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 Neuromuscular disorder (HCC)    hx shingles  . Personal history of colonic polyps   . Thyroid disease    Past Surgical History:  Procedure Laterality Date  . ABDOMINAL HYSTERECTOMY    . APPENDECTOMY    . BREAST SURGERY Right 1999   mastectomy  . CATARACT EXTRACTION W/PHACO Left 04/30/2015   Procedure: CATARACT EXTRACTION PHACO AND INTRAOCULAR LENS PLACEMENT (IOC);  Surgeon: Estill Cotta, MD;  Location: [facility] ORS;  Service: Ophthalmology;  Laterality: Left;  [place] 01:39 AP% 23.3 CDE 40.42 fluid pack lot # [id number]  . COLONOSCOPY  2008   Sankar  . EUS  02/12/2012   Procedure: UPPER ENDOSCOPIC ULTRASOUND (EUS) LINEAR;  Surgeon: Milus Banister, MD;  Location: [facility];  Service: Endoscopy;  Laterality: N/A;  radial linear  . EYE SURGERY Right 2013   cataract  . HERNIA REPAIR  2012  . LAPAROTOMY  2013   excision of gastric wall mass   . MASTECTOMY    . SALPINGOOPHORECTOMY Right 1979  . THYROIDECTOMY  1978  . TONSILLECTOMY    . TUBAL LIGATION    . UPPER GI ENDOSCOPY  2013   Family History  Problem Relation Age of Onset  . Cancer Mother   . Cancer Father    Social History  Substance Use Topics  . Smoking status: Never Smoker  . Smokeless tobacco: Never Used  . Alcohol use No   Interim medical history since last visit reviewed. Allergies and medications  reviewed  Review of Systems Per HPI unless specifically indicated above     Objective:    BP 116/60   Pulse 81   Temp 98.2 F (36.8 C) (Oral)   Resp 14   Wt 133 lb (60.3 kg)   SpO2 94%   BMI 22.83 kg/m   Wt Readings from Last 3 Encounters:  09/01/16 133 lb (60.3 kg)  08/06/16 132 lb 4.4 oz (60 kg)  06/12/16 128 lb 4 oz (58.2 kg)    Physical Exam  Constitutional: She appears well-developed and well-nourished. No distress.  She appears a decade younger than her stated age  HENT:  Head: Normocephalic and atraumatic.  Eyes: EOM are normal. No scleral icterus.  Neck: No thyromegaly present.  Cardiovascular: Normal rate, regular rhythm and normal heart sounds.   No murmur heard. Pulmonary/Chest: Effort normal and breath sounds normal. No respiratory distress. She has no wheezes.  Abdominal: Soft. Bowel sounds are normal. She exhibits no  distension.  Musculoskeletal: Normal range of motion. She exhibits no edema.  Neurological: She is alert. She exhibits normal muscle tone.  Skin: Skin is warm and dry. She is not diaphoretic. No pallor.  Psychiatric: She has a normal mood and affect. Her behavior is normal. Judgment and thought content normal.   Diabetic Foot Form - Detailed   Diabetic Foot Exam - detailed Diabetic Foot exam was performed with the following findings:  Yes 09/01/2016 10:24 AM  Visual Foot Exam completed.:  Yes  Are the toenails long?:  No Are the toenails thick?:  No Are the toenails ingrown?:  No Normal Range of Motion:  Yes Pulse Foot Exam completed.:  Yes  Right Dorsalis Pedis:  Present Left Dorsalis Pedis:  Present  Sensory Foot Exam Completed.:  Yes Swelling:  No Semmes-Weinstein Monofilament Test R Site 1-Great Toe:  Pos L Site 1-Great Toe:  Pos  R Site 4:  Pos L Site 4:  Pos  R Site 5:  Pos L Site 5:  Pos    Comments:  Callus 4th toe pad right foot    Results for orders placed or performed in visit on 08/06/16  CBC with Differential  Result Value Ref Range   WBC 8.0 3.6 - 11.0 K/uL   RBC 3.84 3.80 - 5.20 MIL/uL   Hemoglobin 12.2 12.0 - 16.0 g/dL   HCT 36.5 35.0 - 47.0 %   MCV 95.2 80.0 - 100.0 fL   MCH 31.8 26.0 - 34.0 pg   MCHC 33.4 32.0 - 36.0 g/dL   RDW 13.4 11.5 - 14.5 %   Platelets 204 150 - 440 K/uL   Neutrophils Relative % 62 %   Neutro Abs 5.0 1.4 - 6.5 K/uL   Lymphocytes Relative 29 %   Lymphs Abs 2.3 1.0 - 3.6 K/uL   Monocytes Relative 6 %   Monocytes Absolute 0.5 0.2 - 0.9 K/uL   Eosinophils Relative 2 %   Eosinophils Absolute 0.1 0 - 0.7 K/uL   Basophils Relative 1 %   Basophils Absolute 0.1 0 - 0.1 K/uL  Comprehensive metabolic panel  Result Value Ref Range   Sodium 140 135 - 145 mmol/L   Potassium 4.8 3.5 - 5.1 mmol/L   Chloride 104 101 - 111 mmol/L   CO2 27 22 - 32 mmol/L   Glucose, Bld 119 (H) 65 - 99 mg/dL   BUN 15 6 - 20 mg/dL   Creatinine, Ser 0.72 0.44  - 1.00 mg/dL   Calcium 9.5 8.9 - 10.3  mg/dL   Total Protein 7.6 6.5 - 8.1 g/dL   Albumin 4.2 3.5 - 5.0 g/dL   AST 22 15 - 41 U/L   ALT 15 14 - 54 U/L   Alkaline Phosphatase 62 38 - 126 U/L   Total Bilirubin 0.7 0.3 - 1.2 mg/dL   GFR calc non Af Amer >60 >60 mL/min   GFR calc Af Amer >60 >60 mL/min   Anion gap 9 5 - 15  Cancer antigen 27.29  Result Value Ref Range   CA 27.29 50.0 (H) 0.0 - 38.6 U/mL      Assessment & Plan:   Problem List Items Addressed This Visit      Cardiovascular and Mediastinum   Hypertension    Well-controlled; follow DASH guidelines; last urine microalb:Cr normal        Digestive   Gastrointestinal stromal tumor (GIST) ([place])    Followed by oncologist        Endocrine   Type 2 diabetes mellitus ([place])    Foot exam by MD; patient will get flu shot at pharmacy; eye exam yearly; avoid white bread, sugary drinks; check A1c today; last urine microalb:Cr normal      Relevant Orders   Hemoglobin A1c     Other   Medication monitoring encounter    Reviewed, CMP just done in October; I do not need to repeat      Hypomagnesemia    Recheck level; patient is certainly better symptomatically      Relevant Orders   Magnesium   Hyperlipidemia    Check lipids today; avoid saturated fats      Relevant Orders   Lipid panel      Follow up plan: Return in about 3 months (around 12/04/2016) for fasting labs and visit; verify if Nov 28th is for a complete physical OR Medicare wellness visit.  An after-visit summary was printed and given to the patient at [facility].  Please see the patient instructions which may contain other information and recommendations beyond what is mentioned above in the assessment and plan.  No orders of the defined types were placed in this encounter.   Orders Placed This Encounter  Procedures  . Lipid panel  . Hemoglobin A1c  . Magnesium

## 2016-09-01 NOTE — Assessment & Plan Note (Signed)
Check lipids today; avoid saturated fats 

## 2016-09-01 NOTE — Patient Instructions (Signed)
We'll check labs today Please do see your eye doctor regularly, and have your eyes examined every year (or more often per his or her recommendation) Check your feet every night and let me know right away of any sores, infections, numbness, etc. Try to limit sweets, white bread, white rice, white potatoes Return in 3 months

## 2016-09-01 NOTE — Assessment & Plan Note (Signed)
Well-controlled; follow DASH guidelines; last urine microalb:Cr normal

## 2016-09-01 NOTE — Assessment & Plan Note (Signed)
Foot exam by MD; patient will get flu shot at pharmacy; eye exam yearly; avoid white bread, sugary drinks; check A1c today; last urine microalb:Cr normal

## 2016-09-01 NOTE — Assessment & Plan Note (Signed)
Followed by oncologist. 

## 2016-09-01 NOTE — Assessment & Plan Note (Signed)
Reviewed, CMP just done in October; I do not need to repeat

## 2016-09-02 ENCOUNTER — Other Ambulatory Visit: Payer: Self-pay | Admitting: Hematology and Oncology

## 2016-09-02 ENCOUNTER — Other Ambulatory Visit: Payer: Medicare Other

## 2016-09-02 ENCOUNTER — Ambulatory Visit
Admission: RE | Admit: 2016-09-02 | Discharge: 2016-09-02 | Disposition: A | Discharge status: Home / Self Care | Payer: Medicare Other | Source: Ambulatory Visit | Attending: Hematology and Oncology | Admitting: Hematology and Oncology

## 2016-09-02 DIAGNOSIS — Z853 Personal history of malignant neoplasm of breast: Secondary | ICD-10-CM | POA: Diagnosis present

## 2016-09-02 DIAGNOSIS — R938 Abnormal findings on diagnostic imaging of other specified body structures: Secondary | ICD-10-CM | POA: Insufficient documentation

## 2016-09-02 DIAGNOSIS — R9389 Abnormal findings on diagnostic imaging of other specified body structures: Secondary | ICD-10-CM

## 2016-09-02 DIAGNOSIS — R590 Localized enlarged lymph nodes: Secondary | ICD-10-CM | POA: Insufficient documentation

## 2016-09-02 LAB — MAGNESIUM: Magnesium: 1.5 mg/dL (ref 1.5–2.5)

## 2016-09-03 ENCOUNTER — Ambulatory Visit: Payer: Medicare Other | Admitting: Hematology and Oncology

## 2016-09-03 ENCOUNTER — Other Ambulatory Visit: Payer: Self-pay | Admitting: Hematology and Oncology

## 2016-09-03 DIAGNOSIS — R2232 Localized swelling, mass and lump, left upper limb: Secondary | ICD-10-CM

## 2016-09-04 ENCOUNTER — Ambulatory Visit (INDEPENDENT_AMBULATORY_CARE_PROVIDER_SITE_OTHER): Payer: Medicare Other

## 2016-09-04 ENCOUNTER — Ambulatory Visit
Admission: RE | Admit: 2016-09-04 | Discharge: 2016-09-04 | Disposition: A | Discharge status: Home / Self Care | Payer: Medicare Other | Source: Ambulatory Visit | Attending: Urology | Admitting: Urology

## 2016-09-04 ENCOUNTER — Ambulatory Visit (INDEPENDENT_AMBULATORY_CARE_PROVIDER_SITE_OTHER): Payer: Medicare Other | Admitting: Urology

## 2016-09-04 ENCOUNTER — Encounter: Payer: Self-pay | Admitting: Urology

## 2016-09-04 VITALS — BP 122/77 | HR 76 | Ht 64.0 in | Wt 131.8 lb

## 2016-09-04 DIAGNOSIS — N200 Calculus of kidney: Secondary | ICD-10-CM | POA: Diagnosis not present

## 2016-09-04 DIAGNOSIS — Z23 Encounter for immunization: Secondary | ICD-10-CM | POA: Diagnosis not present

## 2016-09-04 NOTE — Progress Notes (Signed)
09/04/2016 2:11 PM   Rachael Castro 07-21-1931 [id number]  Referring provider: Ashok Norris, MD [address] [place],  [address]  Chief Complaint  Patient presents with  . Nephrolithiasis    1 year follow up     HPI: Patient is an 82 year old African American female who presents today for a one year follow up for nephrolithiasis.    Patient has not had any flank pain, blood in the urine or passage of fragments.  She has no other urinary symptoms at this time.  She has not had any recent fevers, chills, nausea or vomiting.    Her KUB today demonstrates bilateral nephrolithiasis.  I have independently reviewed the films.    PMH: Past Medical History:  Diagnosis Date  . b 1999   breast cancer  . Diabetes mellitus   . GERD (gastroesophageal reflux disease)   . Hypercholesteremia   . Hypertension   . Kidney stones   . 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 Neuromuscular disorder (HCC)    hx shingles  . Personal history of colonic polyps   . Thyroid disease     Surgical History: Past Surgical History:  Procedure Laterality Date  . ABDOMINAL HYSTERECTOMY    . APPENDECTOMY    . BREAST SURGERY Right 1999   mastectomy  . CATARACT EXTRACTION W/PHACO Left 04/30/2015   Procedure: CATARACT EXTRACTION PHACO AND INTRAOCULAR LENS PLACEMENT (IOC);  Surgeon: Estill Cotta, MD;  Location: [facility] ORS;  Service: Ophthalmology;  Laterality: Left;  [place] 01:39 AP% 23.3 CDE 40.42 fluid pack lot # [id number]  . COLONOSCOPY  2008   Sankar  . EUS  02/12/2012   Procedure: UPPER ENDOSCOPIC ULTRASOUND (EUS) LINEAR;  Surgeon: Milus Banister, MD;  Location: [facility];  Service: Endoscopy;  Laterality: N/A;  radial linear  . EYE SURGERY Right 2013   cataract  . HERNIA REPAIR  2012  . LAPAROTOMY  2013   excision of gastric wall mass   . MASTECTOMY    . SALPINGOOPHORECTOMY Right 1979  . THYROIDECTOMY  1978  . TONSILLECTOMY    .  TUBAL LIGATION    . UPPER GI ENDOSCOPY  2013    Home Medications:    Medication List       Accurate as of 09/04/16  2:11 PM. Always use your most recent med list.          aspirin 81 MG tablet Take 81 mg by mouth daily.   atorvastatin 40 MG tablet Commonly known as:  LIPITOR Take 1 tablet (40 mg total) by mouth at bedtime.   calcium citrate-vitamin D 200-200 MG-UNIT Tabs Take 1 tablet by mouth daily.   Gabapentin (Once-Daily) 300 MG Tabs Limit one tab by mouth per day or twice a day if tolerated   lidocaine 5 % Commonly known as:  LIDODERM Apply 1 - 2  patches to the right lower extremity in the area of pain for 12 hours then remove for 12 hours for treatment of postherpetic neuralgia and repeat process if tolerated   losartan 25 MG tablet Commonly known as:  COZAAR Take 1 tablet (25 mg total) by mouth daily.   multivitamin tablet Take 1 tablet by mouth daily.   omeprazole 40 MG capsule Commonly known as:  PRILOSEC Take 1 capsule (40 mg total) by mouth daily.   sitaGLIPtin-metformin 50-500 MG tablet Commonly known as:  JANUMET Take 1 tablet by mouth 2 (two) times  daily with a meal.   thiamine 100 MG tablet Commonly known as:  VITAMIN B-1 Take 100 mg by mouth daily.   vitamin B-12 100 MCG tablet Commonly known as:  CYANOCOBALAMIN Take 100 mcg by mouth daily.       Allergies: No Known Allergies  Family History: Family History  Problem Relation Age of Onset  . Cancer Mother   . Cancer Father   . Kidney Stones Brother   . Kidney disease Neg Hx   . Bladder Cancer Neg Hx     Social History:  reports that she has never smoked. She has never used smokeless tobacco. She reports that she does not drink alcohol or use drugs.  ROS: UROLOGY Frequent Urination?: No Hard to postpone urination?: No Burning/pain with urination?: No Get up at night to urinate?: No Leakage of urine?: No Urine stream starts and stops?: No Trouble starting stream?: No Do you  have to strain to urinate?: No Blood in urine?: No Urinary tract infection?: No Sexually transmitted disease?: No Injury to kidneys or bladder?: No Painful intercourse?: No Weak stream?: No Currently pregnant?: No Vaginal bleeding?: No Last menstrual period?: n  Gastrointestinal Nausea?: No Vomiting?: No Indigestion/heartburn?: No Diarrhea?: No Constipation?: No  Constitutional Fever: No Night sweats?: No Weight loss?: No Fatigue?: No  Skin Skin rash/lesions?: No Itching?: No  Eyes Blurred vision?: No Double vision?: No  Ears/Nose/Throat Sore throat?: No Sinus problems?: No  Hematologic/Lymphatic Swollen glands?: No Easy bruising?: No  Cardiovascular Leg swelling?: No Chest pain?: No  Respiratory Cough?: No Shortness of breath?: No  Endocrine Excessive thirst?: No  Musculoskeletal Back pain?: No Joint pain?: No  Neurological Headaches?: No Dizziness?: No  Psychologic Depression?: No Anxiety?: No  Physical Exam: BP 122/77   Pulse 76   Ht 5\' 4"  (1.626 m)   Wt 131 lb 12.8 oz (59.8 kg)   BMI 22.62 kg/m   Constitutional: Well nourished. Alert and oriented, No acute distress. HEENT: [place] AT, moist mucus membranes. Trachea midline, no masses. Cardiovascular: No clubbing, cyanosis, or edema. Respiratory: Normal respiratory effort, no increased work of breathing. GI: Abdomen is soft, non tender, non distended, no abdominal masses. Liver and spleen not palpable.  No hernias appreciated.  Stool sample for occult testing is not indicated.   GU: No CVA tenderness.  No bladder fullness or masses.   Skin: No rashes, bruises or suspicious lesions. Lymph: No cervical or inguinal adenopathy. Neurologic: Grossly intact, no focal deficits, moving all 4 extremities. Psychiatric: Normal mood and affect.  Laboratory Data: Lab Results  Component Value Date   WBC 8.0 08/06/2016   HGB 12.2 08/06/2016   HCT 36.5 08/06/2016   MCV 95.2 08/06/2016   PLT 204  08/06/2016    Lab Results  Component Value Date   CREATININE 0.72 08/06/2016    Lab Results  Component Value Date   HGBA1C 6.4 (H) 09/01/2016    Lab Results  Component Value Date   TSH 1.990 06/18/2015       Component Value Date/Time   CHOL 193 09/01/2016 1038   CHOL 224 (H) 06/18/2015 1210   HDL 54 09/01/2016 1038   HDL 57 06/18/2015 1210   CHOLHDL 3.6 09/01/2016 1038   VLDL 22 09/01/2016 1038   LDLCALC 117 (H) 09/01/2016 1038   LDLCALC 136 (H) 06/18/2015 1210    Lab Results  Component Value Date   AST 22 08/06/2016   Lab Results  Component Value Date   ALT 15 08/06/2016  Pertinent Imaging: CLINICAL DATA:  History of nephrolithiasis  EXAM: ABDOMEN - 1 VIEW  COMPARISON:  CT abdomen and pelvis June 17, 2016  FINDINGS: There is a 1 mm calculus in the mid right kidney. There is a focal calcification overlying the lateral aspect of L2-3 on the left of uncertain etiology. This calcification is unlikely to reside within the urinary system and a reside within bowel. There is postoperative change in the left upper quadrant. There are small phleboliths in the pelvis. There is moderate stool throughout colon. There is no bowel dilatation or air-fluid level suggesting bowel obstruction. No free air.  IMPRESSION: Tiny calculus mid right kidney. Area of calcification in the left mid abdomen medially of uncertain etiology and location. Bowel gas pattern unremarkable.   Electronically Signed   By: Lowella Grip III M.D.   On: 09/04/2016 16:27   Assessment & Plan:    1. Nephrolithiasis  - continue yearly KUB's  - contact us if she experiences flank pain or gross hematuria  - Abdomen 1 view (KUB); Future    Return in about 1 year (around 09/04/2017) for KUB and symptom recheck.  These notes generated with voice recognition software. I apologize for typographical errors.  Zara Council, [facility] [address], [place], [place] [address] [phone]

## 2016-09-08 ENCOUNTER — Ambulatory Visit (INDEPENDENT_AMBULATORY_CARE_PROVIDER_SITE_OTHER): Payer: Medicare Other | Admitting: General Surgery

## 2016-09-08 ENCOUNTER — Encounter: Payer: Self-pay | Admitting: General Surgery

## 2016-09-08 ENCOUNTER — Inpatient Hospital Stay: Payer: Self-pay

## 2016-09-08 VITALS — BP 128/64 | HR 82 | Resp 14 | Ht 64.0 in | Wt 131.0 lb

## 2016-09-08 DIAGNOSIS — R599 Enlarged lymph nodes, unspecified: Secondary | ICD-10-CM

## 2016-09-08 DIAGNOSIS — Z853 Personal history of malignant neoplasm of breast: Secondary | ICD-10-CM | POA: Diagnosis not present

## 2016-09-08 HISTORY — PX: BREAST BIOPSY: SHX20

## 2016-09-08 NOTE — Progress Notes (Signed)
Patient ID: Rachael Castro, female   DOB: [birth year], 82 y.o.   MRN: [id number]  Chief Complaint  Patient presents with  . Follow-up    mammogram    HPI Rachael Castro is a 82 y.o. female with a history of right breast cancer who presents for a breast evaluation. The most recent mammogram was done on 09/02/16. Patient does perform regular self breast checks and gets regular mammograms done. Had CT done in August that showed enlarged left lymph node in the axillary region. Ultrasound done at the time of her mammogram recommended biopsy of the lymph node. She denies any other breast issues.  I have reviewed the history of present illness with the patient.    HPI  Past Medical History:  Diagnosis Date  . b 1999   breast cancer  . Diabetes mellitus   . GERD (gastroesophageal reflux disease)   . Hypercholesteremia   . Hypertension   . Kidney stones   . 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 Neuromuscular disorder (HCC)    hx shingles  . Personal history of colonic polyps   . Thyroid disease     Past Surgical History:  Procedure Laterality Date  . ABDOMINAL HYSTERECTOMY    . APPENDECTOMY    . BREAST SURGERY Right 1999   mastectomy  . CATARACT EXTRACTION W/PHACO Left 04/30/2015   Procedure: CATARACT EXTRACTION PHACO AND INTRAOCULAR LENS PLACEMENT (IOC);  Surgeon: Estill Cotta, MD;  Location: [facility] ORS;  Service: Ophthalmology;  Laterality: Left;  [place] 01:39 AP% 23.3 CDE 40.42 fluid pack lot # [id number]  . COLONOSCOPY  2008   Awab Abebe  . EUS  02/12/2012   Procedure: UPPER ENDOSCOPIC ULTRASOUND (EUS) LINEAR;  Surgeon: Milus Banister, MD;  Location: [facility];  Service: Endoscopy;  Laterality: N/A;  radial linear  . EYE SURGERY Right 2013   cataract  . HERNIA REPAIR  2012  . LAPAROTOMY  2013   excision of gastric wall mass   . MASTECTOMY    . SALPINGOOPHORECTOMY Right 1979  . THYROIDECTOMY  1978  . TONSILLECTOMY    . TUBAL LIGATION     . UPPER GI ENDOSCOPY  2013    Family History  Problem Relation Age of Onset  . Cancer Mother   . Cancer Father   . Kidney Stones Brother   . Kidney disease Neg Hx   . Bladder Cancer Neg Hx     Social History Social History  Substance Use Topics  . Smoking status: Never Smoker  . Smokeless tobacco: Never Used  . Alcohol use No    No Known Allergies  Current Outpatient Prescriptions  Medication Sig Dispense Refill  . aspirin 81 MG tablet Take 81 mg by mouth daily.    Marland Kitchen atorvastatin (LIPITOR) 40 MG tablet Take 1 tablet (40 mg total) by mouth at bedtime. 30 tablet 6  . calcium citrate-vitamin D 200-200 MG-UNIT TABS Take 1 tablet by mouth daily.    . Gabapentin, Once-Daily, 300 MG TABS Limit one tab by mouth per day or twice a day if tolerated 180 tablet 0  . lidocaine (LIDODERM) 5 % Apply 1 - 2  patches to the right lower extremity in the area of pain for 12 hours then remove for 12 hours for treatment of postherpetic neuralgia and repeat process if tolerated 60 patch 2  . losartan (COZAAR) 25 MG tablet Take 1 tablet (25 mg total) by mouth daily. 30 tablet 6  .  Multiple Vitamin (MULTIVITAMIN) tablet Take 1 tablet by mouth daily.    Marland Kitchen omeprazole (PRILOSEC) 40 MG capsule Take 1 capsule (40 mg total) by mouth daily. 30 capsule 5  . sitaGLIPtin-metformin (JANUMET) 50-500 MG tablet Take 1 tablet by mouth 2 (two) times daily with a meal. 60 tablet 5  . thiamine (VITAMIN B-1) 100 MG tablet Take 100 mg by mouth daily.     No current facility-administered medications for this visit.     Review of Systems Review of Systems  Constitutional: Negative.   Respiratory: Negative.   Cardiovascular: Negative.     Blood pressure 128/64, pulse 82, resp. rate 14, height 5\' 4"  (1.626 m), weight 131 lb (59.4 kg).  Physical Exam Physical Exam  Constitutional: She is oriented to person, place, and time. She appears well-developed and well-nourished.  Eyes: Conjunctivae are normal. No scleral  icterus.  Neck: Neck supple.  Cardiovascular: Normal rate, regular rhythm and normal heart sounds.   Pulmonary/Chest: Effort normal and breath sounds normal. Left breast exhibits no inverted nipple, no mass, no nipple discharge, no skin change and no tenderness.  Right mastectomy well healed. No signs of recurrence.  Abdominal: Soft. Bowel sounds are normal. She exhibits no mass. There is no tenderness.  Lymphadenopathy:    She has no cervical adenopathy.    She has no axillary adenopathy.  Neurological: She is alert and oriented to person, place, and time.  Skin: Skin is warm and dry.  Psychiatric: She has a normal mood and affect.    Data Reviewed  Mammogram and ultrasound reviewed [place] of left axilla showed a single large node with mild cortical thickening. Assessment    Right breast cancer in 1999 and GIST in 2013 History of colon polyps. Left axillary node-core biopsy recommended and completed today    Plan   CORE axillary node BIOPSY REPORT  Name:  Rachael Castro DOB:  September 25, 1931  Vital signs:BP 128/64   Pulse 82   Resp 14   Ht 5\' 4"  (1.626 m)   Wt 131 lb (59.4 kg)   BMI 22.49 kg/m   Lesion: enlarged node  Location:  Left   axilla  Local anesthetic:   38ml of 1% Xylocaine/0.5% Marcaine  Prep: Chlora prep  Device: 14Gauge Bard    Ultrasound guidance: used  Patient tolerance:  Patient tolerated the procedure well   Approach:cc   Clip: not used  Dressing:steristrips, telfa and tegaderm Ice pack applied. Written instructions provided to patient regarding wound care.   Patient advised that patient will be contacted by phone when pathology report is available.  Followup appointment to be scheduled after pathology.   CC: Enid Derry, MD, [place]       This has been scribed by Lesly Rubenstein LPN    Christene Lye 09/16/2016, 6:16 AM

## 2016-09-08 NOTE — Patient Instructions (Signed)
CARE AFTER  BIOPSY  1. Leave the dressing on that your doctor applied after the biopsy. It is waterproof. You may bathe, shower and/or swim. The dressing can be removed in 3 days, you will see small strips of tape against your skin on the incision. Do not remove these strips they will gradually fall off in about 2-3 weeks. You may use an ice pack on and off for the first 12-24 hours for comfort.  2. You may want to use a gauze,cloth or similar protection in your bra to prevent rubbing against your dressing and incision. This is not necessary, but you may feel more comfortable doing so.  3. It is recommended that you wear a bra day and night to give support to the breast. This will prevent the weight of the breast from pulling on the incision.  4. Your breast may feel hard and lumpy under the incision. Do not be alarmed. This is the underlying stitching of tissue. Softening of this tissue will occur in time.  5. You may have a follow up appointment or phone follow up in one week after your biopsy. The office phone number is [phone].  6. You will notice about a week or two after your office visit that the strips of the tape on your incision will begin to loosen. These may then be removed.  7. Report to your doctor any of the following:  * Severe pain not relieved by your pain medication  *Redness of the incision  * Drainage from the incision  *Fever greater than 101 degrees  

## 2016-09-16 ENCOUNTER — Encounter: Payer: Self-pay | Admitting: General Surgery

## 2016-09-16 ENCOUNTER — Telehealth: Payer: Self-pay | Admitting: *Deleted

## 2016-09-16 ENCOUNTER — Ambulatory Visit (INDEPENDENT_AMBULATORY_CARE_PROVIDER_SITE_OTHER): Payer: Medicare Other | Admitting: Family Medicine

## 2016-09-16 ENCOUNTER — Encounter: Payer: Self-pay | Admitting: Family Medicine

## 2016-09-16 VITALS — BP 124/56 | HR 80 | Temp 97.8°F | Resp 14 | Wt 132.0 lb

## 2016-09-16 DIAGNOSIS — Z13820 Encounter for screening for osteoporosis: Secondary | ICD-10-CM

## 2016-09-16 DIAGNOSIS — Z0000 Encounter for general adult medical examination without abnormal findings: Secondary | ICD-10-CM

## 2016-09-16 NOTE — Assessment & Plan Note (Signed)
USPSTF grade A and B recommendations reviewed with patient; age-appropriate recommendations, preventive care, screening tests, etc discussed and encouraged; healthy living encouraged; see AVS for patient education given to patient  

## 2016-09-16 NOTE — Assessment & Plan Note (Signed)
Order DEXA; fall precautions, three servings of calcium a day, vit D 1000 iu daily

## 2016-09-16 NOTE — Telephone Encounter (Signed)
Made in error

## 2016-09-16 NOTE — Progress Notes (Signed)
Patient: Rachael Castro, Female    DOB: [birth year], 82 y.o.   MRN: [id number]  Visit Date: 09/16/2016  Today's Provider: Enid Derry, MD   Chief Complaint  Patient presents with  . Annual Exam    Medicare wellness    Subjective:   Rachael Castro is a 82 y.o. female who presents today for her Subsequent Annual Wellness Visit.  Caregiver input:  no  Since last visit, had biopsy; waiting to hear results  USPSTF grade A and B recommendations Alcohol: no Depression:  Depression screen [facility] 2/9 09/16/2016 09/01/2016 04/29/2016 04/08/2016 01/08/2016  Decreased Interest 0 0 0 0 0  Down, Depressed, Hopeless 0 0 0 0 0  PHQ - 2 Score 0 0 0 0 0   Hypertension: well-controlled Obesity: no  Tobacco use: never smoker HIV, hep B, hep C: declined STD testing and prevention (chl/gon/syphilis): declined Lipids: last lipids reviewed Glucose: last A1c dropped from 9.1 to 6.4 Colorectal cancer:  Breast cancer: Dr. Jamal Collin monitors Intimate partner violence: no Cervical cancer screening: s/p hyst Lung cancer: past 34 Osteoporosis: order  Fall prevention/vitamin D: discussed AAA: past age Aspirin: taking 81 mg daily Diet: pretty good eater, meals but not much snacking, loves ice cream Exercise: tries to stay active Skin cancer: nothing worrisome  HPI  Review of Systems  Past Medical History:  Diagnosis Date  . b 1997   breast cancer  . Diabetes mellitus   . GERD (gastroesophageal reflux disease)   . Hypercholesteremia   . Hypertension   . Kidney stones   . 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 Neuromuscular disorder (HCC)    hx shingles  . Personal history of colonic polyps   . Thyroid disease   MD note: had a nodule on her thyroid, went to specialist, monitored, removed the nodule, benign  Past Surgical History:  Procedure Laterality Date  . ABDOMINAL HYSTERECTOMY    . APPENDECTOMY    . BREAST SURGERY Right 1999   mastectomy  .  CATARACT EXTRACTION W/PHACO Left 04/30/2015   Procedure: CATARACT EXTRACTION PHACO AND INTRAOCULAR LENS PLACEMENT (IOC);  Surgeon: Estill Cotta, MD;  Location: [facility] ORS;  Service: Ophthalmology;  Laterality: Left;  [place] 01:39 AP% 23.3 CDE 40.42 fluid pack lot # [id number]  . COLONOSCOPY  2008   Sankar  . EUS  02/12/2012   Procedure: UPPER ENDOSCOPIC ULTRASOUND (EUS) LINEAR;  Surgeon: Milus Banister, MD;  Location: [facility];  Service: Endoscopy;  Laterality: N/A;  radial linear  . EYE SURGERY Right 2013   cataract  . HERNIA REPAIR  2012  . LAPAROTOMY  2013   excision of gastric wall mass   . MASTECTOMY    . SALPINGOOPHORECTOMY Right 1979  . THYROIDECTOMY  1978  . TONSILLECTOMY    . TUBAL LIGATION    . UPPER GI ENDOSCOPY  2013   Family History  Problem Relation Age of Onset  . Cancer Mother   . Cancer Father   . Kidney Stones Brother   . Kidney disease Neg Hx   . Bladder Cancer Neg Hx   Mother had cervical cancer Father had prostate cancer  Social History   Social History  . Marital status: Married    Spouse name: N/A  . Number of children: N/A  . Years of education: N/A   Occupational History  . Not on file.   Social History Main Topics  . Smoking status: Never Smoker  . Smokeless tobacco: Never  Used  . Alcohol use No  . Drug use: No  . Sexual activity: Not on file   Other Topics Concern  . Not on file   Social History Narrative  . No narrative on file    Outpatient Encounter Prescriptions as of 09/16/2016  Medication Sig Note  . aspirin 81 MG tablet Take 81 mg by mouth daily.   Marland Kitchen atorvastatin (LIPITOR) 40 MG tablet Take 1 tablet (40 mg total) by mouth at bedtime.   . calcium citrate-vitamin D 200-200 MG-UNIT TABS Take 1 tablet by mouth daily.   . Gabapentin, Once-Daily, 300 MG TABS Limit one tab by mouth per day or twice a day if tolerated 09/04/2016: PRN  . lidocaine (LIDODERM) 5 % Apply 1 - 2  patches to the right lower extremity in the area of pain  for 12 hours then remove for 12 hours for treatment of postherpetic neuralgia and repeat process if tolerated 09/04/2016: PRN  . losartan (COZAAR) 25 MG tablet Take 1 tablet (25 mg total) by mouth daily.   . Multiple Vitamin (MULTIVITAMIN) tablet Take 1 tablet by mouth daily.   Marland Kitchen omeprazole (PRILOSEC) 40 MG capsule Take 1 capsule (40 mg total) by mouth daily.   . sitaGLIPtin-metformin (JANUMET) 50-500 MG tablet Take 1 tablet by mouth 2 (two) times daily with a meal.   . thiamine (VITAMIN B-1) 100 MG tablet Take 100 mg by mouth daily.    No facility-administered encounter medications on file as of 09/16/2016.     Functional Ability / Safety Screening 1.  Was the timed Get Up and Go test longer than 30 seconds?  no 2.  Does the patient need help with the phone, transportation, shopping,      preparing meals, housework, laundry, medications, or managing money?  no 3.  Does the patient's home have:  loose throw rugs in the hallway?   no      Grab bars in the bathroom? yes      Handrails on the stairs?   no stairs      Poor lighting?   no 4.  Has the patient noticed any hearing difficulties?   no  Fall Risk Assessment See under rooming  Depression Screen See under rooming Depression screen [facility] 2/9 09/16/2016 09/01/2016 04/29/2016 04/08/2016 01/08/2016  Decreased Interest 0 0 0 0 0  Down, Depressed, Hopeless 0 0 0 0 0  PHQ - 2 Score 0 0 0 0 0   Advanced Directives Does patient have a HCPOA?    no If yes, name and contact information: n/a Does patient have a living will or MOST form?  no  Patient to think about those  Objective:   Vitals: BP (!) 124/56   Pulse 80   Temp 97.8 F (36.6 C) (Oral)   Resp 14   Wt 132 lb (59.9 kg)   SpO2 95%   BMI 22.66 kg/m  Body mass index is 22.66 kg/m. No exam data present  Physical Exam  Constitutional: She appears well-developed and well-nourished. No distress.  Cardiovascular: Normal rate and regular rhythm.   Pulmonary/Chest: Effort  normal and breath sounds normal.  Psychiatric: She has a normal mood and affect.   Mood/affect:  euthymic Appearance:  Neatly dressed  Diabetic Foot Form - Detailed   Diabetic Foot Exam - detailed Diabetic Foot exam was performed with the following findings:  Yes 09/16/2016 11:01 AM  Visual Foot Exam completed.:  Yes  Are the toenails ingrown?:  No Normal Range of Motion:  Yes Pulse Foot Exam completed.:  Yes  Right Dorsalis Pedis:  Present Left Dorsalis Pedis:  Present  Sensory Foot Exam Completed.:  Yes Semmes-Weinstein Monofilament Test R Site 1-Great Toe:  Pos L Site 1-Great Toe:  Pos  R Site 4:  Pos L Site 4:  Pos  R Site 5:  Pos L Site 5:  Pos    Comments:  Callus medial right toe, lateral ball of right foot; skin dry, no fissures    Cognitive Testing - 6-CIT  Correct? Score   What year is it? yes 0 Yes = 0    No = 4  What month is it? yes 0 Yes = 0    No = 3  Remember:     [place], [address], [place]     What time is it? yes 0 Yes = 0    No = 3  Count backwards from 20 to 1 Yes, but went to 0 2 Correct = 0    1 error = 2   More than 1 error = 4  Say the months of the year in reverse. yes 0 Correct = 0    1 error = 2   More than 1 error = 4  What address did I ask you to remember? yes 0 Correct = 0  1 error = 2    2 error = 4    3 error = 6    4 error = 8    All wrong = 10       TOTAL SCORE  2/28   Interpretation:  Normal  Normal (0-7) Abnormal (8-28)    Assessment & Plan:     Annual Wellness Visit  Reviewed patient's Family Medical History Reviewed and updated list of patient's medical providers Assessment of cognitive impairment was done Assessed patient's functional ability Established a written schedule for health screening [place] Completed and Reviewed  Exercise Activities and Dietary recommendations Goals    None      Immunization History  Administered Date(s) Administered  . Influenza, High Dose Seasonal PF  06/18/2015, 09/04/2016    Health Maintenance  Topic Date Due  . HEMOGLOBIN A1C  03/01/2017  . OPHTHALMOLOGY EXAM  03/20/2017  . FOOT EXAM  09/01/2017  . TETANUS/TDAP  04/26/2022  . INFLUENZA VACCINE  Completed  . DEXA SCAN  Completed  . ZOSTAVAX  Completed  . PNA vac Low Risk Adult  Completed   Discussed health benefits of physical activity, and encouraged her to engage in regular exercise appropriate for her age and condition.   Problem List Items Addressed This Visit      Other   Screening for osteoporosis    Order DEXA; fall precautions, three servings of calcium a day, vit D 1000 iu daily      Relevant Orders   DG Bone Density   Annual physical exam - Primary    USPSTF grade A and B recommendations reviewed with patient; age-appropriate recommendations, preventive care, screening tests, etc discussed and encouraged; healthy living encouraged; see AVS for patient education given to patient        No orders of the defined types were placed in this encounter.   Current Outpatient Prescriptions:  .  aspirin 81 MG tablet, Take 81 mg by mouth daily., Disp: , Rfl:  .  atorvastatin (LIPITOR) 40 MG tablet, Take 1 tablet (40 mg total) by mouth at bedtime., Disp: 30 tablet, Rfl: 6 .  calcium citrate-vitamin  D 200-200 MG-UNIT TABS, Take 1 tablet by mouth daily., Disp: , Rfl:  .  Gabapentin, Once-Daily, 300 MG TABS, Limit one tab by mouth per day or twice a day if tolerated, Disp: 180 tablet, Rfl: 0 .  lidocaine (LIDODERM) 5 %, Apply 1 - 2  patches to the right lower extremity in the area of pain for 12 hours then remove for 12 hours for treatment of postherpetic neuralgia and repeat process if tolerated, Disp: 60 patch, Rfl: 2 .  losartan (COZAAR) 25 MG tablet, Take 1 tablet (25 mg total) by mouth daily., Disp: 30 tablet, Rfl: 6 .  Multiple Vitamin (MULTIVITAMIN) tablet, Take 1 tablet by mouth daily., Disp: , Rfl:  .  omeprazole (PRILOSEC) 40 MG capsule, Take 1 capsule (40 mg total)  by mouth daily., Disp: 30 capsule, Rfl: 5 .  sitaGLIPtin-metformin (JANUMET) 50-500 MG tablet, Take 1 tablet by mouth 2 (two) times daily with a meal., Disp: 60 tablet, Rfl: 5 .  thiamine (VITAMIN B-1) 100 MG tablet, Take 100 mg by mouth daily., Disp: , Rfl:  There are no discontinued medications.  Next Medicare Wellness Visit in 12+ months

## 2016-09-16 NOTE — Telephone Encounter (Signed)
Patient called back to get results, she understands and is pleased

## 2016-09-16 NOTE — Telephone Encounter (Signed)
-----   Message from Christene Lye, MD sent at 09/16/2016  7:36 AM EST ----- Please inform pt pathology on lymph node was benign.  Place on 1 yr call back with left mammogram

## 2016-09-16 NOTE — Patient Instructions (Addendum)
Think about having a health care power of attorney and living will drawn up Please do call to schedule your bone density study; the number to schedule one at either [facility] or [facility] is [phone]  Health Maintenance, Female Introduction Adopting a healthy lifestyle and getting preventive care can go a long way to promote health and wellness. Talk with your health care provider about what schedule of regular examinations is right for you. This is a good chance for you to check in with your provider about disease prevention and staying healthy. In between checkups, there are plenty of things you can do on your own. Experts have done a lot of research about which lifestyle changes and preventive measures are most likely to keep you healthy. Ask your health care provider for more information. Weight and diet Eat a healthy diet  Be sure to include plenty of vegetables, fruits, low-fat dairy products, and lean protein.  Do not eat a lot of foods high in solid fats, added sugars, or salt.  Get regular exercise. This is one of the most important things you can do for your health.  Most adults should exercise for at least 150 minutes each week. The exercise should increase your heart rate and make you sweat (moderate-intensity exercise).  Most adults should also do strengthening exercises at least twice a week. This is in addition to the moderate-intensity exercise. Maintain a healthy weight  Body mass index (BMI) is a measurement that can be used to identify possible weight problems. It estimates body fat based on height and weight. Your health care provider can help determine your BMI and help you achieve or maintain a healthy weight.  For females 80 years of age and older:  A BMI below 18.5 is considered underweight.  A BMI of 18.5 to 24.9 is normal.  A BMI of 25 to 29.9 is considered overweight.  A BMI of 30 and above is considered obese. Watch  levels of cholesterol and blood lipids  You should start having your blood tested for lipids and cholesterol at 80 years of age, then have this test every 5 years.  You may need to have your cholesterol levels checked more often if:  Your lipid or cholesterol levels are high.  You are older than 80 years of age.  You are at high risk for heart disease. Cancer screening Lung Cancer  Lung cancer screening is recommended for adults 80-93 years old who are at high risk for lung cancer because of a history of smoking.  A yearly low-dose CT scan of the lungs is recommended for people who:  Currently smoke.  Have quit within the past 15 years.  Have at least a 30-pack-year history of smoking. A pack year is smoking an average of one pack of cigarettes a day for 1 year.  Yearly screening should continue until it has been 15 years since you quit.  Yearly screening should stop if you develop a health problem that would prevent you from having lung cancer treatment. Breast Cancer  Practice breast self-awareness. This means understanding how your breasts normally appear and feel.  It also means doing regular breast self-exams. Let your health care provider know about any changes, no matter how small.  If you are in your 80s or 30s, you should have a clinical breast exam (CBE) by a health care provider every 1-3 years as part of a regular health exam.  If you are 80 or older, have a  CBE every year. Also consider having a breast X-ray (mammogram) every year.  If you have a family history of breast cancer, talk to your health care provider about genetic screening.  If you are at high risk for breast cancer, talk to your health care provider about having an MRI and a mammogram every year.  Breast cancer gene (BRCA) assessment is recommended for women who have family members with BRCA-related cancers. BRCA-related cancers include:  Breast.  Ovarian.  Tubal.  Peritoneal  cancers.  Results of the assessment will determine the need for genetic counseling and BRCA1 and BRCA2 testing. Cervical Cancer  Your health care provider may recommend that you be screened regularly for cancer of the pelvic organs (ovaries, uterus, and vagina). This screening involves a pelvic examination, including checking for microscopic changes to the surface of your cervix (Pap test). You may be encouraged to have this screening done every 3 years, beginning at age 80.  For women ages 80-65, health care providers may recommend pelvic exams and Pap testing every 3 years, or they may recommend the Pap and pelvic exam, combined with testing for human papilloma virus (HPV), every 5 years. Some types of HPV increase your risk of cervical cancer. Testing for HPV may also be done on women of any age with unclear Pap test results.  Other health care providers may not recommend any screening for nonpregnant women who are considered low risk for pelvic cancer and who do not have symptoms. Ask your health care provider if a screening pelvic exam is right for you.  If you have had past treatment for cervical cancer or a condition that could lead to cancer, you need Pap tests and screening for cancer for at least 20 years after your treatment. If Pap tests have been discontinued, your risk factors (such as having a new sexual partner) need to be reassessed to determine if screening should resume. Some women have medical problems that increase the chance of getting cervical cancer. In these cases, your health care provider may recommend more frequent screening and Pap tests. Colorectal Cancer  This type of cancer can be detected and often prevented.  Routine colorectal cancer screening usually begins at 80 years of age and continues through 80 years of age.  Your health care provider may recommend screening at an earlier age if you have risk factors for colon cancer.  Your health care provider may also  recommend using home test kits to check for hidden blood in the stool.  A small camera at the end of a tube can be used to examine your colon directly (sigmoidoscopy or colonoscopy). This is done to check for the earliest forms of colorectal cancer.  Routine screening usually begins at age 35.  Direct examination of the colon should be repeated every 5-10 years through 80 years of age. However, you may need to be screened more often if early forms of precancerous polyps or small growths are found. Skin Cancer  Check your skin from head to toe regularly.  Tell your health care provider about any new moles or changes in moles, especially if there is a change in a mole's shape or color.  Also tell your health care provider if you have a mole that is larger than the size of a pencil eraser.  Always use sunscreen. Apply sunscreen liberally and repeatedly throughout the day.  Protect yourself by wearing long sleeves, pants, a wide-brimmed hat, and sunglasses whenever you are outside. Heart disease, diabetes, and high  blood pressure  High blood pressure causes heart disease and increases the risk of stroke. High blood pressure is more likely to develop in:  People who have blood pressure in the high end of the normal range (130-139/85-89 mm Hg).  People who are overweight or obese.  People who are African American.  If you are 31-76 years of age, have your blood pressure checked every 3-5 years. If you are 18 years of age or older, have your blood pressure checked every year. You should have your blood pressure measured twice-once when you are at a hospital or clinic, and once when you are not at a hospital or clinic. Record the average of the two measurements. To check your blood pressure when you are not at a hospital or clinic, you can use:  An automated blood pressure machine at a pharmacy.  A home blood pressure monitor.  If you are between 69 years and 79 years old, ask your health  care provider if you should take aspirin to prevent strokes.  Have regular diabetes screenings. This involves taking a blood sample to check your fasting blood sugar level.  If you are at a normal weight and have a low risk for diabetes, have this test once every three years after 80 years of age.  If you are overweight and have a high risk for diabetes, consider being tested at a younger age or more often. Preventing infection Hepatitis B  If you have a higher risk for hepatitis B, you should be screened for this virus. You are considered at high risk for hepatitis B if:  You were born in a country where hepatitis B is common. Ask your health care provider which countries are considered high risk.  Your parents were born in a high-risk country, and you have not been immunized against hepatitis B (hepatitis B vaccine).  You have HIV or AIDS.  You use needles to inject street drugs.  You live with someone who has hepatitis B.  You have had sex with someone who has hepatitis B.  You get hemodialysis treatment.  You take certain medicines for conditions, including cancer, organ transplantation, and autoimmune conditions. Hepatitis C  Blood testing is recommended for:  Everyone born from [birth year] through 1963.  Anyone with known risk factors for hepatitis C. Sexually transmitted infections (STIs)  You should be screened for sexually transmitted infections (STIs) including gonorrhea and chlamydia if:  You are sexually active and are younger than 80 years of age.  You are older than 80 years of age and your health care provider tells you that you are at risk for this type of infection.  Your sexual activity has changed since you were last screened and you are at an increased risk for chlamydia or gonorrhea. Ask your health care provider if you are at risk.  If you do not have HIV, but are at risk, it may be recommended that you take a prescription medicine daily to prevent HIV  infection. This is called pre-exposure prophylaxis (PrEP). You are considered at risk if:  You are sexually active and do not regularly use condoms or know the HIV status of your partner(s).  You take drugs by injection.  You are sexually active with a partner who has HIV. Talk with your health care provider about whether you are at high risk of being infected with HIV. If you choose to begin PrEP, you should first be tested for HIV. You should then be tested every 3 months  for as long as you are taking PrEP. Pregnancy  If you are premenopausal and you may become pregnant, ask your health care provider about preconception counseling.  If you may become pregnant, take 400 to 800 micrograms (mcg) of folic acid every day.  If you want to prevent pregnancy, talk to your health care provider about birth control (contraception). Osteoporosis and menopause  Osteoporosis is a disease in which the bones lose minerals and strength with aging. This can result in serious bone fractures. Your risk for osteoporosis can be identified using a bone density scan.  If you are 32 years of age or older, or if you are at risk for osteoporosis and fractures, ask your health care provider if you should be screened.  Ask your health care provider whether you should take a calcium or vitamin D supplement to lower your risk for osteoporosis.  Menopause may have certain physical symptoms and risks.  Hormone replacement therapy may reduce some of these symptoms and risks. Talk to your health care provider about whether hormone replacement therapy is right for you. Follow these instructions at home:  Schedule regular health, dental, and eye exams.  Stay current with your immunizations.  Do not use any tobacco products including cigarettes, chewing tobacco, or electronic cigarettes.  If you are pregnant, do not drink alcohol.  If you are breastfeeding, limit how much and how often you drink alcohol.  Limit  alcohol intake to no more than 1 drink per day for nonpregnant women. One drink equals 12 ounces of beer, 5 ounces of wine, or 1 ounces of hard liquor.  Do not use street drugs.  Do not share needles.  Ask your health care provider for help if you need support or information about quitting drugs.  Tell your health care provider if you often feel depressed.  Tell your health care provider if you have ever been abused or do not feel safe at home. This information is not intended to replace advice given to you by your health care provider. Make sure you discuss any questions you have with your health care provider. Document Released: 04/21/2011 Document Revised: 03/13/2016 Document Reviewed: 07/10/2015  2017 [employer]vier

## 2016-09-17 NOTE — Telephone Encounter (Signed)
Notified patient as instructed, patient pleased. Discussed follow-up appointments, patient agrees  

## 2016-12-05 ENCOUNTER — Encounter: Payer: Self-pay | Admitting: Family Medicine

## 2016-12-05 ENCOUNTER — Ambulatory Visit (INDEPENDENT_AMBULATORY_CARE_PROVIDER_SITE_OTHER): Payer: Medicare Other | Admitting: Family Medicine

## 2016-12-05 DIAGNOSIS — E782 Mixed hyperlipidemia: Secondary | ICD-10-CM

## 2016-12-05 DIAGNOSIS — Z5181 Encounter for therapeutic drug level monitoring: Secondary | ICD-10-CM

## 2016-12-05 DIAGNOSIS — C49A0 Gastrointestinal stromal tumor, unspecified site: Secondary | ICD-10-CM | POA: Diagnosis not present

## 2016-12-05 DIAGNOSIS — I10 Essential (primary) hypertension: Secondary | ICD-10-CM | POA: Diagnosis not present

## 2016-12-05 DIAGNOSIS — E1165 Type 2 diabetes mellitus with hyperglycemia: Secondary | ICD-10-CM

## 2016-12-05 LAB — LIPID PANEL
CHOLESTEROL: 184 mg/dL (ref <200)
HDL: 51 mg/dL (ref >50)
LDL Cholesterol: 111 mg/dL — ABNORMAL HIGH (ref <100)
Total CHOL/HDL Ratio: 3.6 Ratio (ref <5.0)
Triglycerides: 108 mg/dL (ref <150)
VLDL: 22 mg/dL (ref <30)

## 2016-12-05 LAB — COMPREHENSIVE METABOLIC PANEL
ALBUMIN: 4.1 g/dL (ref 3.6–5.1)
ALK PHOS: 67 U/L (ref 33–130)
ALT: 16 U/L (ref 6–29)
AST: 21 U/L (ref 10–35)
BUN: 13 mg/dL (ref 7–25)
CALCIUM: 10 mg/dL (ref 8.6–10.4)
CHLORIDE: 103 mmol/L (ref 98–110)
CO2: 26 mmol/L (ref 20–31)
Creat: 0.76 mg/dL (ref 0.60–0.88)
Glucose, Bld: 124 mg/dL — ABNORMAL HIGH (ref 65–99)
POTASSIUM: 4.5 mmol/L (ref 3.5–5.3)
Sodium: 142 mmol/L (ref 135–146)
TOTAL PROTEIN: 6.8 g/dL (ref 6.1–8.1)
Total Bilirubin: 0.4 mg/dL (ref 0.2–1.2)

## 2016-12-05 NOTE — Assessment & Plan Note (Addendum)
Seeing oncologist and surgeon

## 2016-12-05 NOTE — Progress Notes (Signed)
BP 116/82   Pulse 83   Temp 98.4 F (36.9 C) (Oral)   Resp 14   Wt 134 lb 9.6 oz (61.1 kg)   SpO2 94%   BMI 23.10 kg/m    Subjective:    Patient ID: Rachael Castro, female    DOB: 1931-07-16, 83 y.o.   MRN: [id number]  HPI: Rachael Castro is a 83 y.o. female  Chief Complaint  Patient presents with  . Follow-up   HTN; limiting salt; controlled  Type 2 diabetes; check sugars once a day for the most part; rarely over 128 with dietary indiscretion; none over 200; no hypoglycemic episodes; no problems with feet; just had nails trimmed; last eye exam was last June (2017), UTD Lab Results  Component Value Date   HGBA1C 6.4 (H) 09/01/2016  last urine microalbumin:creatinine was normal July 2017  Having some allergies, itchy eyes  High cholesterol; tries to limit saturated fats, "doing my best"; no problems with cholesterol medicine; no chest pain  GIST; seeing oncologist; has to watch cabbage and certain foods; surgeon has orders for CA27.29 in April  Depression screen [facility] 2/9 12/05/2016 09/16/2016 09/01/2016 04/29/2016 04/08/2016  Decreased Interest 0 0 0 0 0  Down, Depressed, Hopeless 0 0 0 0 0  PHQ - 2 Score 0 0 0 0 0   Relevant past medical, surgical, family and social history reviewed Past Medical History:  Diagnosis Date  . b 1999   breast cancer  . Diabetes mellitus   . GERD (gastroesophageal reflux disease)   . Hypercholesteremia   . Hypertension   . Kidney stones   . 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 Neuromuscular disorder (HCC)    hx shingles  . Personal history of colonic polyps   . Thyroid disease    Past Surgical History:  Procedure Laterality Date  . ABDOMINAL HYSTERECTOMY    . APPENDECTOMY    . BREAST SURGERY Right 1999   mastectomy  . CATARACT EXTRACTION W/PHACO Left 04/30/2015   Procedure: CATARACT EXTRACTION PHACO AND INTRAOCULAR LENS PLACEMENT (IOC);  Surgeon: Estill Cotta, MD;  Location: [facility] ORS;   Service: Ophthalmology;  Laterality: Left;  [place] 01:39 AP% 23.3 CDE 40.42 fluid pack lot # [id number]  . COLONOSCOPY  2008   Sankar  . EUS  02/12/2012   Procedure: UPPER ENDOSCOPIC ULTRASOUND (EUS) LINEAR;  Surgeon: Milus Banister, MD;  Location: [facility];  Service: Endoscopy;  Laterality: N/A;  radial linear  . EYE SURGERY Right 2013   cataract  . HERNIA REPAIR  2012  . LAPAROTOMY  2013   excision of gastric wall mass   . MASTECTOMY    . SALPINGOOPHORECTOMY Right 1979  . THYROIDECTOMY  1978  . TONSILLECTOMY    . TUBAL LIGATION    . UPPER GI ENDOSCOPY  2013   Family History  Problem Relation Age of Onset  . Cancer Mother     cervical  . Cancer Father     prostate  . Kidney Stones Brother   . Glaucoma Brother   . Prostatitis Brother   . Diabetes Sister   . Thyroid disease Brother   . Cancer Brother     thyroid  . Arthritis Brother   . Diabetes Sister   . Kidney disease Neg Hx   . Bladder Cancer Neg Hx    Social History  Substance Use Topics  . Smoking status: Never Smoker  . Smokeless tobacco: Never Used  .  Alcohol use No   Interim medical history since last visit reviewed. Allergies and medications reviewed  Review of Systems  Cardiovascular: Negative for chest pain.  Gastrointestinal: Negative for blood in stool.  Genitourinary:       No urinary incontinence   Per HPI unless specifically indicated above     Objective:    BP 116/82   Pulse 83   Temp 98.4 F (36.9 C) (Oral)   Resp 14   Wt 134 lb 9.6 oz (61.1 kg)   SpO2 94%   BMI 23.10 kg/m   Wt Readings from Last 3 Encounters:  12/05/16 134 lb 9.6 oz (61.1 kg)  09/16/16 132 lb (59.9 kg)  09/08/16 131 lb (59.4 kg)    Physical Exam  Constitutional: She appears well-developed and well-nourished. No distress.  She appears a decade younger than her stated age  HENT:  Head: Normocephalic and atraumatic.  Eyes: EOM are normal. No scleral icterus.  Neck: No thyromegaly present.  Cardiovascular:  Normal rate, regular rhythm and normal heart sounds.   No murmur heard. Pulmonary/Chest: Effort normal and breath sounds normal. No respiratory distress. She has no wheezes.  Abdominal: Soft. Bowel sounds are normal. She exhibits no distension.  Musculoskeletal: Normal range of motion. She exhibits no edema.  Neurological: She is alert. She exhibits normal muscle tone.  Skin: Skin is warm and dry. She is not diaphoretic. No pallor.  Psychiatric: She has a normal mood and affect. Her behavior is normal. Judgment and thought content normal.   Diabetic Foot Form - Detailed   Diabetic Foot Exam - detailed Diabetic Foot exam was performed with the following findings:  Yes 12/05/2016  9:31 AM  Visual Foot Exam completed.:  Yes  Are the toenails long?:  No Are the toenails ingrown?:  No Normal Range of Motion:  Yes Pulse Foot Exam completed.:  Yes  Right Dorsalis Pedis:  Present Left Dorsalis Pedis:  Present  Sensory Foot Exam Completed.:  Yes Swelling:  No Semmes-Weinstein Monofilament Test R Site 1-Great Toe:  Pos L Site 1-Great Toe:  Pos  R Site 4:  Pos L Site 4:  Pos  R Site 5:  Pos L Site 5:  Pos        Results for orders placed or performed in visit on 09/01/16  Lipid panel  Result Value Ref Range   Cholesterol 193 <200 mg/dL   Triglycerides 109 <150 mg/dL   HDL 54 >50 mg/dL   Total CHOL/HDL Ratio 3.6 <5.0 Ratio   VLDL 22 <30 mg/dL   LDL Cholesterol 117 (H) <100 mg/dL  Hemoglobin A1c  Result Value Ref Range   Hgb A1c MFr Bld 6.4 (H) <5.7 %   Mean Plasma Glucose 137 mg/dL  Magnesium  Result Value Ref Range   Magnesium 1.5 1.5 - 2.5 mg/dL      Assessment & Plan:   Problem List Items Addressed This Visit      Cardiovascular and Mediastinum   Hypertension    Excellent control today; limit salt        Digestive   Gastrointestinal stromal tumor (GIST) [facility])    Seeing oncologist and surgeon        Endocrine   Type 2 diabetes mellitus ([place])    Foot exam by MD;  continue limit sweets      Relevant Orders   Lipid panel   Hemoglobin A1c     Other   Medication monitoring encounter    Check labs  Relevant Orders   Comprehensive metabolic panel   Hyperlipidemia    Check lipids, avoid saturated fats      Relevant Orders   Lipid panel      Follow up plan: Return in about 4 months (around 04/04/2017) for fasting labs.  An after-visit summary was printed and given to the patient at [place].  Please see the patient instructions which may contain other information and recommendations beyond what is mentioned above in the assessment and plan.  No orders of the defined types were placed in this encounter.   Orders Placed This Encounter  Procedures  . Lipid panel  . Hemoglobin A1c  . Comprehensive metabolic panel

## 2016-12-05 NOTE — Patient Instructions (Addendum)
Try some moisturizing drops in the eyes, and then see your eye doctor if not getting better Try to limit saturated fats in your diet (bologna, hot dogs, barbeque, cheeseburgers, hamburgers, steak, bacon, sausage, cheese, etc.) and get more fresh fruits, vegetables, and whole grains Please do see your eye doctor regularly, and have your eyes examined every year (or more often per his or her recommendation) Check your feet every night and let me know right away of any sores, infections, numbness, etc. Try to limit sweets, white bread, white rice, white potatoes It is okay with me for you to not check your fingerstick blood sugars (per [employer] of Endocrinology Best Practices), unless you are interested and feel it would be helpful for you

## 2016-12-05 NOTE — Assessment & Plan Note (Signed)
Excellent control today; limit salt

## 2016-12-05 NOTE — Assessment & Plan Note (Signed)
Check labs 

## 2016-12-05 NOTE — Assessment & Plan Note (Signed)
Foot exam by MD; continue limit sweets

## 2016-12-05 NOTE — Assessment & Plan Note (Signed)
Check lipids, avoid saturated fats 

## 2016-12-06 LAB — HEMOGLOBIN A1C
Hgb A1c MFr Bld: 6.3 % — ABNORMAL HIGH (ref <5.7)
MEAN PLASMA GLUCOSE: 134 mg/dL

## 2016-12-20 ENCOUNTER — Other Ambulatory Visit: Payer: Self-pay | Admitting: Family Medicine

## 2016-12-22 NOTE — Telephone Encounter (Signed)
Feb 2018 labs reviewed; Rx approved

## 2017-01-13 ENCOUNTER — Telehealth: Payer: Self-pay

## 2017-01-13 NOTE — Telephone Encounter (Signed)
Patient called states she never got a call about her blood work back in February.  I opened her chart and saw that Dr. Sanda Klein sent her a my chart e-mail.  She states that it is her daughters e-mail, and she expected [place] to call her as well and that I needed to admit I was wrong and apologize for my mistake.  I told her Dr. Sanda Klein sent  It and did not send [place] a message on her labs and that's how she usually does it when patients have a my chart account.  If patients have an e-mail then she just sends a my chart message and if she would like a phone call then to let her know at her appointment and we can do that as well.  She states she always got a phone call and we should know that and we are liars.  "liar liar pants on fire" I told her we could erase the  e-mail so she will not send through my chart and we would call her.  She yelled no!  I told her I could have Dr. Sanda Klein to call her back and explain and she states if she could not talk with her now that she didn't want her to call back.  I informed her Dr.Lada was still with patients and she hung up.

## 2017-02-04 ENCOUNTER — Inpatient Hospital Stay: Payer: Medicare Other | Attending: Hematology and Oncology

## 2017-02-04 ENCOUNTER — Inpatient Hospital Stay (HOSPITAL_BASED_OUTPATIENT_CLINIC_OR_DEPARTMENT_OTHER): Payer: Medicare Other | Admitting: Hematology and Oncology

## 2017-02-04 ENCOUNTER — Telehealth: Payer: Self-pay | Admitting: *Deleted

## 2017-02-04 VITALS — BP 123/73 | HR 80 | Temp 98.0°F | Resp 18 | Wt 133.2 lb

## 2017-02-04 DIAGNOSIS — Z79899 Other long term (current) drug therapy: Secondary | ICD-10-CM | POA: Diagnosis not present

## 2017-02-04 DIAGNOSIS — Z7984 Long term (current) use of oral hypoglycemic drugs: Secondary | ICD-10-CM | POA: Insufficient documentation

## 2017-02-04 DIAGNOSIS — R599 Enlarged lymph nodes, unspecified: Secondary | ICD-10-CM

## 2017-02-04 DIAGNOSIS — Z853 Personal history of malignant neoplasm of breast: Secondary | ICD-10-CM | POA: Diagnosis present

## 2017-02-04 DIAGNOSIS — I10 Essential (primary) hypertension: Secondary | ICD-10-CM | POA: Insufficient documentation

## 2017-02-04 DIAGNOSIS — Z170 Estrogen receptor positive status [ER+]: Secondary | ICD-10-CM | POA: Insufficient documentation

## 2017-02-04 DIAGNOSIS — Z9223 Personal history of estrogen therapy: Secondary | ICD-10-CM

## 2017-02-04 DIAGNOSIS — E7800 Pure hypercholesterolemia, unspecified: Secondary | ICD-10-CM | POA: Insufficient documentation

## 2017-02-04 DIAGNOSIS — Z7982 Long term (current) use of aspirin: Secondary | ICD-10-CM | POA: Insufficient documentation

## 2017-02-04 DIAGNOSIS — Z9221 Personal history of antineoplastic chemotherapy: Secondary | ICD-10-CM | POA: Diagnosis not present

## 2017-02-04 DIAGNOSIS — E119 Type 2 diabetes mellitus without complications: Secondary | ICD-10-CM | POA: Insufficient documentation

## 2017-02-04 DIAGNOSIS — K219 Gastro-esophageal reflux disease without esophagitis: Secondary | ICD-10-CM | POA: Diagnosis not present

## 2017-02-04 DIAGNOSIS — Z86018 Personal history of other benign neoplasm: Secondary | ICD-10-CM | POA: Diagnosis not present

## 2017-02-04 DIAGNOSIS — C49A0 Gastrointestinal stromal tumor, unspecified site: Secondary | ICD-10-CM

## 2017-02-04 LAB — COMPREHENSIVE METABOLIC PANEL
ALT: 16 U/L (ref 14–54)
AST: 22 U/L (ref 15–41)
Albumin: 4.2 g/dL (ref 3.5–5.0)
Alkaline Phosphatase: 60 U/L (ref 38–126)
Anion gap: 8 (ref 5–15)
BUN: 15 mg/dL (ref 6–20)
CO2: 27 mmol/L (ref 22–32)
Calcium: 8.9 mg/dL (ref 8.9–10.3)
Chloride: 101 mmol/L (ref 101–111)
Creatinine, Ser: 0.68 mg/dL (ref 0.44–1.00)
GFR calc Af Amer: >60 mL/min (ref >60)
GFR calc non Af Amer: >60 mL/min (ref >60)
Glucose, Bld: 131 mg/dL — ABNORMAL HIGH (ref 65–99)
Potassium: 3.3 mmol/L — ABNORMAL LOW (ref 3.5–5.1)
Sodium: 136 mmol/L (ref 135–145)
Total Bilirubin: 0.9 mg/dL (ref 0.3–1.2)
Total Protein: 7.2 g/dL (ref 6.5–8.1)

## 2017-02-04 LAB — CBC
HCT: 35.9 % (ref 35.0–47.0)
Hemoglobin: 11.8 g/dL — ABNORMAL LOW (ref 12.0–16.0)
MCH: 31.4 pg (ref 26.0–34.0)
MCHC: 33 g/dL (ref 32.0–36.0)
MCV: 95.4 fL (ref 80.0–100.0)
Platelets: 223 10*3/uL (ref 150–440)
RBC: 3.76 MIL/uL — ABNORMAL LOW (ref 3.80–5.20)
RDW: 13.5 % (ref 11.5–14.5)
WBC: 7.1 10*3/uL (ref 3.6–11.0)

## 2017-02-04 LAB — LACTATE DEHYDROGENASE: LDH: 123 U/L (ref 98–192)

## 2017-02-04 NOTE — Progress Notes (Signed)
[place] [facility] day:  02/04/17  Chief Complaint: Rachael Castro is a 83 y.o. female with a history of right breast cancer (1999) and gastrointestinal stromal tumor (2013) who is seen for 6 month assessment.  HPI: The patient was last seen in the medical oncology clinic on 08/06/2016.  At that time, she denied any breast concerns.  She noted dizziness at times. Exam revealed fibrocystic changes in the left breast and no apparent axillary adenopathy.    Left sided mammogram and ultrasound on 09/02/2016 revealed a borderline lymph node in the left axilla, partially behind the lateral edge of the pectoralis muscle.  She was seen by Dr. Jamal Collin on 09/08/2016.  Left axillary ultrasound revealed a single large node with mild cortical thickening.  Left axillary core needle biopsy revealed benign lymphoid hyperplasia.  She has a follow-up appointment in 06/2017.  During the interim, she has done well. She denies any complaints.  She specifically denies any abdominal symptoms or breast concerns.  She denies any pain.   Past Medical History:  Diagnosis Date  . b 1997   breast cancer  . Diabetes mellitus   . GERD (gastroesophageal reflux disease)   . Hypercholesteremia   . Hypertension   . Kidney stones   . 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 Neuromuscular disorder (HCC)    hx shingles  . Personal history of colonic polyps   . Thyroid disease     Past Surgical History:  Procedure Laterality Date  . ABDOMINAL HYSTERECTOMY    . APPENDECTOMY    . BREAST SURGERY Right 1999   mastectomy  . CATARACT EXTRACTION W/PHACO Left 04/30/2015   Procedure: CATARACT EXTRACTION PHACO AND INTRAOCULAR LENS PLACEMENT (IOC);  Surgeon: Estill Cotta, MD;  Location: [facility] ORS;  Service: Ophthalmology;  Laterality: Left;  [place] 01:39 AP% 23.3 CDE 40.42 fluid pack lot # [id number]  . COLONOSCOPY  2008   Sankar  . EUS  02/12/2012   Procedure: UPPER ENDOSCOPIC ULTRASOUND (EUS) LINEAR;  Surgeon: Milus Banister, MD;  Location: [facility];  Service: Endoscopy;  Laterality: N/A;  radial linear  . EYE SURGERY Right 2013   cataract  . HERNIA REPAIR  2012  . LAPAROTOMY  2013   excision of gastric wall mass   . MASTECTOMY    . SALPINGOOPHORECTOMY Right 1979  . THYROIDECTOMY  1978  . TONSILLECTOMY    . TUBAL LIGATION    . UPPER GI ENDOSCOPY  2013    Family History  Problem Relation Age of Onset  . Cancer Mother     cervical  . Cancer Father     prostate  . Kidney Stones Brother   . Glaucoma Brother   . Prostatitis Brother   . Diabetes Sister   . Thyroid disease Brother   . Cancer Brother     thyroid  . Arthritis Brother   . Diabetes Sister   . Kidney disease Neg Hx   . Bladder Cancer Neg Hx     Social History:  reports that she has never smoked. She has never used smokeless tobacco. She reports that she does not drink alcohol or use drugs.  She lives in [place].  The patient is alone today.  Allergies: No Known Allergies  Current Medications: Current Outpatient Prescriptions  Medication Sig Dispense Refill  . aspirin 81 MG tablet Take 81 mg by mouth daily.    Marland Kitchen atorvastatin (LIPITOR) 40 MG tablet  TAKE 1 TABLET (40 MG TOTAL) BY MOUTH AT BEDTIME. 30 tablet 6  . calcium citrate-vitamin D 200-200 MG-UNIT TABS Take 1 tablet by mouth daily.    . Gabapentin, Once-Daily, 300 MG TABS Limit one tab by mouth per day or twice a day if tolerated 180 tablet 0  . losartan (COZAAR) 25 MG tablet Take 1 tablet (25 mg total) by mouth daily. 30 tablet 6  . Multiple Vitamin (MULTIVITAMIN) tablet Take 1 tablet by mouth daily.    Marland Kitchen omeprazole (PRILOSEC) 40 MG capsule Take 1 capsule (40 mg total) by mouth daily. 30 capsule 5  . sitaGLIPtin-metformin (JANUMET) 50-500 MG tablet Take 1 tablet by mouth 2 (two) times daily with a meal. 60 tablet 5  . thiamine (VITAMIN B-1) 100 MG tablet Take 100 mg by mouth daily.    Marland Kitchen  lidocaine (LIDODERM) 5 % Apply 1 - 2  patches to the right lower extremity in the area of pain for 12 hours then remove for 12 hours for treatment of postherpetic neuralgia and repeat process if tolerated (Patient not taking: Reported on 12/05/2016) 60 patch 2   No current facility-administered medications for this visit.     Review of Systems:  GENERAL:  Feels "ok".  No fevers or sweats.  Weight up 1 pound. PERFORMANCE STATUS (ECOG):  1 HEENT:  No visual changes, runny nose, sore throat, mouth sores or tenderness. Lungs: No shortness of breath or cough.  No hemoptysis. Cardiac:  No chest pain, palpitations, orthopnea, or PND.  Bradycardia noted at last visit.  Recent echo and treadmill. GI:  No nausea, vomiting, diarrhea, constipation, melena or hematochezia. GU:  No urgency, frequency, dysuria, or hematuria.  Urology follows. Musculoskeletal:  No back pain.  No joint pain.  No muscle tenderness. Extremities:  No pain or swelling. Skin:  No rashes or skin changes. Neuro:  No dizziness.  No headache, numbness or weakness, balance or coordination issues. Endocrine:  No diabetes, thyroid issues, hot flashes or night sweats. Psych:  No mood changes, depression or anxiety. Pain:  No focal pain. Review of systems:  All other systems reviewed and found to be negative.  Physical Exam: Blood pressure 123/73, pulse 80, temperature 98 F (36.7 C), temperature source Tympanic, resp. rate 18, weight 133 lb 2.5 oz (60.4 kg). GENERAL:  Well developed, well nourished, woman sitting comfortably in the exam room in no acute distress. MENTAL STATUS:  Alert and oriented to person, place and time. HEAD:  Wearing a white crochet hat.  Short curly gray hair.  Normocephalic, atraumatic, face symmetric, no Cushingoid features. EYES:  Glasses.  Brown eyes s/p left cataract removal.  Pupils equal round and reactive to light and accomodation.  No conjunctivitis or scleral icterus. ENT:  Oropharynx clear without  lesion.  Dentures.  Tongue normal. Mucous membranes moist.  RESPIRATORY:  Clear to auscultation without rales, wheezes or rhonchi. CARDIOVASCULAR: Regular rate and rhythm without murmur, rub or gallop. BREAST:  Right mastectomy.  No erythema or nodularity.  Left breast with inverted nipple.  Fibrocystic changes.  No masses, skin changes or nipple discharge. ABDOMEN:  Soft, non-tender, with active bowel sounds, and no hepatosplenomegaly.  No masses. SKIN: No rashes, ulcers or skin changes. EXTREMITIES:  No edema, skin discoloration or tenderness.  No palpable cords. LYMPH NODES: No palpable cervical, supraclavicular, axillary or inguinal adenopathy  NEUROLOGICAL: Unremarkable. PSYCH:  Appropriate.   Appointment on 02/04/2017  Component Date Value Ref Range Status  . WBC 02/04/2017 7.1  3.6 -  11.0 K/uL Final  . RBC 02/04/2017 3.76* 3.80 - 5.20 MIL/uL Final  . Hemoglobin 02/04/2017 11.8* 12.0 - 16.0 g/dL Final  . HCT 02/04/2017 35.9  35.0 - 47.0 % Final  . MCV 02/04/2017 95.4  80.0 - 100.0 fL Final  . MCH 02/04/2017 31.4  26.0 - 34.0 pg Final  . MCHC 02/04/2017 33.0  32.0 - 36.0 g/dL Final  . RDW 02/04/2017 13.5  11.5 - 14.5 % Final  . Platelets 02/04/2017 223  150 - 440 K/uL Final  . Sodium 02/04/2017 136  135 - 145 mmol/L Final  . Potassium 02/04/2017 3.3* 3.5 - 5.1 mmol/L Final  . Chloride 02/04/2017 101  101 - 111 mmol/L Final  . CO2 02/04/2017 27  22 - 32 mmol/L Final  . Glucose, Bld 02/04/2017 131* 65 - 99 mg/dL Final  . BUN 02/04/2017 15  6 - 20 mg/dL Final  . Creatinine, Ser 02/04/2017 0.68  0.44 - 1.00 mg/dL Final  . Calcium 02/04/2017 8.9  8.9 - 10.3 mg/dL Final  . Total Protein 02/04/2017 7.2  6.5 - 8.1 g/dL Final  . Albumin 02/04/2017 4.2  3.5 - 5.0 g/dL Final  . AST 02/04/2017 22  15 - 41 U/L Final  . ALT 02/04/2017 16  14 - 54 U/L Final  . Alkaline Phosphatase 02/04/2017 60  38 - 126 U/L Final  . Total Bilirubin 02/04/2017 0.9  0.3 - 1.2 mg/dL Final  . GFR calc non Af  Amer 02/04/2017 >60  >60 mL/min Final  . GFR calc Af Amer 02/04/2017 >60  >60 mL/min Final   Comment: (NOTE) The eGFR has been calculated using the CKD EPI equation. This calculation has not been validated in all clinical situations. eGFR's persistently <60 mL/min signify possible Chronic Kidney Disease.   . Anion gap 02/04/2017 8  5 - 15 Final  . LDH 02/04/2017 123  98 - 192 U/L Final    Assessment:  Rachael Castro is a 83 y.o. female with a history of right breast cancer status post modified radical mastectomy in 1999 followed by 4 cycles of Adriamycin and Cytoxan. She received tamoxifen then extended adjuvant therapy with Femara.    Left sided mammogram on 07/19/2015 revealed no evidence of malignancy.  Left sided mammogram and ultrasound on 09/02/2016 revealed a borderline lymph node in the left axilla, partially behind the lateral edge of the pectoralis muscle.  Left axillary core needle biopsy on 09/08/2016 revealed benign lymphoid hyperplasia.   CA27.29 was 46.6 on 05/30/2015, 41.9 on 07/09/2015, 42.9 on 11/05/2015, 46.2 on 12/05/2015, 53.5 on 06/04/2016, 50.0 on 08/06/2016, and 43.7 on 02/04/2017.  She has a history of gastrointestinal stromal tumor (GIST) status post resection on 02/27/2012. Pathology revealed a 6.4 cm tumor. She received a year of [place] beginning 03/2012. Her dose was reduced secondary to side effects.  Abdominal and pelvic CT scan on 04/17/2015 revealed postsurgical changes related to the prior gastric resection. There was no evidence of recurrent or metastatic disease. There were multiple nonobstructing bilateral renal calculi measuring up to 2 mm.  Abdominal and pelvic CT scan on 12/03/2015 revealed a stable exam with no evidence of recurrent disease.  There were tiny nonobstructive intrarenal calculi without evidence of hydronephrosis.  Chest, abdomen, and pelvic CT scan with contrast on 06/17/2016 revealed a new solitary mildly enlarged 1.0 cm left axillary  lymph node, nonspecific, nodal metastasis not excluded.  There were no additional potential findings of metastatic disease in the chest, abdomen or pelvis.  There  was no evidence of local tumor recurrence in the stomach.  Symptomatically, she denies any breast or abdominal symptoms.  Exam is stable.  Plan: 1.  Labs today:  CBC with diff, CMP, CA27.29. 2.  Review interval mammogram, ultrasound and lymph node biopsy.  Pathology was benign. 3.  Follow-up as scheduled with Dr. Jamal Collin in 06/2017. 4.  RTC in 6 months for MD assessment, labs (CBC with diff, CMP, CA27.29).   Lequita Asal, MD  02/04/2017, 11:25 AM

## 2017-02-04 NOTE — Telephone Encounter (Signed)
-----   Message from Lequita Asal, MD sent at 02/04/2017  3:59 PM EDT ----- Regarding: Please call patient  Potassium is a little low. Diarrhea?  Diuretic?  Encourage potassium rich food.  M  ----- Message ----- From: Interface, Lab In [place] Sent: 02/04/2017  10:47 AM To: Lequita Asal, MD

## 2017-02-04 NOTE — Progress Notes (Signed)
Patient offers no complaints today. 

## 2017-02-04 NOTE — Telephone Encounter (Signed)
Called patient to inform her that her K+ is low.  She denies taking any diuretics or diarrhea.  Encouraged her to eat K+ rich foods like baked potatoes, baked sweet potatoes, bananas, orange juice. Patient verbalized understanding.

## 2017-02-05 ENCOUNTER — Telehealth: Payer: Self-pay | Admitting: *Deleted

## 2017-02-05 LAB — CA 27.29 (SERIAL MONITOR): CA 27.29: 43.7 U/mL — ABNORMAL HIGH (ref 0.0–38.6)

## 2017-02-05 NOTE — Telephone Encounter (Signed)
Called patient and LVM that tumor marker has improved.

## 2017-02-05 NOTE — Telephone Encounter (Signed)
-----   Message from Lequita Asal, MD sent at 02/05/2017 11:50 AM EDT ----- Regarding: Call patient with CA27.29 results   Better!  M  ----- Message ----- From: Interface, Lab In [place] Sent: 02/04/2017  10:47 AM To: Lequita Asal, MD

## 2017-02-16 ENCOUNTER — Other Ambulatory Visit: Payer: Self-pay | Admitting: Family Medicine

## 2017-02-27 ENCOUNTER — Encounter: Payer: Self-pay | Admitting: Hematology and Oncology

## 2017-04-06 ENCOUNTER — Ambulatory Visit: Payer: Medicare Other | Admitting: Family Medicine

## 2017-04-07 ENCOUNTER — Encounter: Payer: Self-pay | Admitting: Family Medicine

## 2017-04-07 ENCOUNTER — Ambulatory Visit (INDEPENDENT_AMBULATORY_CARE_PROVIDER_SITE_OTHER): Payer: Medicare Other | Admitting: Family Medicine

## 2017-04-07 VITALS — BP 124/62 | HR 77 | Temp 97.8°F | Resp 16 | Ht 62.25 in | Wt 128.7 lb

## 2017-04-07 DIAGNOSIS — E782 Mixed hyperlipidemia: Secondary | ICD-10-CM | POA: Diagnosis not present

## 2017-04-07 DIAGNOSIS — Z5181 Encounter for therapeutic drug level monitoring: Secondary | ICD-10-CM

## 2017-04-07 DIAGNOSIS — M5030 Other cervical disc degeneration, unspecified cervical region: Secondary | ICD-10-CM | POA: Diagnosis not present

## 2017-04-07 DIAGNOSIS — I10 Essential (primary) hypertension: Secondary | ICD-10-CM

## 2017-04-07 DIAGNOSIS — E1165 Type 2 diabetes mellitus with hyperglycemia: Secondary | ICD-10-CM

## 2017-04-07 DIAGNOSIS — R29890 Loss of height: Secondary | ICD-10-CM | POA: Diagnosis not present

## 2017-04-07 DIAGNOSIS — Z13820 Encounter for screening for osteoporosis: Secondary | ICD-10-CM | POA: Diagnosis not present

## 2017-04-07 DIAGNOSIS — Z8509 Personal history of malignant neoplasm of other digestive organs: Secondary | ICD-10-CM

## 2017-04-07 DIAGNOSIS — M5136 Other intervertebral disc degeneration, lumbar region: Secondary | ICD-10-CM

## 2017-04-07 MED ORDER — OMEPRAZOLE 20 MG PO CPDR: 20.0000 mg | DELAYED_RELEASE_CAPSULE | Freq: Every day | ORAL | 1 refills | Status: DC

## 2017-04-07 NOTE — Assessment & Plan Note (Signed)
Patient wants to get back in to see Dr. Primus Bravo; refer back

## 2017-04-07 NOTE — Assessment & Plan Note (Signed)
Check liver and kidneys in August

## 2017-04-07 NOTE — Assessment & Plan Note (Signed)
Foot exam by MD; last A1c looked excellent; eye exam coming soon

## 2017-04-07 NOTE — Assessment & Plan Note (Signed)
Re-order DEXA, fall precautions, three servings of calcium a day; continue vitamin D supplementation

## 2017-04-07 NOTE — Progress Notes (Signed)
BP 124/62 (BP Location: Left Arm, Patient Position: Sitting, Cuff Size: Normal)   Pulse 77   Temp 97.8 F (36.6 C) (Oral)   Resp 16   Ht 5' 2.25" (1.581 m)   Wt 128 lb 11.2 oz (58.4 kg)   SpO2 97%   BMI 23.35 kg/m    Subjective:    Patient ID: Rachael Castro, female    DOB: Apr 10, 1931, 83 y.o.   MRN: [id number]  HPI: Rachael Castro is a 83 y.o. female  Chief Complaint  Patient presents with  . Follow-up    4 months     HPI Patient is here for f/u Last visit was December 05, 2016  Type 2 diabetes; eye exam coming up next month; two sisters had diabetes; foot pain just the other night, right foot; no sores; little bit of callus  High cholesterol; try to limit bacon, just one piece this morning; some cheese, not a lot; just macaroni and cheese; not a milk drinker; taking statin  She takes omeprazole for GERD; raw foods like cabbage and slaw will upset stomach; cooked cabbage is okay; using PPI 40 mg daily; no blood in the stool, no abd pain  Used to see Dr. Primus Bravo, pain clinic doctor; had injection for pain; she would like to see him again; right foot burning, neck pain  She never got the DEXA scan  Depression screen [facility] 2/9 04/07/2017 12/05/2016 09/16/2016 09/01/2016 04/29/2016  Decreased Interest 0 0 0 0 0  Down, Depressed, Hopeless 0 0 0 0 0  PHQ - 2 Score 0 0 0 0 0   Relevant past medical, surgical, family and social history reviewed Past Medical History:  Diagnosis Date  . b 1999   breast cancer  . Diabetes mellitus   . GERD (gastroesophageal reflux disease)   . Hypercholesteremia   . Hypertension   . Kidney stones   . 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 Neuromuscular disorder (HCC)    hx shingles  . Personal history of colonic polyps   . Thyroid disease    Past Surgical History:  Procedure Laterality Date  . ABDOMINAL HYSTERECTOMY    . APPENDECTOMY    . BREAST SURGERY Right 1999   mastectomy  . CATARACT  EXTRACTION W/PHACO Left 04/30/2015   Procedure: CATARACT EXTRACTION PHACO AND INTRAOCULAR LENS PLACEMENT (IOC);  Surgeon: Estill Cotta, MD;  Location: [facility] ORS;  Service: Ophthalmology;  Laterality: Left;  [place] 01:39 AP% 23.3 CDE 40.42 fluid pack lot # [id number]  . COLONOSCOPY  2008   Sankar  . EUS  02/12/2012   Procedure: UPPER ENDOSCOPIC ULTRASOUND (EUS) LINEAR;  Surgeon: Milus Banister, MD;  Location: [facility];  Service: Endoscopy;  Laterality: N/A;  radial linear  . EYE SURGERY Right 2013   cataract  . HERNIA REPAIR  2012  . LAPAROTOMY  2013   excision of gastric wall mass   . MASTECTOMY    . SALPINGOOPHORECTOMY Right 1979  . THYROIDECTOMY  1978  . TONSILLECTOMY    . TUBAL LIGATION    . UPPER GI ENDOSCOPY  2013   Family History  Problem Relation Age of Onset  . Cancer Mother        cervical  . Cancer Father        prostate  . Kidney Stones Brother   . Glaucoma Brother   . Prostatitis Brother   . Diabetes Sister   . Thyroid disease Brother   .  Cancer Brother        thyroid  . Arthritis Brother   . Diabetes Sister   . Kidney disease Neg Hx   . Bladder Cancer Neg Hx    Social History   Social History  . Marital status: Married    Spouse name: N/A  . Number of children: N/A  . Years of education: N/A   Occupational History  . Not on file.   Social History Main Topics  . Smoking status: Never Smoker  . Smokeless tobacco: Never Used  . Alcohol use No  . Drug use: No  . Sexual activity: Not on file   Other Topics Concern  . Not on file   Social History Narrative  . No narrative on file    Interim medical history since last visit reviewed. Allergies and medications reviewed  Review of Systems Per HPI unless specifically indicated above     Objective:    BP 124/62 (BP Location: Left Arm, Patient Position: Sitting, Cuff Size: Normal)   Pulse 77   Temp 97.8 F (36.6 C) (Oral)   Resp 16   Ht 5' 2.25" (1.581 m)   Wt 128 lb 11.2 oz (58.4 kg)    SpO2 97%   BMI 23.35 kg/m   Wt Readings from Last 3 Encounters:  04/07/17 128 lb 11.2 oz (58.4 kg)  02/04/17 133 lb 2.5 oz (60.4 kg)  12/05/16 134 lb 9.6 oz (61.1 kg)    Physical Exam  Constitutional: She appears well-developed and well-nourished. No distress.  She appears a decade younger than her stated age  HENT:  Head: Normocephalic and atraumatic.  Eyes: EOM are normal. No scleral icterus.  Neck: No thyromegaly present.  Cardiovascular: Normal rate, regular rhythm and normal heart sounds.   No murmur heard. Pulmonary/Chest: Effort normal and breath sounds normal. No respiratory distress. She has no wheezes.  Abdominal: Soft. Bowel sounds are normal. She exhibits no distension.  Musculoskeletal: Normal range of motion. She exhibits no edema.  Neurological: She is alert. She exhibits normal muscle tone.  Skin: Skin is warm and dry. She is not diaphoretic. No pallor.  Psychiatric: She has a normal mood and affect. Her behavior is normal. Judgment and thought content normal.   Diabetic Foot Form - Detailed   Diabetic Foot Exam - detailed Diabetic Foot exam was performed with the following findings:  Yes 04/07/2017  2:14 PM  Visual Foot Exam completed.:  Yes  Can the patient see the bottom of their feet?:  Yes Are the shoes appropriate in style and fit?:  No Pulse Foot Exam completed.:  Yes  Right Dorsalis Pedis:  Present Left Dorsalis Pedis:  Present  Sensory Foot Exam Completed.:  Yes Semmes-Weinstein Monofilament Test R Site 1-Great Toe:  Pos L Site 1-Great Toe:  Pos        Results for orders placed or performed in visit on 02/04/17  CBC  Result Value Ref Range   WBC 7.1 3.6 - 11.0 K/uL   RBC 3.76 (L) 3.80 - 5.20 MIL/uL   Hemoglobin 11.8 (L) 12.0 - 16.0 g/dL   HCT 35.9 35.0 - 47.0 %   MCV 95.4 80.0 - 100.0 fL   MCH 31.4 26.0 - 34.0 pg   MCHC 33.0 32.0 - 36.0 g/dL   RDW 13.5 11.5 - 14.5 %   Platelets 223 150 - 440 K/uL  Comprehensive metabolic panel  Result  Value Ref Range   Sodium 136 135 - 145 mmol/L   Potassium 3.3 (  L) 3.5 - 5.1 mmol/L   Chloride 101 101 - 111 mmol/L   CO2 27 22 - 32 mmol/L   Glucose, Bld 131 (H) 65 - 99 mg/dL   BUN 15 6 - 20 mg/dL   Creatinine, Ser 0.68 0.44 - 1.00 mg/dL   Calcium 8.9 8.9 - 10.3 mg/dL   Total Protein 7.2 6.5 - 8.1 g/dL   Albumin 4.2 3.5 - 5.0 g/dL   AST 22 15 - 41 U/L   ALT 16 14 - 54 U/L   Alkaline Phosphatase 60 38 - 126 U/L   Total Bilirubin 0.9 0.3 - 1.2 mg/dL   GFR calc non Af Amer >60 >60 mL/min   GFR calc Af Amer >60 >60 mL/min   Anion gap 8 5 - 15  Lactate dehydrogenase  Result Value Ref Range   LDH 123 98 - 192 U/L  CA 27.29 (SERIAL MONITOR)  Result Value Ref Range   CA 27.29 43.7 (H) 0.0 - 38.6 U/mL      Assessment & Plan:   Problem List Items Addressed This Visit      Cardiovascular and Mediastinum   Hypertension    Excellent control today; continue regimen; limit salt        Endocrine   Type 2 diabetes mellitus (HCC) - Primary    Foot exam by MD; last A1c looked excellent; eye exam coming soon      Relevant Orders   Hemoglobin A1c   Lipid panel   Microalbumin / creatinine urine ratio     Musculoskeletal and Integument   DDD (degenerative disc disease), lumbar    Refer back to Dr. Primus Bravo, established patient already      Relevant Orders   Ambulatory referral to Pain Clinic   DDD (degenerative disc disease), cervical    Patient wants to get back in to see Dr. Primus Bravo; refer back      Relevant Orders   Ambulatory referral to Pain Clinic     Other   Screening for osteoporosis    Re-order DEXA, fall precautions, three servings of calcium a day; continue vitamin D supplementation      Relevant Orders   DG Bone Density   Medication monitoring encounter    Check liver and kidneys in August      Relevant Orders   COMPLETE METABOLIC PANEL WITH GFR   Hyperlipidemia    Limit saturated fats      Relevant Orders   Lipid panel   H/O malignant gastrointestinal  stromal tumor (GIST)    Monitored by heme-onc       Other Visit Diagnoses    Loss of height       DEXA scan ordered       Follow up plan: Return in about 8 weeks (around 06/05/2017) for fasting labs only; RTC to see Dr. Sanda Klein in 4 months.  An after-visit summary was printed and given to the patient at [facility].  Please see the patient instructions which may contain other information and recommendations beyond what is mentioned above in the assessment and plan.  Meds ordered this encounter  Medications  . omeprazole (PRILOSEC) 20 MG capsule    Sig: Take 1 capsule (20 mg total) by mouth daily.    Dispense:  30 capsule    Refill:  1    Orders Placed This Encounter  Procedures  . DG Bone Density  . COMPLETE METABOLIC PANEL WITH GFR  . Hemoglobin A1c  . Lipid panel  . Microalbumin / creatinine  urine ratio  . Ambulatory referral to Pain Clinic

## 2017-04-07 NOTE — Assessment & Plan Note (Signed)
Limit saturated fats 

## 2017-04-07 NOTE — Assessment & Plan Note (Signed)
Refer back to Dr. Primus Bravo, established patient already

## 2017-04-07 NOTE — Assessment & Plan Note (Signed)
Monitored by heme-onc 

## 2017-04-07 NOTE — Patient Instructions (Addendum)
Decrease the omeprazole from 40 mg daily to 20 mg daily If you have any symptoms of abdominal pain or worsening reflux, then go back up to 40 mg daily right away and call me Return in August just for fasting labs Return to see Dr. Sanda Klein in 4 months Please do call to schedule your bone density study; the number to schedule one at either [facility] or [facility] is [phone] or [phone]

## 2017-04-07 NOTE — Assessment & Plan Note (Signed)
Excellent control today; continue regimen; limit salt

## 2017-06-03 ENCOUNTER — Other Ambulatory Visit: Payer: Medicare Other

## 2017-06-08 ENCOUNTER — Other Ambulatory Visit: Payer: Self-pay

## 2017-06-08 DIAGNOSIS — Z5181 Encounter for therapeutic drug level monitoring: Secondary | ICD-10-CM

## 2017-06-08 DIAGNOSIS — E782 Mixed hyperlipidemia: Secondary | ICD-10-CM

## 2017-06-08 DIAGNOSIS — E1165 Type 2 diabetes mellitus with hyperglycemia: Secondary | ICD-10-CM

## 2017-06-09 LAB — LIPID PANEL
CHOLESTEROL: 155 mg/dL (ref <200)
HDL: 48 mg/dL — ABNORMAL LOW (ref >50)
LDL CALC: 83 mg/dL (ref <100)
TRIGLYCERIDES: 120 mg/dL (ref <150)
Total CHOL/HDL Ratio: 3.2 Ratio (ref <5.0)
VLDL: 24 mg/dL (ref <30)

## 2017-06-09 LAB — MICROALBUMIN / CREATININE URINE RATIO
Creatinine, Urine: 186 mg/dL (ref 20–320)
MICROALB/CREAT RATIO: 15 ug/mg{creat} (ref <30)
Microalb, Ur: 2.7 mg/dL

## 2017-06-09 LAB — COMPLETE METABOLIC PANEL WITH GFR
ALBUMIN: 4.1 g/dL (ref 3.6–5.1)
ALK PHOS: 67 U/L (ref 33–130)
ALT: 12 U/L (ref 6–29)
AST: 17 U/L (ref 10–35)
BUN: 13 mg/dL (ref 7–25)
CALCIUM: 9.3 mg/dL (ref 8.6–10.4)
CHLORIDE: 107 mmol/L (ref 98–110)
CO2: 23 mmol/L (ref 20–32)
Creat: 0.64 mg/dL (ref 0.60–0.88)
GFR, EST NON AFRICAN AMERICAN: 83 mL/min (ref >=60)
Glucose, Bld: 100 mg/dL — ABNORMAL HIGH (ref 65–99)
POTASSIUM: 4.2 mmol/L (ref 3.5–5.3)
Sodium: 145 mmol/L (ref 135–146)
Total Bilirubin: 0.3 mg/dL (ref 0.2–1.2)
Total Protein: 6.6 g/dL (ref 6.1–8.1)

## 2017-06-09 LAB — HEMOGLOBIN A1C
HEMOGLOBIN A1C: 6.2 % — AB (ref <5.7)
MEAN PLASMA GLUCOSE: 131 mg/dL

## 2017-06-15 ENCOUNTER — Other Ambulatory Visit: Payer: Self-pay | Admitting: Family Medicine

## 2017-06-18 ENCOUNTER — Telehealth: Payer: Self-pay | Admitting: Family Medicine

## 2017-06-18 NOTE — Telephone Encounter (Signed)
Left detailed voicemial 

## 2017-06-18 NOTE — Telephone Encounter (Signed)
Please remind patient to get the bone density test to check for osteoporosis soon; thank you

## 2017-07-08 ENCOUNTER — Other Ambulatory Visit: Payer: Self-pay | Admitting: Family Medicine

## 2017-07-09 NOTE — Telephone Encounter (Signed)
She has GIST Rx approved for omeprazole back in August for 30 pills plus 6 refills; she should not be out; please resolve with the pharmacy Thank you

## 2017-08-04 NOTE — Progress Notes (Signed)
[facility] day:  08/05/17  Chief Complaint: Rachael Castro is a 83 y.o. female with a history of right breast cancer (1999) and gastrointestinal stromal tumor (2013) who is seen for 6 month assessment.  HPI: The patient was last seen in the medical oncology clinic on 02/04/2017.  At that time, she denied any abdominal or breast concerns.  She noted dizziness at times. Exam was stable.  Patient scheduled to follow up with Dr. Jamal Collin in August.  CBC revealed hemoglobin 11.8, hematocrit 35.9, platelets 223,000. Potassium was low at 3.3. LDH was normal. CA27.29 was 43.7 (previously 50.0).  During the interim, she notes fatigue with ambulation. She denies exertional shortness of breath. Patient denies abdominal symptoms. There is no bleeding; denies hematochezia and melena. Patient has cervical neuralgia and tingling in her RIGHT lower extremity. She is taking gabapentin BID, which is effective in managing her symptoms. Patient denies any B symptoms or interval infections. Patient is eating "ok". She has lost 8 pounds since last clinic visit.    Past Medical History:  Diagnosis Date  . b 1999   breast cancer  . Diabetes mellitus   . GERD (gastroesophageal reflux disease)   . Hypercholesteremia   . Hypertension   . Kidney stones   . 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 Neuromuscular disorder (HCC)    hx shingles  . Personal history of colonic polyps   . Thyroid disease     Past Surgical History:  Procedure Laterality Date  . ABDOMINAL HYSTERECTOMY    . APPENDECTOMY    . BREAST SURGERY Right 1999   mastectomy  . CATARACT EXTRACTION W/PHACO Left 04/30/2015   Procedure: CATARACT EXTRACTION PHACO AND INTRAOCULAR LENS PLACEMENT (IOC);  Surgeon: Estill Cotta, MD;  Location: [facility] ORS;  Service: Ophthalmology;  Laterality: Left;  [place] 01:39 AP% 23.3 CDE 40.42 fluid pack lot # [id number]  . COLONOSCOPY  2008    Sankar  . EUS  02/12/2012   Procedure: UPPER ENDOSCOPIC ULTRASOUND (EUS) LINEAR;  Surgeon: Milus Banister, MD;  Location: [facility];  Service: Endoscopy;  Laterality: N/A;  radial linear  . EYE SURGERY Right 2013   cataract  . HERNIA REPAIR  2012  . LAPAROTOMY  2013   excision of gastric wall mass   . MASTECTOMY    . SALPINGOOPHORECTOMY Right 1979  . THYROIDECTOMY  1978  . TONSILLECTOMY    . TUBAL LIGATION    . UPPER GI ENDOSCOPY  2013    Family History  Problem Relation Age of Onset  . Cancer Mother        cervical  . Cancer Father        prostate  . Kidney Stones Brother   . Glaucoma Brother   . Prostatitis Brother   . Diabetes Sister   . Thyroid disease Brother   . Cancer Brother        thyroid  . Arthritis Brother   . Diabetes Sister   . Kidney disease Neg Hx   . Bladder Cancer Neg Hx     Social History:  reports that she has never smoked. She has never used smokeless tobacco. She reports that she does not drink alcohol or use drugs.  She lives in [place].  The patient is alone today.  Allergies: No Known Allergies  Current Medications: Current Outpatient Prescriptions  Medication Sig Dispense Refill  . aspirin 81 MG tablet Take 81 mg  by mouth daily.    Marland Kitchen atorvastatin (LIPITOR) 40 MG tablet TAKE 1 TABLET (40 MG TOTAL) BY MOUTH AT BEDTIME. 30 tablet 6  . Calcium Carbonate-Vitamin D (CALTRATE 600+D PO) Take 1 tablet by mouth daily.    . calcium citrate-vitamin D 200-200 MG-UNIT TABS Take 1 tablet by mouth daily.    Marland Kitchen JANUMET 50-500 MG tablet TAKE 1 TABLET BY MOUTH 2 (TWO) TIMES DAILY WITH A MEAL. 60 tablet 5  . losartan (COZAAR) 25 MG tablet Take 1 tablet (25 mg total) by mouth daily. 30 tablet 6  . Multiple Vitamin (MULTIVITAMIN) tablet Take 1 tablet by mouth daily.    Marland Kitchen omeprazole (PRILOSEC) 20 MG capsule TAKE 1 CAPSULE BY MOUTH EVERY DAY 30 capsule 6  . thiamine (VITAMIN B-1) 100 MG tablet Take 100 mg by mouth daily.     No current facility-administered  medications for this visit.     Review of Systems:  GENERAL:  Feels "good".  "Tired with age".  No fevers or sweats.  Weight down 8 pounds.  PERFORMANCE STATUS (ECOG):  1 HEENT:  No visual changes, runny nose, sore throat, mouth sores or tenderness. Lungs: No shortness of breath or cough.  No hemoptysis. Cardiac:  No chest pain, palpitations, orthopnea, or PND.  Bradycardia noted at last visit.  Recent echo and treadmill. GI:  No nausea, vomiting, diarrhea, constipation, melena or hematochezia. GU:  No urgency, frequency, dysuria, or hematuria.  Urology follows. Musculoskeletal:  No back pain.  No joint pain.  No muscle tenderness. Extremities:  No pain or swelling. Skin:  No rashes or skin changes. Neuro:  No dizziness.  Base of neck neuralgia.  No headache, numbness or weakness, balance or coordination issues. Endocrine:  No diabetes, thyroid issues, hot flashes or night sweats. Psych:  No mood changes, depression or anxiety. Pain:  No focal pain. Review of systems:  All other systems reviewed and found to be negative.  Physical Exam: Blood pressure 137/77, pulse 67, temperature 97.8 F (36.6 C), temperature source Tympanic, resp. rate 18, height 5' 2.25" (1.581 m), weight 120 lb (54.4 kg). GENERAL:  Well developed, well nourished, woman sitting comfortably in the exam room in no acute distress. MENTAL STATUS:  Alert and oriented to person, place and time. HEAD:  Wearing a tan cap.  Short curly gray hair.  Normocephalic, atraumatic, face symmetric, no Cushingoid features. EYES:  Glasses.  Brown eyes s/p left cataract removal.  Pupils equal round and reactive to light and accomodation.  No conjunctivitis or scleral icterus. ENT:  Oropharynx clear without lesion.  Dentures.  Tongue normal. Mucous membranes moist.  RESPIRATORY:  Clear to auscultation without rales, wheezes or rhonchi. CARDIOVASCULAR: Regular rate and rhythm without murmur, rub or gallop. BREAST:  Right mastectomy.  No  erythema or nodularity.  Left breast with inverted nipple.  Fibrocystic changes.  No masses, skin changes or nipple discharge. ABDOMEN:  Soft, non-tender, with active bowel sounds, and no hepatosplenomegaly.  No masses. SKIN: No rashes, ulcers or skin changes. EXTREMITIES:  No edema, skin discoloration or tenderness.  No palpable cords. LYMPH NODES: No palpable cervical, supraclavicular, axillary or inguinal adenopathy  NEUROLOGICAL: Unremarkable. PSYCH:  Appropriate.   Office Visit on 08/05/2017  Component Date Value Ref Range Status  . WBC 08/05/2017 8.3  3.6 - 11.0 K/uL Final  . RBC 08/05/2017 3.54* 3.80 - 5.20 MIL/uL Final  . Hemoglobin 08/05/2017 11.4* 12.0 - 16.0 g/dL Final  . HCT 08/05/2017 33.8* 35.0 - 47.0 % Final  .  MCV 08/05/2017 95.5  80.0 - 100.0 fL Final  . MCH 08/05/2017 32.1  26.0 - 34.0 pg Final  . MCHC 08/05/2017 33.6  32.0 - 36.0 g/dL Final  . RDW 21/78/2749 13.7  11.5 - 14.5 % Final  . Platelets 08/05/2017 205  150 - 440 K/uL Final  . Neutrophils Relative % 08/05/2017 69  % Final  . Neutro Abs 08/05/2017 5.7  1.4 - 6.5 K/uL Final  . Lymphocytes Relative 08/05/2017 25  % Final  . Lymphs Abs 08/05/2017 2.1  1.0 - 3.6 K/uL Final  . Monocytes Relative 08/05/2017 5  % Final  . Monocytes Absolute 08/05/2017 0.4  0.2 - 0.9 K/uL Final  . Eosinophils Relative 08/05/2017 1  % Final  . Eosinophils Absolute 08/05/2017 0.1  0 - 0.7 K/uL Final  . Basophils Relative 08/05/2017 0  % Final  . Basophils Absolute 08/05/2017 0.0  0 - 0.1 K/uL Final  . Sodium 08/05/2017 137  135 - 145 mmol/L Final  . Potassium 08/05/2017 3.9  3.5 - 5.1 mmol/L Final  . Chloride 08/05/2017 102  101 - 111 mmol/L Final  . CO2 08/05/2017 27  22 - 32 mmol/L Final  . Glucose, Bld 08/05/2017 185* 65 - 99 mg/dL Final  . BUN 76/80/8673 22* 6 - 20 mg/dL Final  . Creatinine, Ser 08/05/2017 0.78  0.44 - 1.00 mg/dL Final  . Calcium 81/85/3714 9.1  8.9 - 10.3 mg/dL Final  . Total Protein 08/05/2017 7.3  6.5 -  8.1 g/dL Final  . Albumin 68/97/2818 3.9  3.5 - 5.0 g/dL Final  . AST 79/76/6620 23  15 - 41 U/L Final  . ALT 08/05/2017 17  14 - 54 U/L Final  . Alkaline Phosphatase 08/05/2017 64  38 - 126 U/L Final  . Total Bilirubin 08/05/2017 0.5  0.3 - 1.2 mg/dL Final  . GFR calc non Af Amer 08/05/2017 >60  >60 mL/min Final  . GFR calc Af Amer 08/05/2017 >60  >60 mL/min Final   Comment: (NOTE) The eGFR has been calculated using the CKD EPI equation. This calculation has not been validated in all clinical situations. eGFR's persistently <60 mL/min signify possible Chronic Kidney Disease.   Eustaquio Boyden gap 08/05/2017 8  5 - 15 Final    Assessment:  Anabeth Chilcott is a 83 y.o. female with a history of right breast cancer status post modified radical mastectomy in 1999 followed by 4 cycles of Adriamycin and Cytoxan. She received tamoxifen then extended adjuvant therapy with Femara.    Left sided mammogram on 07/19/2015 revealed no evidence of malignancy.  Left sided mammogram and ultrasound on 09/02/2016 revealed a borderline lymph node in the left axilla, partially behind the lateral edge of the pectoralis muscle.  Left axillary core needle biopsy on 09/08/2016 revealed benign lymphoid hyperplasia.   CA27.29 was 46.6 on 05/30/2015, 41.9 on 07/09/2015, 42.9 on 11/05/2015, 46.2 on 12/05/2015, 53.5 on 06/04/2016, 50.0 on 08/06/2016, 43.7 on 02/04/2017, and 39.2 on 08/05/2017.  She has a history of gastrointestinal stromal tumor (GIST) status post resection on 02/27/2012. Pathology revealed a 6.4 cm tumor. She received a year of Gleevec beginning 03/2012. Her dose was reduced secondary to side effects.  Abdominal and pelvic CT scan on 04/17/2015 revealed postsurgical changes related to the prior gastric resection. There was no evidence of recurrent or metastatic disease. There were multiple nonobstructing bilateral renal calculi measuring up to 2 mm.  Abdominal and pelvic CT scan on 12/03/2015 revealed a  stable exam with  no evidence of recurrent disease.  There were tiny nonobstructive intrarenal calculi without evidence of hydronephrosis.  Chest, abdomen, and pelvic CT scan with contrast on 06/17/2016 revealed a new solitary mildly enlarged 1.0 cm left axillary lymph node, nonspecific, nodal metastasis not excluded.  There were no additional potential findings of metastatic disease in the chest, abdomen or pelvis.  There was no evidence of local tumor recurrence in the stomach.   Symptomatically, she denies any breast or abdominal symptoms.  Exam is stable. Labs unremarkable.   Plan: 1.  Labs today:  CBC with diff, CMP, CA27.29. 2.  Discuss annual imaging for follow-up of GIST.  Will schedule non-contrast CT of the chest, abdomen and pelvis for 08/24/2017. 3.  Discuss follow-up with Dr. Jamal Collin as already scheduled.  Ensure that he has next mammogram ordered before his retirement. Discuss transition of care from [facility] to [facility].  4.  Discuss follow up in the medical oncology clinic. After her next appointment in 6 months, we will anticipate seeing patient annually.  5.  RTC in 6 months for MD assessment, labs (CBC with diff, CMP, CA27.29), and review of annual mammogram.   Honor Loh, NP  08/05/2017, 10:53 AM   I saw and evaluated the patient, participating in the key portions of the service and reviewing pertinent diagnostic studies and records.  I reviewed the nurse practitioner's note and agree with the findings and the plan.  The assessment and plan were discussed with the patient.  Additional diagnostic studies of CT scans are needed to follow-up on GIST and would change the clinical management.  A few questions were asked by the patient and answered.   Nolon Stalls, MD 08/05/2017, 10:53 AM

## 2017-08-05 ENCOUNTER — Inpatient Hospital Stay: Payer: Medicare Other

## 2017-08-05 ENCOUNTER — Encounter: Payer: Self-pay | Admitting: Hematology and Oncology

## 2017-08-05 ENCOUNTER — Inpatient Hospital Stay: Payer: Medicare Other | Attending: Hematology and Oncology | Admitting: Hematology and Oncology

## 2017-08-05 VITALS — BP 137/77 | HR 67 | Temp 97.8°F | Resp 18 | Ht 62.25 in | Wt 120.0 lb

## 2017-08-05 DIAGNOSIS — Z9221 Personal history of antineoplastic chemotherapy: Secondary | ICD-10-CM | POA: Diagnosis not present

## 2017-08-05 DIAGNOSIS — Z7982 Long term (current) use of aspirin: Secondary | ICD-10-CM | POA: Insufficient documentation

## 2017-08-05 DIAGNOSIS — E119 Type 2 diabetes mellitus without complications: Secondary | ICD-10-CM | POA: Insufficient documentation

## 2017-08-05 DIAGNOSIS — Z86018 Personal history of other benign neoplasm: Secondary | ICD-10-CM | POA: Insufficient documentation

## 2017-08-05 DIAGNOSIS — Z79899 Other long term (current) drug therapy: Secondary | ICD-10-CM | POA: Diagnosis not present

## 2017-08-05 DIAGNOSIS — I10 Essential (primary) hypertension: Secondary | ICD-10-CM | POA: Insufficient documentation

## 2017-08-05 DIAGNOSIS — Z9011 Acquired absence of right breast and nipple: Secondary | ICD-10-CM | POA: Diagnosis not present

## 2017-08-05 DIAGNOSIS — Z9223 Personal history of estrogen therapy: Secondary | ICD-10-CM

## 2017-08-05 DIAGNOSIS — K219 Gastro-esophageal reflux disease without esophagitis: Secondary | ICD-10-CM | POA: Insufficient documentation

## 2017-08-05 DIAGNOSIS — Z170 Estrogen receptor positive status [ER+]: Secondary | ICD-10-CM | POA: Diagnosis not present

## 2017-08-05 DIAGNOSIS — E7800 Pure hypercholesterolemia, unspecified: Secondary | ICD-10-CM | POA: Insufficient documentation

## 2017-08-05 DIAGNOSIS — Z853 Personal history of malignant neoplasm of breast: Secondary | ICD-10-CM | POA: Diagnosis not present

## 2017-08-05 DIAGNOSIS — C49A0 Gastrointestinal stromal tumor, unspecified site: Secondary | ICD-10-CM

## 2017-08-05 LAB — CBC WITH DIFFERENTIAL/PLATELET
Basophils Absolute: 0 10*3/uL (ref 0–0.1)
Basophils Relative: 0 %
Eosinophils Absolute: 0.1 10*3/uL (ref 0–0.7)
Eosinophils Relative: 1 %
HCT: 33.8 % — ABNORMAL LOW (ref 35.0–47.0)
Hemoglobin: 11.4 g/dL — ABNORMAL LOW (ref 12.0–16.0)
Lymphocytes Relative: 25 %
Lymphs Abs: 2.1 10*3/uL (ref 1.0–3.6)
MCH: 32.1 pg (ref 26.0–34.0)
MCHC: 33.6 g/dL (ref 32.0–36.0)
MCV: 95.5 fL (ref 80.0–100.0)
Monocytes Absolute: 0.4 10*3/uL (ref 0.2–0.9)
Monocytes Relative: 5 %
Neutro Abs: 5.7 10*3/uL (ref 1.4–6.5)
Neutrophils Relative %: 69 %
Platelets: 205 10*3/uL (ref 150–440)
RBC: 3.54 MIL/uL — ABNORMAL LOW (ref 3.80–5.20)
RDW: 13.7 % (ref 11.5–14.5)
WBC: 8.3 10*3/uL (ref 3.6–11.0)

## 2017-08-05 LAB — COMPREHENSIVE METABOLIC PANEL
ALT: 17 U/L (ref 14–54)
AST: 23 U/L (ref 15–41)
Albumin: 3.9 g/dL (ref 3.5–5.0)
Alkaline Phosphatase: 64 U/L (ref 38–126)
Anion gap: 8 (ref 5–15)
BUN: 22 mg/dL — ABNORMAL HIGH (ref 6–20)
CO2: 27 mmol/L (ref 22–32)
Calcium: 9.1 mg/dL (ref 8.9–10.3)
Chloride: 102 mmol/L (ref 101–111)
Creatinine, Ser: 0.78 mg/dL (ref 0.44–1.00)
GFR calc Af Amer: >60 mL/min (ref >60)
GFR calc non Af Amer: >60 mL/min (ref >60)
Glucose, Bld: 185 mg/dL — ABNORMAL HIGH (ref 65–99)
Potassium: 3.9 mmol/L (ref 3.5–5.1)
Sodium: 137 mmol/L (ref 135–145)
Total Bilirubin: 0.5 mg/dL (ref 0.3–1.2)
Total Protein: 7.3 g/dL (ref 6.5–8.1)

## 2017-08-05 NOTE — Progress Notes (Signed)
Patient here to follow-up with Dr. Mike Gip for h/o GIST and h/o right breast cancer. Patient has not had a bone density test since 2014. Patient currently on calcium-citrate-vit. d 200-200 mg daily +  + calrate 600 + D 1 tab orally daily. I encouraged her to followup with Dr. Sanda Klein on ordering the bone density test. She is scheduled to see her pcp on Friday this week.  Per patient, she is due for her next mammogram in Nov 2018-Dr. Sankar usually orders her mammograms. She states that her next mammo has not yet been scheduled but she will f/u with Dr. Angie Fava office to determine the status of her mammo apts.  Patient performs her self breast exam. Pt denies any breast lumps.  Patient easily fatigues with prolonged ambulation. She denies any shortness of breath with ambulation. She denies any N/V/D. She reports 2 BM-normal pattern per day. She uses stools softners/prune juice on occasion to help with intermittent constipation. Pt reports intermittent numbness and tingling from her right leg (knee to foot). She states that she is diabetic. Patient reports new onset of neuralgia at the base of her neck.

## 2017-08-06 LAB — CANCER ANTIGEN 27.29: CA 27.29: 39.2 U/mL — ABNORMAL HIGH (ref 0.0–38.6)

## 2017-08-07 ENCOUNTER — Other Ambulatory Visit: Payer: Self-pay

## 2017-08-07 ENCOUNTER — Encounter: Payer: Self-pay | Admitting: Family Medicine

## 2017-08-07 ENCOUNTER — Ambulatory Visit (INDEPENDENT_AMBULATORY_CARE_PROVIDER_SITE_OTHER): Payer: Medicare Other | Admitting: Family Medicine

## 2017-08-07 DIAGNOSIS — Z8509 Personal history of malignant neoplasm of other digestive organs: Secondary | ICD-10-CM

## 2017-08-07 DIAGNOSIS — E782 Mixed hyperlipidemia: Secondary | ICD-10-CM | POA: Diagnosis not present

## 2017-08-07 DIAGNOSIS — Z5181 Encounter for therapeutic drug level monitoring: Secondary | ICD-10-CM

## 2017-08-07 DIAGNOSIS — I10 Essential (primary) hypertension: Secondary | ICD-10-CM

## 2017-08-07 DIAGNOSIS — E1165 Type 2 diabetes mellitus with hyperglycemia: Secondary | ICD-10-CM | POA: Diagnosis not present

## 2017-08-07 DIAGNOSIS — Z1231 Encounter for screening mammogram for malignant neoplasm of breast: Secondary | ICD-10-CM

## 2017-08-07 NOTE — Assessment & Plan Note (Signed)
Monitor kidneys every 6-12 months

## 2017-08-07 NOTE — Assessment & Plan Note (Addendum)
Check fasting lipids in February 2019; continue med; goal LDL less than 100

## 2017-08-07 NOTE — Patient Instructions (Addendum)
If you need just a little bit of salt, I'd prefer that you use the real thing instead of the salt substitutes Keep up the great job Please do see your eye doctor regularly, and have your eyes examined every year (or more often per his or her recommendation) Check your feet every night and let me know right away of any sores, infections, numbness, etc. Try to limit sweets, white bread, white rice, white potatoes It is okay with me for you to not check your fingerstick blood sugars (per [employer] of Endocrinology Best Practices), unless you are interested and feel it would be helpful for you [facility] staff -- Request the eye exam note from Dr. Thomasene Ripple and update health maintenance please Please do check with Dr. Jamal Collin and ask if Dr. Bary Castilla will be following you after he retires, and also to get your next mammogram orders

## 2017-08-07 NOTE — Progress Notes (Signed)
BP 122/68   Pulse 70   Temp (!) 97.5 F (36.4 C) (Oral)   Resp 16   Wt 124 lb 11.2 oz (56.6 kg)   SpO2 98%   BMI 22.63 kg/m    Subjective:    Patient ID: Rachael Castro, female    DOB: [birth year], 83 y.o.   MRN: [id number]  HPI: Rachael Castro is a 83 y.o. female  Chief Complaint  Patient presents with  . Follow-up    HPI Patient is here for f/u Stepped in fire ant bed in August and still has some healing spots on the right foot Type 2 diabetes; checking sugars just once in a while; she just checks if eating something out of the ordinary; last glucose was not fasting (185 two days ago); eye exam is due she thinks in November; sees Dr. Thomasene Ripple Lab Results  Component Value Date   HGBA1C 6.2 (H) 06/08/2017   High cholesterol; eating oatmeal; limiting fatty meats; taking statin; no problems with that medicine Lab Results  Component Value Date   CHOL 155 06/08/2017   HDL 48 (L) 06/08/2017   LDLCALC 83 06/08/2017   TRIG 120 06/08/2017   CHOLHDL 3.2 06/08/2017   She has GIST and sees oncologist; onc just did labs and is ordering a CT scan for her just to be thorough she says Hypertension; well-controlled today; her husband has HTN too so they watch what they add to food; not using salt substitutes any more GERD; not really heartburn; just watches certain things; raw broccoli, slaw, something like that; avoiding triggers; no blood in the stool No chest pain, no trouble breathing  Depression screen [facility] 2/9 08/07/2017 04/07/2017 12/05/2016 09/16/2016 09/01/2016  Decreased Interest 0 0 0 0 0  Down, Depressed, Hopeless 0 0 0 0 0  PHQ - 2 Score 0 0 0 0 0    Relevant past medical, surgical, family and social history reviewed Past Medical History:  Diagnosis Date  . b 1999   breast cancer  . Diabetes mellitus   . GERD (gastroesophageal reflux disease)   . Hypercholesteremia   . Hypertension   . Kidney stones   . Malignant neoplasm of other specified sites of  stomach 2013   GIST  . Neuromuscular disorder (HCC)    hx shingles  . Personal history of colonic polyps   . Thyroid disease    Past Surgical History:  Procedure Laterality Date  . ABDOMINAL HYSTERECTOMY    . APPENDECTOMY    . BREAST SURGERY Right 1999   mastectomy  . CATARACT EXTRACTION W/PHACO Left 04/30/2015   Procedure: CATARACT EXTRACTION PHACO AND INTRAOCULAR LENS PLACEMENT (IOC);  Surgeon: Estill Cotta, MD;  Location: [facility] ORS;  Service: Ophthalmology;  Laterality: Left;  [place] 01:39 AP% 23.3 CDE 40.42 fluid pack lot # [id number]  . COLONOSCOPY  2008   Sankar  . EUS  02/12/2012   Procedure: UPPER ENDOSCOPIC ULTRASOUND (EUS) LINEAR;  Surgeon: Milus Banister, MD;  Location: [facility];  Service: Endoscopy;  Laterality: N/A;  radial linear  . EYE SURGERY Right 2013   cataract  . HERNIA REPAIR  2012  . LAPAROTOMY  2013   excision of gastric wall mass   . MASTECTOMY    . SALPINGOOPHORECTOMY Right 1979  . THYROIDECTOMY  1978  . TONSILLECTOMY    . TUBAL LIGATION    . UPPER GI ENDOSCOPY  2013   Family History  Problem Relation Age of Onset  . Cancer Mother  cervical  . Cancer Father        prostate  . Kidney Stones Brother   . Glaucoma Brother   . Prostatitis Brother   . Diabetes Sister   . Thyroid disease Brother   . Cancer Brother        thyroid  . Arthritis Brother   . Diabetes Sister   . Kidney disease Neg Hx   . Bladder Cancer Neg Hx    Social History   Social History  . Marital status: Married    Spouse name: N/A  . Number of children: N/A  . Years of education: N/A   Occupational History  . Not on file.   Social History Main Topics  . Smoking status: Never Smoker  . Smokeless tobacco: Never Used  . Alcohol use No  . Drug use: No  . Sexual activity: Yes   Other Topics Concern  . Not on file   Social History Narrative  . No narrative on file    Interim medical history since last visit reviewed. Allergies and medications  reviewed  Review of Systems Per HPI unless specifically indicated above     Objective:    BP 122/68   Pulse 70   Temp (!) 97.5 F (36.4 C) (Oral)   Resp 16   Wt 124 lb 11.2 oz (56.6 kg)   SpO2 98%   BMI 22.63 kg/m   Wt Readings from Last 3 Encounters:  08/07/17 124 lb 11.2 oz (56.6 kg)  08/05/17 120 lb (54.4 kg)  04/07/17 128 lb 11.2 oz (58.4 kg)    Physical Exam  Constitutional: She appears well-developed and well-nourished. No distress.  She appears a decade younger than her stated age  HENT:  Head: Normocephalic and atraumatic.  Eyes: No scleral icterus.  Neck: No thyromegaly present.  Cardiovascular: Normal rate, regular rhythm and normal heart sounds.   No murmur heard. Pulmonary/Chest: Effort normal and breath sounds normal.  Abdominal: Soft. She exhibits no distension.  Musculoskeletal: Normal range of motion. She exhibits no edema.  Neurological: She is alert. She exhibits normal muscle tone.  Skin: Skin is warm and dry. She is not diaphoretic. No pallor.  Psychiatric: She has a normal mood and affect. Her behavior is normal. Judgment and thought content normal.   Diabetic Foot Form - Detailed   Diabetic Foot Exam - detailed Diabetic Foot exam was performed with the following findings:  Yes 08/07/2017  1:55 PM  Visual Foot Exam completed.:  Yes  Pulse Foot Exam completed.:  Yes  Right Dorsalis Pedis:  Present Left Dorsalis Pedis:  Present  Sensory Foot Exam Completed.:  Yes Semmes-Weinstein Monofilament Test R Site 1-Great Toe:  Pos L Site 1-Great Toe:  Pos          Assessment & Plan:   Problem List Items Addressed This Visit      Cardiovascular and Mediastinum   Hypertension    controlled        Endocrine   Type 2 diabetes mellitus ([place])    Last glucose of 185 two days ago was NOT fasting; check A1c and lipids; foot exam by MD        Other   Medication monitoring encounter    Monitor kidneys every 6-12 months      Hyperlipidemia     Check fasting lipids in February 2019; continue med; goal LDL less than 100      H/O malignant gastrointestinal stromal tumor (GIST)    Following up with oncology  Follow up plan: Return in about 4 months (around 12/10/2017) for twenty minute follow-up with fasting labs.  An after-visit summary was printed and given to the patient at [place].  Please see the patient instructions which may contain other information and recommendations beyond what is mentioned above in the assessment and plan.  No orders of the defined types were placed in this encounter.   No orders of the defined types were placed in this encounter.

## 2017-08-07 NOTE — Assessment & Plan Note (Signed)
Last glucose of 185 two days ago was NOT fasting; check A1c and lipids; foot exam by MD

## 2017-08-07 NOTE — Assessment & Plan Note (Signed)
controlled 

## 2017-08-07 NOTE — Assessment & Plan Note (Addendum)
Following up with oncology

## 2017-08-14 ENCOUNTER — Other Ambulatory Visit: Payer: Self-pay

## 2017-08-14 MED ORDER — SITAGLIPTIN PHOS-METFORMIN HCL 50-500 MG PO TABS: 1.0000 | ORAL_TABLET | Freq: Two times a day (BID) | ORAL | 9 refills | Status: DC

## 2017-08-14 NOTE — Telephone Encounter (Signed)
Last Cr and GFR and A1c reviewed; Rx approved

## 2017-08-17 ENCOUNTER — Other Ambulatory Visit: Payer: Self-pay

## 2017-08-17 NOTE — Telephone Encounter (Signed)
Insurance requires pt to get 90 day supply of medication. Please advise.

## 2017-08-18 MED ORDER — SITAGLIPTIN PHOS-METFORMIN HCL 50-500 MG PO TABS: 1.0000 | ORAL_TABLET | Freq: Two times a day (BID) | ORAL | 3 refills | Status: DC

## 2017-08-18 NOTE — Telephone Encounter (Signed)
approved

## 2017-08-25 ENCOUNTER — Ambulatory Visit
Admission: RE | Admit: 2017-08-25 | Discharge: 2017-08-25 | Disposition: A | Discharge status: Home / Self Care | Payer: Medicare Other | Source: Ambulatory Visit | Attending: Urgent Care | Admitting: Urgent Care

## 2017-08-25 DIAGNOSIS — N200 Calculus of kidney: Secondary | ICD-10-CM | POA: Insufficient documentation

## 2017-08-25 DIAGNOSIS — C49A0 Gastrointestinal stromal tumor, unspecified site: Secondary | ICD-10-CM | POA: Diagnosis not present

## 2017-08-25 DIAGNOSIS — R590 Localized enlarged lymph nodes: Secondary | ICD-10-CM | POA: Insufficient documentation

## 2017-08-25 DIAGNOSIS — I700 Atherosclerosis of aorta: Secondary | ICD-10-CM | POA: Diagnosis not present

## 2017-08-25 DIAGNOSIS — Z853 Personal history of malignant neoplasm of breast: Secondary | ICD-10-CM

## 2017-09-02 ENCOUNTER — Other Ambulatory Visit: Payer: Self-pay | Admitting: Family Medicine

## 2017-09-02 NOTE — Progress Notes (Signed)
09/03/2017 10:19 AM   Rachael Castro 25-Dec-1930 [id number]  Referring provider: Arnetha Courser, MD [address] [place], [place] [address]  Chief Complaint  Patient presents with  . Nephrolithiasis    HPI: Patient is an 83 year old African American female who presents today for a one year follow up for nephrolithiasis.    Patient has not had any flank pain, blood in the urine or passage of fragments.  She has no other urinary symptoms at this time.  She has not had any recent fevers, chills, nausea or vomiting.    Non contrast CT performed on 08/25/2017 both adrenal glands appear normal. There are small nonobstructing calculi in the lower poles of both kidneys.  There is stable scarring in the upper pole of the left kidney and a probable small cyst in the upper pole of the right kidney. No evidence of ureteral calculus or hydronephrosis. Mild bladder wall thickening is present, stable.     PMH: Past Medical History:  Diagnosis Date  . b 1999   breast cancer  . Diabetes mellitus   . GERD (gastroesophageal reflux disease)   . Hypercholesteremia   . Hypertension   . Kidney stones   . 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 Neuromuscular disorder (HCC)    hx shingles  . Personal history of colonic polyps   . Thyroid disease     Surgical History: Past Surgical History:  Procedure Laterality Date  . ABDOMINAL HYSTERECTOMY    . APPENDECTOMY    . BREAST SURGERY Right 1999   mastectomy  . CATARACT EXTRACTION W/PHACO Left 04/30/2015   Procedure: CATARACT EXTRACTION PHACO AND INTRAOCULAR LENS PLACEMENT (IOC);  Surgeon: Estill Cotta, MD;  Location: [facility] ORS;  Service: Ophthalmology;  Laterality: Left;  [place] 01:39 AP% 23.3 CDE 40.42 fluid pack lot # [id number]  . COLONOSCOPY  2008   Sankar  . EUS  02/12/2012   Procedure: UPPER ENDOSCOPIC ULTRASOUND (EUS) LINEAR;  Surgeon: Milus Banister, MD;  Location: [facility];  Service:  Endoscopy;  Laterality: N/A;  radial linear  . EYE SURGERY Right 2013   cataract  . HERNIA REPAIR  2012  . LAPAROTOMY  2013   excision of gastric wall mass   . MASTECTOMY    . SALPINGOOPHORECTOMY Right 1979  . THYROIDECTOMY  1978  . TONSILLECTOMY    . TUBAL LIGATION    . UPPER GI ENDOSCOPY  2013    Home Medications:  Allergies as of 09/03/2017   No Known Allergies     Medication List        Accurate as of 09/03/17 10:19 AM. Always use your most recent med list.          aspirin 81 MG tablet Take 81 mg by mouth daily.   atorvastatin 40 MG tablet Commonly known as:  LIPITOR TAKE 1 TABLET (40 MG TOTAL) BY MOUTH AT BEDTIME.   bisacodyl 5 MG EC tablet Commonly known as:  DULCOLAX Take 5 mg daily as needed by mouth for moderate constipation.   calcium citrate-vitamin D 200-200 MG-UNIT Tabs Take 1 tablet by mouth daily.   gabapentin 300 MG capsule Commonly known as:  NEURONTIN Take 300 mg 3 (three) times daily by mouth.   losartan 25 MG tablet Commonly known as:  COZAAR TAKE 1 TABLET (25 MG TOTAL) BY MOUTH DAILY.   multivitamin tablet Take 1 tablet by mouth daily.   omeprazole 20 MG  capsule Commonly known as:  PRILOSEC TAKE 1 CAPSULE BY MOUTH EVERY DAY   sitaGLIPtin-metformin 50-500 MG tablet Commonly known as:  JANUMET Take 1 tablet by mouth 2 (two) times daily with a meal.   thiamine 100 MG tablet Commonly known as:  VITAMIN B-1 Take 100 mg by mouth daily.       Allergies: No Known Allergies  Family History: Family History  Problem Relation Age of Onset  . Cancer Mother        cervical  . Cancer Father        prostate  . Kidney Stones Brother   . Glaucoma Brother   . Prostatitis Brother   . Diabetes Sister   . Thyroid disease Brother   . Cancer Brother        thyroid  . Arthritis Brother   . Diabetes Sister   . Kidney disease Neg Hx   . Bladder Cancer Neg Hx     Social History:  reports that  has never smoked. she has never used  smokeless tobacco. She reports that she does not drink alcohol or use drugs.  ROS: UROLOGY Frequent Urination?: No Hard to postpone urination?: No Burning/pain with urination?: No Get up at night to urinate?: No Leakage of urine?: No Urine stream starts and stops?: No Trouble starting stream?: No Do you have to strain to urinate?: No Blood in urine?: No Urinary tract infection?: No Sexually transmitted disease?: No Injury to kidneys or bladder?: No Painful intercourse?: No Weak stream?: No Currently pregnant?: No Vaginal bleeding?: No  Gastrointestinal Nausea?: No Vomiting?: No Indigestion/heartburn?: No Diarrhea?: No Constipation?: No  Constitutional Fever: No Night sweats?: No Weight loss?: Yes Fatigue?: No  Skin Skin rash/lesions?: No Itching?: No  Eyes Blurred vision?: No Double vision?: No  Ears/Nose/Throat Sore throat?: No Sinus problems?: No  Hematologic/Lymphatic Swollen glands?: No Easy bruising?: No  Cardiovascular Leg swelling?: No Chest pain?: No  Respiratory Cough?: No Shortness of breath?: No  Endocrine Excessive thirst?: No  Musculoskeletal Back pain?: No Joint pain?: No  Neurological Headaches?: No Dizziness?: No  Psychologic Depression?: No Anxiety?: No  Physical Exam: BP 128/66   Pulse 84   Ht 5\' 3"  (1.6 m)   Wt 120 lb 6.4 oz (54.6 kg)   BMI 21.33 kg/m   Constitutional: Well nourished. Alert and oriented, No acute distress. HEENT: [place] AT, moist mucus membranes. Trachea midline, no masses. Cardiovascular: No clubbing, cyanosis, or edema. Respiratory: Normal respiratory effort, no increased work of breathing. GI: Abdomen is soft, non tender, non distended, no abdominal masses. Liver and spleen not palpable.  No hernias appreciated.  Stool sample for occult testing is not indicated.   GU: No CVA tenderness.  No bladder fullness or masses.   Skin: No rashes, bruises or suspicious lesions. Lymph: No cervical or  inguinal adenopathy. Neurologic: Grossly intact, no focal deficits, moving all 4 extremities. Psychiatric: Normal mood and affect.  Laboratory Data: Lab Results  Component Value Date   WBC 8.3 08/05/2017   HGB 11.4 (L) 08/05/2017   HCT 33.8 (L) 08/05/2017   MCV 95.5 08/05/2017   PLT 205 08/05/2017    Lab Results  Component Value Date   CREATININE 0.78 08/05/2017    Lab Results  Component Value Date   HGBA1C 6.2 (H) 06/08/2017       Component Value Date/Time   CHOL 155 06/08/2017 1032   CHOL 224 (H) 06/18/2015 1210   HDL 48 (L) 06/08/2017 1032   HDL 57 06/18/2015 1210  CHOLHDL 3.2 06/08/2017 1032   VLDL 24 06/08/2017 1032   LDLCALC 83 06/08/2017 1032   LDLCALC 136 (H) 06/18/2015 1210    Lab Results  Component Value Date   AST 23 08/05/2017   Lab Results  Component Value Date   ALT 17 08/05/2017   I have reviewed the labs.  Pertinent Imaging: CLINICAL DATA:  83 year old with history of right breast cancer (1999) and gastrointestinal stromal tumor (2013). No current complaints.  EXAM: CT CHEST, ABDOMEN AND PELVIS WITHOUT CONTRAST  TECHNIQUE: Multidetector CT imaging of the chest, abdomen and pelvis was performed following the standard protocol without IV contrast.  COMPARISON:  CT 06/17/2016 and 12/03/2015  FINDINGS: CT CHEST FINDINGS  Cardiovascular: Atherosclerosis of the coronary arteries, and to a lesser degree the great vessels and thoracic aorta. No acute vascular findings are seen on noncontrast imaging. The heart size is normal. There is no pericardial effusion.  Mediastinum/Nodes: Progressive enlargement of a left axillary node, measuring 12 mm short axis on image 13 (previously 10 mm). There are other smaller left axillary and subpectoral lymph nodes which have mildly enlarged, but are within normal limits for size. Patient is status post right axillary node dissection. There is no right axillary, internal mammary, mediastinal or  hilar adenopathy. Small mediastinal lymph nodes are stable. The thyroid gland, trachea and esophagus demonstrate no significant findings.  Lungs/Pleura: There is no pleural effusion. The lungs appear stable without suspicious findings. 2 mm right middle lobe nodule on image 63 is stable.  Musculoskeletal/Chest wall: No chest wall mass or suspicious osseous findings. 5 mm subcutaneous nodule laterally in the right chest wall on image 29 is stable.  CT ABDOMEN AND PELVIS FINDINGS  Hepatobiliary: The liver appears stable without focal abnormality as imaged in the noncontrast state. No evidence of gallstones, gallbladder wall thickening or biliary dilatation.  Pancreas: Mildly atrophied without focal abnormality. No surrounding inflammation or ductal dilatation.  Spleen: Normal in size without focal abnormality.  Adrenals/Urinary Tract: Both adrenal glands appear normal. There are small nonobstructing calculi in the lower poles of both kidneys. There is stable scarring in the upper pole of the left kidney and a probable small cyst in the upper pole of the right kidney. No evidence of ureteral calculus or hydronephrosis. Mild bladder wall thickening is present, stable.  Stomach/Bowel: No evidence of bowel wall thickening, distention or surrounding inflammatory change. Stable postsurgical changes in the stomach without evidence of recurrent mass lesion.  Vascular/Lymphatic: There are no enlarged abdominal or pelvic lymph nodes. Aortic and branch vessel atherosclerosis. No acute vascular findings on noncontrast imaging.  Reproductive: Hysterectomy. No evidence of adnexal mass. Probable pelvic floor laxity.  Other: Postsurgical changes in the anterior abdominal wall. No ascites or peritoneal nodularity.  Musculoskeletal: No acute or significant osseous findings.  IMPRESSION: 1. Further slight enlargement of a left axillary lymph node. This was biopsied under  ultrasound guidance 09/08/2016 and revealed benign lymphoid hyperplasia. 2. No other significant changes. No evidence of metastatic disease within the chest, abdomen or pelvis. 3. Stable incidental findings including bilateral nephrolithiasis and Aortic Atherosclerosis (ICD10-I70.0).   Electronically Signed   By: Richardean Sale M.D.   On: 08/25/2017 13:11  I have independently reviewed the films  Assessment & Plan:    1. Bilateral nephrolithiasis  - no intervention warranted at this time  - continue yearly KUB's  - contact us if she experiences flank pain or gross hematuria  - Abdomen 1 view (KUB); Future   - Advised to  contact our office or seek treatment in the ED if becomes febrile or pain/ vomiting are difficult control in order to arrange for emergent/urgent intervention   Return in about 1 year (around 09/03/2018) for KUB and office visit.  These notes generated with voice recognition software. I apologize for typographical errors.  Zara Council, [facility] [address] [place], [place] [address] [phone]

## 2017-09-02 NOTE — Telephone Encounter (Signed)
Last creatinine and K+ reviewed; Rx approved

## 2017-09-03 ENCOUNTER — Other Ambulatory Visit: Payer: Self-pay

## 2017-09-03 ENCOUNTER — Encounter: Payer: Self-pay | Admitting: Urology

## 2017-09-03 ENCOUNTER — Ambulatory Visit: Payer: Medicare Other | Admitting: Urology

## 2017-09-03 VITALS — BP 128/66 | HR 84 | Ht 63.0 in | Wt 120.4 lb

## 2017-09-03 DIAGNOSIS — N200 Calculus of kidney: Secondary | ICD-10-CM | POA: Diagnosis not present

## 2017-09-03 MED ORDER — ATORVASTATIN CALCIUM 40 MG PO TABS: 40.0000 mg | ORAL_TABLET | Freq: Every day | ORAL | 2 refills | Status: DC

## 2017-09-03 NOTE — Telephone Encounter (Signed)
Ins. Request 90 days.

## 2017-09-04 ENCOUNTER — Ambulatory Visit
Admission: RE | Admit: 2017-09-04 | Discharge: 2017-09-04 | Disposition: A | Discharge status: Home / Self Care | Payer: Medicare Other | Source: Ambulatory Visit | Attending: General Surgery | Admitting: General Surgery

## 2017-09-04 DIAGNOSIS — Z1231 Encounter for screening mammogram for malignant neoplasm of breast: Secondary | ICD-10-CM | POA: Diagnosis not present

## 2017-09-04 HISTORY — DX: Malignant neoplasm of unspecified site of unspecified female breast: C50.919

## 2017-09-04 HISTORY — DX: Personal history of antineoplastic chemotherapy: Z92.21

## 2017-09-15 ENCOUNTER — Ambulatory Visit: Payer: Medicare Other | Admitting: General Surgery

## 2017-09-15 ENCOUNTER — Encounter: Payer: Self-pay | Admitting: General Surgery

## 2017-09-15 VITALS — BP 118/62 | HR 68 | Resp 14 | Ht 63.0 in | Wt 123.0 lb

## 2017-09-15 DIAGNOSIS — Z8509 Personal history of malignant neoplasm of other digestive organs: Secondary | ICD-10-CM | POA: Diagnosis not present

## 2017-09-15 DIAGNOSIS — Z853 Personal history of malignant neoplasm of breast: Secondary | ICD-10-CM

## 2017-09-15 DIAGNOSIS — Z86010 Personal history of colonic polyps: Secondary | ICD-10-CM

## 2017-09-15 NOTE — Patient Instructions (Addendum)
The patient is aware to call back for any questions or concerns. Patient will be asked to return to the office in one year with a left screening mammogram with Dr Bary Castilla. Also needs colonoscopy next year. Continue follow up with oncologist.

## 2017-09-15 NOTE — Progress Notes (Signed)
Patient ID: Rachael Castro, female   DOB: 1931/03/11, 83 y.o.   MRN: [id number]  Chief Complaint  Patient presents with  . Follow-up    HPI Rachael Castro is a 83 y.o. female who presents for a breast evaluation. The most recent mammogram was done on 09/04/2017.  Patient does perform regular self breast checks and gets regular mammograms done.    HPI  Past Medical History:  Diagnosis Date  . b 1999   breast cancer  . Breast cancer ([place]) 1999   right breast  . Diabetes mellitus   . GERD (gastroesophageal reflux disease)   . Hypercholesteremia   . Hypertension   . Kidney stones   . 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 Neuromuscular disorder (HCC)    hx shingles  . Personal history of chemotherapy   . Personal history of colonic polyps   . Thyroid disease     Past Surgical History:  Procedure Laterality Date  . ABDOMINAL HYSTERECTOMY    . APPENDECTOMY    . BREAST BIOPSY Left 09/08/2016   Done by Dr. Jamal Collin  . BREAST SURGERY Right 1999   mastectomy  . CATARACT EXTRACTION W/PHACO Left 04/30/2015   Procedure: CATARACT EXTRACTION PHACO AND INTRAOCULAR LENS PLACEMENT (IOC);  Surgeon: Estill Cotta, MD;  Location: [facility] ORS;  Service: Ophthalmology;  Laterality: Left;  [place] 01:39 AP% 23.3 CDE 40.42 fluid pack lot # [id number]  . COLONOSCOPY  2008   Gailyn Crook  . EUS  02/12/2012   Procedure: UPPER ENDOSCOPIC ULTRASOUND (EUS) LINEAR;  Surgeon: Milus Banister, MD;  Location: [facility];  Service: Endoscopy;  Laterality: N/A;  radial linear  . EYE SURGERY Right 2013   cataract  . HERNIA REPAIR  2012  . LAPAROTOMY  2013   excision of gastric wall mass   . MASTECTOMY    . SALPINGOOPHORECTOMY Right 1979  . THYROIDECTOMY  1978  . TONSILLECTOMY    . TUBAL LIGATION    . UPPER GI ENDOSCOPY  2013    Family History  Problem Relation Age of Onset  . Cancer Mother        cervical  . Cancer Father        prostate  . Kidney Stones Brother    . Glaucoma Brother   . Prostatitis Brother   . Diabetes Sister   . Thyroid disease Brother   . Cancer Brother        thyroid  . Arthritis Brother   . Diabetes Sister   . Kidney disease Neg Hx   . Bladder Cancer Neg Hx     Social History Social History   Tobacco Use  . Smoking status: Never Smoker  . Smokeless tobacco: Never Used  Substance Use Topics  . Alcohol use: No  . Drug use: No    No Known Allergies  Current Outpatient Medications  Medication Sig Dispense Refill  . aspirin 81 MG tablet Take 81 mg by mouth daily.    Marland Kitchen atorvastatin (LIPITOR) 40 MG tablet Take 1 tablet (40 mg total) at bedtime by mouth. 90 tablet 2  . bisacodyl (DULCOLAX) 5 MG EC tablet Take 5 mg daily as needed by mouth for moderate constipation.    . calcium citrate-vitamin D 200-200 MG-UNIT TABS Take 1 tablet by mouth daily.    Marland Kitchen gabapentin (NEURONTIN) 300 MG capsule Take 300 mg 3 (three) times daily by mouth.    . losartan (COZAAR) 25 MG tablet TAKE 1  TABLET (25 MG TOTAL) BY MOUTH DAILY. 30 tablet 6  . Multiple Vitamin (MULTIVITAMIN) tablet Take 1 tablet by mouth daily.    Marland Kitchen omeprazole (PRILOSEC) 20 MG capsule TAKE 1 CAPSULE BY MOUTH EVERY DAY 30 capsule 6  . sitaGLIPtin-metformin (JANUMET) 50-500 MG tablet Take 1 tablet by mouth 2 (two) times daily with a meal. 180 tablet 3  . thiamine (VITAMIN B-1) 100 MG tablet Take 100 mg by mouth daily.     No current facility-administered medications for this visit.     Review of Systems Review of Systems  Constitutional: Negative.   Respiratory: Negative.   Cardiovascular: Negative.     Blood pressure 118/62, pulse 68, resp. rate 14, height 5\' 3"  (1.6 m), weight 123 lb (55.8 kg).  Physical Exam Physical Exam  Constitutional: She is oriented to person, place, and time. She appears well-developed and well-nourished.  HENT:  Mouth/Throat: Oropharynx is clear and moist.  Eyes: Conjunctivae are normal. No scleral icterus.  Neck: Neck supple.   Cardiovascular: Normal rate, regular rhythm and normal heart sounds.  Pulmonary/Chest: Effort normal and breath sounds normal. No respiratory distress. Left breast exhibits inverted nipple (chronic, stable). Left breast exhibits no mass, no nipple discharge, no skin change and no tenderness.    Abdominal: Soft. Bowel sounds are normal. She exhibits no mass. There is no hepatomegaly. There is no tenderness.  Lymphadenopathy:    She has no cervical adenopathy.    She has no axillary adenopathy.  Neurological: She is alert and oriented to person, place, and time.  Skin: Skin is warm and dry.  Psychiatric: Her behavior is normal.    Data Reviewed Mammogram, previous notes, previous colonoscopy Mammogram revealed no evidence of malignancy.  Assessment   Right breast cancer in 1999 and GIST in 2013. Exam and mammogram stable.  History of colon polyps. Due for colonoscopy next year.    Plan    Patient will be asked to return to the office in one year with a left screening mammogram with Dr Bary Castilla. Also needs colonoscopy next year. Continue follow up with oncologist.       HPI, Physical Exam, Assessment and Plan have been scribed under the direction and in the presence of Mckinley Jewel, MD Karie Fetch, RN  I have completed the exam and reviewed the above documentation for accuracy and completeness.  I agree with the above.  Haematologist has been used and any errors in dictation or transcription are unintentional.  Abbegayle Denault G. Jamal Collin, M.D., F.A.C.S.   Junie Panning G 09/15/2017, 12:50 PM

## 2017-09-16 LAB — HM DIABETES EYE EXAM

## 2017-09-21 ENCOUNTER — Encounter: Payer: Self-pay | Admitting: Family Medicine

## 2017-09-21 ENCOUNTER — Ambulatory Visit (INDEPENDENT_AMBULATORY_CARE_PROVIDER_SITE_OTHER): Payer: Medicare Other | Admitting: Family Medicine

## 2017-09-21 VITALS — BP 138/64 | HR 68 | Temp 98.3°F | Resp 14 | Ht 63.0 in | Wt 124.1 lb

## 2017-09-21 DIAGNOSIS — Z86010 Personal history of colonic polyps: Secondary | ICD-10-CM

## 2017-09-21 DIAGNOSIS — E1165 Type 2 diabetes mellitus with hyperglycemia: Secondary | ICD-10-CM

## 2017-09-21 DIAGNOSIS — Z8619 Personal history of other infectious and parasitic diseases: Secondary | ICD-10-CM

## 2017-09-21 DIAGNOSIS — Z7189 Other specified counseling: Secondary | ICD-10-CM | POA: Diagnosis not present

## 2017-09-21 DIAGNOSIS — Z0000 Encounter for general adult medical examination without abnormal findings: Secondary | ICD-10-CM

## 2017-09-21 NOTE — Assessment & Plan Note (Signed)
Surgeon has discussed having her get a colonoscopy; she says that will be arranged with her surgeon

## 2017-09-21 NOTE — Progress Notes (Signed)
Patient: Rachael Castro, Female    DOB: 1931/05/29, 83 y.o.   MRN: [id number]  Visit Date: 09/21/2017  Today's Provider: Enid Derry, MD   Chief Complaint  Patient presents with  . Medicare Wellness    Subjective:   Rachael Castro is a 83 y.o. female who presents today for her Subsequent Annual Wellness Visit.  USPSTF grade A and B recommendations Depression:  Depression screen [facility] 2/9 09/21/2017 08/07/2017 04/07/2017 12/05/2016 09/16/2016  Decreased Interest 0 0 0 0 0  Down, Depressed, Hopeless 0 0 0 0 0  PHQ - 2 Score 0 0 0 0 0   Hypertension: BP Readings from Last 3 Encounters:  09/21/17 138/64  09/15/17 118/62  09/03/17 128/66   Obesity: Wt Readings from Last 3 Encounters:  09/21/17 124 lb 1.6 oz (56.3 kg)  09/15/17 123 lb (55.8 kg)  09/03/17 120 lb 6.4 oz (54.6 kg)   BMI Readings from Last 3 Encounters:  09/21/17 21.98 kg/m  09/15/17 21.79 kg/m  09/03/17 21.33 kg/m    6CIT Screen 09/21/2017  What Year? 0 points  What month? 0 points  What time? 0 points  Count back from 20 0 points  Months in reverse 0 points  Repeat phrase 2 points  Total Score 2    Skin cancer: no worrisome moles Lung cancer:  nonsmoker Breast cancer: seeing surgeon Colorectal cancer: n/a; Dr. 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; they will arrange that through surgeon's office  HIV, hep B, hep C: not interested Intimate partner violence: no abuse Osteoporosis: patient will schedule dexa Fall prevention/vitamin D: discussed  Diet: getting enough fruits and veggies, dark greens Exercise: walks in the garden Alcohol: no Tobacco use: no Aspirin: low dose aspirin, coated to protect stomach Lipids:  Lab Results  Component Value Date   CHOL 155 06/08/2017   CHOL 184 12/05/2016   CHOL 193 09/01/2016   Lab Results  Component Value Date   HDL 48 (L) 06/08/2017   HDL 51 12/05/2016   HDL 54 09/01/2016   Lab Results  Component  Value Date   LDLCALC 83 06/08/2017   LDLCALC 111 (H) 12/05/2016   LDLCALC 117 (H) 09/01/2016   Lab Results  Component Value Date   TRIG 120 06/08/2017   TRIG 108 12/05/2016   TRIG 109 09/01/2016   Lab Results  Component Value Date   CHOLHDL 3.2 06/08/2017   CHOLHDL 3.6 12/05/2016   CHOLHDL 3.6 09/01/2016   No results found for: LDLDIRECT Glucose:  Glucose  Date Value Ref Range Status  02/14/2015 141 (H) mg/dL Final    Comment:    65-99 NOTE: New Reference Range  12/26/14   07/28/2014 105 (H) 65 - 99 mg/dL Final  07/05/2014 146 (H) 65 - 99 mg/dL Final   Glucose, Bld  Date Value Ref Range Status  08/05/2017 185 (H) 65 - 99 mg/dL Final  06/08/2017 100 (H) 65 - 99 mg/dL Final  02/04/2017 131 (H) 65 - 99 mg/dL Final   Glucose-Capillary  Date Value Ref Range Status  04/30/2015 120 (H) 65 - 99 mg/dL Final  02/12/2012 90 70 - 99 mg/dL Final   AAA:n/a  Review of Systems  Genitourinary:       No incontinence    Past Medical History:  Diagnosis Date  . Breast cancer ([place]) 1999   right breast  . Diabetes mellitus   . GERD (gastroesophageal reflux disease)   . History of shingles   .  Hypercholesteremia   . Hypertension   . Kidney stones   . 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 Personal history of chemotherapy   . Personal history of colonic polyps   . Thyroid disease     Past Surgical History:  Procedure Laterality Date  . ABDOMINAL HYSTERECTOMY    . APPENDECTOMY    . BREAST BIOPSY Left 09/08/2016   Done by Dr. Jamal Collin  . BREAST SURGERY Right 1999   mastectomy  . CATARACT EXTRACTION W/PHACO Left 04/30/2015   Procedure: CATARACT EXTRACTION PHACO AND INTRAOCULAR LENS PLACEMENT (IOC);  Surgeon: Estill Cotta, MD;  Location: [facility] ORS;  Service: Ophthalmology;  Laterality: Left;  [place] 01:39 AP% 23.3 CDE 40.42 fluid pack lot # [id number]  . COLONOSCOPY  2008   Sankar  . EUS  02/12/2012   Procedure: UPPER ENDOSCOPIC ULTRASOUND  (EUS) LINEAR;  Surgeon: Milus Banister, MD;  Location: [facility] ENDOSCOPY;  Service: Endoscopy;  Laterality: N/A;  radial linear  . EYE SURGERY Right 2013   cataract  . HERNIA REPAIR  2012  . LAPAROTOMY  2013   excision of gastric wall mass   . MASTECTOMY    . SALPINGOOPHORECTOMY Right 1979  . THYROIDECTOMY  1978  . TONSILLECTOMY    . TUBAL LIGATION    . UPPER GI ENDOSCOPY  2013    Family History  Problem Relation Age of Onset  . Cancer Mother        cervical  . Cancer Father        prostate  . Kidney Stones Brother   . Glaucoma Brother   . Prostatitis Brother   . Diabetes Sister   . Thyroid disease Brother   . Cancer Brother        thyroid  . Arthritis Brother   . Diabetes Sister   . Kidney disease Neg Hx   . Bladder Cancer Neg Hx     Social History   Tobacco Use  . Smoking status: Never Smoker  . Smokeless tobacco: Never Used  Substance Use Topics  . Alcohol use: No  . Drug use: No    Outpatient Encounter Medications as of 09/21/2017  Medication Sig  . aspirin 81 MG tablet Take 81 mg by mouth daily.  Marland Kitchen atorvastatin (LIPITOR) 40 MG tablet Take 1 tablet (40 mg total) at bedtime by mouth.  . calcium citrate-vitamin D 200-200 MG-UNIT TABS Take 1 tablet by mouth daily.  Marland Kitchen gabapentin (NEURONTIN) 300 MG capsule Take 300 mg by mouth as needed.   Marland Kitchen losartan (COZAAR) 25 MG tablet TAKE 1 TABLET (25 MG TOTAL) BY MOUTH DAILY.  . Multiple Vitamin (MULTIVITAMIN) tablet Take 1 tablet by mouth daily.  Marland Kitchen omeprazole (PRILOSEC) 20 MG capsule TAKE 1 CAPSULE BY MOUTH EVERY DAY  . sitaGLIPtin-metformin (JANUMET) 50-500 MG tablet Take 1 tablet by mouth 2 (two) times daily with a meal.  . thiamine (VITAMIN B-1) 100 MG tablet Take 100 mg by mouth daily.  . bisacodyl (DULCOLAX) 5 MG EC tablet Take 5 mg daily as needed by mouth for moderate constipation.   No facility-administered encounter medications on file as of 09/21/2017.     Functional Ability / Safety Screening 1.  Was the timed  Get Up and Go test less than 12 seconds?  yes 2.  Does the patient need help with the phone, transportation, shopping,      preparing meals, housework, laundry, medications, or managing money?  no 3.  Does the patient's  home have:  loose throw rugs in the hallway?   no      Grab bars in the bathroom? yes      Handrails on the stairs?   one doesn't have railings, 2 steps down      Good lighting?   yes 4.  Has the patient noticed any hearing difficulties?   no  Advanced Directives Does patient have a HCPOA?    no  But she has the papers to do it If yes, name and contact information: n/a Does patient have a living will or MOST form?  no She will start thinking about this  Immunizations: pneumonia vaccine in the hospital, they gave her one here; shingles vaccines recommended to be given at pharmacy  Fall Risk Assessment See under rooming  Depression Screen See under rooming Depression screen [facility] 2/9 09/21/2017 08/07/2017 04/07/2017 12/05/2016 09/16/2016  Decreased Interest 0 0 0 0 0  Down, Depressed, Hopeless 0 0 0 0 0  PHQ - 2 Score 0 0 0 0 0    Objective:   Vitals: BP 138/64   Pulse 68   Temp 98.3 F (36.8 C) (Oral)   Resp 14   Ht 5\' 3"  (1.6 m)   Wt 124 lb 1.6 oz (56.3 kg)   SpO2 97%   BMI 21.98 kg/m  Body mass index is 21.98 kg/m. No exam data present  Physical Exam Mood/affect:  euthymic Appearance:  neat  6CIT Screen 09/21/2017  What Year? 0 points  What month? 0 points  What time? 0 points  Count back from 20 0 points  Months in reverse 0 points  Repeat phrase 2 points  Total Score 2    Assessment & Plan:     Annual Wellness Visit  Reviewed patient's Family Medical History Reviewed and updated list of patient's medical providers Assessment of cognitive impairment was done Assessed patient's functional ability Established a written schedule for health screening [place] Completed and Reviewed  Exercise Activities and Dietary  recommendations Goals    . Prevent falls       Immunization History  Administered Date(s) Administered  . Influenza, High Dose Seasonal PF 06/18/2015, 09/04/2016  . Influenza-Unspecified 07/23/2017    Health Maintenance  Topic Date Due  . DEXA SCAN  01/14/2015  . HEMOGLOBIN A1C  12/09/2017  . COLONOSCOPY  07/13/2018  . FOOT EXAM  08/07/2018  . MAMMOGRAM  09/04/2018  . OPHTHALMOLOGY EXAM  09/16/2018  . TETANUS/TDAP  04/26/2022  . INFLUENZA VACCINE  Completed  . PNA vac Low Risk Adult  Completed    Discussed health benefits of physical activity, and encouraged her to engage in regular exercise appropriate for her age and condition.   No orders of the defined types were placed in this encounter.   Current Outpatient Medications:  .  aspirin 81 MG tablet, Take 81 mg by mouth daily., Disp: , Rfl:  .  atorvastatin (LIPITOR) 40 MG tablet, Take 1 tablet (40 mg total) at bedtime by mouth., Disp: 90 tablet, Rfl: 2 .  calcium citrate-vitamin D 200-200 MG-UNIT TABS, Take 1 tablet by mouth daily., Disp: , Rfl:  .  gabapentin (NEURONTIN) 300 MG capsule, Take 300 mg by mouth as needed. , Disp: , Rfl:  .  losartan (COZAAR) 25 MG tablet, TAKE 1 TABLET (25 MG TOTAL) BY MOUTH DAILY., Disp: 30 tablet, Rfl: 6 .  Multiple Vitamin (MULTIVITAMIN) tablet, Take 1 tablet by mouth daily., Disp: , Rfl:  .  omeprazole (PRILOSEC) 20  MG capsule, TAKE 1 CAPSULE BY MOUTH EVERY DAY, Disp: 30 capsule, Rfl: 6 .  sitaGLIPtin-metformin (JANUMET) 50-500 MG tablet, Take 1 tablet by mouth 2 (two) times daily with a meal., Disp: 180 tablet, Rfl: 3 .  thiamine (VITAMIN B-1) 100 MG tablet, Take 100 mg by mouth daily., Disp: , Rfl:  .  bisacodyl (DULCOLAX) 5 MG EC tablet, Take 5 mg daily as needed by mouth for moderate constipation., Disp: , Rfl:  There are no discontinued medications.  Next Medicare Wellness Visit in 12+ months  Problem List Items Addressed This Visit      Endocrine   Type 2 diabetes mellitus  ([place])    Foot exam today        Other   History of shingles    Discussed shingles vaccine called Shingrix; she can get this at the pharmacy      History of colonic polyps    Surgeon has discussed having her get a colonoscopy; she says that will be arranged with her surgeon      Encounter for Medicare annual wellness exam - Primary    USPSTF grade A and B recommendations reviewed with patient; age-appropriate recommendations, preventive care, screening tests, etc discussed and encouraged; healthy living encouraged; see AVS for patient education given to patient        Other Visit Diagnoses    Advanced care planning/counseling discussion       patient is not ready to commit; she has the paperwork; I encouraged her to consider her wishes, communicate with us/family

## 2017-09-21 NOTE — Assessment & Plan Note (Signed)
USPSTF grade A and B recommendations reviewed with patient; age-appropriate recommendations, preventive care, screening tests, etc discussed and encouraged; healthy living encouraged; see AVS for patient education given to patient  

## 2017-09-21 NOTE — Patient Instructions (Addendum)
Please do call to schedule your bone density study; the number to schedule one at either [facility] or [facility] is [phone] or [phone]  You are up-to-date on both of your pneumonia shots There is a new shingles vaccine called Shingrix, and you can talk to your pharmacist about this; it is different from the old shingles vaccine  Health Maintenance  Topic Date Due  . DEXA SCAN  01/14/2015  . HEMOGLOBIN A1C  12/09/2017  . COLONOSCOPY  07/13/2018  . FOOT EXAM  08/07/2018  . MAMMOGRAM  09/04/2018  . OPHTHALMOLOGY EXAM  09/16/2018  . TETANUS/TDAP  04/26/2022  . INFLUENZA VACCINE  Completed  . PNA vac Low Risk Adult  Completed   DEXA scan is due now Health Maintenance, Female Adopting a healthy lifestyle and getting preventive care can go a long way to promote health and wellness. Talk with your health care provider about what schedule of regular examinations is right for you. This is a good chance for you to check in with your provider about disease prevention and staying healthy. In between checkups, there are plenty of things you can do on your own. Experts have done a lot of research about which lifestyle changes and preventive measures are most likely to keep you healthy. Ask your health care provider for more information. Weight and diet Eat a healthy diet  Be sure to include plenty of vegetables, fruits, low-fat dairy products, and lean protein.  Do not eat a lot of foods high in solid fats, added sugars, or salt.  Get regular exercise. This is one of the most important things you can do for your health. ? Most adults should exercise for at least 150 minutes each week. The exercise should increase your heart rate and make you sweat (moderate-intensity exercise). ? Most adults should also do strengthening exercises at least twice a week. This is in addition to the moderate-intensity exercise.  Maintain a healthy weight  Body mass index  (BMI) is a measurement that can be used to identify possible weight problems. It estimates body fat based on height and weight. Your health care provider can help determine your BMI and help you achieve or maintain a healthy weight.  For females 67 years of age and older: ? A BMI below 18.5 is considered underweight. ? A BMI of 18.5 to 24.9 is normal. ? A BMI of 25 to 29.9 is considered overweight. ? A BMI of 30 and above is considered obese.  Watch levels of cholesterol and blood lipids  You should start having your blood tested for lipids and cholesterol at 81 years of age, then have this test every 5 years.  You may need to have your cholesterol levels checked more often if: ? Your lipid or cholesterol levels are high. ? You are older than 81 years of age. ? You are at high risk for heart disease.  Cancer screening Lung Cancer  Lung cancer screening is recommended for adults 21-33 years old who are at high risk for lung cancer because of a history of smoking.  A yearly low-dose CT scan of the lungs is recommended for people who: ? Currently smoke. ? Have quit within the past 15 years. ? Have at least a 30-pack-year history of smoking. A pack year is smoking an average of one pack of cigarettes a day for 1 year.  Yearly screening should continue until it has been 15 years since you quit.  Yearly screening should stop  if you develop a health problem that would prevent you from having lung cancer treatment.  Breast Cancer  Practice breast self-awareness. This means understanding how your breasts normally appear and feel.  It also means doing regular breast self-exams. Let your health care provider know about any changes, no matter how small.  If you are in your 20s or 30s, you should have a clinical breast exam (CBE) by a health care provider every 1-3 years as part of a regular health exam.  If you are 38 or older, have a CBE every year. Also consider having a breast X-ray  (mammogram) every year.  If you have a family history of breast cancer, talk to your health care provider about genetic screening.  If you are at high risk for breast cancer, talk to your health care provider about having an MRI and a mammogram every year.  Breast cancer gene (BRCA) assessment is recommended for women who have family members with BRCA-related cancers. BRCA-related cancers include: ? Breast. ? Ovarian. ? Tubal. ? Peritoneal cancers.  Results of the assessment will determine the need for genetic counseling and BRCA1 and BRCA2 testing.  Cervical Cancer Your health care provider may recommend that you be screened regularly for cancer of the pelvic organs (ovaries, uterus, and vagina). This screening involves a pelvic examination, including checking for microscopic changes to the surface of your cervix (Pap test). You may be encouraged to have this screening done every 3 years, beginning at age 77.  For women ages 15-65, health care providers may recommend pelvic exams and Pap testing every 3 years, or they may recommend the Pap and pelvic exam, combined with testing for human papilloma virus (HPV), every 5 years. Some types of HPV increase your risk of cervical cancer. Testing for HPV may also be done on women of any age with unclear Pap test results.  Other health care providers may not recommend any screening for nonpregnant women who are considered low risk for pelvic cancer and who do not have symptoms. Ask your health care provider if a screening pelvic exam is right for you.  If you have had past treatment for cervical cancer or a condition that could lead to cancer, you need Pap tests and screening for cancer for at least 20 years after your treatment. If Pap tests have been discontinued, your risk factors (such as having a new sexual partner) need to be reassessed to determine if screening should resume. Some women have medical problems that increase the chance of getting  cervical cancer. In these cases, your health care provider may recommend more frequent screening and Pap tests.  Colorectal Cancer  This type of cancer can be detected and often prevented.  Routine colorectal cancer screening usually begins at 81 years of age and continues through 81 years of age.  Your health care provider may recommend screening at an earlier age if you have risk factors for colon cancer.  Your health care provider may also recommend using home test kits to check for hidden blood in the stool.  A small camera at the end of a tube can be used to examine your colon directly (sigmoidoscopy or colonoscopy). This is done to check for the earliest forms of colorectal cancer.  Routine screening usually begins at age 39.  Direct examination of the colon should be repeated every 5-10 years through 81 years of age. However, you may need to be screened more often if early forms of precancerous polyps or small growths  are found.  Skin Cancer  Check your skin from head to toe regularly.  Tell your health care provider about any new moles or changes in moles, especially if there is a change in a mole's shape or color.  Also tell your health care provider if you have a mole that is larger than the size of a pencil eraser.  Always use sunscreen. Apply sunscreen liberally and repeatedly throughout the day.  Protect yourself by wearing long sleeves, pants, a wide-brimmed hat, and sunglasses whenever you are outside.  Heart disease, diabetes, and high blood pressure  High blood pressure causes heart disease and increases the risk of stroke. High blood pressure is more likely to develop in: ? People who have blood pressure in the high end of the normal range (130-139/85-89 mm Hg). ? People who are overweight or obese. ? People who are African American.  If you are 54-16 years of age, have your blood pressure checked every 3-5 years. If you are 83 years of age or older, have your  blood pressure checked every year. You should have your blood pressure measured twice-once when you are at a hospital or clinic, and once when you are not at a hospital or clinic. Record the average of the two measurements. To check your blood pressure when you are not at a hospital or clinic, you can use: ? An automated blood pressure machine at a pharmacy. ? A home blood pressure monitor.  If you are between 57 years and 48 years old, ask your health care provider if you should take aspirin to prevent strokes.  Have regular diabetes screenings. This involves taking a blood sample to check your fasting blood sugar level. ? If you are at a normal weight and have a low risk for diabetes, have this test once every three years after 81 years of age. ? If you are overweight and have a high risk for diabetes, consider being tested at a younger age or more often. Preventing infection Hepatitis B  If you have a higher risk for hepatitis B, you should be screened for this virus. You are considered at high risk for hepatitis B if: ? You were born in a country where hepatitis B is common. Ask your health care provider which countries are considered high risk. ? Your parents were born in a high-risk country, and you have not been immunized against hepatitis B (hepatitis B vaccine). ? You have HIV or AIDS. ? You use needles to inject street drugs. ? You live with someone who has hepatitis B. ? You have had sex with someone who has hepatitis B. ? You get hemodialysis treatment. ? You take certain medicines for conditions, including cancer, organ transplantation, and autoimmune conditions.  Hepatitis C  Blood testing is recommended for: ? Everyone born from [birth year] through 1963. ? Anyone with known risk factors for hepatitis C.  Sexually transmitted infections (STIs)  You should be screened for sexually transmitted infections (STIs) including gonorrhea and chlamydia if: ? You are sexually active and  are younger than 81 years of age. ? You are older than 81 years of age and your health care provider tells you that you are at risk for this type of infection. ? Your sexual activity has changed since you were last screened and you are at an increased risk for chlamydia or gonorrhea. Ask your health care provider if you are at risk.  If you do not have HIV, but are at risk, it may be  recommended that you take a prescription medicine daily to prevent HIV infection. This is called pre-exposure prophylaxis (PrEP). You are considered at risk if: ? You are sexually active and do not regularly use condoms or know the HIV status of your partner(s). ? You take drugs by injection. ? You are sexually active with a partner who has HIV.  Talk with your health care provider about whether you are at high risk of being infected with HIV. If you choose to begin PrEP, you should first be tested for HIV. You should then be tested every 3 months for as long as you are taking PrEP. Pregnancy  If you are premenopausal and you may become pregnant, ask your health care provider about preconception counseling.  If you may become pregnant, take 400 to 800 micrograms (mcg) of folic acid every day.  If you want to prevent pregnancy, talk to your health care provider about birth control (contraception). Osteoporosis and menopause  Osteoporosis is a disease in which the bones lose minerals and strength with aging. This can result in serious bone fractures. Your risk for osteoporosis can be identified using a bone density scan.  If you are 46 years of age or older, or if you are at risk for osteoporosis and fractures, ask your health care provider if you should be screened.  Ask your health care provider whether you should take a calcium or vitamin D supplement to lower your risk for osteoporosis.  Menopause may have certain physical symptoms and risks.  Hormone replacement therapy may reduce some of these symptoms and  risks. Talk to your health care provider about whether hormone replacement therapy is right for you. Follow these instructions at home:  Schedule regular health, dental, and eye exams.  Stay current with your immunizations.  Do not use any tobacco products including cigarettes, chewing tobacco, or electronic cigarettes.  If you are pregnant, do not drink alcohol.  If you are breastfeeding, limit how much and how often you drink alcohol.  Limit alcohol intake to no more than 1 drink per day for nonpregnant women. One drink equals 12 ounces of beer, 5 ounces of wine, or 1 ounces of hard liquor.  Do not use street drugs.  Do not share needles.  Ask your health care provider for help if you need support or information about quitting drugs.  Tell your health care provider if you often feel depressed.  Tell your health care provider if you have ever been abused or do not feel safe at home. This information is not intended to replace advice given to you by your health care provider. Make sure you discuss any questions you have with your health care provider. Document Released: 04/21/2011 Document Revised: 03/13/2016 Document Reviewed: 07/10/2015 Elsevier Interactive Patient Education  [employer].

## 2017-09-21 NOTE — Assessment & Plan Note (Signed)
Discussed shingles vaccine called Shingrix; she can get this at the pharmacy

## 2017-09-21 NOTE — Progress Notes (Signed)
BP 138/64   Pulse 68   Temp 98.3 F (36.8 C) (Oral)   Resp 14   Ht 5\' 3"  (1.6 m)   Wt 124 lb 1.6 oz (56.3 kg)   SpO2 97%   BMI 21.98 kg/m    Subjective:    Patient ID: Rachael Castro, female    DOB: 05-05-1931, 83 y.o.   MRN: [id number]  HPI: Rachael Castro is a 83 y.o. female  Chief Complaint  Patient presents with  . Medicare Wellness    HPI Patient is here for f/u  High cholesterol Lab Results  Component Value Date   CHOL 155 06/08/2017   HDL 48 (L) 06/08/2017   LDLCALC 83 06/08/2017   TRIG 120 06/08/2017   CHOLHDL 3.2 06/08/2017    Type 2 diabetes Lab Results  Component Value Date   HGBA1C 6.2 (H) 06/08/2017     Depression screen PHQ 2/9 09/21/2017 08/07/2017 04/07/2017 12/05/2016 09/16/2016  Decreased Interest 0 0 0 0 0  Down, Depressed, Hopeless 0 0 0 0 0  PHQ - 2 Score 0 0 0 0 0    Relevant past medical, surgical, family and social history reviewed Past Medical History:  Diagnosis Date  . b 1999   breast cancer  . Breast cancer ([place]) 1999   right breast  . Diabetes mellitus   . GERD (gastroesophageal reflux disease)   . Hypercholesteremia   . Hypertension   . Kidney stones   . 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 Neuromuscular disorder (HCC)    hx shingles  . Personal history of chemotherapy   . Personal history of colonic polyps   . Thyroid disease    Past Surgical History:  Procedure Laterality Date  . ABDOMINAL HYSTERECTOMY    . APPENDECTOMY    . BREAST BIOPSY Left 09/08/2016   Done by Dr. Jamal Collin  . BREAST SURGERY Right 1999   mastectomy  . CATARACT EXTRACTION W/PHACO Left 04/30/2015   Procedure: CATARACT EXTRACTION PHACO AND INTRAOCULAR LENS PLACEMENT (IOC);  Surgeon: Estill Cotta, MD;  Location: [facility];  Service: Ophthalmology;  Laterality: Left;  [place] 01:39 AP% 23.3 CDE 40.42 fluid pack lot # [id number]  . COLONOSCOPY  2008   Sankar  . EUS  02/12/2012   Procedure: UPPER ENDOSCOPIC  ULTRASOUND (EUS) LINEAR;  Surgeon: Milus Banister, MD;  Location: [facility];  Service: Endoscopy;  Laterality: N/A;  radial linear  . EYE SURGERY Right 2013   cataract  . HERNIA REPAIR  2012  . LAPAROTOMY  2013   excision of gastric wall mass   . MASTECTOMY    . SALPINGOOPHORECTOMY Right 1979  . THYROIDECTOMY  1978  . TONSILLECTOMY    . TUBAL LIGATION    . UPPER GI ENDOSCOPY  2013   Family History  Problem Relation Age of Onset  . Cancer Mother        cervical  . Cancer Father        prostate  . Kidney Stones Brother   . Glaucoma Brother   . Prostatitis Brother   . Diabetes Sister   . Thyroid disease Brother   . Cancer Brother        thyroid  . Arthritis Brother   . Diabetes Sister   . Kidney disease Neg Hx   . Bladder Cancer Neg Hx    Social History   Tobacco Use  . Smoking status: Never Smoker  . Smokeless tobacco: Never  Used  Substance Use Topics  . Alcohol use: No  . Drug use: No    Interim medical history since last visit reviewed. Allergies and medications reviewed  Review of Systems Per HPI unless specifically indicated above     Objective:    BP 138/64   Pulse 68   Temp 98.3 F (36.8 C) (Oral)   Resp 14   Ht 5\' 3"  (1.6 m)   Wt 124 lb 1.6 oz (56.3 kg)   SpO2 97%   BMI 21.98 kg/m   Wt Readings from Last 3 Encounters:  09/21/17 124 lb 1.6 oz (56.3 kg)  09/15/17 123 lb (55.8 kg)  09/03/17 120 lb 6.4 oz (54.6 kg)    Physical Exam  Results for orders placed or performed in visit on 09/17/17  HM DIABETES EYE EXAM  Result Value Ref Range   HM Diabetic Eye Exam No Retinopathy No Retinopathy      Assessment & Plan:   Problem List Items Addressed This Visit    None       Follow up plan: No Follow-up on file.  An after-visit summary was printed and given to the patient at [place].  Please see the patient instructions which may contain other information and recommendations beyond what is mentioned above in the assessment and  plan.  No orders of the defined types were placed in this encounter.   No orders of the defined types were placed in this encounter.

## 2017-09-21 NOTE — Assessment & Plan Note (Signed)
Foot exam today. 

## 2017-12-09 ENCOUNTER — Ambulatory Visit: Payer: Medicare Other | Admitting: Family Medicine

## 2017-12-10 ENCOUNTER — Ambulatory Visit: Payer: Medicare Other | Admitting: Family Medicine

## 2017-12-10 ENCOUNTER — Encounter: Payer: Self-pay | Admitting: Family Medicine

## 2017-12-10 DIAGNOSIS — E782 Mixed hyperlipidemia: Secondary | ICD-10-CM

## 2017-12-10 DIAGNOSIS — I10 Essential (primary) hypertension: Secondary | ICD-10-CM

## 2017-12-10 DIAGNOSIS — C49A0 Gastrointestinal stromal tumor, unspecified site: Secondary | ICD-10-CM | POA: Diagnosis not present

## 2017-12-10 DIAGNOSIS — M5030 Other cervical disc degeneration, unspecified cervical region: Secondary | ICD-10-CM | POA: Diagnosis not present

## 2017-12-10 DIAGNOSIS — E119 Type 2 diabetes mellitus without complications: Secondary | ICD-10-CM | POA: Diagnosis not present

## 2017-12-10 NOTE — Telephone Encounter (Signed)
Closing out open note, from Sept 2018

## 2017-12-10 NOTE — Assessment & Plan Note (Signed)
Seeing Dr. Primus Bravo

## 2017-12-10 NOTE — Progress Notes (Signed)
BP 132/64 (BP Location: Right Arm, Patient Position: Sitting, Cuff Size: Normal)   Pulse 80   Temp 97.8 F (36.6 C) (Oral)   Resp 14   Wt 127 lb 3.2 oz (57.7 kg)   SpO2 95%   BMI 22.53 kg/m    Subjective:    Patient ID: Rachael Castro, female    DOB: Jun 21, 1931, 84 y.o.   MRN: [id number]  HPI: Rachael Castro is a 84 y.o. female  Chief Complaint  Patient presents with  . Follow-up    diabetes and cholestorol    HPI Patient is here for routine f/u  Type 2 diabetes; taking Janumet; taking gabapentin for numbness; last eye exam in Nov, UTD; on aspirin, no problems with bleeding; no chest pain Lab Results  Component Value Date   HGBA1C 6.2 (H) 06/08/2017  Her A1c had been 9.1 back in July 2017; all since then have been less than 6.5  Hypertension; well-controlled; on ARB  Was seeing Dr. Primus Bravo for her neck; he gave her three shots in her neck; wanting another shot; it is helping  GIST; no abd pain, no blood in the stools; just diarrhea at times; seeing oncologist; raw salad and slaw trigger that  High cholesterol; last SGPT was normal, 17 in October, reviewed CMP done by cancer center LDL had come down in August 2018 from 111 to 83; eating better than before, less fatty meats Lab Results  Component Value Date   CHOL 155 06/08/2017   CHOL 184 12/05/2016   CHOL 193 09/01/2016   Lab Results  Component Value Date   HDL 48 (L) 06/08/2017   HDL 51 12/05/2016   HDL 54 09/01/2016   Lab Results  Component Value Date   LDLCALC 83 06/08/2017   LDLCALC 111 (H) 12/05/2016   LDLCALC 117 (H) 09/01/2016   Lab Results  Component Value Date   TRIG 120 06/08/2017   TRIG 108 12/05/2016   TRIG 109 09/01/2016   Lab Results  Component Value Date   CHOLHDL 3.2 06/08/2017   CHOLHDL 3.6 12/05/2016   CHOLHDL 3.6 09/01/2016   No results found for: LDLDIRECT   Depression screen [facility] 2/9 09/21/2017 08/07/2017 04/07/2017 12/05/2016 09/16/2016  Decreased Interest 0 0 0 0 0    Down, Depressed, Hopeless 0 0 0 0 0  PHQ - 2 Score 0 0 0 0 0    Relevant past medical, surgical, family and social history reviewed Past Medical History:  Diagnosis Date  . Breast cancer ([place]) 1999   right breast  . Diabetes mellitus   . GERD (gastroesophageal reflux disease)   . History of shingles   . Hypercholesteremia   . Hypertension   . Kidney stones   . 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 Personal history of chemotherapy   . Personal history of colonic polyps   . Thyroid disease    Past Surgical History:  Procedure Laterality Date  . ABDOMINAL HYSTERECTOMY    . APPENDECTOMY    . BREAST BIOPSY Left 09/08/2016   Done by Dr. Jamal Collin  . BREAST SURGERY Right 1999   mastectomy  . CATARACT EXTRACTION W/PHACO Left 04/30/2015   Procedure: CATARACT EXTRACTION PHACO AND INTRAOCULAR LENS PLACEMENT (IOC);  Surgeon: Estill Cotta, MD;  Location: [facility];  Service: Ophthalmology;  Laterality: Left;  [place] 01:39 AP% 23.3 CDE 40.42 fluid pack lot # [id number]  . COLONOSCOPY  2008   Sankar  . EUS  02/12/2012  Procedure: UPPER ENDOSCOPIC ULTRASOUND (EUS) LINEAR;  Surgeon: Milus Banister, MD;  Location: [facility];  Service: Endoscopy;  Laterality: N/A;  radial linear  . EYE SURGERY Right 2013   cataract  . HERNIA REPAIR  2012  . LAPAROTOMY  2013   excision of gastric wall mass   . MASTECTOMY    . SALPINGOOPHORECTOMY Right 1979  . THYROIDECTOMY  1978  . TONSILLECTOMY    . TUBAL LIGATION    . UPPER GI ENDOSCOPY  2013   Family History  Problem Relation Age of Onset  . Cancer Mother        cervical  . Cancer Father        prostate  . Kidney Stones Brother   . Glaucoma Brother   . Prostatitis Brother   . Diabetes Sister   . Thyroid disease Brother   . Cancer Brother        thyroid  . Arthritis Brother   . Diabetes Sister   . Kidney disease Neg Hx   . Bladder Cancer Neg Hx    Social History   Tobacco Use  . Smoking status:  Never Smoker  . Smokeless tobacco: Never Used  Substance Use Topics  . Alcohol use: No  . Drug use: No    Interim medical history since last visit reviewed. Allergies and medications reviewed  Review of Systems Per HPI unless specifically indicated above     Objective:    BP 132/64 (BP Location: Right Arm, Patient Position: Sitting, Cuff Size: Normal)   Pulse 80   Temp 97.8 F (36.6 C) (Oral)   Resp 14   Wt 127 lb 3.2 oz (57.7 kg)   SpO2 95%   BMI 22.53 kg/m   Wt Readings from Last 3 Encounters:  12/10/17 127 lb 3.2 oz (57.7 kg)  09/21/17 124 lb 1.6 oz (56.3 kg)  09/15/17 123 lb (55.8 kg)    Physical Exam  Constitutional: She appears well-developed and well-nourished. No distress.  She appears a decade younger than her stated age  HENT:  Head: Normocephalic and atraumatic.  Eyes: No scleral icterus.  Neck: No thyromegaly present.  Cardiovascular: Normal rate, regular rhythm and normal heart sounds.  No murmur heard. Pulmonary/Chest: Effort normal and breath sounds normal.  Abdominal: Soft. She exhibits no distension.  Musculoskeletal: Normal range of motion. She exhibits no edema.  Neurological: She is alert. She exhibits normal muscle tone.  Skin: Skin is warm and dry. She is not diaphoretic. No pallor.  Psychiatric: She has a normal mood and affect. Her behavior is normal. Judgment and thought content normal.   Diabetic Foot Form - Detailed   Diabetic Foot Exam - detailed Diabetic Foot exam was performed with the following findings:  Yes 12/10/2017 10:26 AM  Visual Foot Exam completed.:  Yes  Pulse Foot Exam completed.:  Yes  Right Dorsalis Pedis:  Present Left Dorsalis Pedis:  Present  Sensory Foot Exam Completed.:  Yes Semmes-Weinstein Monofilament Test R Site 1-Great Toe:  Pos L Site 1-Great Toe:  Pos        Results for orders placed or performed in visit on 09/17/17  HM DIABETES EYE EXAM  Result Value Ref Range   HM Diabetic Eye Exam No Retinopathy  No Retinopathy      Assessment & Plan:   Problem List Items Addressed This Visit      Cardiovascular and Mediastinum   Hypertension    Well-controlled        Digestive  Gastrointestinal stromal tumor (GIST) [facility])    Seeing oncologist every 6 months        Endocrine   Type 2 diabetes mellitus (HCC)    Check A1c today; check FSBS only when desired; eye exam UTD; foot exam by MD today      Relevant Orders   Lipid panel   Hemoglobin A1c     Musculoskeletal and Integument   DDD (degenerative disc disease), cervical    Seeing Dr. Primus Bravo        Other   Hyperlipidemia    Check lipids; limit saturated fats      Relevant Orders   Lipid panel       Follow up plan: Return in about 6 months (around 06/09/2018) for follow-up visit with Dr. Sanda Klein.  An after-visit summary was printed and given to the patient at [place].  Please see the patient instructions which may contain other information and recommendations beyond what is mentioned above in the assessment and plan.  No orders of the defined types were placed in this encounter.   Orders Placed This Encounter  Procedures  . Lipid panel  . Hemoglobin A1c

## 2017-12-10 NOTE — Assessment & Plan Note (Signed)
Seeing oncologist every 6 months

## 2017-12-10 NOTE — Progress Notes (Signed)
Closing out lab/order note open since:  2018 

## 2017-12-10 NOTE — Assessment & Plan Note (Signed)
Well controlled 

## 2017-12-10 NOTE — Progress Notes (Signed)
Signing off on abstract from Nov 2018

## 2017-12-10 NOTE — Assessment & Plan Note (Signed)
Check lipids; limit saturated fats 

## 2017-12-10 NOTE — Assessment & Plan Note (Signed)
Check A1c today; check FSBS only when desired; eye exam UTD; foot exam by MD today

## 2017-12-11 LAB — LIPID PANEL
CHOLESTEROL: 185 mg/dL (ref <200)
HDL: 57 mg/dL (ref >50)
LDL Cholesterol (Calc): 110 mg/dL (calc) — ABNORMAL HIGH
Non-HDL Cholesterol (Calc): 128 mg/dL (calc) (ref <130)
Total CHOL/HDL Ratio: 3.2 (calc) (ref <5.0)
Triglycerides: 88 mg/dL (ref <150)

## 2017-12-11 LAB — HEMOGLOBIN A1C
EAG (MMOL/L): 7.6 (calc)
Hgb A1c MFr Bld: 6.4 % of total Hgb — ABNORMAL HIGH (ref <5.7)
MEAN PLASMA GLUCOSE: 137 (calc)

## 2017-12-18 ENCOUNTER — Other Ambulatory Visit: Payer: Self-pay | Admitting: Family Medicine

## 2017-12-18 MED ORDER — ATORVASTATIN CALCIUM 40 MG PO TABS: 60.0000 mg | ORAL_TABLET | Freq: Every day | ORAL | 1 refills | Status: DC

## 2017-12-18 NOTE — Progress Notes (Signed)
Increase statin

## 2017-12-24 ENCOUNTER — Encounter: Payer: Self-pay | Admitting: Family Medicine

## 2017-12-24 ENCOUNTER — Ambulatory Visit
Admission: RE | Admit: 2017-12-24 | Discharge: 2017-12-24 | Disposition: A | Discharge status: Home / Self Care | Payer: Medicare Other | Source: Ambulatory Visit | Attending: Family Medicine | Admitting: Family Medicine

## 2017-12-24 DIAGNOSIS — Z13820 Encounter for screening for osteoporosis: Secondary | ICD-10-CM | POA: Insufficient documentation

## 2017-12-24 DIAGNOSIS — M8588 Other specified disorders of bone density and structure, other site: Secondary | ICD-10-CM | POA: Diagnosis not present

## 2017-12-24 DIAGNOSIS — M8580 Other specified disorders of bone density and structure, unspecified site: Secondary | ICD-10-CM | POA: Insufficient documentation

## 2017-12-24 HISTORY — DX: Other specified disorders of bone density and structure, unspecified site: M85.80

## 2018-02-02 NOTE — Progress Notes (Signed)
[facility] day:  02/03/18  Chief Complaint: Rachael Castro is a 84 y.o. female with a history of right breast cancer (1999) and gastrointestinal stromal tumor (2013) who is seen for 6 month assessment.  HPI: The patient was last seen in the medical oncology clinic on 08/05/2017.  At that time, patient complained of fatigue associated with ambulation.  She denies exertional shortness of breath.  Patient with known cervical neuralgia that caused tingling in her RIGHT lower extremity.  Patient was on gabapentin BID she denied breast concerns. Exam was stable. CA27.29 was elevated at 39.2.  Patient underwent annual surveillance imaging of her chest, abdomen, and pelvis on 08/25/2017 for her GIST.  Study demonstrated further enlargement of the LEFT axillary lymph node.  Of note, this area was previously biopsied under ultrasound guidance on 08/2016 and found to be  benign lymphoid hyperplasia.  Axillary node measured 12 mm (previously 10 mm).  There was a stable 2 mm lung nodule in the RIGHT middle lobe.  Additionally, there was mention of a 5 mm subcutaneous nodule laterally in the RIGHT chest wall that was also stable.  Unilateral LEFT screening mammogram done on 09/04/2017 that revealed no mammographic evidence of malignancy.  Bone density on 12/24/2017 revealed a T score of -1.8 in the left femoral neck.  Results consistent with known osteopenia.  In the interim, patient is having pain in her neck. She is seeing a pain clinic in [place] for cervical neuralgia and RIGHT lower extremity "tingling". She continues on her Gabapentin BID. Patient has exertional dyspnea, however she notes that this has improved overall.   Patient denies breast concerns. She denies B symptoms and interval infections. Patient performs monthly self breast examinations as recommended. Patient is eating well. She has gained 3 pounds.   Patient denies pain in the clinic  today.   Past Medical History:  Diagnosis Date  . Breast cancer ([place]) 1999   right breast  . Diabetes mellitus   . GERD (gastroesophageal reflux disease)   . History of shingles   . Hypercholesteremia   . Hypertension   . Kidney stones   . 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 Osteopenia 12/24/2017   March 2019; next scan March 2021  . Personal history of chemotherapy   . Personal history of colonic polyps   . Thyroid disease     Past Surgical History:  Procedure Laterality Date  . ABDOMINAL HYSTERECTOMY    . APPENDECTOMY    . BREAST BIOPSY Left 09/08/2016   Done by Dr. Jamal Collin  . BREAST SURGERY Right 1999   mastectomy  . CATARACT EXTRACTION W/PHACO Left 04/30/2015   Procedure: CATARACT EXTRACTION PHACO AND INTRAOCULAR LENS PLACEMENT (IOC);  Surgeon: Estill Cotta, MD;  Location: [facility] ORS;  Service: Ophthalmology;  Laterality: Left;  [place] 01:39 AP% 23.3 CDE 40.42 fluid pack lot # [id number]  . COLONOSCOPY  2008   Sankar  . EUS  02/12/2012   Procedure: UPPER ENDOSCOPIC ULTRASOUND (EUS) LINEAR;  Surgeon: Milus Banister, MD;  Location: [facility];  Service: Endoscopy;  Laterality: N/A;  radial linear  . EYE SURGERY Right 2013   cataract  . HERNIA REPAIR  2012  . LAPAROTOMY  2013   excision of gastric wall mass   . MASTECTOMY    . SALPINGOOPHORECTOMY Right 1979  . THYROIDECTOMY  1978  . TONSILLECTOMY    . TUBAL LIGATION    .  UPPER GI ENDOSCOPY  2013    Family History  Problem Relation Age of Onset  . Cancer Mother        cervical  . Cancer Father        prostate  . Kidney Stones Brother   . Glaucoma Brother   . Prostatitis Brother   . Diabetes Sister   . Thyroid disease Brother   . Cancer Brother        thyroid  . Arthritis Brother   . Diabetes Sister   . Kidney disease Neg Hx   . Bladder Cancer Neg Hx     Social History:  reports that she has never smoked. She has never used smokeless tobacco. She reports that she does  not drink alcohol or use drugs.  She lives in [place].  The patient is alone today.  Allergies: No Known Allergies  Current Medications: Current Outpatient Medications  Medication Sig Dispense Refill  . aspirin 81 MG tablet Take 81 mg by mouth daily.    Marland Kitchen atorvastatin (LIPITOR) 40 MG tablet Take 1.5 tablets (60 mg total) by mouth at bedtime. 135 tablet 1  . bisacodyl (DULCOLAX) 5 MG EC tablet Take 5 mg daily as needed by mouth for moderate constipation.    . calcium citrate-vitamin D 200-200 MG-UNIT TABS Take 1 tablet by mouth daily.    Marland Kitchen gabapentin (NEURONTIN) 300 MG capsule Take 300 mg by mouth as needed.     Marland Kitchen losartan (COZAAR) 25 MG tablet TAKE 1 TABLET (25 MG TOTAL) BY MOUTH DAILY. 30 tablet 6  . Multiple Vitamin (MULTIVITAMIN) tablet Take 1 tablet by mouth daily.    Marland Kitchen omeprazole (PRILOSEC) 20 MG capsule TAKE 1 CAPSULE BY MOUTH EVERY DAY 30 capsule 6  . sitaGLIPtin-metformin (JANUMET) 50-500 MG tablet Take 1 tablet by mouth 2 (two) times daily with a meal. 180 tablet 3  . thiamine (VITAMIN B-1) 100 MG tablet Take 100 mg by mouth daily.     No current facility-administered medications for this visit.     Review of Systems:  GENERAL:  Feels good. "Can't complain".  No fevers, sweats or weight loss.  Weight up 10 pounds in 6 months. PERFORMANCE STATUS (ECOG):  1 HEENT:  No visual changes, runny nose, sore throat, mouth sores or tenderness. Lungs: Shortness of breath when walking distances.  No cough.  No hemoptysis. Cardiac:  No chest pain, palpitations, orthopnea, or PND. GI:  No nausea, vomiting, diarrhea, constipation, melena or hematochezia. GU:  No urgency, frequency, dysuria, or hematuria. Musculoskeletal:  Neck pain.  No back pain.  No joint pain.  No muscle tenderness. Extremities:  No pain or swelling. Skin:  No rashes or skin changes. Neuro:  Cervical neuralgia.  No headache, numbness or weakness, balance or coordination issues. Endocrine:  No diabetes, thyroid issues,  hot flashes or night sweats. Psych:  No mood changes, depression or anxiety. Pain:  No focal pain. Review of systems:  All other systems reviewed and found to be negative.   Physical Exam: Blood pressure 131/73, pulse 69, temperature (!) 97.4 F (36.3 C), resp. rate 18, weight 130 lb 1.1 oz (59 kg). GENERAL:  Thin woman sitting comfortably in the exam room in no acute distress. MENTAL STATUS:  Alert and oriented to person, place and time. HEAD:  Wearing a cap.  Curly gray hair.  Normocephalic, atraumatic, face symmetric, no Cushingoid features. EYES:  Glasses.  Brown eyes s/p cataract removal.  Pupils equal round and reactive to light and accomodation.  No conjunctivitis or scleral icterus. ENT:  Oropharynx clear without lesion.  Tongue normal. Mucous membranes moist.  RESPIRATORY:  Clear to auscultation without rales, wheezes or rhonchi. CARDIOVASCULAR:  Regular rate and rhythm without murmur, rub or gallop. BREAST:  Right mastectomy.  No erythema or nodularity.  Left breast without masses, skin changes or nipple discharge. Inverted nipple. ABDOMEN:  Soft, non-tender, with active bowel sounds, and no hepatosplenomegaly.  No masses. SKIN:  No rashes, ulcers or lesions. EXTREMITIES: No edema, no skin discoloration or tenderness.  No palpable cords. LYMPH NODES: No palpable cervical, supraclavicular, axillary or inguinal adenopathy  NEUROLOGICAL: Unremarkable. PSYCH:  Appropriate.    Appointment on 02/03/2018  Component Date Value Ref Range Status  . Sodium 02/03/2018 140  135 - 145 mmol/L Final  . Potassium 02/03/2018 4.1  3.5 - 5.1 mmol/L Final  . Chloride 02/03/2018 103  101 - 111 mmol/L Final  . CO2 02/03/2018 27  22 - 32 mmol/L Final  . Glucose, Bld 02/03/2018 135* 65 - 99 mg/dL Final  . BUN 02/03/2018 14  6 - 20 mg/dL Final  . Creatinine, Ser 02/03/2018 0.70  0.44 - 1.00 mg/dL Final  . Calcium 02/03/2018 9.3  8.9 - 10.3 mg/dL Final  . Total Protein 02/03/2018 7.5  6.5 - 8.1 g/dL  Final  . Albumin 02/03/2018 4.0  3.5 - 5.0 g/dL Final  . AST 02/03/2018 19  15 - 41 U/L Final  . ALT 02/03/2018 13* 14 - 54 U/L Final  . Alkaline Phosphatase 02/03/2018 57  38 - 126 U/L Final  . Total Bilirubin 02/03/2018 0.5  0.3 - 1.2 mg/dL Final  . GFR calc non Af Amer 02/03/2018 >60  >60 mL/min Final  . GFR calc Af Amer 02/03/2018 >60  >60 mL/min Final   Comment: (NOTE) The eGFR has been calculated using the CKD EPI equation. This calculation has not been validated in all clinical situations. eGFR's persistently <60 mL/min signify possible Chronic Kidney Disease.   Georgiann Hahn gap 02/03/2018 10  5 - 15 Final   Performed at [facility], [address] [place], [place] [address]  . WBC 02/03/2018 7.2  3.6 - 11.0 K/uL Final  . RBC 02/03/2018 3.83  3.80 - 5.20 MIL/uL Final  . Hemoglobin 02/03/2018 12.3  12.0 - 16.0 g/dL Final  . HCT 02/03/2018 36.2  35.0 - 47.0 % Final  . MCV 02/03/2018 94.7  80.0 - 100.0 fL Final  . MCH 02/03/2018 32.1  26.0 - 34.0 pg Final  . MCHC 02/03/2018 33.9  32.0 - 36.0 g/dL Final  . RDW 02/03/2018 13.8  11.5 - 14.5 % Final  . Platelets 02/03/2018 241  150 - 440 K/uL Final  . Neutrophils Relative % 02/03/2018 69  % Final  . Neutro Abs 02/03/2018 5.0  1.4 - 6.5 K/uL Final  . Lymphocytes Relative 02/03/2018 24  % Final  . Lymphs Abs 02/03/2018 1.7  1.0 - 3.6 K/uL Final  . Monocytes Relative 02/03/2018 5  % Final  . Monocytes Absolute 02/03/2018 0.4  0.2 - 0.9 K/uL Final  . Eosinophils Relative 02/03/2018 1  % Final  . Eosinophils Absolute 02/03/2018 0.1  0 - 0.7 K/uL Final  . Basophils Relative 02/03/2018 1  % Final  . Basophils Absolute 02/03/2018 0.0  0 - 0.1 K/uL Final   Performed at [facility], [address], [place], [place] [address]    Assessment:  Rachael Castro is a 84 y.o. female with a  history of right breast cancer status post modified radical mastectomy in 1999 followed by 4 cycles of Adriamycin and Cytoxan. She  received tamoxifen then extended adjuvant therapy with Femara.    Left sided mammogram on 07/19/2015 revealed no evidence of malignancy.  Left sided mammogram and ultrasound on 09/02/2016 revealed a borderline lymph node in the left axilla, partially behind the lateral edge of the pectoralis muscle.  Left axillary core needle biopsy on 09/08/2016 revealed benign lymphoid hyperplasia.  Left screening mammogram on 09/04/2017 that revealed no mammographic evidence of malignancy.   CA27.29 was 46.6 on 05/30/2015, 41.9 on 07/09/2015, 42.9 on 11/05/2015, 46.2 on 12/05/2015, 53.5 on 06/04/2016, 50.0 on 08/06/2016, 43.7 on 02/04/2017, and 39.2 on 08/05/2017.  She has a history of gastrointestinal stromal tumor (GIST) status post resection on 02/27/2012. Pathology revealed a 6.4 cm tumor. She received a year of [place] beginning 03/2012. Her dose was reduced secondary to side effects.  Abdominal and pelvic CT scan on 04/17/2015 revealed postsurgical changes related to the prior gastric resection. There was no evidence of recurrent or metastatic disease. There were multiple nonobstructing bilateral renal calculi measuring up to 2 mm.  Abdominal and pelvic CT scan on 12/03/2015 revealed a stable exam with no evidence of recurrent disease.  There were tiny nonobstructive intrarenal calculi without evidence of hydronephrosis.  Chest, abdomen, and pelvic CT scan with contrast on 06/17/2016 revealed a new solitary mildly enlarged 1.0 cm left axillary lymph node, nonspecific, nodal metastasis not excluded.  There were no additional potential findings of metastatic disease in the chest, abdomen or pelvis.  There was no evidence of local tumor recurrence in the stomach.   CT chest, abdomen, and pelvis on 08/25/2017 demonstrated further enlargement of the LEFT axillary lymph node.  Of note, this area was previously biopsied under ultrasound guidance on 08/2016 and found to be  benign lymphoid hyperplasia.  Axillary node  measured 12 mm (previously 10 mm). There was a stable 2 mm lung nodule in the RIGHT middle lobe.  There was a 5 mm subcutaneous nodule laterally in the RIGHT chest wall (stable).  Bone density on 12/24/2017 revealed osteopenia with a T score of -1.8 in the left femoral neck.  Symptomatically, she denies any breast or abdominal symptoms. She has chronic cervical neuralgia and tingling in her RIGHT lower extremity. She is on Gabapentin BID and sees pain management in [place].  Exam is stable. Labs  Are unremarkable.   Plan: 1.  Labs today:  CBC with diff, CMP, CA27.29. 2.  Review interval imaging for follow-up of GIST. 12 mm LEFT axillary node, 2 mm RML lung nodule, and 5 mm subcutaneous nodule to RIGHT chest wall.  3.  Discuss annual surveillance non-contrast CT of the chest, abdomen and pelvis due on 08/25/2018. 4.  Discuss follow-up with Dr. Bary Castilla as already scheduled. Surgery orders routine mammogram. 5.  Review bone density. T-score -1.8. Ten-year fracture probability by FRAX of 3% or greater for hip fracture or 20% or greater for major osteoporotic fracture. Continue daily calcium and vitamin D supplementation.  6.  RTC in 6 months (after CT) for MD assessment, labs (CBC with diff, CMP, CA27.29), and review of interval imaging.   Honor Loh, NP  02/03/2018, 11:05 AM   I saw and evaluated the patient, participating in the key portions of the service and reviewing pertinent diagnostic studies and records.  I reviewed the nurse practitioner's note and agree with the findings and the plan.  The assessment and plan were  discussed with the patient.  Additional diagnostic studies of CT scans are needed to follow-up on GIST and would change the clinical management.  Several questions were asked by the patient and answered.   Nolon Stalls, MD 02/03/18, 11:05 AM

## 2018-02-03 ENCOUNTER — Encounter: Payer: Self-pay | Admitting: Hematology and Oncology

## 2018-02-03 ENCOUNTER — Inpatient Hospital Stay: Payer: Medicare Other

## 2018-02-03 ENCOUNTER — Inpatient Hospital Stay: Payer: Medicare Other | Attending: Hematology and Oncology | Admitting: Hematology and Oncology

## 2018-02-03 VITALS — BP 131/73 | HR 69 | Temp 97.4°F | Resp 18 | Wt 130.1 lb

## 2018-02-03 DIAGNOSIS — K219 Gastro-esophageal reflux disease without esophagitis: Secondary | ICD-10-CM | POA: Insufficient documentation

## 2018-02-03 DIAGNOSIS — E7800 Pure hypercholesterolemia, unspecified: Secondary | ICD-10-CM | POA: Diagnosis not present

## 2018-02-03 DIAGNOSIS — Z923 Personal history of irradiation: Secondary | ICD-10-CM | POA: Diagnosis not present

## 2018-02-03 DIAGNOSIS — M85852 Other specified disorders of bone density and structure, left thigh: Secondary | ICD-10-CM

## 2018-02-03 DIAGNOSIS — I10 Essential (primary) hypertension: Secondary | ICD-10-CM | POA: Insufficient documentation

## 2018-02-03 DIAGNOSIS — E119 Type 2 diabetes mellitus without complications: Secondary | ICD-10-CM | POA: Diagnosis not present

## 2018-02-03 DIAGNOSIS — Z79899 Other long term (current) drug therapy: Secondary | ICD-10-CM | POA: Insufficient documentation

## 2018-02-03 DIAGNOSIS — Z9221 Personal history of antineoplastic chemotherapy: Secondary | ICD-10-CM

## 2018-02-03 DIAGNOSIS — C49A0 Gastrointestinal stromal tumor, unspecified site: Secondary | ICD-10-CM

## 2018-02-03 DIAGNOSIS — Z170 Estrogen receptor positive status [ER+]: Secondary | ICD-10-CM | POA: Insufficient documentation

## 2018-02-03 DIAGNOSIS — Z853 Personal history of malignant neoplasm of breast: Secondary | ICD-10-CM

## 2018-02-03 DIAGNOSIS — Z86018 Personal history of other benign neoplasm: Secondary | ICD-10-CM | POA: Diagnosis not present

## 2018-02-03 DIAGNOSIS — Z7189 Other specified counseling: Secondary | ICD-10-CM

## 2018-02-03 DIAGNOSIS — Z9011 Acquired absence of right breast and nipple: Secondary | ICD-10-CM | POA: Insufficient documentation

## 2018-02-03 DIAGNOSIS — Z7982 Long term (current) use of aspirin: Secondary | ICD-10-CM | POA: Insufficient documentation

## 2018-02-03 LAB — COMPREHENSIVE METABOLIC PANEL
ALT: 13 U/L — ABNORMAL LOW (ref 14–54)
AST: 19 U/L (ref 15–41)
Albumin: 4 g/dL (ref 3.5–5.0)
Alkaline Phosphatase: 57 U/L (ref 38–126)
Anion gap: 10 (ref 5–15)
BUN: 14 mg/dL (ref 6–20)
CALCIUM: 9.3 mg/dL (ref 8.9–10.3)
CHLORIDE: 103 mmol/L (ref 101–111)
CO2: 27 mmol/L (ref 22–32)
CREATININE: 0.7 mg/dL (ref 0.44–1.00)
Glucose, Bld: 135 mg/dL — ABNORMAL HIGH (ref 65–99)
Potassium: 4.1 mmol/L (ref 3.5–5.1)
Sodium: 140 mmol/L (ref 135–145)
Total Bilirubin: 0.5 mg/dL (ref 0.3–1.2)
Total Protein: 7.5 g/dL (ref 6.5–8.1)

## 2018-02-03 LAB — CBC WITH DIFFERENTIAL/PLATELET
BASOS PCT: 1 %
Basophils Absolute: 0 10*3/uL (ref 0–0.1)
EOS ABS: 0.1 10*3/uL (ref 0–0.7)
EOS PCT: 1 %
HCT: 36.2 % (ref 35.0–47.0)
Hemoglobin: 12.3 g/dL (ref 12.0–16.0)
LYMPHS ABS: 1.7 10*3/uL (ref 1.0–3.6)
Lymphocytes Relative: 24 %
MCH: 32.1 pg (ref 26.0–34.0)
MCHC: 33.9 g/dL (ref 32.0–36.0)
MCV: 94.7 fL (ref 80.0–100.0)
MONOS PCT: 5 %
Monocytes Absolute: 0.4 10*3/uL (ref 0.2–0.9)
Neutro Abs: 5 10*3/uL (ref 1.4–6.5)
Neutrophils Relative %: 69 %
PLATELETS: 241 10*3/uL (ref 150–440)
RBC: 3.83 MIL/uL (ref 3.80–5.20)
RDW: 13.8 % (ref 11.5–14.5)
WBC: 7.2 10*3/uL (ref 3.6–11.0)

## 2018-02-03 NOTE — Progress Notes (Signed)
Patient offers no complaints today. 

## 2018-02-04 LAB — CANCER ANTIGEN 27.29: CA 27.29: 44.6 U/mL — ABNORMAL HIGH (ref 0.0–38.6)

## 2018-02-20 ENCOUNTER — Other Ambulatory Visit: Payer: Self-pay | Admitting: Family Medicine

## 2018-04-13 ENCOUNTER — Other Ambulatory Visit: Payer: Self-pay | Admitting: Family Medicine

## 2018-04-14 NOTE — Telephone Encounter (Signed)
Last Cr and K+ reviewed; Rx approved 

## 2018-06-09 ENCOUNTER — Encounter: Payer: Self-pay | Admitting: Family Medicine

## 2018-06-09 ENCOUNTER — Ambulatory Visit: Payer: Medicare Other | Admitting: Family Medicine

## 2018-06-09 VITALS — BP 134/76 | HR 74 | Temp 98.3°F | Ht 63.0 in | Wt 124.2 lb

## 2018-06-09 DIAGNOSIS — I10 Essential (primary) hypertension: Secondary | ICD-10-CM

## 2018-06-09 DIAGNOSIS — Z1211 Encounter for screening for malignant neoplasm of colon: Secondary | ICD-10-CM

## 2018-06-09 DIAGNOSIS — E119 Type 2 diabetes mellitus without complications: Secondary | ICD-10-CM | POA: Diagnosis not present

## 2018-06-09 DIAGNOSIS — C49A0 Gastrointestinal stromal tumor, unspecified site: Secondary | ICD-10-CM | POA: Diagnosis not present

## 2018-06-09 DIAGNOSIS — M85852 Other specified disorders of bone density and structure, left thigh: Secondary | ICD-10-CM

## 2018-06-09 DIAGNOSIS — Z5181 Encounter for therapeutic drug level monitoring: Secondary | ICD-10-CM

## 2018-06-09 DIAGNOSIS — E782 Mixed hyperlipidemia: Secondary | ICD-10-CM

## 2018-06-09 DIAGNOSIS — Z8509 Personal history of malignant neoplasm of other digestive organs: Secondary | ICD-10-CM

## 2018-06-09 DIAGNOSIS — M5136 Other intervertebral disc degeneration, lumbar region: Secondary | ICD-10-CM

## 2018-06-09 MED ORDER — TRIAMCINOLONE ACETONIDE 0.1 % EX CREA: 1.0000 "application " | TOPICAL_CREAM | Freq: Two times a day (BID) | CUTANEOUS | 0 refills | Status: DC

## 2018-06-09 NOTE — Assessment & Plan Note (Signed)
Released from care by Dr. Primus Bravo and can return if needed

## 2018-06-09 NOTE — Assessment & Plan Note (Signed)
Managed by oncologist; scans coming up in November

## 2018-06-09 NOTE — Patient Instructions (Signed)
Please do see your eye doctor regularly, and have your eyes examined every year (or more often per his or her recommendation) Check your feet every night and let me know right away of any sores, infections, numbness, etc. Try to limit sweets, white bread, white rice, white potatoes It is okay with me for you to not check your fingerstick blood sugars (per [employer] of Endocrinology Best Practices), unless you are interested and feel it would be helpful for you

## 2018-06-09 NOTE — Assessment & Plan Note (Signed)
Well controlled today.

## 2018-06-09 NOTE — Assessment & Plan Note (Signed)
Check A1c today; foot exam by MD; limit sugary drinks; eye exams yearly

## 2018-06-09 NOTE — Assessment & Plan Note (Signed)
Check liver and kidneys 

## 2018-06-09 NOTE — Assessment & Plan Note (Signed)
Check lipids; limit saturated fats 

## 2018-06-09 NOTE — Assessment & Plan Note (Signed)
UTD; fall precautions; calcium and vit D

## 2018-06-09 NOTE — Progress Notes (Signed)
BP 134/76   Pulse 74   Temp 98.3 F (36.8 C)   Ht 5\' 3"  (1.6 m)   Wt 124 lb 3.2 oz (56.3 kg)   SpO2 95%   BMI 22.00 kg/m    Subjective:    Patient ID: Rachael Castro, female    DOB: 04/25/1931, 84 y.o.   MRN: [id number]  HPI: Rachael Castro is a 84 y.o. female  Chief Complaint  Patient presents with  . Follow-up  . Foot Pain    Bilateral, right is worse, onset 1 month ago. Burning, tingling, itching and some numbness.     HPI Patient is here for f/u  Type 2 diabetes mellitus; does drink occasional diet soda or kool aid; no sweetened tea; rare sweet; no blurred vision or dry mouth; eye exam due; patient says the eye doctor usually calls her, but she will call her; no new problems with feet  Lab Results  Component Value Date   HGBA1C 6.4 (H) 12/10/2017   Bilateral leg itching and tingling and numbness; started with the right leg; Dr. Primus Bravo gave her shots for her; then the left leg started itching and she had been out in the grass; used some cortisone and that helped the left leg; right leg started to itch; does a lot of gardening, does not think it is that, nothing she's around new  Gastrointestinal stromal tumor; occasional diarrhea; due for chest, abd, pelvis CT in November; ordered by Dr. Mike Gip; she has labs ordered through oncologist, CBC, CMP, and Ca27.29 due in October  High cholesterol; does not eat sausage; occasional bacon now and then; tries to limit processed meats like bologna; on 60 mg atorvastatin Lab Results  Component Value Date   CHOL 185 12/10/2017   HDL 57 12/10/2017   LDLCALC 110 (H) 12/10/2017   TRIG 88 12/10/2017   CHOLHDL 3.2 12/10/2017   HM list reviewed; colonoscopy coming due in September; at her age, she still wants to have these done  Hx of shingles; has gabapentin for neuralgia  Osteopenia; fall precautions; getting greens; taking vitamin D  Aspirin therapy; no hx of heart attack or stroke; just primary prevention  Depression  screen [facility] 2/9 06/09/2018 09/21/2017 08/07/2017 04/07/2017 12/05/2016  Decreased Interest 0 0 0 0 0  Down, Depressed, Hopeless 0 0 0 0 0  PHQ - 2 Score 0 0 0 0 0    Relevant past medical, surgical, family and social history reviewed Past Medical History:  Diagnosis Date  . Breast cancer ([place]) 1999   right breast  . Diabetes mellitus   . GERD (gastroesophageal reflux disease)   . History of shingles   . Hypercholesteremia   . Hypertension   . Kidney stones   . 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 Osteopenia 12/24/2017   March 2019; next scan March 2021  . Personal history of chemotherapy   . Personal history of colonic polyps   . Thyroid disease    Past Surgical History:  Procedure Laterality Date  . ABDOMINAL HYSTERECTOMY    . APPENDECTOMY    . BREAST BIOPSY Left 09/08/2016   Done by Dr. Jamal Collin  . BREAST SURGERY Right 1999   mastectomy  . CATARACT EXTRACTION W/PHACO Left 04/30/2015   Procedure: CATARACT EXTRACTION PHACO AND INTRAOCULAR LENS PLACEMENT (IOC);  Surgeon: Estill Cotta, MD;  Location: [facility] ORS;  Service: Ophthalmology;  Laterality: Left;  [place] 01:39 AP% 23.3 CDE 40.42 fluid pack lot #  [id number]  . COLONOSCOPY  2008   Sankar  . EUS  02/12/2012   Procedure: UPPER ENDOSCOPIC ULTRASOUND (EUS) LINEAR;  Surgeon: Milus Banister, MD;  Location: [facility];  Service: Endoscopy;  Laterality: N/A;  radial linear  . EYE SURGERY Right 2013   cataract  . HERNIA REPAIR  2012  . LAPAROTOMY  2013   excision of gastric wall mass   . MASTECTOMY    . SALPINGOOPHORECTOMY Right 1979  . THYROIDECTOMY  1978  . TONSILLECTOMY    . TUBAL LIGATION    . UPPER GI ENDOSCOPY  2013   Family History  Problem Relation Age of Onset  . Cancer Mother        cervical  . Cancer Father        prostate  . Kidney Stones Brother   . Glaucoma Brother   . Prostatitis Brother   . Diabetes Sister   . Thyroid disease Brother   . Cancer Brother        thyroid    . Arthritis Brother   . Diabetes Sister   . Kidney disease Neg Hx   . Bladder Cancer Neg Hx    Social History   Tobacco Use  . Smoking status: Never Smoker  . Smokeless tobacco: Never Used  Substance Use Topics  . Alcohol use: No  . Drug use: No    Interim medical history since last visit reviewed. Allergies and medications reviewed  Review of Systems Per HPI unless specifically indicated above     Objective:    BP 134/76   Pulse 74   Temp 98.3 F (36.8 C)   Ht 5\' 3"  (1.6 m)   Wt 124 lb 3.2 oz (56.3 kg)   SpO2 95%   BMI 22.00 kg/m   Wt Readings from Last 3 Encounters:  06/09/18 124 lb 3.2 oz (56.3 kg)  02/03/18 130 lb 1.1 oz (59 kg)  12/10/17 127 lb 3.2 oz (57.7 kg)    Physical Exam  Constitutional: She appears well-developed and well-nourished. No distress.  HENT:  Head: Normocephalic and atraumatic.  Eyes: EOM are normal. No scleral icterus.  Neck: No thyromegaly present.  Cardiovascular: Normal rate, regular rhythm and normal heart sounds.  No murmur heard. Pulmonary/Chest: Effort normal and breath sounds normal. No respiratory distress. She has no wheezes.  Abdominal: Soft. Bowel sounds are normal. She exhibits no distension.  Musculoskeletal: She exhibits no edema.  Neurological: She is alert.  Skin: Skin is warm and dry. She is not diaphoretic. No pallor.  Psychiatric: She has a normal mood and affect. Her behavior is normal. Judgment and thought content normal.   Diabetic Foot Form - Detailed   Diabetic Foot Exam - detailed Diabetic Foot exam was performed with the following findings:  Yes 06/09/2018 12:22 PM  Visual Foot Exam completed.:  Yes  Pulse Foot Exam completed.:  Yes  Right Dorsalis Pedis:  Present Left Dorsalis Pedis:  Present  Sensory Foot Exam Completed.:  Yes Semmes-Weinstein Monofilament Test R Site 1-Great Toe:  Pos L Site 1-Great Toe:  Pos         Results for orders placed or performed in visit on 02/03/18  Cancer antigen  27.29  Result Value Ref Range   CA 27.29 44.6 (H) 0.0 - 38.6 U/mL  Comprehensive metabolic panel  Result Value Ref Range   Sodium 140 135 - 145 mmol/L   Potassium 4.1 3.5 - 5.1 mmol/L   Chloride 103 101 - 111 mmol/L  CO2 27 22 - 32 mmol/L   Glucose, Bld 135 (H) 65 - 99 mg/dL   BUN 14 6 - 20 mg/dL   Creatinine, Ser 0.70 0.44 - 1.00 mg/dL   Calcium 9.3 8.9 - 10.3 mg/dL   Total Protein 7.5 6.5 - 8.1 g/dL   Albumin 4.0 3.5 - 5.0 g/dL   AST 19 15 - 41 U/L   ALT 13 (L) 14 - 54 U/L   Alkaline Phosphatase 57 38 - 126 U/L   Total Bilirubin 0.5 0.3 - 1.2 mg/dL   GFR calc non Af Amer >60 >60 mL/min   GFR calc Af Amer >60 >60 mL/min   Anion gap 10 5 - 15  CBC with Differential  Result Value Ref Range   WBC 7.2 3.6 - 11.0 K/uL   RBC 3.83 3.80 - 5.20 MIL/uL   Hemoglobin 12.3 12.0 - 16.0 g/dL   HCT 36.2 35.0 - 47.0 %   MCV 94.7 80.0 - 100.0 fL   MCH 32.1 26.0 - 34.0 pg   MCHC 33.9 32.0 - 36.0 g/dL   RDW 13.8 11.5 - 14.5 %   Platelets 241 150 - 440 K/uL   Neutrophils Relative % 69 %   Neutro Abs 5.0 1.4 - 6.5 K/uL   Lymphocytes Relative 24 %   Lymphs Abs 1.7 1.0 - 3.6 K/uL   Monocytes Relative 5 %   Monocytes Absolute 0.4 0.2 - 0.9 K/uL   Eosinophils Relative 1 %   Eosinophils Absolute 0.1 0 - 0.7 K/uL   Basophils Relative 1 %   Basophils Absolute 0.0 0 - 0.1 K/uL      Assessment & Plan:   Problem List Items Addressed This Visit      Cardiovascular and Mediastinum   Hypertension    Well-controlled today        Digestive   Gastrointestinal stromal tumor (GIST) ([place])    Managed by oncologist; scans coming up in November      Relevant Orders   Ambulatory referral to Gastroenterology     Endocrine   Type 2 diabetes mellitus ([facility]) - Primary    Check A1c today; foot exam by MD; limit sugary drinks; eye exams yearly      Relevant Orders   Microalbumin / creatinine urine ratio   Lipid panel   Hemoglobin A1c     Musculoskeletal and Integument   Osteopenia    UTD;  fall precautions; calcium and vit D      DDD (degenerative disc disease), lumbar    Released from care by Dr. Primus Bravo and can return if needed        Other   H/O malignant gastrointestinal stromal tumor (GIST)   Relevant Orders   Ambulatory referral to Gastroenterology   Medication monitoring encounter    Check liver and kidneys      Hyperlipidemia    Check lipids; limit saturated fats      Relevant Orders   Lipid panel    Other Visit Diagnoses    Screen for colon cancer       Relevant Orders   Ambulatory referral to Gastroenterology       Follow up plan: Return in about 6 months (around 12/10/2018) for follow-up visit with Dr. Sanda Klein.  An after-visit summary was printed and given to the patient at [facility].  Please see the patient instructions which may contain other information and recommendations beyond what is mentioned above in the assessment and plan.  Meds ordered this encounter  Medications  .  triamcinolone cream (KENALOG) 0.1 %    Sig: Apply 1 application topically 2 (two) times daily. (for the legs)    Dispense:  30 g    Refill:  0    Orders Placed This Encounter  Procedures  . Microalbumin / creatinine urine ratio  . Lipid panel  . Hemoglobin A1c  . Ambulatory referral to Gastroenterology

## 2018-06-10 LAB — MICROALBUMIN / CREATININE URINE RATIO
Creatinine, Urine: 132 mg/dL (ref 20–275)
MICROALB UR: 1 mg/dL
MICROALB/CREAT RATIO: 8 ug/mg{creat} (ref <30)

## 2018-06-10 LAB — LIPID PANEL
Cholesterol: 173 mg/dL (ref <200)
HDL: 47 mg/dL — AB (ref >50)
LDL Cholesterol (Calc): 98 mg/dL (calc)
NON-HDL CHOLESTEROL (CALC): 126 mg/dL (ref <130)
TRIGLYCERIDES: 186 mg/dL — AB (ref <150)
Total CHOL/HDL Ratio: 3.7 (calc) (ref <5.0)

## 2018-06-10 LAB — HEMOGLOBIN A1C
HEMOGLOBIN A1C: 6.6 %{Hb} — AB (ref <5.7)
Mean Plasma Glucose: 143 (calc)
eAG (mmol/L): 7.9 (calc)

## 2018-06-11 ENCOUNTER — Other Ambulatory Visit: Payer: Self-pay | Admitting: Family Medicine

## 2018-06-11 MED ORDER — ATORVASTATIN CALCIUM 40 MG PO TABS: 60.0000 mg | ORAL_TABLET | Freq: Every day | ORAL | 1 refills | Status: DC

## 2018-06-11 NOTE — Progress Notes (Signed)
Refilled statin

## 2018-06-14 ENCOUNTER — Encounter: Payer: Self-pay | Admitting: Gastroenterology

## 2018-06-14 ENCOUNTER — Ambulatory Visit: Payer: Medicare Other | Admitting: Gastroenterology

## 2018-06-14 VITALS — BP 115/65 | HR 71 | Ht 63.0 in | Wt 126.2 lb

## 2018-06-14 DIAGNOSIS — C49A0 Gastrointestinal stromal tumor, unspecified site: Secondary | ICD-10-CM

## 2018-06-14 DIAGNOSIS — R110 Nausea: Secondary | ICD-10-CM | POA: Diagnosis not present

## 2018-06-19 NOTE — Progress Notes (Addendum)
Rachael Castro, [place] [address]  Main: [phone]  Fax: [phone]   Gastroenterology Consultation  Referring Provider:     Arnetha Courser, MD Primary Care Physician:  Arnetha Courser, MD Primary Gastroenterologist:  Dr. Vonda Antigua Reason for Consultation:     GIST        HPI:    Chief Complaint  Patient presents with  . New Patient (Initial Visit)    referred by Dr. Lisette Abu stromal tumor, h/o, c/o nausea when eating    Rachael Castro is a 84 y.o. y/o female referred for consultation & management  by Dr. Sanda Klein, Satira Anis, MD.    Patient with history of gist in 2013, status post resection and Gleevec therapy, under imaging surveillance by oncology.  Denies any abdominal pain, weight loss, heartburn, dysphagia, melena, hematochezia. Patient is reporting loose stools over the last 1-2 months. 1-2 loose BMs a day.   EGD in April 2013 by Dr. Minna Merritts with 3 cm gastric fundus mass noted.  She was then referred for an EUS which was also done in April 2013 which noted a 6 cm subepithelial gastric mass communicating with the muscularis propria layer of the gastric wall.  EUS FNA was done and sent for cytology.  No malignant cells identified.  Pathology was nondiagnostic.  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  She underwent laparotomy resection in May 2013, and pathology from the resection showed spindle cell neoplasm consistent with Gist.  As per her oncologist note, Dr. Mike Gip, she received a year of [place] beginning June 2013 and dose was reduced secondary to side effects.  She is under annual surveillance imaging of chest abdomen pelvis by Dr. Mike Gip.  She also has history of breast cancer in 1999.  As per Dr. Sonia Baller last note "Patient underwent annual surveillance imaging of her chest, abdomen, and pelvis on 08/25/2017 for her GIST.  Study demonstrated further enlargement of the LEFT axillary lymph  node.  Of note, this area was previously biopsied under ultrasound guidance on 08/2016 and found to be  benign lymphoid hyperplasia.  Axillary node measured 12 mm (previously 10 mm).  There was a stable 2 mm lung nodule in the RIGHT middle lobe.  Additionally, there was mention of a 5 mm subcutaneous nodule laterally in the RIGHT chest wall that was also stable."  Reports available in provation:  Last colonoscopy, 2014, by Dr. Jamal Collin done for history of polyps was normal. Colon Was reported to be tortuous.  Withdrawal time was less than 6 minutes.  EGD was done at the same time, and was reported to be normal.  2008 colonoscopy by Dr. Jamal Collin was normal  2006 colonoscopy by Dr. Nicolasa Ducking, showed a frond like villous non-obstructing small mass in the ascending colon.  It was noted to be non-circumferential, and oozing.  The report from this procedure is somewhat confusing, as in one area it states polypectomy was done with a hot snare.  But under the impression it states "Tumor. Removal was not done". Repeat was recommended in 1 year.   2001 colonoscopy by Dr. Alveta Heimlich. Very tortuous colon and internal hemorrhoids reported. Extent of exam cecum.    Past Medical History:  Diagnosis Date  . Breast cancer ([place]) 1999   right breast  . Diabetes mellitus   . GERD (gastroesophageal reflux disease)   . History of shingles   . Hypercholesteremia   . Hypertension   .  Kidney stones   . 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 Osteopenia 12/24/2017   March 2019; next scan March 2021  . Personal history of chemotherapy   . Personal history of colonic polyps   . Thyroid disease     Past Surgical History:  Procedure Laterality Date  . ABDOMINAL HYSTERECTOMY    . APPENDECTOMY    . BREAST BIOPSY Left 09/08/2016   Done by Dr. Jamal Collin  . BREAST SURGERY Right 1999   mastectomy  . CATARACT EXTRACTION W/PHACO Left 04/30/2015   Procedure: CATARACT EXTRACTION PHACO AND INTRAOCULAR LENS  PLACEMENT (IOC);  Surgeon: Estill Cotta, MD;  Location: [facility] ORS;  Service: Ophthalmology;  Laterality: Left;  [place] 01:39 AP% 23.3 CDE 40.42 fluid pack lot # [id number]  . COLONOSCOPY  2008   Sankar  . EUS  02/12/2012   Procedure: UPPER ENDOSCOPIC ULTRASOUND (EUS) LINEAR;  Surgeon: Milus Banister, MD;  Location: [facility];  Service: Endoscopy;  Laterality: N/A;  radial linear  . EYE SURGERY Right 2013   cataract  . HERNIA REPAIR  2012  . LAPAROTOMY  2013   excision of gastric wall mass   . MASTECTOMY    . SALPINGOOPHORECTOMY Right 1979  . THYROIDECTOMY  1978  . TONSILLECTOMY    . TUBAL LIGATION    . UPPER GI ENDOSCOPY  2013    Prior to Admission medications   Medication Sig Start Date End Date Taking? Authorizing Provider  atorvastatin (LIPITOR) 40 MG tablet Take 1.5 tablets (60 mg total) by mouth at bedtime. 06/11/18  Yes Lada, Satira Anis, MD  calcium citrate-vitamin D 200-200 MG-UNIT TABS Take 1 tablet by mouth daily.   Yes [provider]  gabapentin (NEURONTIN) 300 MG capsule Take 300 mg by mouth as needed.    Yes [provider]  losartan (COZAAR) 25 MG tablet TAKE 1 TABLET BY MOUTH EVERY DAY 04/14/18  Yes Lada, Satira Anis, MD  Multiple Vitamin (MULTIVITAMIN) tablet Take 1 tablet by mouth daily.   Yes [provider]  omeprazole (PRILOSEC) 20 MG capsule TAKE 1 CAPSULE BY MOUTH EVERY DAY 02/21/18  Yes Lada, Satira Anis, MD  sitaGLIPtin-metformin (JANUMET) 50-500 MG tablet Take 1 tablet by mouth 2 (two) times daily with a meal. 08/18/17  Yes Lada, Satira Anis, MD  thiamine (VITAMIN B-1) 100 MG tablet Take 100 mg by mouth daily.   Yes [provider]  triamcinolone cream (KENALOG) 0.1 % Apply 1 application topically 2 (two) times daily. (for the legs) 06/09/18  Yes Lada, Satira Anis, MD    Family History  Problem Relation Age of Onset  . Cancer Mother        cervical  . Cancer Father        prostate  . Kidney Stones Brother   . Glaucoma Brother     . Prostatitis Brother   . Diabetes Sister   . Thyroid disease Brother   . Cancer Brother        thyroid  . Arthritis Brother   . Diabetes Sister   . Kidney disease Neg Hx   . Bladder Cancer Neg Hx      Social History   Tobacco Use  . Smoking status: Never Smoker  . Smokeless tobacco: Never Used  Substance Use Topics  . Alcohol use: No  . Drug use: No    Allergies as of 06/14/2018  . (No Known Allergies)    Review of Systems:  All systems reviewed and negative except where noted in HPI.   Physical Exam:  BP 115/65   Pulse 71   Ht 5\' 3"  (1.6 m)   Wt 126 lb 3.2 oz (57.2 kg)   BMI 22.36 kg/m  No LMP recorded. Patient has had a hysterectomy. Psych:  Alert and cooperative. Normal mood and affect. General:   Alert,  Well-developed, well-nourished, pleasant and cooperative in NAD Head:  Normocephalic and atraumatic. Eyes:  Sclera clear, no icterus.   Conjunctiva pink. Ears:  Normal auditory acuity. Nose:  No deformity, discharge, or lesions. Mouth:  No deformity or lesions,oropharynx pink & moist. Neck:  Supple; no masses or thyromegaly. Lungs:  Respirations even and unlabored.  Clear throughout to auscultation.   No wheezes, crackles, or rhonchi. No acute distress. Heart:  Regular rate and rhythm; no murmurs, clicks, rubs, or gallops. Abdomen:  Normal bowel sounds.  No bruits.  Soft, non-tender and non-distended without masses, hepatosplenomegaly or hernias noted.  No guarding or rebound tenderness.    Msk:  Symmetrical without gross deformities. Good, equal movement & strength bilaterally. Pulses:  Normal pulses noted. Extremities:  No clubbing or edema.  No cyanosis. Neurologic:  Alert and oriented x3;  grossly normal neurologically. Skin:  Intact without significant lesions or rashes. No jaundice. Lymph Nodes:  No significant cervical adenopathy. Psych:  Alert and cooperative. Normal mood and affect.   Labs: CBC    Component Value Date/Time   WBC 7.2  02/03/2018 1001   RBC 3.83 02/03/2018 1001   HGB 12.3 02/03/2018 1001   HGB 12.3 02/14/2015 1103   HCT 36.2 02/03/2018 1001   HCT 36.0 02/14/2015 1103   PLT 241 02/03/2018 1001   PLT 201 02/14/2015 1103   MCV 94.7 02/03/2018 1001   MCV 94 02/14/2015 1103   MCH 32.1 02/03/2018 1001   MCHC 33.9 02/03/2018 1001   RDW 13.8 02/03/2018 1001   RDW 13.1 02/14/2015 1103   LYMPHSABS 1.7 02/03/2018 1001   LYMPHSABS 2.5 02/14/2015 1103   MONOABS 0.4 02/03/2018 1001   MONOABS 0.4 02/14/2015 1103   EOSABS 0.1 02/03/2018 1001   EOSABS 0.1 02/14/2015 1103   BASOSABS 0.0 02/03/2018 1001   BASOSABS 0.1 02/14/2015 1103   CMP     Component Value Date/Time   NA 140 02/03/2018 1001   NA 144 06/18/2015 1210   NA 137 02/14/2015 1103   K 4.1 02/03/2018 1001   K 4.0 02/14/2015 1103   CL 103 02/03/2018 1001   CL 101 02/14/2015 1103   CO2 27 02/03/2018 1001   CO2 27 02/14/2015 1103   GLUCOSE 135 (H) 02/03/2018 1001   GLUCOSE 141 (H) 02/14/2015 1103   BUN 14 02/03/2018 1001   BUN 16 06/18/2015 1210   BUN 17 02/14/2015 1103   CREATININE 0.70 02/03/2018 1001   CREATININE 0.64 06/08/2017 1032   CALCIUM 9.3 02/03/2018 1001   CALCIUM 9.3 02/14/2015 1103   PROT 7.5 02/03/2018 1001   PROT 6.9 06/18/2015 1210   PROT 7.2 02/14/2015 1103   ALBUMIN 4.0 02/03/2018 1001   ALBUMIN 4.5 06/18/2015 1210   ALBUMIN 4.0 02/14/2015 1103   AST 19 02/03/2018 1001   AST 23 02/14/2015 1103   ALT 13 (L) 02/03/2018 1001   ALT 14 02/14/2015 1103   ALKPHOS 57 02/03/2018 1001   ALKPHOS 81 02/14/2015 1103   BILITOT 0.5 02/03/2018 1001   BILITOT 0.3 06/18/2015 1210   BILITOT 0.6 02/14/2015 1103   GFRNONAA >60 02/03/2018 1001  GFRNONAA 83 06/08/2017 1032   GFRAA >60 02/03/2018 1001   GFRAA >89 06/08/2017 1032    Imaging Studies: No results found.  Assessment and Plan:   Rachael Castro is a 84 y.o. y/o female has been referred for history of GIST  Due to loose stools will rule out infectious  causes Will also obtain fecal pancreatic elastase  Pt. Has had a 2014 EGD which was after her 2013 GIST surgery.  Will message Dr. Mike Gip as she has ordered a CT to follow up on her history of GIST and axillary lymph nodes and discuss if a repeat EGD would help for visualization.   (I messaged Dr. Mike Gip about this:  Lequita Asal, MD  Virgel Manifold, MD         No, I don't think we need direct visualization.    If she would have symptoms and scans were negative, I would suggest direct visualization at that time.   Melissa   Previous Messages    ----- Message -----  From: Virgel Manifold, MD  Sent: 06/19/2018  6:32 PM EDT  To: Lequita Asal, MD   Hello Dr. Mike Gip,   I see you have ordered an upcoming CT scan for this patient. She was referred to me for her history of GIST. She had an EGD in 2014 which was reported to be normal and was after her 2013 GIST resection. Would you want visualization of her stomach at this time? Just seeing from your perspective, if there is a need for repeat visualization. Thank you!      Therefore since she is not having any symptoms from her previous GIST and oncology does not need endoscopic visualization at this time will continue follow up in clinic at this time.    Screening colonoscopy are not indicated after 84 years of age and patient has had several colonoscopies in the past including latest one in 2014. Screening colonoscopy not indicated at this time.   Dr Rachael Antigua

## 2018-06-23 ENCOUNTER — Other Ambulatory Visit
Admission: RE | Admit: 2018-06-23 | Discharge: 2018-06-23 | Disposition: A | Discharge status: Home / Self Care | Payer: Medicare Other | Source: Ambulatory Visit | Attending: Gastroenterology | Admitting: Gastroenterology

## 2018-06-23 DIAGNOSIS — R110 Nausea: Secondary | ICD-10-CM | POA: Diagnosis present

## 2018-06-23 DIAGNOSIS — C49A0 Gastrointestinal stromal tumor, unspecified site: Secondary | ICD-10-CM | POA: Insufficient documentation

## 2018-06-23 LAB — GASTROINTESTINAL PANEL BY PCR, STOOL (REPLACES STOOL CULTURE)
ADENOVIRUS F40/41: NOT DETECTED
ASTROVIRUS: NOT DETECTED
Campylobacter species: NOT DETECTED
Cryptosporidium: NOT DETECTED
Cyclospora cayetanensis: NOT DETECTED
ENTEROPATHOGENIC E COLI (EPEC): NOT DETECTED
ENTEROTOXIGENIC E COLI (ETEC): NOT DETECTED
Entamoeba histolytica: NOT DETECTED
Enteroaggregative E coli (EAEC): NOT DETECTED
Giardia lamblia: NOT DETECTED
Norovirus GI/GII: NOT DETECTED
Plesimonas shigelloides: NOT DETECTED
ROTAVIRUS A: NOT DETECTED
Salmonella species: NOT DETECTED
Sapovirus (I, II, IV, and V): NOT DETECTED
Shiga like toxin producing E coli (STEC): NOT DETECTED
Shigella/Enteroinvasive E coli (EIEC): NOT DETECTED
VIBRIO SPECIES: NOT DETECTED
Vibrio cholerae: NOT DETECTED
Yersinia enterocolitica: NOT DETECTED

## 2018-06-23 LAB — C DIFFICILE QUICK SCREEN W PCR REFLEX
C DIFFICILE (CDIFF) INTERP: NOT DETECTED
C DIFFICILE (CDIFF) TOXIN: NEGATIVE
C Diff antigen: NEGATIVE

## 2018-06-25 LAB — PANCREATIC ELASTASE, FECAL

## 2018-07-16 ENCOUNTER — Other Ambulatory Visit: Payer: Self-pay

## 2018-07-16 DIAGNOSIS — Z1231 Encounter for screening mammogram for malignant neoplasm of breast: Secondary | ICD-10-CM

## 2018-08-05 ENCOUNTER — Other Ambulatory Visit: Payer: Self-pay | Admitting: Family Medicine

## 2018-08-19 ENCOUNTER — Other Ambulatory Visit: Payer: Self-pay | Admitting: *Deleted

## 2018-08-19 DIAGNOSIS — Z853 Personal history of malignant neoplasm of breast: Secondary | ICD-10-CM

## 2018-08-25 ENCOUNTER — Inpatient Hospital Stay: Payer: Medicare Other | Attending: Hematology and Oncology

## 2018-08-25 ENCOUNTER — Ambulatory Visit
Admission: RE | Admit: 2018-08-25 | Discharge: 2018-08-25 | Disposition: A | Discharge status: Home / Self Care | Payer: Medicare Other | Source: Ambulatory Visit | Attending: Urgent Care | Admitting: Urgent Care

## 2018-08-25 DIAGNOSIS — Z86018 Personal history of other benign neoplasm: Secondary | ICD-10-CM | POA: Insufficient documentation

## 2018-08-25 DIAGNOSIS — Z9011 Acquired absence of right breast and nipple: Secondary | ICD-10-CM | POA: Diagnosis not present

## 2018-08-25 DIAGNOSIS — Z9221 Personal history of antineoplastic chemotherapy: Secondary | ICD-10-CM | POA: Insufficient documentation

## 2018-08-25 DIAGNOSIS — Z79899 Other long term (current) drug therapy: Secondary | ICD-10-CM | POA: Insufficient documentation

## 2018-08-25 DIAGNOSIS — Z923 Personal history of irradiation: Secondary | ICD-10-CM | POA: Insufficient documentation

## 2018-08-25 DIAGNOSIS — Z170 Estrogen receptor positive status [ER+]: Secondary | ICD-10-CM | POA: Diagnosis not present

## 2018-08-25 DIAGNOSIS — K219 Gastro-esophageal reflux disease without esophagitis: Secondary | ICD-10-CM | POA: Insufficient documentation

## 2018-08-25 DIAGNOSIS — I700 Atherosclerosis of aorta: Secondary | ICD-10-CM | POA: Diagnosis not present

## 2018-08-25 DIAGNOSIS — N200 Calculus of kidney: Secondary | ICD-10-CM | POA: Diagnosis not present

## 2018-08-25 DIAGNOSIS — I2510 Atherosclerotic heart disease of native coronary artery without angina pectoris: Secondary | ICD-10-CM | POA: Diagnosis not present

## 2018-08-25 DIAGNOSIS — E7800 Pure hypercholesterolemia, unspecified: Secondary | ICD-10-CM | POA: Diagnosis not present

## 2018-08-25 DIAGNOSIS — Z853 Personal history of malignant neoplasm of breast: Secondary | ICD-10-CM | POA: Diagnosis present

## 2018-08-25 DIAGNOSIS — I10 Essential (primary) hypertension: Secondary | ICD-10-CM | POA: Insufficient documentation

## 2018-08-25 DIAGNOSIS — Z7982 Long term (current) use of aspirin: Secondary | ICD-10-CM | POA: Diagnosis not present

## 2018-08-25 DIAGNOSIS — C49A0 Gastrointestinal stromal tumor, unspecified site: Secondary | ICD-10-CM | POA: Diagnosis present

## 2018-08-25 DIAGNOSIS — E119 Type 2 diabetes mellitus without complications: Secondary | ICD-10-CM | POA: Insufficient documentation

## 2018-08-25 LAB — CBC WITH DIFFERENTIAL/PLATELET
Abs Immature Granulocytes: 0.01 10*3/uL (ref 0.00–0.07)
Basophils Absolute: 0 10*3/uL (ref 0.0–0.1)
Basophils Relative: 1 %
Eosinophils Absolute: 0.1 10*3/uL (ref 0.0–0.5)
Eosinophils Relative: 2 %
HCT: 35.9 % — ABNORMAL LOW (ref 36.0–46.0)
Hemoglobin: 11.7 g/dL — ABNORMAL LOW (ref 12.0–15.0)
Immature Granulocytes: 0 %
Lymphocytes Relative: 31 %
Lymphs Abs: 2 10*3/uL (ref 0.7–4.0)
MCH: 31.7 pg (ref 26.0–34.0)
MCHC: 32.6 g/dL (ref 30.0–36.0)
MCV: 97.3 fL (ref 80.0–100.0)
Monocytes Absolute: 0.5 10*3/uL (ref 0.1–1.0)
Monocytes Relative: 7 %
Neutro Abs: 3.8 10*3/uL (ref 1.7–7.7)
Neutrophils Relative %: 59 %
Platelets: 234 10*3/uL (ref 150–400)
RBC: 3.69 MIL/uL — ABNORMAL LOW (ref 3.87–5.11)
RDW: 13.2 % (ref 11.5–15.5)
WBC: 6.4 10*3/uL (ref 4.0–10.5)
nRBC: 0 % (ref 0.0–0.2)

## 2018-08-25 LAB — COMPREHENSIVE METABOLIC PANEL
ALT: 18 U/L (ref 0–44)
AST: 23 U/L (ref 15–41)
Albumin: 4.2 g/dL (ref 3.5–5.0)
Alkaline Phosphatase: 63 U/L (ref 38–126)
Anion gap: 10 (ref 5–15)
BUN: 13 mg/dL (ref 8–23)
CO2: 28 mmol/L (ref 22–32)
Calcium: 9.7 mg/dL (ref 8.9–10.3)
Chloride: 103 mmol/L (ref 98–111)
Creatinine, Ser: 0.72 mg/dL (ref 0.44–1.00)
GFR calc Af Amer: >60 mL/min (ref >60)
GFR calc non Af Amer: >60 mL/min (ref >60)
Glucose, Bld: 129 mg/dL — ABNORMAL HIGH (ref 70–99)
Potassium: 4.2 mmol/L (ref 3.5–5.1)
Sodium: 141 mmol/L (ref 135–145)
Total Bilirubin: 0.8 mg/dL (ref 0.3–1.2)
Total Protein: 7.7 g/dL (ref 6.5–8.1)

## 2018-08-26 LAB — CANCER ANTIGEN 27.29: CA 27.29: 50.4 U/mL — ABNORMAL HIGH (ref 0.0–38.6)

## 2018-09-01 ENCOUNTER — Encounter: Payer: Self-pay | Admitting: Hematology and Oncology

## 2018-09-01 ENCOUNTER — Other Ambulatory Visit: Payer: Self-pay

## 2018-09-01 ENCOUNTER — Inpatient Hospital Stay (HOSPITAL_BASED_OUTPATIENT_CLINIC_OR_DEPARTMENT_OTHER): Payer: Medicare Other | Admitting: Hematology and Oncology

## 2018-09-01 VITALS — BP 151/69 | HR 71 | Temp 96.2°F | Resp 18 | Wt 126.9 lb

## 2018-09-01 DIAGNOSIS — Z9011 Acquired absence of right breast and nipple: Secondary | ICD-10-CM

## 2018-09-01 DIAGNOSIS — C49A0 Gastrointestinal stromal tumor, unspecified site: Secondary | ICD-10-CM

## 2018-09-01 DIAGNOSIS — M85852 Other specified disorders of bone density and structure, left thigh: Secondary | ICD-10-CM

## 2018-09-01 DIAGNOSIS — Z79899 Other long term (current) drug therapy: Secondary | ICD-10-CM

## 2018-09-01 DIAGNOSIS — Z853 Personal history of malignant neoplasm of breast: Secondary | ICD-10-CM

## 2018-09-01 DIAGNOSIS — Z170 Estrogen receptor positive status [ER+]: Secondary | ICD-10-CM

## 2018-09-01 DIAGNOSIS — Z923 Personal history of irradiation: Secondary | ICD-10-CM

## 2018-09-01 DIAGNOSIS — E7800 Pure hypercholesterolemia, unspecified: Secondary | ICD-10-CM

## 2018-09-01 DIAGNOSIS — Z7982 Long term (current) use of aspirin: Secondary | ICD-10-CM

## 2018-09-01 DIAGNOSIS — I10 Essential (primary) hypertension: Secondary | ICD-10-CM

## 2018-09-01 DIAGNOSIS — E119 Type 2 diabetes mellitus without complications: Secondary | ICD-10-CM

## 2018-09-01 DIAGNOSIS — K219 Gastro-esophageal reflux disease without esophagitis: Secondary | ICD-10-CM

## 2018-09-01 DIAGNOSIS — Z86018 Personal history of other benign neoplasm: Secondary | ICD-10-CM | POA: Diagnosis not present

## 2018-09-01 DIAGNOSIS — Z9221 Personal history of antineoplastic chemotherapy: Secondary | ICD-10-CM

## 2018-09-01 NOTE — Progress Notes (Signed)
Here for follow up. Feeling " good except for the crook in my neck " no voiced c/o

## 2018-09-01 NOTE — Progress Notes (Signed)
[facility] day:  09/01/18  Chief Complaint: Rachael Castro is a 84 y.o. female with a history of right breast cancer (1999) and gastrointestinal stromal tumor (2013) who is seen for 6 month assessment.  HPI: The patient was last seen in the medical oncology clinic on 02/03/2018.  At that time, she denied any breast or abdominal symptoms. She had chronic cervical neuralgia and tingling in her RIGHT lower extremity. She was on Gabapentin BID and followed up with pain management in [place].  Exam was stable. Labs  were unremarkable.   Chest, abdomen, and pelvic CT on 08/25/2018 revealed a stable exam. The left axillary lymph nodes described on previous exam appeared stable to decreased in size in the interval.  There was no evidence of metastatic disease within the chest, abdomen or pelvis.  There were bilateral kidney stones, aortic atherosclerosis, and multi vessel coronary artery atherosclerotic calcifications.  During the interim, patient is doing "good". She feels generally well. Patient has chronic "tingling" sensation in her RIGHT lower extremity. She denies pain in the leg. Gabapentin has been discontinued. She has been referred to pain management. Patient complains of having a "crick" in her LEFT neck.   Patient denies that she has experienced any B symptoms. She denies any interval infections. She notes that her exertional dyspnea has improved. She denies any episodes of chest pain or palpitations. Previous bowel issues have resolved.  Patient does not verbalize any concerns with regards to her breast today. Patient performs monthly self breast examinations as recommended.   Patient advises that she maintains an adequate appetite. She is eating well. Weight today is 126 lb 14 oz (57.6 kg), which compared to her last visit to the clinic, represents a 4 pound decrease.   Patient denies pain in the clinic today.   Past Medical History:   Diagnosis Date  . Breast cancer ([place]) 1999   right breast  . Diabetes mellitus   . GERD (gastroesophageal reflux disease)   . History of shingles   . Hypercholesteremia   . Hypertension   . Kidney stones   . 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 Osteopenia 12/24/2017   March 2019; next scan March 2021  . Personal history of chemotherapy   . Personal history of colonic polyps   . Thyroid disease     Past Surgical History:  Procedure Laterality Date  . ABDOMINAL HYSTERECTOMY    . APPENDECTOMY    . BREAST BIOPSY Left 09/08/2016   Done by Dr. Jamal Collin  . BREAST SURGERY Right 1999   mastectomy  . CATARACT EXTRACTION W/PHACO Left 04/30/2015   Procedure: CATARACT EXTRACTION PHACO AND INTRAOCULAR LENS PLACEMENT (IOC);  Surgeon: Estill Cotta, MD;  Location: [facility] ORS;  Service: Ophthalmology;  Laterality: Left;  [place] 01:39 AP% 23.3 CDE 40.42 fluid pack lot # [id number]  . COLONOSCOPY  2008   Sankar  . EUS  02/12/2012   Procedure: UPPER ENDOSCOPIC ULTRASOUND (EUS) LINEAR;  Surgeon: Milus Banister, MD;  Location: [facility];  Service: Endoscopy;  Laterality: N/A;  radial linear  . EYE SURGERY Right 2013   cataract  . HERNIA REPAIR  2012  . LAPAROTOMY  2013   excision of gastric wall mass   . MASTECTOMY    . SALPINGOOPHORECTOMY Right 1979  . THYROIDECTOMY  1978  . TONSILLECTOMY    . TUBAL LIGATION    . UPPER GI ENDOSCOPY  2013  Family History  Problem Relation Age of Onset  . Cancer Mother        cervical  . Cancer Father        prostate  . Kidney Stones Brother   . Glaucoma Brother   . Prostatitis Brother   . Diabetes Sister   . Thyroid disease Brother   . Cancer Brother        thyroid  . Arthritis Brother   . Diabetes Sister   . Kidney disease Neg Hx   . Bladder Cancer Neg Hx     Social History:  reports that she has never smoked. She has never used smokeless tobacco. She reports that she does not drink alcohol or use drugs.   She lives in [place].  The patient is alone today.  Allergies: No Known Allergies  Current Medications: Current Outpatient Medications  Medication Sig Dispense Refill  . atorvastatin (LIPITOR) 40 MG tablet Take 1.5 tablets (60 mg total) by mouth at bedtime. 135 tablet 1  . calcium citrate-vitamin D 200-200 MG-UNIT TABS Take 1 tablet by mouth daily.    Marland Kitchen losartan (COZAAR) 25 MG tablet TAKE 1 TABLET BY MOUTH EVERY DAY 90 tablet 1  . Multiple Vitamin (MULTIVITAMIN) tablet Take 1 tablet by mouth daily.    Marland Kitchen omeprazole (PRILOSEC) 20 MG capsule TAKE 1 CAPSULE BY MOUTH EVERY DAY 30 capsule 5  . sitaGLIPtin-metformin (JANUMET) 50-500 MG tablet Take 1 tablet by mouth 2 (two) times daily with a meal. 180 tablet 3  . thiamine (VITAMIN B-1) 100 MG tablet Take 100 mg by mouth daily.    Marland Kitchen triamcinolone cream (KENALOG) 0.1 % Apply 1 application topically 2 (two) times daily. (for the legs) 30 g 0  . gabapentin (NEURONTIN) 300 MG capsule Take 300 mg by mouth as needed.      No current facility-administered medications for this visit.     Review of Systems  Constitutional: Positive for weight loss (down 4 pounds). Negative for diaphoresis, fever and malaise/fatigue.  HENT: Negative.   Eyes: Negative.   Respiratory: Positive for shortness of breath (exertional with long distances). Negative for cough, hemoptysis and sputum production.   Cardiovascular: Negative for chest pain, palpitations, orthopnea, leg swelling and PND.  Gastrointestinal: Negative for abdominal pain, blood in stool, constipation, diarrhea, melena, nausea and vomiting.  Genitourinary: Negative for dysuria, frequency, hematuria and urgency.       BILATERAL nephrolithiasis; small and nonobstructing  Musculoskeletal: Positive for neck pain ("crick" in LEFT neck; known cervical neuralgia). Negative for back pain, falls, joint pain and myalgias.  Skin: Negative for itching and rash.  Neurological: Positive for tingling (RIGHT lower  extremity). Negative for dizziness, tremors, weakness and headaches.  Endo/Heme/Allergies: Does not bruise/bleed easily.  Psychiatric/Behavioral: Negative for depression, memory loss and suicidal ideas. The patient is not nervous/anxious and does not have insomnia.   All other systems reviewed and are negative.  Performance status (ECOG): 1 - Symptomatic but completely ambulatory  Vital Signs BP (!) 151/69 (BP Location: Left Arm, Patient Position: Sitting)   Pulse 71   Temp (!) 96.2 F (35.7 C) (Tympanic)   Resp 18   Wt 126 lb 14 oz (57.6 kg)   BMI 22.47 kg/m   Physical Exam  Constitutional: She is oriented to person, place, and time and well-developed, well-nourished, and in no distress. No distress.  HENT:  Head: Normocephalic and atraumatic.  Mouth/Throat: Oropharynx is clear and moist and mucous membranes are normal. No oropharyngeal exudate.  Eyes: Pupils are  equal, round, and reactive to light. Conjunctivae and EOM are normal. No scleral icterus.  Neck: Normal range of motion. Neck supple. No JVD present.  Cardiovascular: Normal rate, regular rhythm, normal heart sounds and intact distal pulses. Exam reveals no gallop and no friction rub.  No murmur heard. Pulmonary/Chest: Effort normal and breath sounds normal. No respiratory distress. She has no wheezes. She has no rales. Right breast exhibits skin change (Surgically absent). Left breast exhibits inverted nipple. Left breast exhibits no mass, no nipple discharge, no skin change and no tenderness.  Abdominal: Soft. Bowel sounds are normal. She exhibits no distension and no mass. There is no abdominal tenderness. There is no rebound and no guarding.  Musculoskeletal: Normal range of motion. She exhibits no edema or tenderness.  Lymphadenopathy:    She has no cervical adenopathy.    She has no axillary adenopathy.       Right: No inguinal and no supraclavicular adenopathy present.       Left: No inguinal and no supraclavicular  adenopathy present.  Neurological: She is alert and oriented to person, place, and time.  Skin: Skin is warm and dry. No rash noted. She is not diaphoretic. No erythema.  Psychiatric: Mood, affect and judgment normal.  Nursing note and vitals reviewed.   No visits with results within 3 Day(s) from this visit.  Latest known visit with results is:  Appointment on 08/25/2018  Component Date Value Ref Range Status  . CA 27.29 08/25/2018 50.4* 0.0 - 38.6 U/mL Final   Comment: (NOTE) Siemens Centaur Immunochemiluminometric Methodology [facility]) Values obtained with different assay methods or kits cannot be used interchangeably. Results cannot be interpreted as absolute evidence of the presence or absence of malignant disease. Performed At: [facility] [place], [place] [id number] Rush Farmer MD BD:[id number]   . Sodium 08/25/2018 141  135 - 145 mmol/L Final  . Potassium 08/25/2018 4.2  3.5 - 5.1 mmol/L Final  . Chloride 08/25/2018 103  98 - 111 mmol/L Final  . CO2 08/25/2018 28  22 - 32 mmol/L Final  . Glucose, Bld 08/25/2018 129* 70 - 99 mg/dL Final  . BUN 08/25/2018 13  8 - 23 mg/dL Final  . Creatinine, Ser 08/25/2018 0.72  0.44 - 1.00 mg/dL Final  . Calcium 08/25/2018 9.7  8.9 - 10.3 mg/dL Final  . Total Protein 08/25/2018 7.7  6.5 - 8.1 g/dL Final  . Albumin 08/25/2018 4.2  3.5 - 5.0 g/dL Final  . AST 08/25/2018 23  15 - 41 U/L Final  . ALT 08/25/2018 18  0 - 44 U/L Final  . Alkaline Phosphatase 08/25/2018 63  38 - 126 U/L Final  . Total Bilirubin 08/25/2018 0.8  0.3 - 1.2 mg/dL Final  . GFR calc non Af Amer 08/25/2018 >60  >60 mL/min Final  . GFR calc Af Amer 08/25/2018 >60  >60 mL/min Final   Comment: (NOTE) The eGFR has been calculated using the CKD EPI equation. This calculation has not been validated in all clinical situations. eGFR's persistently <60 mL/min signify possible Chronic Kidney Disease.   . Anion gap 08/25/2018 10  5 - 15 Final    Performed at [facility], [address], [place], [place] [address]  . WBC 08/25/2018 6.4  4.0 - 10.5 K/uL Final  . RBC 08/25/2018 3.69* 3.87 - 5.11 MIL/uL Final  . Hemoglobin 08/25/2018 11.7* 12.0 - 15.0 g/dL Final  . HCT 08/25/2018 35.9* 36.0 - 46.0 % Final  .  MCV 08/25/2018 97.3  80.0 - 100.0 fL Final  . MCH 08/25/2018 31.7  26.0 - 34.0 pg Final  . MCHC 08/25/2018 32.6  30.0 - 36.0 g/dL Final  . RDW 08/25/2018 13.2  11.5 - 15.5 % Final  . Platelets 08/25/2018 234  150 - 400 K/uL Final  . nRBC 08/25/2018 0.0  0.0 - 0.2 % Final  . Neutrophils Relative % 08/25/2018 59  % Final  . Neutro Abs 08/25/2018 3.8  1.7 - 7.7 K/uL Final  . Lymphocytes Relative 08/25/2018 31  % Final  . Lymphs Abs 08/25/2018 2.0  0.7 - 4.0 K/uL Final  . Monocytes Relative 08/25/2018 7  % Final  . Monocytes Absolute 08/25/2018 0.5  0.1 - 1.0 K/uL Final  . Eosinophils Relative 08/25/2018 2  % Final  . Eosinophils Absolute 08/25/2018 0.1  0.0 - 0.5 K/uL Final  . Basophils Relative 08/25/2018 1  % Final  . Basophils Absolute 08/25/2018 0.0  0.0 - 0.1 K/uL Final  . Immature Granulocytes 08/25/2018 0  % Final  . Abs Immature Granulocytes 08/25/2018 0.01  0.00 - 0.07 K/uL Final   Performed at [facility], [address], [place], [place] [address]    Assessment:  Rachael Castro is a 84 y.o. female with a history of right breast cancer status post modified radical mastectomy in 1999 followed by 4 cycles of Adriamycin and Cytoxan. She received tamoxifen then extended adjuvant therapy with Femara.    Left sided mammogram on 07/19/2015 revealed no evidence of malignancy.  Left sided mammogram and ultrasound on 09/02/2016 revealed a borderline lymph node in the left axilla, partially behind the lateral edge of the pectoralis muscle.  Left axillary core needle biopsy on 09/08/2016 revealed benign lymphoid hyperplasia.  Left screening mammogram on 09/04/2017 that revealed no mammographic evidence  of malignancy.   CA27.29 was 46.6 on 05/30/2015, 41.9 on 07/09/2015, 42.9 on 11/05/2015, 46.2 on 12/05/2015, 53.5 on 06/04/2016, 50.0 on 08/06/2016, 43.7 on 02/04/2017, 39.2 on 08/05/2017, 44.6 on 02/03/2018, and 50.4 on 08/25/2018.  She has a history of gastrointestinal stromal tumor (GIST) status post resection on 02/27/2012. Pathology revealed a 6.4 cm tumor. She received a year of [place] beginning 03/2012. Her dose was reduced secondary to side effects.  Abdominal and pelvic CT scan on 04/17/2015 revealed postsurgical changes related to the prior gastric resection. There was no evidence of recurrent or metastatic disease. There were multiple nonobstructing bilateral renal calculi measuring up to 2 mm.  Abdominal and pelvic CT scan on 12/03/2015 revealed a stable exam with no evidence of recurrent disease.  There were tiny nonobstructive intrarenal calculi without evidence of hydronephrosis.  Chest, abdomen, and pelvic CT scan with contrast on 06/17/2016 revealed a new solitary mildly enlarged 1.0 cm left axillary lymph node, nonspecific, nodal metastasis not excluded.  There were no additional potential findings of metastatic disease in the chest, abdomen or pelvis.  There was no evidence of local tumor recurrence in the stomach.   CT chest, abdomen, and pelvis on 08/25/2017 demonstrated further enlargement of the LEFT axillary lymph node.  Of note, this area was previously biopsied under ultrasound guidance on 08/2016 and found to be  benign lymphoid hyperplasia.  Axillary node measured 12 mm (previously 10 mm). There was a stable 2 mm lung nodule in the RIGHT middle lobe.  There was a 5 mm subcutaneous nodule laterally in the RIGHT chest wall (stable).  Chest, abdomen, and pelvic CT on 08/25/2018 revealed a stable exam. The  left axillary lymph nodes described on previous exam appeared stable to decreased in size in the interval.  There was no evidence of metastatic disease within the chest,  abdomen or pelvis.    Bone density on 12/24/2017 revealed osteopenia with a T score of -1.8 in the left femoral neck.  Symptomatically, she is doing well overall.  She complains of having a "crick in the left side of her neck.  Patient has chronic cervical neuralgia, which causes her to experience a tingling sensation in the right lower extremity.  Patient is no longer taking gabapentin.  She is followed by pain management in [place].  Exam is grossly unremarkable.  WBC 6400 ([place] 3800). CA27.29 elevated at 50.4 U/mL(previously 44.6).  Plan: 1.  Labs today:  CBC with diff, CMP, CA27.29. 2.  Right breast cancer  Doing well.  No symptoms or breast concerns.   LEFT mammogram scheduled for 09/06/2018.  Discuss fluctuating CA27.29.   Review interval CT scans.  Images personally reviewed.  Agree with radiology interpretation.  No evidence of malignancy.  Discussed continued monitoring of tumor marker.  If outside of baseline range, will need to consider PET imaging.  3.  Gastrointestinal stromal cancer  Review interval CT scans- no evidence of recurrence. 4.  Osteopenia  Continues on daily calcium and vitamin D supplementation. 5.  BILATERAL nephrolithiasis  Non-obstructing.  No evidence of hydronephrosis on recent CT imaging.  Patient asymptomatic; no flank pain, CVA tenderness, or dysuria.  Encourage patient to follow-up with PCP and/or urology PRN should she develop urinary symptoms. 6.  Cervical neuralgia  Chronic condition causing tingling sensation in the right lower extremity.  Patient complains of a "crick" in the left side of her neck.  Patient being followed by pain management in [place]. 7.  RTC in 6 months for MD assessment and labs (CBC with diff, CMP, CA27.29).   Honor Loh, NP  09/01/2018, 11:04 AM   I saw and evaluated the patient, participating in the key portions of the service and reviewing pertinent diagnostic studies and records.  I reviewed the  nurse practitioner's note and agree with the findings and the plan.  The assessment and plan were discussed with the patient.  Several questions were asked by the patient and answered.   Nolon Stalls, MD 09/01/18, 11:04 AM

## 2018-09-01 NOTE — Progress Notes (Signed)
09/02/18 11:47 AM   Rachael Castro 01-Nov-1930 [id number]  Referring provider: Arnetha Courser, MD [address] [place], [place] [address]  No chief complaint on file.   HPI: Patient is an 84 year old African American female who presents today for a one year follow up for nephrolithiasis. Pt will have a follow-up in one year for a KUB/recheck.   Patient has not had any flank pain, blood in the urine or passage of fragments.  She has no other urinary symptoms at this time.  She has not had any recent fevers, chills, nausea or vomiting.    Non contrast CT was performed on 08/25/2018 revealed Normal adrenal glands.  Small bilateral lower pole nonobstructing renal calculi appears similar to previous exam. No mass or hydronephrosis identified bilaterally. Urinary bladder appears normal.   Pt reports doing well overall. Pt reports that her oncologist told her that she had kidney stones which were indicated on a CT scan. Pt denies pain or any troubles urinating. She denies pain in kidneys.  PMH: Past Medical History:  Diagnosis Date  . Breast cancer ([place]) 1999   right breast  . Diabetes mellitus   . GERD (gastroesophageal reflux disease)   . History of shingles   . Hypercholesteremia   . Hypertension   . Kidney stones   . 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 Osteopenia 12/24/2017   March 2019; next scan March 2021  . Personal history of chemotherapy   . Personal history of colonic polyps   . Thyroid disease     Surgical History: Past Surgical History:  Procedure Laterality Date  . ABDOMINAL HYSTERECTOMY    . APPENDECTOMY    . BREAST BIOPSY Left 09/08/2016   Done by Dr. Jamal Collin  . BREAST SURGERY Right 1999   mastectomy  . CATARACT EXTRACTION W/PHACO Left 04/30/2015   Procedure: CATARACT EXTRACTION PHACO AND INTRAOCULAR LENS PLACEMENT (IOC);  Surgeon: Estill Cotta, MD;  Location: [facility];  Service: Ophthalmology;   Laterality: Left;  [place] 01:39 AP% 23.3 CDE 40.42 fluid pack lot # [id number]  . COLONOSCOPY  2008   Sankar  . EUS  02/12/2012   Procedure: UPPER ENDOSCOPIC ULTRASOUND (EUS) LINEAR;  Surgeon: Milus Banister, MD;  Location: [facility];  Service: Endoscopy;  Laterality: N/A;  radial linear  . EYE SURGERY Right 2013   cataract  . HERNIA REPAIR  2012  . LAPAROTOMY  2013   excision of gastric wall mass   . MASTECTOMY    . SALPINGOOPHORECTOMY Right 1979  . THYROIDECTOMY  1978  . TONSILLECTOMY    . TUBAL LIGATION    . UPPER GI ENDOSCOPY  2013    Home Medications:  Allergies as of 09/02/2018   No Known Allergies     Medication List        Accurate as of 09/02/18 11:47 AM. Always use your most recent med list.          atorvastatin 40 MG tablet Commonly known as:  LIPITOR Take 1.5 tablets (60 mg total) by mouth at bedtime.   calcium citrate-vitamin D 200-200 MG-UNIT Tabs Take 1 tablet by mouth daily.   losartan 25 MG tablet Commonly known as:  COZAAR TAKE 1 TABLET BY MOUTH EVERY DAY   multivitamin tablet Take 1 tablet by mouth daily.   omeprazole 20 MG capsule Commonly known as:  PRILOSEC TAKE 1 CAPSULE BY MOUTH EVERY DAY   sitaGLIPtin-metformin 50-500  MG tablet Commonly known as:  JANUMET Take 1 tablet by mouth 2 (two) times daily with a meal.   thiamine 100 MG tablet Commonly known as:  VITAMIN B-1 Take 100 mg by mouth daily.   triamcinolone cream 0.1 % Commonly known as:  KENALOG Apply 1 application topically 2 (two) times daily. (for the legs)       Allergies: No Known Allergies  Family History: Family History  Problem Relation Age of Onset  . Cancer Mother        cervical  . Cancer Father        prostate  . Kidney Stones Brother   . Glaucoma Brother   . Prostatitis Brother   . Diabetes Sister   . Thyroid disease Brother   . Cancer Brother        thyroid  . Arthritis Brother   . Diabetes Sister   . Kidney disease Neg Hx   . Bladder Cancer  Neg Hx     Social History:  reports that she has never smoked. She has never used smokeless tobacco. She reports that she does not drink alcohol or use drugs.  ROS: UROLOGY Frequent Urination?: No Hard to postpone urination?: No Burning/pain with urination?: No Get up at night to urinate?: No Leakage of urine?: No Urine stream starts and stops?: No Trouble starting stream?: No Do you have to strain to urinate?: No Blood in urine?: No Urinary tract infection?: No Sexually transmitted disease?: No Injury to kidneys or bladder?: No Painful intercourse?: No Weak stream?: No Currently pregnant?: No Vaginal bleeding?: No  Gastrointestinal Nausea?: No Vomiting?: No Indigestion/heartburn?: No Diarrhea?: No Constipation?: No  Constitutional Fever: No Night sweats?: No Weight loss?: No Fatigue?: No  Skin Skin rash/lesions?: Yes Itching?: No  Eyes Blurred vision?: No Double vision?: No  Ears/Nose/Throat Sore throat?: No Sinus problems?: No  Hematologic/Lymphatic Swollen glands?: No Easy bruising?: No  Cardiovascular Leg swelling?: No Chest pain?: No  Respiratory Cough?: No Shortness of breath?: No  Endocrine Excessive thirst?: No  Musculoskeletal Back pain?: No Joint pain?: Yes  Neurological Headaches?: No Dizziness?: No  Psychologic Depression?: No Anxiety?: No  Physical Exam: BP (!) 149/55 (BP Location: Left Arm, Patient Position: Sitting)   Pulse 71   Ht 5\' 4"  (1.626 m)   Wt 126 lb 3.2 oz (57.2 kg)   BMI 21.66 kg/m   Constitutional: Well nourished. Alert and oriented, No acute distress. HEENT: [place] AT, moist mucus membranes. Trachea midline, no masses. Cardiovascular: No clubbing, cyanosis, or edema. Respiratory: Normal respiratory effort, no increased work of breathing. Skin: No rashes, bruises or suspicious lesions. Lymph: No cervical or inguinal adenopathy. Neurologic: Grossly intact, no focal deficits, moving all 4  extremities. Psychiatric: Normal mood and affect.   Laboratory Data: Lab Results  Component Value Date   WBC 6.4 08/25/2018   HGB 11.7 (L) 08/25/2018   HCT 35.9 (L) 08/25/2018   MCV 97.3 08/25/2018   PLT 234 08/25/2018    Lab Results  Component Value Date   CREATININE 0.72 08/25/2018    Lab Results  Component Value Date   HGBA1C 6.6 (H) 06/09/2018       Component Value Date/Time   CHOL 173 06/09/2018 1145   CHOL 224 (H) 06/18/2015 1210   HDL 47 (L) 06/09/2018 1145   HDL 57 06/18/2015 1210   CHOLHDL 3.7 06/09/2018 1145   VLDL 24 06/08/2017 1032   LDLCALC 98 06/09/2018 1145    Lab Results  Component Value Date  AST 23 08/25/2018   Lab Results  Component Value Date   ALT 18 08/25/2018   I have reviewed the labs.  Pertinent Imaging: CLINICAL DATA:  Right breast cancer. Gastrointestinal stromal tumor. Follow-up examination.  EXAM: CT CHEST, ABDOMEN AND PELVIS WITHOUT CONTRAST  TECHNIQUE: Multidetector CT imaging of the chest, abdomen and pelvis was performed following the standard protocol without IV contrast.  COMPARISON:  08/25/2017  FINDINGS: CT CHEST FINDINGS  Cardiovascular: The heart size appears within normal limits. There is no pericardial effusion identified. Aortic atherosclerosis. Calcifications within the RCA, LAD and left circumflex coronary arteries noted.  Mediastinum/Nodes: Normal appearance of the thyroid gland. The trachea appears patent and is midline. Normal appearance of the esophagus. No enlarged supraclavicular, mediastinal, or hilar lymph nodes.Prominent left axillary lymph nodes identified. Index lymph node measures 8 mm, image 9/2. Previously 1.2 cm. The index left retropectoral node measures 5 mm, image 10/2. Previously 6 mm. Stable small right axillary lymph node measuring 5 mm, image 26/2.  Lungs/Pleura: No pleural effusion. No airspace consolidation, atelectasis or pneumothorax identified. Small pulmonary  nodule within the right lower lobe measures 3 mm, image 90/4. Unchanged. Posterior right upper lobe perifissural nodule is unchanged measuring 3 mm, image 38/4. Calcified granuloma identified within the right middle lobe and superior segment of right lower lobe.  Musculoskeletal: Previous right mastectomy. There are no aggressive lytic or sclerotic bone lesions identified.  CT ABDOMEN PELVIS FINDINGS  Hepatobiliary: No focal liver abnormality is seen. No gallstones, gallbladder wall thickening, or biliary dilatation.  Pancreas: Unremarkable. No pancreatic ductal dilatation or surrounding inflammatory changes.  Spleen: Normal in size without focal abnormality.  Adrenals/Urinary Tract: Normal adrenal glands.  Small bilateral lower pole nonobstructing renal calculi appears similar to previous exam. No mass or hydronephrosis identified bilaterally. Urinary bladder appears normal.  Stomach/Bowel: Small hiatal hernia. Stable postoperative changes involving the stomach. No findings to suggest recurrent mass. The small bowel loops have a normal caliber. No findings to suggest bowel obstruction. Normal appearance of the colon.  Vascular/Lymphatic: Aortic atherosclerosis. No aneurysm. No abdominal adenopathy. No pelvic or inguinal adenopathy identified.  Reproductive: Status post hysterectomy. No adnexal masses.  Other: No free fluid or fluid collections.  Musculoskeletal: No aggressive lytic or sclerotic bone lesions.  IMPRESSION: 1. Stable exam. The left axillary lymph nodes described on previous exam appear stable to decreased in size in the interval. 2. No evidence of metastatic disease within the chest, abdomen or pelvis. 3. Bilateral kidney stones 4.  Aortic Atherosclerosis (ICD10-I70.0). 5. Multi vessel coronary artery atherosclerotic calcifications.   Electronically Signed   By: Kerby Moors M.D.   On: 08/25/2018 13:50    I have independently  reviewed the films with the patient and note L>R bilateral nephrolithiasis.    Assessment & Plan:    1. Bilateral nephrolithiasis no intervention warranted at this time continue yearly KUB's contact us if she experiences flank pain or gross hematuria  Advised to contact our office or seek treatment in the ED if becomes febrile or pain/ vomiting are difficult control in order to arrange for emergent/urgent intervention Non-contrast CT scan reveals small stones in left and right kidneys. Left side has more.  KUB/recheck in one year    Return in about 1 year (around 09/03/2019) for KUB/recheck.  These notes generated with voice recognition software. I apologize for typographical errors.  Zara Council, PA-C  [facility] [address] [place], [place] [address] [phone]  I, Lucas Mallow, am acting  as a Education administrator for [employer],  I have reviewed the above documentation for accuracy and completeness, and I agree with the above.    Zara Council, PA-C

## 2018-09-02 ENCOUNTER — Encounter: Payer: Self-pay | Admitting: Urology

## 2018-09-02 ENCOUNTER — Ambulatory Visit: Payer: Medicare Other | Admitting: Urology

## 2018-09-02 VITALS — BP 149/55 | HR 71 | Ht 64.0 in | Wt 126.2 lb

## 2018-09-02 DIAGNOSIS — N200 Calculus of kidney: Secondary | ICD-10-CM

## 2018-09-06 ENCOUNTER — Ambulatory Visit
Admission: RE | Admit: 2018-09-06 | Discharge: 2018-09-06 | Disposition: A | Discharge status: Home / Self Care | Payer: Medicare Other | Source: Ambulatory Visit | Attending: General Surgery | Admitting: General Surgery

## 2018-09-06 DIAGNOSIS — Z1231 Encounter for screening mammogram for malignant neoplasm of breast: Secondary | ICD-10-CM

## 2018-09-14 ENCOUNTER — Encounter: Payer: Self-pay | Admitting: General Surgery

## 2018-09-14 ENCOUNTER — Other Ambulatory Visit: Payer: Self-pay

## 2018-09-14 ENCOUNTER — Ambulatory Visit: Payer: Medicare Other | Admitting: General Surgery

## 2018-09-14 VITALS — BP 158/78 | HR 67 | Temp 97.7°F | Resp 14 | Ht 64.0 in | Wt 127.0 lb

## 2018-09-14 DIAGNOSIS — Z1211 Encounter for screening for malignant neoplasm of colon: Secondary | ICD-10-CM

## 2018-09-14 DIAGNOSIS — Z853 Personal history of malignant neoplasm of breast: Secondary | ICD-10-CM

## 2018-09-14 NOTE — Progress Notes (Signed)
Patient ID: Rachael Castro, female   DOB: January 30, 1931, 84 y.o.   MRN: [id number]  Chief Complaint  Patient presents with  . Follow-up    1 yr f/u rec [place], 09/06/18, also Colonoscopy discussion    HPI Rachael Castro is a 84 y.o. female here today to follow up for 1 yr [place], 09/06/18, and Colonoscopy discussion. Patient states she is feeling well.   Past Medical History:  Diagnosis Date  . Breast cancer ([place]) 1999   right breast  . Diabetes mellitus   . GERD (gastroesophageal reflux disease)   . History of shingles   . Hypercholesteremia   . Hypertension   . Kidney stones   . 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 Osteopenia 12/24/2017   March 2019; next scan March 2021  . Personal history of chemotherapy   . Personal history of colonic polyps   . Thyroid disease     Past Surgical History:  Procedure Laterality Date  . ABDOMINAL HYSTERECTOMY    . APPENDECTOMY    . BREAST BIOPSY Left 09/08/2016   Done by Dr. Jamal Collin  . BREAST EXCISIONAL BIOPSY Right 1999   Mastectomy  . BREAST SURGERY Right 1999   mastectomy  . CATARACT EXTRACTION W/PHACO Left 04/30/2015   Procedure: CATARACT EXTRACTION PHACO AND INTRAOCULAR LENS PLACEMENT (IOC);  Surgeon: Estill Cotta, MD;  Location: [facility];  Service: Ophthalmology;  Laterality: Left;  [place] 01:39 AP% 23.3 CDE 40.42 fluid pack lot # [id number]  . COLONOSCOPY  2008   Sankar  . EUS  02/12/2012   Procedure: UPPER ENDOSCOPIC ULTRASOUND (EUS) LINEAR;  Surgeon: Milus Banister, MD;  Location: [facility];  Service: Endoscopy;  Laterality: N/A;  radial linear  . EYE SURGERY Right 2013   cataract  . HERNIA REPAIR  2012  . LAPAROTOMY  2013   excision of gastric wall mass   . MASTECTOMY    . SALPINGOOPHORECTOMY Right 1979  . THYROIDECTOMY  1978  . TONSILLECTOMY    . TUBAL LIGATION    . UPPER GI ENDOSCOPY  2013    Family History  Problem Relation Age of Onset  .  Cancer Mother        cervical  . Cancer Father        prostate  . Kidney Stones Brother   . Glaucoma Brother   . Prostatitis Brother   . Diabetes Sister   . Thyroid disease Brother   . Cancer Brother        thyroid  . Arthritis Brother   . Diabetes Sister   . Kidney disease Neg Hx   . Bladder Cancer Neg Hx   . Breast cancer Neg Hx     Social History Social History   Tobacco Use  . Smoking status: Never Smoker  . Smokeless tobacco: Never Used  Substance Use Topics  . Alcohol use: No  . Drug use: No    No Known Allergies  Current Outpatient Medications  Medication Sig Dispense Refill  . atorvastatin (LIPITOR) 40 MG tablet Take 1.5 tablets (60 mg total) by mouth at bedtime. 135 tablet 1  . calcium citrate-vitamin D 200-200 MG-UNIT TABS Take 1 tablet by mouth daily.    Marland Kitchen losartan (COZAAR) 25 MG tablet TAKE 1 TABLET BY MOUTH EVERY DAY 90 tablet 1  . Multiple Vitamin (MULTIVITAMIN) tablet Take 1 tablet by mouth daily.    Marland Kitchen omeprazole (PRILOSEC) 20  MG capsule TAKE 1 CAPSULE BY MOUTH EVERY DAY 30 capsule 5  . sitaGLIPtin-metformin (JANUMET) 50-500 MG tablet Take 1 tablet by mouth 2 (two) times daily with a meal. 180 tablet 3  . thiamine (VITAMIN B-1) 100 MG tablet Take 100 mg by mouth daily.    Marland Kitchen triamcinolone cream (KENALOG) 0.1 % Apply 1 application topically 2 (two) times daily. (for the legs) 30 g 0   No current facility-administered medications for this visit.     Review of Systems Review of Systems  Constitutional: Negative.   Respiratory: Negative.   Cardiovascular: Negative.     Blood pressure (!) 158/78, pulse 67, temperature 97.7 F (36.5 C), temperature source Temporal, resp. rate 14, height 5\' 4"  (1.626 m), weight 127 lb (57.6 kg), SpO2 96 %.  Physical Exam Physical Exam  Constitutional: She is oriented to person, place, and time. She appears well-developed and well-nourished.  Eyes: Conjunctivae are normal. No scleral icterus.  Cardiovascular: Normal  rate, regular rhythm and normal heart sounds.  Pulmonary/Chest: Effort normal and breath sounds normal. Right breast exhibits no inverted nipple, no mass, no nipple discharge, no skin change and no tenderness.  Lymphadenopathy:    She has no cervical adenopathy.  Neurological: She is alert and oriented to person, place, and time.  Skin: Skin is warm and dry.    Data Reviewed September 06, 2018 left breast screening mammogram was reviewed.  Near fatty replaced breast.  BI-RADS-1.  June 14, 2018 GI evaluation with Dr.Tahiliani reviewed.  CT of the chest abdomen and pelvis of August 25, 2018 showed no evidence of metastatic disease.  Assessment    No evidence of recurrent right breast cancer.  Benign left mammogram.  Past history of a colonic polyp, negative exam in 2014.  6 years status post GIST tumor resection from the stomach.    Plan  Patient is to return to the office in 1 year with uni left mammogram.   Follow up with Dr.Tahiliani as scheduled.  Would not consider the patient for repeat colonoscopy, but she may be a candidate for Cologuard testing as her performance status is excellent.  HPI, Physical Exam, Assessment and Plan have been scribed under the direction and in the presence of Hervey Ard, Md.  Eudelia Bunch R. Bobette Mo, CMA  I have completed the exam and reviewed the above documentation for accuracy and completeness.  I agree with the above.  Haematologist has been used and any errors in dictation or transcription are unintentional.  Hervey Ard, M.D., F.A.C.S. [place] 09/14/2018, 4:09 PM

## 2018-09-14 NOTE — Patient Instructions (Addendum)
Patient is to return to the office in 1 year with uni left mammogram. Follow up with Dr.Tahiliani as scheduled.

## 2018-09-20 ENCOUNTER — Ambulatory Visit: Payer: Medicare Other | Admitting: Gastroenterology

## 2018-09-21 ENCOUNTER — Ambulatory Visit: Payer: Medicare Other | Admitting: Gastroenterology

## 2018-09-21 ENCOUNTER — Ambulatory Visit (INDEPENDENT_AMBULATORY_CARE_PROVIDER_SITE_OTHER): Payer: Medicare Other

## 2018-09-21 ENCOUNTER — Encounter: Payer: Self-pay | Admitting: Gastroenterology

## 2018-09-21 VITALS — BP 165/67 | HR 67 | Ht 64.0 in | Wt 126.6 lb

## 2018-09-21 VITALS — BP 142/62 | HR 88 | Temp 97.8°F | Resp 16 | Ht 64.0 in | Wt 125.9 lb

## 2018-09-21 DIAGNOSIS — M6289 Other specified disorders of muscle: Secondary | ICD-10-CM

## 2018-09-21 DIAGNOSIS — Z0000 Encounter for general adult medical examination without abnormal findings: Secondary | ICD-10-CM | POA: Diagnosis not present

## 2018-09-21 NOTE — Progress Notes (Addendum)
Rachael Antigua, MD [address]  [place]  [place], [place] [address]  Main: [phone]  Fax: [phone]   Primary Care Physician: Arnetha Courser, MD  Primary Gastroenterologist:  Dr. Vonda Castro  Chief Complaint  Patient presents with  . Follow-up    GIST    HPI: Rachael Castro is a 84 y.o. 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 The patient denies abdominal or flank pain, anorexia, nausea or vomiting, dysphagia, change in bowel habits or black or bloody stools or weight loss.  Patient reports intermittent fecal smearing when sleeping.  Has noticed this once or twice.  She states she wakes up with a small equals near in her underwear.  This does not occur during the day.  But does report some urgency during the day when she has to have a bowel movement.  States her bowel movements are formed and not loose.  No blood in stool.  States the fecal smearing occurs when she eats yogurt or dairy products right before bedtime and does not occur otherwise.  Denies any abdominal cramping or bloating after eating dairy products.  Previous history: Patient with history of gist in 2013, status post resection and Gleevec therapy, under imaging surveillance by oncology.  Denies any abdominal pain, weight loss, heartburn, dysphagia, melena, hematochezia. Patient is reporting loose stools over the last 1-2 months. 1-2 loose BMs a day.   EGD in April 2013 by Dr. Minna Merritts with 3 cm gastric fundus mass noted.  She was then referred for an EUS which was also done in April 2013 which noted a 6 cm subepithelial gastric mass communicating with the muscularis propria layer of the gastric wall.  EUS FNA was done and sent for cytology.  No malignant cells identified.  Pathology was nondiagnostic.  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  She underwent laparotomy resection in May 2013, and pathology from the resection showed spindle cell neoplasm consistent with  Gist.  As per her oncologist note, Dr. Mike Gip, she received a year of Wagoner beginning June 2013 and dose was reduced secondary to side effects.  She is under annual surveillance imaging of chest abdomen pelvis by Dr. Mike Gip.  She also has history of breast cancer in 1999.  As per Dr. Sonia Baller last note "Patient underwent annual surveillance imaging of her chest, abdomen, and pelvis on 08/25/2017 for her GIST.Study demonstrated further enlargement of theLEFTaxillary lymph node. Of note, this area was previously biopsied under ultrasound guidance on 08/2016 and found to be benign lymphoid hyperplasia.Axillary node measured12 mm(previously 10 mm).Therewasa stable 2 mm lung nodule in the RIGHTmiddle lobe.Additionally, there was mention of a 5 mm subcutaneous nodule laterally in the RIGHTchest wall that was also stable."  Reports available in provation:  Last colonoscopy, 2014, by Dr. Jamal Collin done for history of polyps was normal. Colon Was reported to be tortuous.  Withdrawal time was less than 6 minutes.  EGD was done at the same time, and was reported to be normal.  2008 colonoscopy by Dr. Jamal Collin was normal  2006 colonoscopy by Dr. Nicolasa Ducking, showed a frond like villous non-obstructing small mass in the ascending colon.  It was noted to be non-circumferential, and oozing.  The report from this procedure is somewhat confusing, as in one area it states polypectomy was done with a hot snare.  But under the impression it states "Tumor. Removal was not done". Repeat was recommended in 1 year.   2001 colonoscopy by Dr.  Alveta Heimlich. Very tortuous colon and internal hemorrhoids reported. Extent of exam cecum.   Current Outpatient Medications  Medication Sig Dispense Refill  . atorvastatin (LIPITOR) 40 MG tablet Take 1.5 tablets (60 mg total) by mouth at bedtime. 135 tablet 1  . calcium citrate-vitamin D 200-200 MG-UNIT TABS Take 1 tablet by mouth daily.    Marland Kitchen losartan (COZAAR) 25 MG  tablet TAKE 1 TABLET BY MOUTH EVERY DAY 90 tablet 1  . Multiple Vitamin (MULTIVITAMIN) tablet Take 1 tablet by mouth daily.    Marland Kitchen omeprazole (PRILOSEC) 20 MG capsule TAKE 1 CAPSULE BY MOUTH EVERY DAY 30 capsule 5  . sitaGLIPtin-metformin (JANUMET) 50-500 MG tablet Take 1 tablet by mouth 2 (two) times daily with a meal. 180 tablet 3  . thiamine (VITAMIN B-1) 100 MG tablet Take 100 mg by mouth daily.    Marland Kitchen triamcinolone cream (KENALOG) 0.1 % Apply 1 application topically 2 (two) times daily. (for the legs) 30 g 0   No current facility-administered medications for this visit.     Allergies as of 09/21/2018  . (No Known Allergies)    ROS:  General: Negative for anorexia, weight loss, fever, chills, fatigue, weakness. ENT: Negative for hoarseness, difficulty swallowing , nasal congestion. CV: Negative for chest pain, angina, palpitations, dyspnea on exertion, peripheral edema.  Respiratory: Negative for dyspnea at rest, dyspnea on exertion, cough, sputum, wheezing.  GI: See history of present illness. GU:  Negative for dysuria, hematuria, urinary incontinence, urinary frequency, nocturnal urination.  Endo: Negative for unusual weight change.    Physical Examination:   BP (!) 165/67   Pulse 67   Ht 5\' 4"  (1.626 m)   Wt 126 lb 9.6 oz (57.4 kg)   BMI 21.73 kg/m   General: Well-nourished, well-developed in no acute distress.  Eyes: No icterus. Conjunctivae pink. Mouth: Oropharyngeal mucosa moist and pink , no lesions erythema or exudate. Neck: Supple, Trachea midline Abdomen: Bowel sounds are normal, nontender, nondistended, no hepatosplenomegaly or masses, no abdominal bruits or hernia , no rebound or guarding.   Rectal: No external hemorrhoids, solid stool in rectal vault, no masses in rectal vault on digital rectal exam Extremities: No lower extremity edema. No clubbing or deformities. Neuro: Alert and oriented x 3.  Grossly intact. Skin: Warm and dry, no jaundice.   Psych: Alert  and cooperative, normal mood and affect.   Labs: CMP     Component Value Date/Time   NA 141 08/25/2018 0934   NA 144 06/18/2015 1210   NA 137 02/14/2015 1103   K 4.2 08/25/2018 0934   K 4.0 02/14/2015 1103   CL 103 08/25/2018 0934   CL 101 02/14/2015 1103   CO2 28 08/25/2018 0934   CO2 27 02/14/2015 1103   GLUCOSE 129 (H) 08/25/2018 0934   GLUCOSE 141 (H) 02/14/2015 1103   BUN 13 08/25/2018 0934   BUN 16 06/18/2015 1210   BUN 17 02/14/2015 1103   CREATININE 0.72 08/25/2018 0934   CREATININE 0.64 06/08/2017 1032   CALCIUM 9.7 08/25/2018 0934   CALCIUM 9.3 02/14/2015 1103   PROT 7.7 08/25/2018 0934   PROT 6.9 06/18/2015 1210   PROT 7.2 02/14/2015 1103   ALBUMIN 4.2 08/25/2018 0934   ALBUMIN 4.5 06/18/2015 1210   ALBUMIN 4.0 02/14/2015 1103   AST 23 08/25/2018 0934   AST 23 02/14/2015 1103   ALT 18 08/25/2018 0934   ALT 14 02/14/2015 1103   ALKPHOS 63 08/25/2018 0934   ALKPHOS 81 02/14/2015 1103  BILITOT 0.8 08/25/2018 0934   BILITOT 0.3 06/18/2015 1210   BILITOT 0.6 02/14/2015 1103   GFRNONAA >60 08/25/2018 0934   GFRNONAA 83 06/08/2017 1032   GFRAA >60 08/25/2018 0934   GFRAA >89 06/08/2017 1032   Lab Results  Component Value Date   WBC 6.4 08/25/2018   HGB 11.7 (L) 08/25/2018   HCT 35.9 (L) 08/25/2018   MCV 97.3 08/25/2018   PLT 234 08/25/2018    Imaging Studies: Ct Abdomen Pelvis Wo Contrast  Result Date: 08/25/2018 CLINICAL DATA:  Right breast cancer. Gastrointestinal stromal tumor. Follow-up examination. EXAM: CT CHEST, ABDOMEN AND PELVIS WITHOUT CONTRAST TECHNIQUE: Multidetector CT imaging of the chest, abdomen and pelvis was performed following the standard protocol without IV contrast. COMPARISON:  08/25/2017 FINDINGS: CT CHEST FINDINGS Cardiovascular: The heart size appears within normal limits. There is no pericardial effusion identified. Aortic atherosclerosis. Calcifications within the RCA, LAD and left circumflex coronary arteries noted.  Mediastinum/Nodes: Normal appearance of the thyroid gland. The trachea appears patent and is midline. Normal appearance of the esophagus. No enlarged supraclavicular, mediastinal, or hilar lymph nodes.Prominent left axillary lymph nodes identified. Index lymph node measures 8 mm, image 9/2. Previously 1.2 cm. The index left retropectoral node measures 5 mm, image 10/2. Previously 6 mm. Stable small right axillary lymph node measuring 5 mm, image 26/2. Lungs/Pleura: No pleural effusion. No airspace consolidation, atelectasis or pneumothorax identified. Small pulmonary nodule within the right lower lobe measures 3 mm, image 90/4. Unchanged. Posterior right upper lobe perifissural nodule is unchanged measuring 3 mm, image 38/4. Calcified granuloma identified within the right middle lobe and superior segment of right lower lobe. Musculoskeletal: Previous right mastectomy. There are no aggressive lytic or sclerotic bone lesions identified. CT ABDOMEN PELVIS FINDINGS Hepatobiliary: No focal liver abnormality is seen. No gallstones, gallbladder wall thickening, or biliary dilatation. Pancreas: Unremarkable. No pancreatic ductal dilatation or surrounding inflammatory changes. Spleen: Normal in size without focal abnormality. Adrenals/Urinary Tract: Normal adrenal glands. Small bilateral lower pole nonobstructing renal calculi appears similar to previous exam. No mass or hydronephrosis identified bilaterally. Urinary bladder appears normal. Stomach/Bowel: Small hiatal hernia. Stable postoperative changes involving the stomach. No findings to suggest recurrent mass. The small bowel loops have a normal caliber. No findings to suggest bowel obstruction. Normal appearance of the colon. Vascular/Lymphatic: Aortic atherosclerosis. No aneurysm. No abdominal adenopathy. No pelvic or inguinal adenopathy identified. Reproductive: Status post hysterectomy. No adnexal masses. Other: No free fluid or fluid collections. Musculoskeletal:  No aggressive lytic or sclerotic bone lesions. IMPRESSION: 1. Stable exam. The left axillary lymph nodes described on previous exam appear stable to decreased in size in the interval. 2. No evidence of metastatic disease within the chest, abdomen or pelvis. 3. Bilateral kidney stones 4.  Aortic Atherosclerosis (ICD10-I70.0). 5. Multi vessel coronary artery atherosclerotic calcifications. Electronically Signed   By: Kerby Moors M.D.   On: 08/25/2018 13:50   Ct Chest Wo Contrast  Result Date: 08/25/2018 CLINICAL DATA:  Right breast cancer. Gastrointestinal stromal tumor. Follow-up examination. EXAM: CT CHEST, ABDOMEN AND PELVIS WITHOUT CONTRAST TECHNIQUE: Multidetector CT imaging of the chest, abdomen and pelvis was performed following the standard protocol without IV contrast. COMPARISON:  08/25/2017 FINDINGS: CT CHEST FINDINGS Cardiovascular: The heart size appears within normal limits. There is no pericardial effusion identified. Aortic atherosclerosis. Calcifications within the RCA, LAD and left circumflex coronary arteries noted. Mediastinum/Nodes: Normal appearance of the thyroid gland. The trachea appears patent and is midline. Normal appearance of the esophagus. No enlarged supraclavicular, mediastinal,  or hilar lymph nodes.Prominent left axillary lymph nodes identified. Index lymph node measures 8 mm, image 9/2. Previously 1.2 cm. The index left retropectoral node measures 5 mm, image 10/2. Previously 6 mm. Stable small right axillary lymph node measuring 5 mm, image 26/2. Lungs/Pleura: No pleural effusion. No airspace consolidation, atelectasis or pneumothorax identified. Small pulmonary nodule within the right lower lobe measures 3 mm, image 90/4. Unchanged. Posterior right upper lobe perifissural nodule is unchanged measuring 3 mm, image 38/4. Calcified granuloma identified within the right middle lobe and superior segment of right lower lobe. Musculoskeletal: Previous right mastectomy. There are no  aggressive lytic or sclerotic bone lesions identified. CT ABDOMEN PELVIS FINDINGS Hepatobiliary: No focal liver abnormality is seen. No gallstones, gallbladder wall thickening, or biliary dilatation. Pancreas: Unremarkable. No pancreatic ductal dilatation or surrounding inflammatory changes. Spleen: Normal in size without focal abnormality. Adrenals/Urinary Tract: Normal adrenal glands. Small bilateral lower pole nonobstructing renal calculi appears similar to previous exam. No mass or hydronephrosis identified bilaterally. Urinary bladder appears normal. Stomach/Bowel: Small hiatal hernia. Stable postoperative changes involving the stomach. No findings to suggest recurrent mass. The small bowel loops have a normal caliber. No findings to suggest bowel obstruction. Normal appearance of the colon. Vascular/Lymphatic: Aortic atherosclerosis. No aneurysm. No abdominal adenopathy. No pelvic or inguinal adenopathy identified. Reproductive: Status post hysterectomy. No adnexal masses. Other: No free fluid or fluid collections. Musculoskeletal: No aggressive lytic or sclerotic bone lesions. IMPRESSION: 1. Stable exam. The left axillary lymph nodes described on previous exam appear stable to decreased in size in the interval. 2. No evidence of metastatic disease within the chest, abdomen or pelvis. 3. Bilateral kidney stones 4.  Aortic Atherosclerosis (ICD10-I70.0). 5. Multi vessel coronary artery atherosclerotic calcifications. Electronically Signed   By: Kerby Moors M.D.   On: 08/25/2018 13:50   Mm 3d Screen Breast Uni Left  Result Date: 09/06/2018 CLINICAL DATA:  Screening. History of treated right breast cancer, status post mastectomy in 1999. EXAM: DIGITAL SCREENING UNILATERAL LEFT MAMMOGRAM WITH CAD AND TOMO COMPARISON:  Previous exam(s). ACR Breast Density Category b: There are scattered areas of fibroglandular density. FINDINGS: The patient has had a right mastectomy. There are no findings suspicious for  malignancy. Images were processed with CAD. IMPRESSION: No mammographic evidence of malignancy. A result letter of this screening mammogram will be mailed directly to the patient. RECOMMENDATION: Screening mammogram in one year.  (Code:[id number]) BI-RADS CATEGORY  1: Negative. Electronically Signed   By: Fidela Salisbury M.D.   On: 09/06/2018 18:42    Assessment and Plan:   Rachael Castro is a 84 y.o. y/o female with history of GIST here for follow-up  Patient denies any further loose stools Infectious work-up in September 2019 with stool studies was negative  Continue follow-up with Dr. Mike Gip in regard to her history of gist and patient is being followed up with repeat imaging.  See my last note about my conversation with Dr. Mike Gip and she states "no I do not think we need direct visualization".  She is otherwise asymptomatic  In regard to her intermittent fecal smearing, this may be occurring due to pelvic floor muscle weakness that can occur in the elderly Denies any urinary incontinence or lower extremity weakness, therefore no alarm symptoms, no symptoms to suggest cauda equina syndrome.  I have discussed pelvic floor physical therapy referral and patient does not want this at this time I have asked her to avoid dairy products at bedtime as these seem to be related to  her symptoms  I have offered her a colonoscopy to rule out any colonic lesions the patient is refusing at this time Benefits and risks of the procedure were discussed in detail and she verbalized understanding  Dr Rachael Castro

## 2018-09-21 NOTE — Progress Notes (Signed)
Subjective:   Rachael Castro is a 84 y.o. female who presents for Medicare Annual (Subsequent) preventive examination.  Review of Systems:   Cardiac Risk Factors include: advanced age (>69men, >55 women);diabetes mellitus;dyslipidemia;hypertension     Objective:     Vitals: BP (!) 142/62 (BP Location: Right Arm, Patient Position: Sitting, Cuff Size: Normal)   Pulse 88   Temp 97.8 F (36.6 C) (Oral)   Resp 16   Ht 5\' 4"  (1.626 m)   Wt 125 lb 14.4 oz (57.1 kg)   SpO2 97%   BMI 21.61 kg/m   Body mass index is 21.61 kg/m.  Advanced Directives 09/21/2018 09/01/2018 08/07/2017 08/05/2017 04/07/2017 02/04/2017 12/05/2016  Does Patient Have a Medical Advance Directive? No No No No No No No  Would patient like information on creating a medical advance directive? Yes (MAU/Ambulatory/Procedural Areas - Information given) No - Patient declined - No - Patient declined - - -  Pre-existing out of facility DNR order (yellow form or pink MOST form) - - - - - - -    Tobacco Social History   Tobacco Use  Smoking Status Never Smoker  Smokeless Tobacco Never Used     Counseling given: Not Answered   Clinical Intake:  Pre-visit preparation completed: Yes  Pain : 0-10 Pain Score: 4  Pain Type: Neuropathic pain Pain Location: Neck Pain Descriptors / Indicators: Sharp Pain Onset: More than a month ago Pain Frequency: Intermittent     Nutritional Status: BMI of 19-24  Normal Nutritional Risks: None Diabetes: Yes CBG done?: No Did pt. bring in CBG monitor from home?: No   Nutrition Risk Assessment:  Has the patient had any N/V/D within the last 2 months?  Yes diarrhea Does the patient have any non-healing wounds?  No  Has the patient had any unintentional weight loss or weight gain?  No   Diabetes:  Is the patient diabetic?  Yes  If diabetic, was a CBG obtained today?  No  Did the patient bring in their glucometer from home?  No  How often do you monitor your CBG's? As  needed.   Financial Strains and Diabetes Management:  Are you having any financial strains with the device, your supplies or your medication? No .  Does the patient want to be seen by Chronic Care Management for management of their diabetes?  No  Would the patient like to be referred to a Nutritionist or for Diabetic Management?  No   Diabetic Exams:  Diabetic Eye Exam: Completed 09/16/17 negative retinopathy. Overdue for diabetic eye exam. Pt has been advised about the importance in completing this exam. Pt aware to call [facility] to schedule appt.   Diabetic Foot Exam: Completed 06/09/18.   How often do you need to have someone help you when you read instructions, pamphlets, or other written materials from your doctor or pharmacy?: 1 - Never What is the last grade level you completed in school?: 12th grade  Interpreter Needed?: No  Information entered by :: Clemetine Marker LPN  Past Medical History:  Diagnosis Date  . Breast cancer ([place]) 1999   right breast  . Diabetes mellitus   . GERD (gastroesophageal reflux disease)   . GIST (gastrointestinal stromal tumor), malignant ([place]) 2013  . History of shingles   . Hypercholesteremia   . Hypertension   . Kidney stones   . 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 Osteopenia 12/24/2017   March 2019;  next scan March 2021  . Personal history of chemotherapy   . Personal history of colonic polyps   . Thyroid disease    Past Surgical History:  Procedure Laterality Date  . ABDOMINAL HYSTERECTOMY    . APPENDECTOMY    . BREAST BIOPSY Left 09/08/2016   Done by Dr. Jamal Collin  . BREAST EXCISIONAL BIOPSY Right 1999   Mastectomy  . BREAST SURGERY Right 1999   mastectomy  . CATARACT EXTRACTION W/PHACO Left 04/30/2015   Procedure: CATARACT EXTRACTION PHACO AND INTRAOCULAR LENS PLACEMENT (IOC);  Surgeon: Estill Cotta, MD;  Location: [facility] ORS;  Service: Ophthalmology;  Laterality: Left;  [place] 01:39 AP%  23.3 CDE 40.42 fluid pack lot # [id number]  . COLONOSCOPY  2008   Sankar  . EUS  02/12/2012   Procedure: UPPER ENDOSCOPIC ULTRASOUND (EUS) LINEAR;  Surgeon: Milus Banister, MD;  Location: [facility];  Service: Endoscopy;  Laterality: N/A;  radial linear  . EYE SURGERY Right 2013   cataract  . HERNIA REPAIR  2012  . LAPAROTOMY  2013   excision of gastric wall mass   . MASTECTOMY    . SALPINGOOPHORECTOMY Right 1979  . THYROIDECTOMY  1978  . TONSILLECTOMY    . TUBAL LIGATION    . UPPER GI ENDOSCOPY  2013   Family History  Problem Relation Age of Onset  . Cancer Mother        cervical  . Cancer Father        prostate  . Kidney Stones Brother   . Glaucoma Brother   . Prostatitis Brother   . Diabetes Sister   . Thyroid disease Brother   . Cancer Brother        thyroid  . Arthritis Brother   . Diabetes Sister   . Kidney disease Neg Hx   . Bladder Cancer Neg Hx   . Breast cancer Neg Hx    Social History   Socioeconomic History  . Marital status: Married    Spouse name: Not on file  . Number of children: 1  . Years of education: Not on file  . Highest education level: High school graduate  Occupational History  . Not on file  Social Needs  . Financial resource strain: Not very hard  . Food insecurity:    Worry: Never true    Inability: Never true  . Transportation needs:    Medical: No    Non-medical: No  Tobacco Use  . Smoking status: Never Smoker  . Smokeless tobacco: Never Used  Substance and Sexual Activity  . Alcohol use: No  . Drug use: No  . Sexual activity: Yes  Lifestyle  . Physical activity:    Days per week: 2 days    Minutes per session: 10 min  . Stress: Not at all  Relationships  . Social connections:    Talks on phone: More than three times a week    Gets together: More than three times a week    Attends religious service: More than 4 times per year    Active member of club or organization: No    Attends meetings of clubs or  organizations: Never    Relationship status: Married  Other Topics Concern  . Not on file  Social History Narrative  . Not on file    Outpatient Encounter Medications as of 09/21/2018  Medication Sig  . atorvastatin (LIPITOR) 40 MG tablet Take 1.5 tablets (60 mg total) by mouth at bedtime.  . calcium citrate-vitamin  D 200-200 MG-UNIT TABS Take 1 tablet by mouth daily.  Marland Kitchen losartan (COZAAR) 25 MG tablet TAKE 1 TABLET BY MOUTH EVERY DAY  . Multiple Vitamin (MULTIVITAMIN) tablet Take 1 tablet by mouth daily.  Marland Kitchen omeprazole (PRILOSEC) 20 MG capsule TAKE 1 CAPSULE BY MOUTH EVERY DAY  . sitaGLIPtin-metformin (JANUMET) 50-500 MG tablet Take 1 tablet by mouth 2 (two) times daily with a meal.  . thiamine (VITAMIN B-1) 100 MG tablet Take 100 mg by mouth daily.  Marland Kitchen triamcinolone cream (KENALOG) 0.1 % Apply 1 application topically 2 (two) times daily. (for the legs)   No facility-administered encounter medications on file as of 09/21/2018.     Activities of Daily Living In your present state of health, do you have any difficulty performing the following activities: 09/21/2018 06/09/2018  Hearing? N N  Comment declines hearing aids -  Vision? N N  Comment wears glasses -  Difficulty concentrating or making decisions? N N  Walking or climbing stairs? N N  Dressing or bathing? N N  Doing errands, shopping? N N  Preparing Food and eating ? N -  Using the Toilet? N -  In the past six months, have you accidently leaked urine? N -  Do you have problems with loss of bowel control? Y -  Comment pt has hx of GIST removal and now has occasional diarrhea wth certain foods, pt discussed with GI and was advised to exercise to help tighten pelvic muscles -  Managing your Medications? N -  Managing your Finances? N -  Housekeeping or managing your Housekeeping? N -  Some recent data might be hidden    Patient Care Team: Lada, Satira Anis, MD as PCP - General (Family Medicine) Lada, Satira Anis, MD as Attending  Physician (Family Medicine) Lequita Asal, MD as Referring Physician (Hematology and Oncology) Deboraha Sprang, MD as Consulting Physician (Cardiology) Laneta Simmers as Physician Assistant (Urology) Bary Castilla, Forest Gleason, MD (General Surgery) Virgel Manifold, MD as Consulting Physician (Gastroenterology)    Assessment:   This is a routine wellness examination for Wallace.  Exercise Activities and Dietary recommendations Current Exercise Habits: Home exercise routine, Type of exercise: walking, Time (Minutes): 20, Frequency (Times/Week): 7, Weekly Exercise (Minutes/Week): 140, Intensity: Mild, Exercise limited by: None identified  Goals    . Increase physical activity     Increase physical activity to 90 minutes per week.    . Prevent falls       Fall Risk Fall Risk  09/21/2018 09/14/2018 06/09/2018 12/10/2017 09/21/2017  Falls in the past year? 0 0 No No No  Number falls in past yr: 0 0 - - -  Injury with Fall? - 0 - - -  Risk for fall due to : - - - - -  Follow up - - - - -  Comment - - - - -   [place]:  Any stairs in or around the home WITH handrails? No  Home free of loose throw rugs in walkways, pet beds, electrical cords, etc? Yes  Adequate lighting in your home to reduce risk of falls? Yes   ASSISTIVE DEVICES UTILIZED TO PREVENT FALLS:  Life alert? No  Use of a cane, walker or w/c? No  Grab bars in the bathroom? Yes  Shower chair or bench in shower? Yes  Elevated toilet seat or a handicapped toilet? Yes   DME ORDERS:  DME order needed?  No   TIMED UP  AND GO:  Was the test performed? Yes .  Length of time to ambulate 10 feet: 5 sec.   GAIT:  Appearance of gait: Gait stead-fast and without the use of an assistive device.  Education: Fall risk prevention has been discussed.  Intervention(s) required? No   Depression Screen PHQ 2/9 Scores 09/21/2018 06/09/2018 09/21/2017 08/07/2017  PHQ - 2 Score 0 0 0 0    PHQ- 9 Score 0 - - -  Exception Documentation - - - -     Cognitive Function     6CIT Screen 09/21/2018 09/21/2017  What Year? 0 points 0 points  What month? 0 points 0 points  What time? 0 points 0 points  Count back from 20 0 points 0 points  Months in reverse 0 points 0 points  Repeat phrase 2 points 2 points  Total Score 2 2    Immunization History  Administered Date(s) Administered  . Influenza, High Dose Seasonal PF 06/18/2015, 09/04/2016  . Influenza-Unspecified 07/23/2017, 06/02/2018    Qualifies for Shingles Vaccine? Yes . Per pt she has completed part one of Shingrix vaccine at [place] and needs second dose. Will contact pharmacy for records.   Tdap: Up to date  Flu Vaccine: Up to date   Pneumococcal Vaccine: Up to date   Screening Tests Health Maintenance  Topic Date Due  . OPHTHALMOLOGY EXAM  09/16/2018  . COLONOSCOPY  09/20/2019 (Originally 07/13/2018)  . HEMOGLOBIN A1C  12/10/2018  . FOOT EXAM  06/10/2019  . MAMMOGRAM  09/07/2019  . DEXA SCAN  12/25/2019  . TETANUS/TDAP  04/26/2022  . INFLUENZA VACCINE  Completed  . PNA vac Low Risk Adult  Completed    Cancer Screenings:  Colorectal Screening: Completed 07/13/13. Repeat every 5 years; Pt had appt with GI today. They plan to continue to monitor her and do not recommend colonoscopy at this time due to age. Pt is doing well since GIST removal but still being followed with GI.  Mammogram: Completed 09/06/18. Repeat every year;   Bone Density: Completed 12/24/17. Results reflect OSTEOPENIA,  Repeat every 2 years. .   Lung Cancer Screening: (Low Dose CT Chest recommended if Age 22-80 years, 30 pack-year currently smoking OR have quit w/in 15years.) does not qualify.    Additional Screening:  Hepatitis C Screening: no longer required  Vision Screening: Recommended annual ophthalmology exams for early detection of glaucoma and other disorders of the eye. Is the patient up to date with their  annual eye exam?  No  Who is the provider or what is the name of the office in which the pt attends annual eye exams? Dr. Thomasene Ripple [facility]. Pt aware to schedule appt. Last exam 09/16/17 negative retinopathy  Dental Screening: Recommended annual dental exams for proper oral hygiene  Community Resource Referral:  CRR required this visit?  No      Plan:    I have personally reviewed and addressed the Medicare Annual Wellness questionnaire and have noted the following in the patient's chart:  A. Medical and social history B. Use of alcohol, tobacco or illicit drugs  C. Current medications and supplements D. Functional ability and status E.  Nutritional status F.  Physical activity G. Advance directives H. List of other physicians I.  Hospitalizations, surgeries, and ER visits in previous 12 months J.  [place] such as hearing and vision if needed, cognitive and depression L. Referrals and appointments   In addition, I have reviewed and discussed with  patient certain preventive protocols, quality metrics, and best practice recommendations. A written personalized care plan for preventive services as well as general preventive health recommendations were provided to patient.   Signed,  Clemetine Marker, LPN Nurse Health Advisor   Nurse Notes: Pt doing well. Her only complaint today is occasional diarrhea when she eats certain foods. She has discussed this with GI (appt earlier today) and stated she does not have control issues with her bowels but will occasionally wake up "with a stripe" in the morning which bothers her but GI advised to increase exercise to strengthen pelvic muscles.

## 2018-09-21 NOTE — Patient Instructions (Addendum)
Rachael Castro , Thank you for taking time to come for your Medicare Wellness Visit. I appreciate your ongoing commitment to your health goals. Please review the following plan we discussed and let me know if I can assist you in the future.   Screening recommendations/referrals: Colonoscopy: continue follow up with gastroenterology for their recommendations Mammogram: done 09/06/18 repeat in one year Bone Density: 12/24/17 Recommended yearly ophthalmology/optometry visit for glaucoma screening and checkup Recommended yearly dental visit for hygiene and checkup  Vaccinations: Influenza vaccine: done 06/02/18 Pneumococcal vaccine: done 11/24/13 Tdap vaccine: done 04/26/12 Shingles vaccine: Contact [facility] for second dose information and please have them fax immunization record to our office.     Advanced directives: Advance directive discussed with you today. I have provided a copy for you to complete at home and have notarized. Once this is complete please bring a copy in to our office so we can scan it into your chart.  Conditions/risks identified: Recommend increasing physical activity to 90 minutes per week.   Next appointment: 12/10/2018 Dr. Sanda Klein 10:00   Preventive Care 19 Years and Older, Female Preventive care refers to lifestyle choices and visits with your health care provider that can promote health and wellness. What does preventive care include?  A yearly physical exam. This is also called an annual well check.  Dental exams once or twice a year.  Routine eye exams. Ask your health care provider how often you should have your eyes checked.  Personal lifestyle choices, including:  Daily care of your teeth and gums.  Regular physical activity.  Eating a healthy diet.  Avoiding tobacco and drug use.  Limiting alcohol use.  Practicing safe sex.  Taking low-dose aspirin every day.  Taking vitamin and mineral supplements as recommended by your health care provider. What  happens during an annual well check? The services and screenings done by your health care provider during your annual well check will depend on your age, overall health, lifestyle risk factors, and family history of disease. Counseling  Your health care provider may ask you questions about your:  Alcohol use.  Tobacco use.  Drug use.  Emotional well-being.  Home and relationship well-being.  Sexual activity.  Eating habits.  History of falls.  Memory and ability to understand (cognition).  Work and work Statistician.  Reproductive health. Screening  You may have the following tests or measurements:  Height, weight, and BMI.  Blood pressure.  Lipid and cholesterol levels. These may be checked every 5 years, or more frequently if you are over 60 years old.  Skin check.  Lung cancer screening. You may have this screening every year starting at age 62 if you have a 30-pack-year history of smoking and currently smoke or have quit within the past 15 years.  Fecal occult blood test (FOBT) of the stool. You may have this test every year starting at age 15.  Flexible sigmoidoscopy or colonoscopy. You may have a sigmoidoscopy every 5 years or a colonoscopy every 10 years starting at age 82.  Hepatitis C blood test.  Hepatitis B blood test.  Sexually transmitted disease (STD) testing.  Diabetes screening. This is done by checking your blood sugar (glucose) after you have not eaten for a while (fasting). You may have this done every 1-3 years.  Bone density scan. This is done to screen for osteoporosis. You may have this done starting at age 44.  Mammogram. This may be done every 1-2 years. Talk to your health care  provider about how often you should have regular mammograms. Talk with your health care provider about your test results, treatment options, and if necessary, the need for more tests. Vaccines  Your health care provider may recommend certain vaccines, such  as:  Influenza vaccine. This is recommended every year.  Tetanus, diphtheria, and acellular pertussis (Tdap, Td) vaccine. You may need a Td booster every 10 years.  Zoster vaccine. You may need this after age 45.  Pneumococcal 13-valent conjugate (PCV13) vaccine. One dose is recommended after age 64.  Pneumococcal polysaccharide (PPSV23) vaccine. One dose is recommended after age 24. Talk to your health care provider about which screenings and vaccines you need and how often you need them. This information is not intended to replace advice given to you by your health care provider. Make sure you discuss any questions you have with your health care provider. Document Released: 11/02/2015 Document Revised: 06/25/2016 Document Reviewed: 08/07/2015 Elsevier Interactive Patient Education  2017 [place] Prevention in the Home Falls can cause injuries. They can happen to people of all ages. There are many things you can do to make your home safe and to help prevent falls. What can I do on the outside of my home?  Regularly fix the edges of walkways and driveways and fix any cracks.  Remove anything that might make you trip as you walk through a door, such as a raised step or threshold.  Trim any bushes or trees on the path to your home.  Use bright outdoor lighting.  Clear any walking paths of anything that might make someone trip, such as rocks or tools.  Regularly check to see if handrails are loose or broken. Make sure that both sides of any steps have handrails.  Any raised decks and porches should have guardrails on the edges.  Have any leaves, snow, or ice cleared regularly.  Use sand or salt on walking paths during winter.  Clean up any spills in your garage right away. This includes oil or grease spills. What can I do in the bathroom?  Use night lights.  Install grab bars by the toilet and in the tub and shower. Do not use towel bars as grab bars.  Use  non-skid mats or decals in the tub or shower.  If you need to sit down in the shower, use a plastic, non-slip stool.  Keep the floor dry. Clean up any water that spills on the floor as soon as it happens.  Remove soap buildup in the tub or shower regularly.  Attach bath mats securely with double-sided non-slip rug tape.  Do not have throw rugs and other things on the floor that can make you trip. What can I do in the bedroom?  Use night lights.  Make sure that you have a light by your bed that is easy to reach.  Do not use any sheets or blankets that are too big for your bed. They should not hang down onto the floor.  Have a firm chair that has side arms. You can use this for support while you get dressed.  Do not have throw rugs and other things on the floor that can make you trip. What can I do in the kitchen?  Clean up any spills right away.  Avoid walking on wet floors.  Keep items that you use a lot in easy-to-reach places.  If you need to reach something above you, use a strong step stool that has a grab bar.  Keep electrical cords out of the way.  Do not use floor polish or wax that makes floors slippery. If you must use wax, use non-skid floor wax.  Do not have throw rugs and other things on the floor that can make you trip. What can I do with my stairs?  Do not leave any items on the stairs.  Make sure that there are handrails on both sides of the stairs and use them. Fix handrails that are broken or loose. Make sure that handrails are as long as the stairways.  Check any carpeting to make sure that it is firmly attached to the stairs. Fix any carpet that is loose or worn.  Avoid having throw rugs at the top or bottom of the stairs. If you do have throw rugs, attach them to the floor with carpet tape.  Make sure that you have a light switch at the top of the stairs and the bottom of the stairs. If you do not have them, ask someone to add them for you. What  else can I do to help prevent falls?  Wear shoes that:  Do not have high heels.  Have rubber bottoms.  Are comfortable and fit you well.  Are closed at the toe. Do not wear sandals.  If you use a stepladder:  Make sure that it is fully opened. Do not climb a closed stepladder.  Make sure that both sides of the stepladder are locked into place.  Ask someone to hold it for you, if possible.  Clearly mark and make sure that you can see:  Any grab bars or handrails.  First and last steps.  Where the edge of each step is.  Use tools that help you move around (mobility aids) if they are needed. These include:  Canes.  Walkers.  Scooters.  Crutches.  Turn on the lights when you go into a dark area. Replace any light bulbs as soon as they burn out.  Set up your furniture so you have a clear path. Avoid moving your furniture around.  If any of your floors are uneven, fix them.  If there are any pets around you, be aware of where they are.  Review your medicines with your doctor. Some medicines can make you feel dizzy. This can increase your chance of falling. Ask your doctor what other things that you can do to help prevent falls. This information is not intended to replace advice given to you by your health care provider. Make sure you discuss any questions you have with your health care provider. Document Released: 08/02/2009 Document Revised: 03/13/2016 Document Reviewed: 11/10/2014 Elsevier Interactive Patient Education  2017 [employer].

## 2018-12-10 ENCOUNTER — Ambulatory Visit: Payer: Medicare Other | Admitting: Family Medicine

## 2018-12-10 ENCOUNTER — Encounter: Payer: Self-pay | Admitting: Family Medicine

## 2018-12-10 VITALS — BP 122/62 | HR 83 | Temp 98.3°F | Resp 12 | Ht 64.0 in | Wt 120.6 lb

## 2018-12-10 DIAGNOSIS — M85852 Other specified disorders of bone density and structure, left thigh: Secondary | ICD-10-CM

## 2018-12-10 DIAGNOSIS — C49A0 Gastrointestinal stromal tumor, unspecified site: Secondary | ICD-10-CM | POA: Diagnosis not present

## 2018-12-10 DIAGNOSIS — I10 Essential (primary) hypertension: Secondary | ICD-10-CM | POA: Diagnosis not present

## 2018-12-10 DIAGNOSIS — E119 Type 2 diabetes mellitus without complications: Secondary | ICD-10-CM

## 2018-12-10 DIAGNOSIS — Z5181 Encounter for therapeutic drug level monitoring: Secondary | ICD-10-CM

## 2018-12-10 DIAGNOSIS — E782 Mixed hyperlipidemia: Secondary | ICD-10-CM

## 2018-12-10 NOTE — Patient Instructions (Signed)
Try to follow the DASH guidelines (DASH stands for Dietary Approaches to Stop Hypertension). Try to limit the sodium in your diet to no more than 1,500mg  of sodium per day. Certainly try to not exceed 2,000 mg per day at the very most. Do not add salt when cooking or at the table.  Check the sodium amount on labels when shopping, and choose items lower in sodium when given a choice. Avoid or limit foods that already contain a lot of sodium. Eat a diet rich in fruits and vegetables and whole grains, and try to lose weight if overweight or obese  Try to limit saturated fats in your diet (bologna, hot dogs, barbeque, cheeseburgers, hamburgers, steak, bacon, sausage, cheese, etc.) and get more fresh fruits, vegetables, and whole grains  Please do see your eye doctor regularly, and have your eyes examined every year (or more often per his or her recommendation) Check your feet every night and let me know right away of any sores, infections, numbness, etc. Try to limit sweets, white bread, white rice, white potatoes It is okay with me for you to not check your fingerstick blood sugars unless you are interested and feel it would be helpful for you

## 2018-12-10 NOTE — Assessment & Plan Note (Signed)
Next scan in March 2021; try to practice good fall precautions; calcium intake; check vit D today

## 2018-12-10 NOTE — Progress Notes (Signed)
BP 122/62   Pulse 83   Temp 98.3 F (36.8 C) (Oral)   Resp 12   Ht 5\' 4"  (1.626 m)   Wt 120 lb 9.6 oz (54.7 kg)   SpO2 95%   BMI 20.70 kg/m    Subjective:    Patient ID: Rachael Castro, female    DOB: [birth year], 85 y.o.   MRN: [id number]  HPI: Rachael Castro is a 85 y.o. female  Chief Complaint  Patient presents with  . Follow-up    HPI Patient is here for follow-up  Type 2 diabetes; checking sugars at random, highest 190 after milkshake; no lows No new problems with feet; due for eye exam and appt is scheduled for March Trying to follow pretty healthy diet, occasionally stray; trying to limit sweets and starches  Lab Results  Component Value Date   HGBA1C 6.6 (H) 06/09/2018  urine microalbumin:Cr was normal in August  Hypertension; BP today is improved over previous readings; just adding a little bit of salt when cooking, none at the table (cautioned that that salt is still in the food); no NSAIDs; just tylenol  BP Readings from Last 3 Encounters:  12/10/18 122/62  09/21/18 (!) 142/62  09/21/18 (!) 165/67   High cholesterol; on statin; not many eggs at all lately; no steak; occasional roast, occasional meatloaf; not many fried foods; does eat bacon once in a while or sausage; loves Honey Nut Cheerios  Lab Results  Component Value Date   CHOL 173 06/09/2018   HDL 47 (L) 06/09/2018   LDLCALC 98 06/09/2018   TRIG 186 (H) 06/09/2018   CHOLHDL 3.7 06/09/2018    GIST; goes to see oncologist in May; no blood in the stool; on PPI  Osteopenia; last DEXA March 2019, T-score -1.8; working out in the yard, cut down a grapevine; active; lots of greens  She had to go to urgent care a few weeks ago; had congestion, not the flu per the test; feeling better now, "Oh, yeah"  Depression screen [facility] 2/9 12/10/2018 09/21/2018 06/09/2018 09/21/2017 08/07/2017  Decreased Interest 0 0 0 0 0  Down, Depressed, Hopeless 0 0 0 0 0  PHQ - 2 Score 0 0 0 0 0  Altered sleeping 0 0  - - -  Tired, decreased energy 0 0 - - -  Change in appetite 0 0 - - -  Feeling bad or failure about yourself  0 0 - - -  Trouble concentrating 0 0 - - -  Moving slowly or fidgety/restless 0 0 - - -  Suicidal thoughts 0 0 - - -  PHQ-9 Score 0 0 - - -  Difficult doing work/chores Not difficult at all - - - -   Fall Risk  12/10/2018 09/21/2018 09/14/2018 06/09/2018 12/10/2017  Falls in the past year? 0 0 0 No No  Number falls in past yr: 0 0 0 - -  Injury with Fall? 0 - 0 - -  Risk for fall due to : - - - - -  Follow up - - - - -  Comment - - - - -    Relevant past medical, surgical, family and social history reviewed Past Medical History:  Diagnosis Date  . Breast cancer ([place]) 1999   right breast  . Diabetes mellitus   . GERD (gastroesophageal reflux disease)   . GIST (gastrointestinal stromal tumor), malignant ([place]) 2013  . History of shingles   . Hypercholesteremia   . Hypertension   .  Kidney stones   . 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 Osteopenia 12/24/2017   March 2019; next scan March 2021  . Personal history of chemotherapy   . Personal history of colonic polyps   . Thyroid disease    Past Surgical History:  Procedure Laterality Date  . ABDOMINAL HYSTERECTOMY    . APPENDECTOMY    . BREAST BIOPSY Left 09/08/2016   Done by Dr. Jamal Collin  . BREAST EXCISIONAL BIOPSY Right 1999   Mastectomy  . BREAST SURGERY Right 1999   mastectomy  . CATARACT EXTRACTION W/PHACO Left 04/30/2015   Procedure: CATARACT EXTRACTION PHACO AND INTRAOCULAR LENS PLACEMENT (IOC);  Surgeon: Estill Cotta, MD;  Location: [facility] ORS;  Service: Ophthalmology;  Laterality: Left;  [place] 01:39 AP% 23.3 CDE 40.42 fluid pack lot # [id number]  . COLONOSCOPY  2008   Sankar  . EUS  02/12/2012   Procedure: UPPER ENDOSCOPIC ULTRASOUND (EUS) LINEAR;  Surgeon: Milus Banister, MD;  Location: [facility];  Service: Endoscopy;  Laterality: N/A;  radial linear  . EYE SURGERY Right  2013   cataract  . HERNIA REPAIR  2012  . LAPAROTOMY  2013   excision of gastric wall mass   . MASTECTOMY    . SALPINGOOPHORECTOMY Right 1979  . THYROIDECTOMY  1978  . TONSILLECTOMY    . TUBAL LIGATION    . UPPER GI ENDOSCOPY  2013   Family History  Problem Relation Age of Onset  . Cancer Mother        cervical  . Cancer Father        prostate  . Kidney Stones Brother   . Glaucoma Brother   . Prostatitis Brother   . Diabetes Sister   . Thyroid disease Brother   . Cancer Brother        thyroid  . Arthritis Brother   . Diabetes Sister   . Kidney disease Neg Hx   . Bladder Cancer Neg Hx   . Breast cancer Neg Hx    Social History   Tobacco Use  . Smoking status: Never Smoker  . Smokeless tobacco: Never Used  Substance Use Topics  . Alcohol use: No  . Drug use: No     [facility] Visit from 12/10/2018 in [facility]  AUDIT-C Score  0      Interim medical history since last visit reviewed. Allergies and medications reviewed  Review of Systems Per HPI unless specifically indicated above     Objective:    BP 122/62   Pulse 83   Temp 98.3 F (36.8 C) (Oral)   Resp 12   Ht 5\' 4"  (1.626 m)   Wt 120 lb 9.6 oz (54.7 kg)   SpO2 95%   BMI 20.70 kg/m   Wt Readings from Last 3 Encounters:  12/10/18 120 lb 9.6 oz (54.7 kg)  09/21/18 125 lb 14.4 oz (57.1 kg)  09/21/18 126 lb 9.6 oz (57.4 kg)    Physical Exam Constitutional:      General: She is not in acute distress.    Appearance: She is well-developed. She is not diaphoretic.  HENT:     Head: Normocephalic and atraumatic.  Eyes:     General: No scleral icterus. Neck:     Thyroid: No thyromegaly.  Cardiovascular:     Rate and Rhythm: Normal rate and regular rhythm.     Heart sounds: Normal heart sounds. No murmur.  Pulmonary:  Effort: Pulmonary effort is normal. No respiratory distress.     Breath sounds: Normal breath sounds. No wheezing.  Abdominal:     General: Bowel sounds are  normal. There is no distension.     Palpations: Abdomen is soft.  Skin:    General: Skin is warm and dry.     Coloration: Skin is not pale.  Neurological:     Mental Status: She is alert.  Psychiatric:        Behavior: Behavior normal.        Thought Content: Thought content normal.        Judgment: Judgment normal.    Diabetic Foot Form - Detailed   Diabetic Foot Exam - detailed Diabetic Foot exam was performed with the following findings:  Yes 12/10/2018 10:21 AM  Visual Foot Exam completed.:  Yes  Pulse Foot Exam completed.:  Yes  Right Dorsalis Pedis:  Present Left Dorsalis Pedis:  Present  Sensory Foot Exam Completed.:  Yes Semmes-Weinstein Monofilament Test R Site 1-Great Toe:  Pos L Site 1-Great Toe:  Pos    Comments:  Callus on medial left great toe; overall foot look good without dry skin or ulcers      Results for orders placed or performed in visit on 08/25/18  Cancer antigen 27.29  Result Value Ref Range   CA 27.29 50.4 (H) 0.0 - 38.6 U/mL  Comprehensive metabolic panel  Result Value Ref Range   Sodium 141 135 - 145 mmol/L   Potassium 4.2 3.5 - 5.1 mmol/L   Chloride 103 98 - 111 mmol/L   CO2 28 22 - 32 mmol/L   Glucose, Bld 129 (H) 70 - 99 mg/dL   BUN 13 8 - 23 mg/dL   Creatinine, Ser 0.72 0.44 - 1.00 mg/dL   Calcium 9.7 8.9 - 10.3 mg/dL   Total Protein 7.7 6.5 - 8.1 g/dL   Albumin 4.2 3.5 - 5.0 g/dL   AST 23 15 - 41 U/L   ALT 18 0 - 44 U/L   Alkaline Phosphatase 63 38 - 126 U/L   Total Bilirubin 0.8 0.3 - 1.2 mg/dL   GFR calc non Af Amer >60 >60 mL/min   GFR calc Af Amer >60 >60 mL/min   Anion gap 10 5 - 15  CBC with Differential  Result Value Ref Range   WBC 6.4 4.0 - 10.5 K/uL   RBC 3.69 (L) 3.87 - 5.11 MIL/uL   Hemoglobin 11.7 (L) 12.0 - 15.0 g/dL   HCT 35.9 (L) 36.0 - 46.0 %   MCV 97.3 80.0 - 100.0 fL   MCH 31.7 26.0 - 34.0 pg   MCHC 32.6 30.0 - 36.0 g/dL   RDW 13.2 11.5 - 15.5 %   Platelets 234 150 - 400 K/uL   nRBC 0.0 0.0 - 0.2 %    Neutrophils Relative % 59 %   Neutro Abs 3.8 1.7 - 7.7 K/uL   Lymphocytes Relative 31 %   Lymphs Abs 2.0 0.7 - 4.0 K/uL   Monocytes Relative 7 %   Monocytes Absolute 0.5 0.1 - 1.0 K/uL   Eosinophils Relative 2 %   Eosinophils Absolute 0.1 0.0 - 0.5 K/uL   Basophils Relative 1 %   Basophils Absolute 0.0 0.0 - 0.1 K/uL   Immature Granulocytes 0 %   Abs Immature Granulocytes 0.01 0.00 - 0.07 K/uL      Assessment & Plan:   Problem List Items Addressed This Visit      Cardiovascular and  Mediastinum   Hypertension    Improved; avoiding excess salt; check Cr today; continue medicines, avoid NSAIDs        Digestive   Gastrointestinal stromal tumor (GIST) ([facility])    Seeing oncologist; no blood in the stool        Endocrine   Type 2 diabetes mellitus ([place]) - Primary    Foot exam by MD today      Relevant Orders   Hemoglobin A1C     Musculoskeletal and Integument   Osteopenia    Next scan in March 2021; try to practice good fall precautions; calcium intake; check vit D today      Relevant Orders   VITAMIN D 25 Hydroxy (Vit-D Deficiency, Fractures)     Other   Medication monitoring encounter   Relevant Orders   VITAMIN D 25 Hydroxy (Vit-D Deficiency, Fractures)   Hypomagnesemia    Check M2+      Relevant Orders   Magnesium   Hyperlipidemia    Check lipids; limit saturated fats; continue statin      Relevant Orders   Lipid panel       Follow up plan: Return in about 6 months (around 06/10/2019) for follow-up visit with Dr. Sanda Klein (or just after).  An after-visit summary was printed and given to the patient at [place].  Please see the patient instructions which may contain other information and recommendations beyond what is mentioned above in the assessment and plan.  No orders of the defined types were placed in this encounter.   Orders Placed This Encounter  Procedures  . Hemoglobin A1C  . Lipid panel  . VITAMIN D 25 Hydroxy (Vit-D Deficiency, Fractures)   . Magnesium

## 2018-12-10 NOTE — Assessment & Plan Note (Signed)
Foot exam by MD today 

## 2018-12-10 NOTE — Assessment & Plan Note (Signed)
Check lipids; limit saturated fats; continue statin

## 2018-12-10 NOTE — Assessment & Plan Note (Addendum)
Improved; avoiding excess salt; check Cr today; continue medicines, avoid NSAIDs

## 2018-12-10 NOTE — Assessment & Plan Note (Signed)
Check M2+

## 2018-12-10 NOTE — Assessment & Plan Note (Signed)
Seeing oncologist; no blood in the stool

## 2018-12-11 LAB — MAGNESIUM: Magnesium: 1.2 mg/dL — ABNORMAL LOW (ref 1.5–2.5)

## 2018-12-11 LAB — LIPID PANEL
Cholesterol: 183 mg/dL (ref <200)
HDL: 48 mg/dL — ABNORMAL LOW (ref >=50)
LDL CHOLESTEROL (CALC): 109 mg/dL — AB
NON-HDL CHOLESTEROL (CALC): 135 mg/dL — AB (ref <130)
TRIGLYCERIDES: 151 mg/dL — AB (ref <150)
Total CHOL/HDL Ratio: 3.8 (calc) (ref <5.0)

## 2018-12-11 LAB — HEMOGLOBIN A1C
Hgb A1c MFr Bld: 6.5 % of total Hgb — ABNORMAL HIGH (ref <5.7)
Mean Plasma Glucose: 140 (calc)
eAG (mmol/L): 7.7 (calc)

## 2018-12-11 LAB — VITAMIN D 25 HYDROXY (VIT D DEFICIENCY, FRACTURES): VIT D 25 HYDROXY: 38 ng/mL (ref 30–100)

## 2018-12-12 ENCOUNTER — Other Ambulatory Visit: Payer: Self-pay | Admitting: Family Medicine

## 2018-12-12 MED ORDER — ATORVASTATIN CALCIUM 80 MG PO TABS: 80.0000 mg | ORAL_TABLET | Freq: Every day | ORAL | 1 refills | Status: DC

## 2018-12-12 NOTE — Progress Notes (Signed)
Check labs Wed or Thursday Increase statin

## 2018-12-16 ENCOUNTER — Other Ambulatory Visit: Payer: Self-pay

## 2018-12-17 LAB — BASIC METABOLIC PANEL
BUN: 15 mg/dL (ref 7–25)
CO2: 28 mmol/L (ref 20–32)
Calcium: 9.8 mg/dL (ref 8.6–10.4)
Chloride: 102 mmol/L (ref 98–110)
Creat: 0.79 mg/dL (ref 0.60–0.88)
Glucose, Bld: 137 mg/dL — ABNORMAL HIGH (ref 65–99)
Potassium: 3.9 mmol/L (ref 3.5–5.3)
Sodium: 140 mmol/L (ref 135–146)

## 2018-12-17 LAB — MAGNESIUM: MAGNESIUM: 1.5 mg/dL (ref 1.5–2.5)

## 2018-12-23 ENCOUNTER — Other Ambulatory Visit: Payer: Self-pay | Admitting: Family Medicine

## 2018-12-23 NOTE — Telephone Encounter (Signed)
Request received for 90 days; approved

## 2019-01-05 LAB — HM DIABETES EYE EXAM

## 2019-01-21 ENCOUNTER — Other Ambulatory Visit: Payer: Self-pay | Admitting: Family Medicine

## 2019-03-02 ENCOUNTER — Other Ambulatory Visit: Payer: Medicare Other

## 2019-03-02 ENCOUNTER — Ambulatory Visit: Payer: Medicare Other | Admitting: Hematology and Oncology

## 2019-03-16 ENCOUNTER — Other Ambulatory Visit: Payer: Self-pay | Admitting: Family Medicine

## 2019-03-29 ENCOUNTER — Other Ambulatory Visit: Payer: Self-pay

## 2019-03-29 NOTE — Progress Notes (Signed)
[facility]  [address], [address] [place], [place] [address] Phone: [phone]  Fax: [phone]   Clinic Day:  03/30/2019  Referring physician: Arnetha Courser, MD  Chief Complaint: Rachael Castro is a 85 y.o. female with a history of right breast cancer (1999) and gastrointestinal stromal tumor (2013) who is seen for 7 month assessment.  HPI: The patient was last seen in the medical oncology clinic on 09/01/2018. At that time, she was doing well overall.  She complained of having a "crick in the left side of her neck". Patient had chronic cervical neuralgia, which causes her to experience a tingling sensation in the right lower extremity.  Patient was no longer taking gabapentin.  She was followed by pain management in [place].  Exam was grossly unremarkable.  WBC 6400 ([place] 3800). CA27.29 was elevated at 50.4 (previously 44.6).  Left screening mammogram on 09/06/2018 revealed no mammographic evidence of malignancy.  She was seen in Urology on 09/02/2018 by Zara Council, PA-C. No intervention was needed for bilateral nephrolithiasis. Annual follow-up was scheduled.   She was seen by Dr. Hervey Ard, surgery, on 09/14/2018. There was no evidence of recurrence and annual follow-up was recommended.   She was seen by Dr. Bonna Gains, gastroenterology, on 09/21/2018 for GIST. She was asymptomatic. Patient refused a colonoscopy or pelvic floor physical therapy.   She was seen by her PCP, Dr. Enid Derry, on 12/10/2018. She was stable and 6 month follow-up was scheduled.   During the interim, she is doing "good." She complains of left shoulder pain related to her cervical neuralgia. She does monthly breast exams. She notes irritation in her right breast, which she attributes to her bra.  She has occasional shortness of breath when walking long distances. She has occasional tingling in her right leg.  She continues on Calcium and Vitamin D.   Past  Medical History:  Diagnosis Date   Breast cancer ([place]) 1999   right breast   Diabetes mellitus    GERD (gastroesophageal reflux disease)    GIST (gastrointestinal stromal tumor), malignant ([place]) 2013   History of shingles    Hypercholesteremia    Hypertension    Kidney stones    Malignant neoplasm of other specified sites of stomach 2013   GIST   Osteopenia 12/24/2017   March 2019; next scan March 2021   Personal history of chemotherapy    Personal history of colonic polyps    Thyroid disease     Past Surgical History:  Procedure Laterality Date   ABDOMINAL HYSTERECTOMY     APPENDECTOMY     BREAST BIOPSY Left 09/08/2016   Done by Dr. Jamal Collin   BREAST EXCISIONAL BIOPSY Right 1999   Mastectomy   BREAST SURGERY Right 1999   mastectomy   CATARACT EXTRACTION W/PHACO Left 04/30/2015   Procedure: CATARACT EXTRACTION PHACO AND INTRAOCULAR LENS PLACEMENT ([place]);  Surgeon: Estill Cotta, MD;  Location: [facility] ORS;  Service: Ophthalmology;  Laterality: Left;  [place] 01:39 AP% 23.3 CDE 40.42 fluid pack lot # [id number]   COLONOSCOPY  2008   Sankar   EUS  02/12/2012   Procedure: UPPER ENDOSCOPIC ULTRASOUND (EUS) LINEAR;  Surgeon: Milus Banister, MD;  Location: [facility] ENDOSCOPY;  Service: Endoscopy;  Laterality: N/A;  radial linear   EYE SURGERY Right 2013   cataract   HERNIA REPAIR  2012   LAPAROTOMY  2013   excision of gastric wall mass    MASTECTOMY     SALPINGOOPHORECTOMY Right 1979  THYROIDECTOMY  1978   TONSILLECTOMY     TUBAL LIGATION     UPPER GI ENDOSCOPY  2013    Family History  Problem Relation Age of Onset   Cancer Mother        cervical   Cancer Father        prostate   Kidney Stones Brother    Glaucoma Brother    Prostatitis Brother    Diabetes Sister    Thyroid disease Brother    Cancer Brother        thyroid   Arthritis Brother    Diabetes Sister    Kidney disease Neg Hx    Bladder Cancer Neg Hx    Breast cancer Neg  Hx     Social History:  reports that she has never smoked. She has never used smokeless tobacco. She reports that she does not drink alcohol or use drugs. She lives in [place].  The patient is alone today.  Allergies: No Known Allergies  Current Medications: Current Outpatient Medications  Medication Sig Dispense Refill   atorvastatin (LIPITOR) 80 MG tablet TAKE 1 TABLET BY MOUTH EVERYDAY AT BEDTIME 90 tablet 0   calcium citrate-vitamin D 200-200 MG-UNIT TABS Take 1 tablet by mouth daily.     losartan (COZAAR) 25 MG tablet TAKE 1 TABLET BY MOUTH EVERY DAY 90 tablet 1   Multiple Vitamin (MULTIVITAMIN) tablet Take 1 tablet by mouth daily.     omeprazole (PRILOSEC) 20 MG capsule TAKE 1 CAPSULE BY MOUTH EVERY DAY 90 capsule 1   sitaGLIPtin-metformin (JANUMET) 50-500 MG tablet Take 1 tablet by mouth 2 (two) times daily with a meal. 180 tablet 3   thiamine (VITAMIN B-1) 100 MG tablet Take 100 mg by mouth daily.     triamcinolone cream (KENALOG) 0.1 % Apply 1 application topically 2 (two) times daily. (for the legs) 30 g 0   No current facility-administered medications for this visit.     Review of Systems  Constitutional: Positive for weight loss (1 lb). Negative for chills, diaphoresis, fever and malaise/fatigue.       Doing "good." Active.  HENT: Negative.  Negative for congestion, hearing loss, sinus pain and sore throat.   Eyes: Negative.  Negative for blurred vision.  Respiratory: Positive for shortness of breath (exertional with long distances). Negative for cough, hemoptysis and sputum production.   Cardiovascular: Negative for chest pain, palpitations, orthopnea, leg swelling and PND.  Gastrointestinal: Negative for abdominal pain, blood in stool, constipation, diarrhea, melena, nausea and vomiting.  Genitourinary: Negative for dysuria, frequency, hematuria and urgency.       BILATERAL nephrolithiasis; small and nonobstructing  Musculoskeletal: Positive for joint pain  (left shoulder) and neck pain ("crick" in LEFT neck; known cervical neuralgia). Negative for back pain, falls and myalgias.       Irritation in right breast  Skin: Negative for itching and rash.  Neurological: Negative for dizziness, tremors, weakness and headaches. Tingling: RIGHT lower extremity.  Endo/Heme/Allergies: Does not bruise/bleed easily.  Psychiatric/Behavioral: Negative for depression, memory loss and suicidal ideas. The patient is not nervous/anxious and does not have insomnia.   All other systems reviewed and are negative.  Performance status (ECOG): 1  Blood pressure 134/75, pulse 74, temperature 98.5 F (36.9 C), temperature source Tympanic, resp. rate 18, height 5\' 4"  (1.626 m), weight 119 lb 9.6 oz (54.2 kg), SpO2 100 %.   Physical Exam  Constitutional: She is oriented to person, place, and time. She appears well-developed and well-nourished. No  distress.  HENT:  Head: Normocephalic and atraumatic.  Mouth/Throat: Oropharynx is clear and moist. No oropharyngeal exudate.  Curly dark hair.  Mask.  Eyes: Pupils are equal, round, and reactive to light. Conjunctivae and EOM are normal. No scleral icterus.  Glasses. Brown eyes.  Neck: Normal range of motion. Neck supple. No JVD present.  Cardiovascular: Normal rate, regular rhythm and normal heart sounds.  No murmur heard. Pulmonary/Chest: Effort normal and breath sounds normal. No respiratory distress. She has no wheezes. Left breast exhibits inverted nipple and skin change (fibrocystic changes superiorly). Left breast exhibits no mass, no nipple discharge and no tenderness. Breasts are asymmetrical (right mastectomy without erythema or nodularity).  Abdominal: Soft. Bowel sounds are normal. She exhibits no distension and no mass. There is no abdominal tenderness. There is no rebound and no guarding.  Musculoskeletal:        General: No edema.     Left shoulder: She exhibits decreased range of motion and tenderness.    Lymphadenopathy:    She has no cervical adenopathy.    She has no axillary adenopathy.       Right: No supraclavicular adenopathy present.       Left: No supraclavicular adenopathy present.  Neurological: She is alert and oriented to person, place, and time.  Skin: Skin is warm and dry. No rash noted. She is not diaphoretic. No erythema.  Psychiatric: She has a normal mood and affect. Her behavior is normal. Judgment and thought content normal.  Nursing note and vitals reviewed.   Appointment on 03/30/2019  Component Date Value Ref Range Status   Sodium 03/30/2019 142  135 - 145 mmol/L Final   Potassium 03/30/2019 3.9  3.5 - 5.1 mmol/L Final   Chloride 03/30/2019 105  98 - 111 mmol/L Final   CO2 03/30/2019 27  22 - 32 mmol/L Final   Glucose, Bld 03/30/2019 84  70 - 99 mg/dL Final   BUN 03/30/2019 16  8 - 23 mg/dL Final   Creatinine, Ser 03/30/2019 0.77  0.44 - 1.00 mg/dL Final   Calcium 03/30/2019 9.5  8.9 - 10.3 mg/dL Final   Total Protein 03/30/2019 7.7  6.5 - 8.1 g/dL Final   Albumin 03/30/2019 4.0  3.5 - 5.0 g/dL Final   AST 03/30/2019 19  15 - 41 U/L Final   ALT 03/30/2019 13  0 - 44 U/L Final   Alkaline Phosphatase 03/30/2019 55  38 - 126 U/L Final   Total Bilirubin 03/30/2019 0.3  0.3 - 1.2 mg/dL Final   GFR calc non Af Amer 03/30/2019 >60  >60 mL/min Final   GFR calc Af Amer 03/30/2019 >60  >60 mL/min Final   Anion gap 03/30/2019 10  5 - 15 Final   Performed at [facility], [address] [place], [place] [address]   WBC 03/30/2019 8.3  4.0 - 10.5 K/uL Final   RBC 03/30/2019 3.41* 3.87 - 5.11 MIL/uL Final   Hemoglobin 03/30/2019 10.9* 12.0 - 15.0 g/dL Final   HCT 03/30/2019 32.3* 36.0 - 46.0 % Final   MCV 03/30/2019 94.7  80.0 - 100.0 fL Final   MCH 03/30/2019 32.0  26.0 - 34.0 pg Final   MCHC 03/30/2019 33.7  30.0 - 36.0 g/dL Final   RDW 03/30/2019 13.1  11.5 - 15.5 % Final   Platelets 03/30/2019 259  150 - 400 K/uL Final    nRBC 03/30/2019 0.0  0.0 - 0.2 % Final   Neutrophils Relative % 03/30/2019 65  %  Final   Neutro Abs 03/30/2019 5.4  1.7 - 7.7 K/uL Final   Lymphocytes Relative 03/30/2019 27  % Final   Lymphs Abs 03/30/2019 2.3  0.7 - 4.0 K/uL Final   Monocytes Relative 03/30/2019 6  % Final   Monocytes Absolute 03/30/2019 0.5  0.1 - 1.0 K/uL Final   Eosinophils Relative 03/30/2019 1  % Final   Eosinophils Absolute 03/30/2019 0.1  0.0 - 0.5 K/uL Final   Basophils Relative 03/30/2019 0  % Final   Basophils Absolute 03/30/2019 0.0  0.0 - 0.1 K/uL Final   Immature Granulocytes 03/30/2019 1  % Final   Abs Immature Granulocytes 03/30/2019 0.05  0.00 - 0.07 K/uL Final   Performed at [facility], [address] [place], [place] [address]    Assessment:  Rachael Castro is a 85 y.o. female with a history of right breast cancer status post modified radical mastectomy in 1999 followed by 4 cycles of Adriamycin and Cytoxan. She received tamoxifen then extended adjuvant therapy with Femara.    Left sided mammogram on 07/19/2015 revealed no evidence of malignancy.  Left sided mammogram and ultrasound on 09/02/2016 revealed a borderline lymph node in the left axilla, partially behind the lateral edge of the pectoralis muscle.  Left axillary core needle biopsy on 09/08/2016 revealed benign lymphoid hyperplasia.  Left screening mammogram on 09/04/2017 that revealed no mammographic evidence of malignancy.   CA27.29 was 46.6 on 05/30/2015, 41.9 on 07/09/2015, 42.9 on 11/05/2015, 46.2 on 12/05/2015, 53.5 on 06/04/2016, 50.0 on 08/06/2016, 43.7 on 02/04/2017, 39.2 on 08/05/2017, 44.6 on 02/03/2018, 50.4 on 08/25/2018, and 53 on 03/30/2019.  She has a history of gastrointestinal stromal tumor (GIST) status post resection on 02/27/2012. Pathology revealed a 6.4 cm tumor. She received a year of [place] beginning 03/2012. Her dose was reduced secondary to side effects.  Abdominal and pelvis CT scan  on 04/17/2015 revealed postsurgical changes related to the prior gastric resection. There was no evidence of recurrent or metastatic disease. There were multiple nonobstructing bilateral renal calculi measuring up to 2 mm.  Abdominal and pelvic CT scan on 12/03/2015 revealed a stable exam with no evidence of recurrent disease.  There were tiny nonobstructive intrarenal calculi without evidence of hydronephrosis.  Chest, abdomen, and pelvis CT scan with contrast on 06/17/2016 revealed a new solitary mildly enlarged 1.0 cm left axillary lymph node, nonspecific, nodal metastasis not excluded.  There were no additional potential findings of metastatic disease in the chest, abdomen or pelvis.  There was no evidence of local tumor recurrence in the stomach.   Chest, abdomen, and pelvis CT on 08/25/2017 demonstrated further enlargement of the LEFT axillary lymph node.  Of note, this area was previously biopsied under ultrasound guidance on 08/2016 and found to be  benign lymphoid hyperplasia.  Axillary node measured 12 mm (previously 10 mm). There was a stable 2 mm lung nodule in the RIGHT middle lobe.  There was a 5 mm subcutaneous nodule laterally in the RIGHT chest wall (stable).  Chest, abdomen, and pelvic CT on 08/25/2018 revealed a stable exam. The left axillary lymph nodes described on previous exam appeared stable to decreased in size in the interval.  There was no evidence of metastatic disease within the chest, abdomen or pelvis.    Bone density on 12/24/2017 revealed osteopenia with a T score of -1.8 in the left femoral neck.  Symptomatically, she is doing well.  Exam is unremarkable.  Plan: 1.   Labs today:  CBC  with diff, CMP, CA27.29. 2.   Right breast cancer Clinically doing well.        No evidence of recurrent disease. Schedule mammogram on 09/07/2019. Review fluctuating CA27.29.  Discuss prior imaging studies with CA27.29 from 39.2 - 53.5. Review on next imaging for GIST scheduled  for 08/2019. 3.   Gastrointestinal stromal cancer Clinically doing well. Chest, abdomen, and pelvic CT on 08/28/2019. 4.   Osteopenia Continue calcium and vitamin D. 5.   RTC in 6 months for MD assessment and labs (CBC with diff, CMP, CA27.29).  I discussed the assessment and treatment plan with the patient.  The patient was provided an opportunity to ask questions and all were answered.  The patient agreed with the plan and demonstrated an understanding of the instructions.  The patient was advised to call back if the symptoms worsen or if the condition fails to improve as anticipated.  I provided 13 minutes of face-to-face time during this this encounter and > 50% was spent counseling as documented under my assessment and plan.    Lequita Asal, MD, PhD    03/30/2019, 3:37 PM  I, Molly Dorshimer, am acting as Education administrator for [employer]. Mike Gip, MD, PhD.  I, Melissa C. Mike Gip, MD, have reviewed the above documentation for accuracy and completeness, and I agree with the above.

## 2019-03-30 ENCOUNTER — Other Ambulatory Visit: Payer: Medicare Other

## 2019-03-30 ENCOUNTER — Encounter: Payer: Self-pay | Admitting: Hematology and Oncology

## 2019-03-30 ENCOUNTER — Inpatient Hospital Stay: Payer: Medicare Other | Attending: Hematology and Oncology | Admitting: Hematology and Oncology

## 2019-03-30 VITALS — BP 134/75 | HR 74 | Temp 98.5°F | Resp 18 | Ht 64.0 in | Wt 119.6 lb

## 2019-03-30 DIAGNOSIS — Z9011 Acquired absence of right breast and nipple: Secondary | ICD-10-CM

## 2019-03-30 DIAGNOSIS — M85852 Other specified disorders of bone density and structure, left thigh: Secondary | ICD-10-CM

## 2019-03-30 DIAGNOSIS — Z923 Personal history of irradiation: Secondary | ICD-10-CM | POA: Diagnosis not present

## 2019-03-30 DIAGNOSIS — E7800 Pure hypercholesterolemia, unspecified: Secondary | ICD-10-CM | POA: Insufficient documentation

## 2019-03-30 DIAGNOSIS — Z9221 Personal history of antineoplastic chemotherapy: Secondary | ICD-10-CM

## 2019-03-30 DIAGNOSIS — Z853 Personal history of malignant neoplasm of breast: Secondary | ICD-10-CM | POA: Diagnosis not present

## 2019-03-30 DIAGNOSIS — C49A0 Gastrointestinal stromal tumor, unspecified site: Secondary | ICD-10-CM

## 2019-03-30 DIAGNOSIS — K219 Gastro-esophageal reflux disease without esophagitis: Secondary | ICD-10-CM | POA: Diagnosis not present

## 2019-03-30 DIAGNOSIS — Z79899 Other long term (current) drug therapy: Secondary | ICD-10-CM | POA: Insufficient documentation

## 2019-03-30 DIAGNOSIS — Z170 Estrogen receptor positive status [ER+]: Secondary | ICD-10-CM | POA: Diagnosis not present

## 2019-03-30 DIAGNOSIS — Z7982 Long term (current) use of aspirin: Secondary | ICD-10-CM | POA: Insufficient documentation

## 2019-03-30 DIAGNOSIS — Z8509 Personal history of malignant neoplasm of other digestive organs: Secondary | ICD-10-CM | POA: Diagnosis not present

## 2019-03-30 DIAGNOSIS — E119 Type 2 diabetes mellitus without complications: Secondary | ICD-10-CM | POA: Diagnosis not present

## 2019-03-30 DIAGNOSIS — I10 Essential (primary) hypertension: Secondary | ICD-10-CM | POA: Diagnosis not present

## 2019-03-30 LAB — COMPREHENSIVE METABOLIC PANEL
ALT: 13 U/L (ref 0–44)
AST: 19 U/L (ref 15–41)
Albumin: 4 g/dL (ref 3.5–5.0)
Alkaline Phosphatase: 55 U/L (ref 38–126)
Anion gap: 10 (ref 5–15)
BUN: 16 mg/dL (ref 8–23)
CO2: 27 mmol/L (ref 22–32)
Calcium: 9.5 mg/dL (ref 8.9–10.3)
Chloride: 105 mmol/L (ref 98–111)
Creatinine, Ser: 0.77 mg/dL (ref 0.44–1.00)
GFR calc Af Amer: >60 mL/min (ref >60)
GFR calc non Af Amer: >60 mL/min (ref >60)
Glucose, Bld: 84 mg/dL (ref 70–99)
Potassium: 3.9 mmol/L (ref 3.5–5.1)
Sodium: 142 mmol/L (ref 135–145)
Total Bilirubin: 0.3 mg/dL (ref 0.3–1.2)
Total Protein: 7.7 g/dL (ref 6.5–8.1)

## 2019-03-30 LAB — CBC WITH DIFFERENTIAL/PLATELET
Abs Immature Granulocytes: 0.05 10*3/uL (ref 0.00–0.07)
Basophils Absolute: 0 10*3/uL (ref 0.0–0.1)
Basophils Relative: 0 %
Eosinophils Absolute: 0.1 10*3/uL (ref 0.0–0.5)
Eosinophils Relative: 1 %
HCT: 32.3 % — ABNORMAL LOW (ref 36.0–46.0)
Hemoglobin: 10.9 g/dL — ABNORMAL LOW (ref 12.0–15.0)
Immature Granulocytes: 1 %
Lymphocytes Relative: 27 %
Lymphs Abs: 2.3 10*3/uL (ref 0.7–4.0)
MCH: 32 pg (ref 26.0–34.0)
MCHC: 33.7 g/dL (ref 30.0–36.0)
MCV: 94.7 fL (ref 80.0–100.0)
Monocytes Absolute: 0.5 10*3/uL (ref 0.1–1.0)
Monocytes Relative: 6 %
Neutro Abs: 5.4 10*3/uL (ref 1.7–7.7)
Neutrophils Relative %: 65 %
Platelets: 259 10*3/uL (ref 150–400)
RBC: 3.41 MIL/uL — ABNORMAL LOW (ref 3.87–5.11)
RDW: 13.1 % (ref 11.5–15.5)
WBC: 8.3 10*3/uL (ref 4.0–10.5)
nRBC: 0 % (ref 0.0–0.2)

## 2019-03-30 NOTE — Progress Notes (Signed)
Patient c/o increase pain noted to left shoulder ( pain level today 8) The patient states with movement the pain increases.

## 2019-03-31 LAB — CANCER ANTIGEN 27.29: CA 27.29: 53 U/mL — ABNORMAL HIGH (ref 0.0–38.6)

## 2019-04-06 ENCOUNTER — Ambulatory Visit (INDEPENDENT_AMBULATORY_CARE_PROVIDER_SITE_OTHER): Payer: Medicare Other | Admitting: Nurse Practitioner

## 2019-04-06 ENCOUNTER — Encounter: Payer: Self-pay | Admitting: Nurse Practitioner

## 2019-04-06 ENCOUNTER — Other Ambulatory Visit: Payer: Self-pay | Admitting: Nurse Practitioner

## 2019-04-06 ENCOUNTER — Other Ambulatory Visit: Payer: Self-pay

## 2019-04-06 VITALS — BP 120/64 | HR 68 | Temp 98.4°F | Resp 14 | Ht 64.0 in | Wt 120.0 lb

## 2019-04-06 DIAGNOSIS — G8929 Other chronic pain: Secondary | ICD-10-CM

## 2019-04-06 DIAGNOSIS — M25512 Pain in left shoulder: Secondary | ICD-10-CM

## 2019-04-06 DIAGNOSIS — E119 Type 2 diabetes mellitus without complications: Secondary | ICD-10-CM | POA: Diagnosis not present

## 2019-04-06 DIAGNOSIS — M542 Cervicalgia: Secondary | ICD-10-CM

## 2019-04-06 DIAGNOSIS — M85852 Other specified disorders of bone density and structure, left thigh: Secondary | ICD-10-CM

## 2019-04-06 MED ORDER — TIZANIDINE HCL 2 MG PO CAPS: 2.0000 mg | ORAL_CAPSULE | Freq: Every evening | ORAL | 0 refills | Status: DC | PRN

## 2019-04-06 NOTE — Patient Instructions (Addendum)
- Please continue to take tylenol 650-1000mg  every 8 hours ( no more than 3,000mg  in 24 hours) - Please alternate between ice and heat to help with painful joints,  - Take muscle relaxer before bedtime- be aware it can make you drowsy so do no try to get up quickly. - You should hear back from referral to orthopedic within the month.  Cervical Radiculopathy Cervical radiculopathy happens when a nerve in the neck (cervical nerve) is pinched or bruised. This condition can develop because of an injury or as part of the normal aging process. Pressure on the cervical nerves can cause pain or numbness that runs from the neck all the way down into the arm and fingers. Usually, this condition gets better with rest. Treatment may be needed if the condition does not improve. What are the causes? This condition may be caused by:  Injury.  Slipped (herniated) disk.  Muscle tightness in the neck because of overuse.  Arthritis.  Breakdown or degeneration in the bones and joints of the spine (spondylosis) due to aging.  Bone spurs that may develop near the cervical nerves. What are the signs or symptoms? Symptoms of this condition include:  Pain that runs from the neck to the arm and hand. The pain can be severe or irritating. It may be worse when the neck is moved.  Numbness or weakness in the affected arm and hand. How is this diagnosed? This condition may be diagnosed based on symptoms, medical history, and a physical exam. You may also have tests, including:  X-rays.  CT scan.  MRI.  Electromyogram (EMG).  Nerve conduction tests. How is this treated? In many cases, treatment is not needed for this condition. With rest, the condition usually gets better over time. If treatment is needed, options may include:  Physical therapy to strengthen your neck muscles.  Medicines, such as NSAIDs, oral corticosteroids, or spinal injections.  Surgery. This may be needed if other treatments do not  help. Various types of surgery may be done depending on the cause of your problems. Follow these instructions at home: Managing pain  Take over-the-counter and prescription medicines only as told by your health care provider.  If directed, apply ice to the affected area. ? Put ice in a plastic bag. ? Place a towel between your skin and the bag. ? Leave the ice on for 20 minutes, 2-3 times per day.  If ice does not help, you can try using heat. Take a warm shower or warm bath, or use a heat pack as told by your health care provider.  Try a gentle neck and shoulder massage to help relieve symptoms. Activity  Rest as needed. Follow instructions from your health care provider about any restrictions on activities.  Do stretching and strengthening exercises as told by your health care provider or physical therapist. General instructions  Use a flat pillow when you sleep.  Keep all follow-up visits as told by your health care provider. This is important. Contact a health care provider if:  Your condition does not improve with treatment. Get help right away if:  Your pain gets much worse and cannot be controlled with medicines.  You have weakness or numbness in your hand, arm, face, or leg.  You have a high fever.  You have a stiff, rigid neck.  You lose control of your bowels or your bladder (have incontinence).  You have trouble with walking, balance, or speaking. This information is not intended to replace advice given  to you by your health care provider. Make sure you discuss any questions you have with your health care provider. Document Released: 07/01/2001 Document Revised: 03/13/2016 Document Reviewed: 11/30/2014 Elsevier Interactive Patient Education  [employer].

## 2019-04-06 NOTE — Progress Notes (Signed)
Name: Rachael Castro   MRN: [id number]    DOB: May 25, 1931   Date:04/06/2019       Progress Note  Subjective  Chief Complaint  Chief Complaint  Patient presents with  . Neck Pain    left radiates down shoulder onset last week    HPI  Patient endorses neck pain that goes down bilateral arms but more on the left. Patient states she has had this pain last year and was seen at the pain clinic but the pain resolved a few months later after she had injections at the pain clinic so she stopped going. Patient states pain resolves with extra strength tylenol for about 6 hours. Pain is worse when she is laying in bed, states sometimes her left shoulder gets stiff and she has decreased range of motion. She takes vitamin D and calcium supplementation for her osteopenia. Generally she is active and walks around her home but pain has been more limiting recently. Denies falls or known injuries.   Denies chest pain, paresthesias, jaw pain, shortness of breath, nausea, dizziness, lightheadedness.   Patient takes janumet as prescribed without missed doses.  Lab Results  Component Value Date   HGBA1C 6.5 (H) 12/10/2018    PHQ2/9: Depression screen [facility] 2/9 04/06/2019 12/10/2018 09/21/2018 06/09/2018 09/21/2017  Decreased Interest 0 0 0 0 0  Down, Depressed, Hopeless 0 0 0 0 0  PHQ - 2 Score 0 0 0 0 0  Altered sleeping 0 0 0 - -  Tired, decreased energy 0 0 0 - -  Change in appetite 0 0 0 - -  Feeling bad or failure about yourself  0 0 0 - -  Trouble concentrating 0 0 0 - -  Moving slowly or fidgety/restless 0 0 0 - -  Suicidal thoughts 0 0 0 - -  PHQ-9 Score 0 0 0 - -  Difficult doing work/chores Not difficult at all Not difficult at all - - -     PHQ reviewed. Negative  Patient Active Problem List   Diagnosis Date Noted  . Goals of care, counseling/discussion 02/03/2018  . Osteopenia 12/24/2017  . Encounter for Medicare annual wellness exam 09/21/2017  . History of shingles 09/21/2017  .  Hypomagnesemia 09/01/2016  . Gastrointestinal stromal tumor (GIST) ([place]) 08/09/2016  . Medication monitoring encounter 04/29/2016  . Hyperlipidemia 10/23/2015  . Type 2 diabetes mellitus ([place]) 10/23/2015  . Hypertension 06/18/2015  . H/O malignant gastrointestinal stromal tumor (GIST) 05/30/2015  . Postherpetic neuralgia 04/17/2015  . DDD (degenerative disc disease), cervical 04/17/2015  . DDD (degenerative disc disease), lumbar 04/17/2015  . Personal history of malignant neoplasm of breast 07/04/2013  . History of colonic polyps     Past Medical History:  Diagnosis Date  . Breast cancer ([place]) 1999   right breast  . Diabetes mellitus   . GERD (gastroesophageal reflux disease)   . GIST (gastrointestinal stromal tumor), malignant ([place]) 2013  . History of shingles   . Hypercholesteremia   . Hypertension   . Kidney stones   . 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 Osteopenia 12/24/2017   March 2019; next scan March 2021  . Personal history of chemotherapy   . Personal history of colonic polyps   . Thyroid disease     Past Surgical History:  Procedure Laterality Date  . ABDOMINAL HYSTERECTOMY    . APPENDECTOMY    . BREAST BIOPSY Left 09/08/2016   Done by Dr. Jamal Collin  .  BREAST EXCISIONAL BIOPSY Right 1999   Mastectomy  . BREAST SURGERY Right 1999   mastectomy  . CATARACT EXTRACTION W/PHACO Left 04/30/2015   Procedure: CATARACT EXTRACTION PHACO AND INTRAOCULAR LENS PLACEMENT (IOC);  Surgeon: Estill Cotta, MD;  Location: [facility] ORS;  Service: Ophthalmology;  Laterality: Left;  [place] 01:39 AP% 23.3 CDE 40.42 fluid pack lot # [id number]  . COLONOSCOPY  2008   Sankar  . EUS  02/12/2012   Procedure: UPPER ENDOSCOPIC ULTRASOUND (EUS) LINEAR;  Surgeon: Milus Banister, MD;  Location: [facility];  Service: Endoscopy;  Laterality: N/A;  radial linear  . EYE SURGERY Right 2013   cataract  . HERNIA REPAIR  2012  . LAPAROTOMY  2013   excision of gastric  wall mass   . MASTECTOMY    . SALPINGOOPHORECTOMY Right 1979  . THYROIDECTOMY  1978  . TONSILLECTOMY    . TUBAL LIGATION    . UPPER GI ENDOSCOPY  2013    Social History   Tobacco Use  . Smoking status: Never Smoker  . Smokeless tobacco: Never Used  Substance Use Topics  . Alcohol use: No     Current Outpatient Medications:  .  atorvastatin (LIPITOR) 80 MG tablet, TAKE 1 TABLET BY MOUTH EVERYDAY AT BEDTIME, Disp: 90 tablet, Rfl: 0 .  calcium citrate-vitamin D 200-200 MG-UNIT TABS, Take 1 tablet by mouth daily., Disp: , Rfl:  .  losartan (COZAAR) 25 MG tablet, TAKE 1 TABLET BY MOUTH EVERY DAY, Disp: 90 tablet, Rfl: 1 .  Multiple Vitamin (MULTIVITAMIN) tablet, Take 1 tablet by mouth daily., Disp: , Rfl:  .  omeprazole (PRILOSEC) 20 MG capsule, TAKE 1 CAPSULE BY MOUTH EVERY DAY, Disp: 90 capsule, Rfl: 1 .  sitaGLIPtin-metformin (JANUMET) 50-500 MG tablet, Take 1 tablet by mouth 2 (two) times daily with a meal., Disp: 180 tablet, Rfl: 3 .  thiamine (VITAMIN B-1) 100 MG tablet, Take 100 mg by mouth daily., Disp: , Rfl:  .  triamcinolone cream (KENALOG) 0.1 %, Apply 1 application topically 2 (two) times daily. (for the legs) (Patient not taking: Reported on 04/06/2019), Disp: 30 g, Rfl: 0  No Known Allergies  ROS   No other specific complaints in a complete review of systems (except as listed in HPI above).  Objective  Vitals:   04/06/19 1533  BP: 120/64  Pulse: 68  Resp: 14  Temp: 98.4 F (36.9 C)  TempSrc: Oral  SpO2: 93%  Weight: 120 lb (54.4 kg)  Height: 5\' 4"  (1.626 m)    Body mass index is 20.6 kg/m.  Nursing Note and Vital Signs reviewed.  Physical Exam Constitutional:      Appearance: Normal appearance.  Neck:     Musculoskeletal: Muscular tenderness present.  Cardiovascular:     Rate and Rhythm: Normal rate and regular rhythm.     Pulses: Normal pulses.  Pulmonary:     Effort: Pulmonary effort is normal.  Musculoskeletal:     Right shoulder: She  exhibits pain. She exhibits normal range of motion, no tenderness, no bony tenderness, no swelling, no spasm and normal pulse.     Left shoulder: She exhibits decreased range of motion and tenderness. She exhibits no bony tenderness, no swelling, no deformity, normal pulse and normal strength.     Cervical back: She exhibits tenderness, pain and spasm. She exhibits normal range of motion, no bony tenderness, no swelling, no edema and no deformity.       Back:  Lymphadenopathy:     Cervical:  No cervical adenopathy.  Skin:    General: Skin is warm.     Findings: No erythema or rash.  Neurological:     General: No focal deficit present.     Mental Status: She is alert and oriented to person, place, and time.     Sensory: No sensory deficit.     Motor: No weakness.  Psychiatric:        Mood and Affect: Mood normal.        Behavior: Behavior normal.        No results found for this or any previous visit (from the past 48 hour(s)).  Assessment & Plan 1. Neck pain Heat/ice, acetaminophen, ROM exercises - Ambulatory referral to Orthopedic Surgery - tizanidine (ZANAFLEX) 2 MG capsule; Take 1 capsule (2 mg total) by mouth at bedtime as needed for muscle spasms.  Dispense: 30 capsule; Refill: 0  2. Chronic left shoulder pain Possible frozen shoulder may benefit from joint injections- disucssed increase risk with diabetes - Ambulatory referral to Orthopedic Surgery - tizanidine (ZANAFLEX) 2 MG capsule; Take 1 capsule (2 mg total) by mouth at bedtime as needed for muscle spasms.  Dispense: 30 capsule; Refill: 0  3. Type 2 diabetes mellitus without complication, without long-term current use of insulin (HCC) Well controlled at last check, will recheck a1c and routine visit  4. Osteopenia of neck of left femur Continue supplementation and weight bearing exercises. Discussed decreasing fall risk

## 2019-04-29 ENCOUNTER — Telehealth: Payer: Self-pay

## 2019-04-29 NOTE — Telephone Encounter (Signed)
Copied from [place] [id number]. Topic: Referral - Status >> Apr 29, 2019  2:46 PM Yvette Rack wrote: Reason for CRM: Pt stated she has not heard from the orthopedic office in regards to the referral. Pt requests call back. Cb# [phone]   I contacted Rosanne Gutting, spoke to [place], to check the status of this referral. He informed me that they have not received anything and asked if I can fax it to [phone] instead of the [phone],   Referral has been re-faxed as requested.

## 2019-05-02 ENCOUNTER — Other Ambulatory Visit: Payer: Self-pay | Admitting: Nurse Practitioner

## 2019-05-02 DIAGNOSIS — M542 Cervicalgia: Secondary | ICD-10-CM

## 2019-05-02 DIAGNOSIS — G8929 Other chronic pain: Secondary | ICD-10-CM

## 2019-05-11 ENCOUNTER — Encounter: Payer: Self-pay | Admitting: General Surgery

## 2019-05-12 ENCOUNTER — Other Ambulatory Visit: Payer: Self-pay | Admitting: Family Medicine

## 2019-05-14 ENCOUNTER — Other Ambulatory Visit: Payer: Self-pay | Admitting: Family Medicine

## 2019-05-16 DIAGNOSIS — M7540 Impingement syndrome of unspecified shoulder: Secondary | ICD-10-CM | POA: Insufficient documentation

## 2019-05-16 NOTE — Telephone Encounter (Signed)
Patient called in stating she is wanting a refill on this prescription due to being completely out. Please advise and call back is [phone] when done please.

## 2019-06-10 ENCOUNTER — Ambulatory Visit: Payer: Medicare Other | Admitting: Nurse Practitioner

## 2019-06-10 ENCOUNTER — Other Ambulatory Visit: Payer: Self-pay

## 2019-06-10 ENCOUNTER — Encounter: Payer: Self-pay | Admitting: Nurse Practitioner

## 2019-06-10 VITALS — BP 142/62 | HR 78 | Temp 97.1°F | Resp 16 | Ht 64.0 in | Wt 115.6 lb

## 2019-06-10 DIAGNOSIS — I10 Essential (primary) hypertension: Secondary | ICD-10-CM

## 2019-06-10 DIAGNOSIS — Z8509 Personal history of malignant neoplasm of other digestive organs: Secondary | ICD-10-CM

## 2019-06-10 DIAGNOSIS — E782 Mixed hyperlipidemia: Secondary | ICD-10-CM

## 2019-06-10 DIAGNOSIS — Z853 Personal history of malignant neoplasm of breast: Secondary | ICD-10-CM

## 2019-06-10 DIAGNOSIS — M85852 Other specified disorders of bone density and structure, left thigh: Secondary | ICD-10-CM | POA: Diagnosis not present

## 2019-06-10 DIAGNOSIS — E119 Type 2 diabetes mellitus without complications: Secondary | ICD-10-CM | POA: Diagnosis not present

## 2019-06-10 DIAGNOSIS — Z23 Encounter for immunization: Secondary | ICD-10-CM | POA: Diagnosis not present

## 2019-06-10 DIAGNOSIS — K219 Gastro-esophageal reflux disease without esophagitis: Secondary | ICD-10-CM

## 2019-06-10 LAB — POCT GLYCOSYLATED HEMOGLOBIN (HGB A1C): HbA1c, POC (controlled diabetic range): 6.5 % (ref 0.0–7.0)

## 2019-06-10 MED ORDER — JANUMET 50-500 MG PO TABS: ORAL_TABLET | ORAL | 1 refills | Status: DC

## 2019-06-10 MED ORDER — LOSARTAN POTASSIUM 25 MG PO TABS: 25.0000 mg | ORAL_TABLET | Freq: Every day | ORAL | 1 refills | Status: DC

## 2019-06-10 MED ORDER — FAMOTIDINE 20 MG PO TABS: 20.0000 mg | ORAL_TABLET | Freq: Two times a day (BID) | ORAL | 0 refills | Status: DC

## 2019-06-10 MED ORDER — ATORVASTATIN CALCIUM 80 MG PO TABS: ORAL_TABLET | ORAL | 1 refills | Status: DC

## 2019-06-10 NOTE — Patient Instructions (Signed)

## 2019-06-10 NOTE — Progress Notes (Signed)
Name: Rachael Castro   MRN: [id number]    DOB: 12-27-1930   Date:06/10/2019       Progress Note  Subjective  Chief Complaint  Chief Complaint  Patient presents with  . Medication Refill    6 month F/U  . Hypertension  . Diabetes    Checsk weekly Average BS 112-20  . Hyperlipidemia  . Gastroesophageal Reflux  . Neck Pain    Recently seen Orthopedic was given Prednisone shot in her neck and pain medication-but the pain will radiate to her left finger and make them sore and unable to lift anything  . Gait Problem    HPI  Neck pain Patient was given steroid shot in neck and pain medication states pain initially improved but has come back and is same and sometimes radiates to left finger. Has follow-up with orthopedic Tuesday.   Hypertension Patient is on losartan 25mg  daily.  Takes medications as prescribed with no missed doses a month.  She is not compliant with low-salt diet. Yesterday she had some more salt. Denies chest pain, headaches, blurry vision, dizziness.   Hyperlipidemia Patient rx atorvastatin 80mg  daily. States she has been taking if most days; states was told to take 1.5 tablets of the 40mg .   Diet: eats vegetables daily; [employer] foods a few times, mostly eats chicken  Denies myalgias Lab Results  Component Value Date   CHOL 183 12/10/2018   HDL 48 (L) 12/10/2018   LDLCALC 109 (H) 12/10/2018   TRIG 151 (H) 12/10/2018   CHOLHDL 3.8 12/10/2018    Diabetes Mellitus Patient is rx janumet 5-500 BID. Takes medications as prescribed with no missed doses a month.  Checks blood sugars at home occasionally- ranges from 98-120 Denies polyphagia, polydipsia, polyuria.  She is on an ARB and statin Lab Results  Component Value Date   HGBA1C 6.5 06/10/2019   GERD Patient is taking prilosec 20mg  daily.  Osteopenia Takes vitamin D and calcium supplementation Weight bearing exercises: walks around the house  Assistive devices: none    PHQ2/9: Depression screen  [facility] 2/9 06/10/2019 04/06/2019 12/10/2018 09/21/2018 06/09/2018  Decreased Interest 0 0 0 0 0  Down, Depressed, Hopeless 0 0 0 0 0  PHQ - 2 Score 0 0 0 0 0  Altered sleeping 0 0 0 0 -  Tired, decreased energy 0 0 0 0 -  Change in appetite 0 0 0 0 -  Feeling bad or failure about yourself  0 0 0 0 -  Trouble concentrating 0 0 0 0 -  Moving slowly or fidgety/restless 0 0 0 0 -  Suicidal thoughts 0 0 0 0 -  PHQ-9 Score 0 0 0 0 -  Difficult doing work/chores Not difficult at all Not difficult at all Not difficult at all - -    PHQ reviewed. Negative  Patient Active Problem List   Diagnosis Date Noted  . Osteopenia 12/24/2017  . Hypomagnesemia 09/01/2016  . Hyperlipidemia 10/23/2015  . Type 2 diabetes mellitus ([place]) 10/23/2015  . Hypertension 06/18/2015  . H/O malignant gastrointestinal stromal tumor (GIST) 05/30/2015  . Postherpetic neuralgia 04/17/2015  . DDD (degenerative disc disease), cervical 04/17/2015  . DDD (degenerative disc disease), lumbar 04/17/2015  . Personal history of malignant neoplasm of breast 07/04/2013  . History of colonic polyps     Past Medical History:  Diagnosis Date  . Breast cancer ([place]) 1999   right breast  . Diabetes mellitus   . GERD (gastroesophageal reflux disease)   . GIST (  gastrointestinal stromal tumor), malignant ([place]) 2013  . History of shingles   . Hypercholesteremia   . Hypertension   . Kidney stones   . 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 Osteopenia 12/24/2017   March 2019; next scan March 2021  . Personal history of chemotherapy   . Personal history of colonic polyps   . Thyroid disease     Past Surgical History:  Procedure Laterality Date  . ABDOMINAL HYSTERECTOMY    . APPENDECTOMY    . BREAST BIOPSY Left 09/08/2016   Done by Dr. Jamal Collin  . BREAST EXCISIONAL BIOPSY Right 1999   Mastectomy  . BREAST SURGERY Right 1999   mastectomy  . CATARACT EXTRACTION W/PHACO Left 04/30/2015   Procedure:  CATARACT EXTRACTION PHACO AND INTRAOCULAR LENS PLACEMENT (IOC);  Surgeon: Estill Cotta, MD;  Location: [facility] ORS;  Service: Ophthalmology;  Laterality: Left;  [place] 01:39 AP% 23.3 CDE 40.42 fluid pack lot # [id number]  . COLONOSCOPY  2008   Sankar  . EUS  02/12/2012   Procedure: UPPER ENDOSCOPIC ULTRASOUND (EUS) LINEAR;  Surgeon: Milus Banister, MD;  [facility];  Service: Endoscopy;  Laterality: N/A;  radial linear  . EYE SURGERY Right 2013   cataract  . HERNIA REPAIR  2012  . LAPAROTOMY  2013   excision of gastric wall mass   . MASTECTOMY    . SALPINGOOPHORECTOMY Right 1979  . THYROIDECTOMY  1978  . TONSILLECTOMY    . TUBAL LIGATION    . UPPER GI ENDOSCOPY  2013    Social History   Tobacco Use  . Smoking status: Never Smoker  . Smokeless tobacco: Never Used  Substance Use Topics  . Alcohol use: No     Current Outpatient Medications:  .  atorvastatin (LIPITOR) 80 MG tablet, TAKE 1 TABLET BY MOUTH EVERYDAY AT BEDTIME, Disp: 90 tablet, Rfl: 0 .  calcium citrate-vitamin D 200-200 MG-UNIT TABS, Take 1 tablet by mouth daily., Disp: , Rfl:  .  HYDROcodone-acetaminophen (NORCO/VICODIN) 5-325 MG tablet, Take 1 tablet by mouth every 8 (eight) hours., Disp: , Rfl:  .  JANUMET 50-500 MG tablet, TAKE 1 TABLET BY MOUTH 2 (TWO) TIMES DAILY WITH A MEAL., Disp: 180 tablet, Rfl: 1 .  losartan (COZAAR) 25 MG tablet, TAKE 1 TABLET BY MOUTH EVERY DAY, Disp: 90 tablet, Rfl: 1 .  Magnesium 100 MG CAPS, Take 1 capsule by mouth daily., Disp: , Rfl:  .  Multiple Vitamin (MULTIVITAMIN) tablet, Take 1 tablet by mouth daily., Disp: , Rfl:  .  omeprazole (PRILOSEC) 20 MG capsule, TAKE 1 CAPSULE BY MOUTH EVERY DAY, Disp: 90 capsule, Rfl: 1 .  thiamine (VITAMIN B-1) 100 MG tablet, Take 100 mg by mouth daily., Disp: , Rfl:  .  tizanidine (ZANAFLEX) 2 MG capsule, TAKE 1 CAPSULE (2 MG TOTAL) BY MOUTH AT BEDTIME AS NEEDED FOR MUSCLE SPASMS., Disp: 27 capsule, Rfl: 1  No Known  Allergies  ROS    No other specific complaints in a complete review of systems (except as listed in HPI above).  Objective  Vitals:   06/10/19 1001  BP: (!) 142/62  Pulse: 78  Resp: 16  Temp: (!) 97.1 F (36.2 C)  TempSrc: Temporal  SpO2: 98%  Weight: 115 lb 9.6 oz (52.4 kg)  Height: 5\' 4"  (1.626 m)     Body mass index is 19.84 kg/m.  Nursing Note and Vital Signs reviewed.  Physical Exam Constitutional:  Appearance: Normal appearance. She is well-developed.  HENT:     Head: Normocephalic and atraumatic.     Right Ear: Hearing normal.     Left Ear: Hearing normal.  Eyes:     Conjunctiva/sclera: Conjunctivae normal.  Cardiovascular:     Rate and Rhythm: Normal rate and regular rhythm.     Heart sounds: Normal heart sounds.  Pulmonary:     Effort: Pulmonary effort is normal.     Breath sounds: Normal breath sounds.  Neurological:     Mental Status: She is alert and oriented to person, place, and time.  Psychiatric:        Speech: Speech normal.        Behavior: Behavior normal. Behavior is cooperative.        Thought Content: Thought content normal.        Judgment: Judgment normal.       Results for orders placed or performed in visit on 06/10/19 (from the past 48 hour(s))  POCT HgB A1C     Status: Normal   Collection Time: 06/10/19 10:10 AM  Result Value Ref Range   Hemoglobin A1C     HbA1c POC (<> result, manual entry)     HbA1c, POC (prediabetic range)     HbA1c, POC (controlled diabetic range) 6.5 0.0 - 7.0 %    Assessment & Plan 1. Type 2 diabetes mellitus without complication, without long-term current use of insulin (HCC) - POCT HgB A1C - Urine Microalbumin w/creat. ratio - losartan (COZAAR) 25 MG tablet; Take 1 tablet (25 mg total) by mouth daily.  Dispense: 90 tablet; Refill: 1 - sitaGLIPtin-metformin (JANUMET) 50-500 MG tablet; TAKE 1 TABLET BY MOUTH 2 (TWO) TIMES DAILY WITH A MEAL.  Dispense: 180 tablet; Refill: 1  2. Essential  hypertension Stable  - losartan (COZAAR) 25 MG tablet; Take 1 tablet (25 mg total) by mouth daily.  Dispense: 90 tablet; Refill: 1  3. Osteopenia of neck of left femur Continue supplementation   4. Mixed hyperlipidemia - atorvastatin (LIPITOR) 80 MG tablet; TAKE 1 TABLET BY MOUTH EVERYDAY AT BEDTIME  Dispense: 90 tablet; Refill: 1  5. Gastroesophageal reflux disease without esophagitis - famotidine (PEPCID) 20 MG tablet; Take 1 tablet (20 mg total) by mouth 2 (two) times daily.  Dispense: 180 tablet; Refill: 0  6. Personal history of malignant neoplasm of breast Continue follow up with oncology   7. H/O malignant gastrointestinal stromal tumor (GIST) Continue follow up with oncology

## 2019-06-11 LAB — MICROALBUMIN / CREATININE URINE RATIO
Creatinine, Urine: 120 mg/dL (ref 20–275)
Microalb Creat Ratio: 8 mcg/mg creat (ref <30)
Microalb, Ur: 1 mg/dL

## 2019-08-06 ENCOUNTER — Other Ambulatory Visit: Payer: Self-pay | Admitting: Family Medicine

## 2019-08-06 NOTE — Telephone Encounter (Signed)
Requested Prescriptions  Pending Prescriptions Disp Refills  . omeprazole (PRILOSEC) 20 MG capsule [Pharmacy Med Name: OMEPRAZOLE DR 20 MG CAPSULE] 90 capsule 1    Sig: TAKE 1 CAPSULE BY MOUTH EVERY DAY     Gastroenterology: Proton Pump Inhibitors Passed - 08/06/2019 10:42 AM      Passed - Valid encounter within last 12 months    Recent Outpatient Visits          1 month ago Type 2 diabetes mellitus without complication, without long-term current use of insulin ([place])   [place], Bethel Born, NP   4 months ago Neck pain   [facility] [place], Benjamine Mola E, NP   7 months ago Type 2 diabetes mellitus without complication, without long-term current use of insulin [facility])   [place], Satira Anis, MD   1 year ago Type 2 diabetes mellitus without complication, without long-term current use of insulin [facility])   [place], Satira Anis, MD   1 year ago Type 2 diabetes mellitus without complication, without long-term current use of insulin [facility])   [place], Satira Anis, MD      Future Appointments            In 3 weeks [place], Hunt Oris, PA-C [place]   In 1 month Delsa Grana, PA-C [facility], [place]   In 1 month  [facility], [facility]

## 2019-08-26 ENCOUNTER — Ambulatory Visit: Payer: Medicare Other

## 2019-08-26 ENCOUNTER — Ambulatory Visit
Admission: RE | Admit: 2019-08-26 | Discharge: 2019-08-26 | Disposition: A | Discharge status: Home / Self Care | Payer: Medicare Other | Source: Ambulatory Visit | Attending: Hematology and Oncology | Admitting: Hematology and Oncology

## 2019-08-26 ENCOUNTER — Other Ambulatory Visit: Payer: Self-pay

## 2019-08-26 DIAGNOSIS — C49A0 Gastrointestinal stromal tumor, unspecified site: Secondary | ICD-10-CM | POA: Diagnosis not present

## 2019-09-02 ENCOUNTER — Ambulatory Visit: Payer: Medicare Other | Admitting: Urology

## 2019-09-08 ENCOUNTER — Ambulatory Visit
Admission: RE | Admit: 2019-09-08 | Discharge: 2019-09-08 | Disposition: A | Discharge status: Home / Self Care | Payer: Medicare Other | Source: Ambulatory Visit | Attending: Hematology and Oncology | Admitting: Hematology and Oncology

## 2019-09-08 DIAGNOSIS — Z853 Personal history of malignant neoplasm of breast: Secondary | ICD-10-CM | POA: Diagnosis not present

## 2019-09-08 DIAGNOSIS — Z1231 Encounter for screening mammogram for malignant neoplasm of breast: Secondary | ICD-10-CM | POA: Insufficient documentation

## 2019-09-12 ENCOUNTER — Other Ambulatory Visit: Payer: Self-pay

## 2019-09-12 ENCOUNTER — Encounter: Payer: Self-pay | Admitting: Family Medicine

## 2019-09-12 ENCOUNTER — Ambulatory Visit: Payer: Medicare Other | Admitting: Family Medicine

## 2019-09-12 VITALS — BP 140/62 | HR 86 | Temp 98.3°F | Resp 14 | Ht 64.0 in | Wt 118.7 lb

## 2019-09-12 DIAGNOSIS — I10 Essential (primary) hypertension: Secondary | ICD-10-CM

## 2019-09-12 DIAGNOSIS — E119 Type 2 diabetes mellitus without complications: Secondary | ICD-10-CM

## 2019-09-12 DIAGNOSIS — E782 Mixed hyperlipidemia: Secondary | ICD-10-CM | POA: Diagnosis not present

## 2019-09-12 DIAGNOSIS — C49A0 Gastrointestinal stromal tumor, unspecified site: Secondary | ICD-10-CM

## 2019-09-12 DIAGNOSIS — K219 Gastro-esophageal reflux disease without esophagitis: Secondary | ICD-10-CM

## 2019-09-12 MED ORDER — ATORVASTATIN CALCIUM 80 MG PO TABS: ORAL_TABLET | ORAL | 1 refills | Status: DC

## 2019-09-12 MED ORDER — OMEPRAZOLE 20 MG PO CPDR: 20.0000 mg | DELAYED_RELEASE_CAPSULE | Freq: Every day | ORAL | 1 refills | Status: DC | PRN

## 2019-09-12 MED ORDER — LOSARTAN POTASSIUM 25 MG PO TABS: 25.0000 mg | ORAL_TABLET | Freq: Every day | ORAL | 1 refills | Status: DC

## 2019-09-12 MED ORDER — JANUMET 50-500 MG PO TABS: ORAL_TABLET | ORAL | 1 refills | Status: DC

## 2019-09-12 NOTE — Patient Instructions (Addendum)
Please return another day for labs  Weekdays 8 am to [facility] or 2 pm to 4 pm  No appt needed, come to front office and let them know you need to do labs  Make sure to eat enough calories per day to prevent you from loosing weight. I do suggest Boost or Ensure - or other protein or high calorie drinks - even chocolate milk with added protein - will help you stay healthy.  Protein-Energy Malnutrition Protein-energy malnutrition is when a person does not eat enough protein, fat, and calories. When this happens over time, it can lead to severe loss of muscle tissue (muscle wasting). This condition also affects the body's defense system (immune system) and can lead to other health problems. What are the causes? This condition may be caused by:  Not eating enough protein, fat, or calories.  Having certain chronic medical conditions.  Eating too little. What increases the risk? The following factors may make you more likely to develop this condition:  Living in poverty.  Long-term hospitalization.  Alcohol or drug dependency. Addiction often leads to a lifestyle in which proper diet is ignored. Dependency can also hurt the metabolism and the body's ability to absorb nutrients.  Eating disorders, such as anorexia nervosa or bulimia.  Chewing or swallowing problems. People with these disorders may not eat enough.  Having certain conditions, such as: ? Inflammatory bowel disease. Inflammation of the intestines makes it difficult for the body to absorb nutrients. ? Cancer or AIDS. These diseases can cause a loss of appetite. ? Chronic heart failure. This interferes with how the body uses nutrients. ? Cystic fibrosis. This disease can make it difficult for the body to absorb nutrients.  Eating a diet that extremely restricts protein, fat, or calorie intake. What are the signs or symptoms? Symptoms of this condition include:  Fatigue.  Weakness.  Dizziness.  Fainting.  Weight loss.   Loss of muscle tone and muscle mass.  Poor immune response.  Lack of menstruation.  Poor memory.  Hair loss.  Skin changes. How is this diagnosed? This condition may be diagnosed based on:  Your medical and dietary history.  A physical exam. This may include a measurement of your body mass index (BMI).  Blood tests. How is this treated? This condition may be managed with:  Nutrition therapy. This may include working with a diet and nutrition specialist (dietitian).  Treatment for underlying conditions. People with severe protein-energy malnutrition may need to be treated in a hospital. This may involve receiving nutrition and fluids through an IV. Follow these instructions at home:   Eat a balanced diet. In each meal, include at least one food that is high in protein. Foods that are high in protein include: ? Meat. ? Poultry. ? Fish. ? Eggs. ? Cheese. ? Milk. ? Beans. ? Nuts.  Eat nutrient-rich foods that are easy to swallow and digest, such as: ? Fruit and yogurt smoothies. ? Oatmeal with nut butter.  Try to eat six small meals each day instead of three large meals.  Take vitamin and protein supplements as told by your health care provider or dietitian.  Follow your health care provider's recommendations about exercise and activity.  Keep all follow-up visits as told by your health care provider. This is important. Contact a health care provider if you:  Have increased weakness or fatigue.  Faint.  Are a woman and you stop having your period (menstruating).  Have rapid hair loss.  Have unexpected weight loss.  Have diarrhea.  Have nausea and vomiting. Get help right away if you have:  Difficulty breathing.  Chest pain. Summary  Protein-energy malnutrition is when a person does not eat enough protein, fat, and calories.  Protein-energy malnutrition can lead to severe loss of muscle tissue (muscle wasting). This condition also affects the  body's defense system (immune system) and can lead to other health problems.  Talk with your health care provider about treatment for this condition. Effective treatment depends on the underlying cause of the malnutrition. This information is not intended to replace advice given to you by your health care provider. Make sure you discuss any questions you have with your health care provider. Document Released: 08/22/2005 Document Revised: 10/21/2017 Document Reviewed: 10/21/2017 Elsevier Patient Education  2020 [employer].

## 2019-09-12 NOTE — Progress Notes (Signed)
Name: Rachael Castro   MRN: [id number]    DOB: Mar 06, 1931   Date:09/12/2019       Progress Note  Chief Complaint  Patient presents with   Follow-up   Hyperlipidemia   Hypertension   Diabetes   Gastroesophageal Reflux    pt states famotidine makes her have diarrhea     Subjective:   Rachael Castro is a 85 y.o. female, presents to clinic for routine follow up on the conditions listed above.  Hypertension:  Currently managed on losartan 25 mg daily Pt reports good med compliance and denies any SE.  No lightheadedness, hypotension, syncope. Blood pressure today is well controlled - feel she is at goal and any further aggressive BP tx would have more risk than benefit BP Readings from Last 3 Encounters:  09/12/19 140/62  06/10/19 (!) 142/62  04/06/19 120/64   Pt denies CP, SOB, exertional sx, LE edema, palpitation, Ha's, visual disturbances Dietary efforts for BP?  Low salt   Diabetes Mellitus Type II: Currently managing with janumet 50-500 BID Pt notes good med compliance Pt has no SE from meds. Fasting CBGs typically run around 90-120's.  She only checks  No hypoglycemic episodes Denies: Polyuria, polydipsia, polyphagia, vision changes, or neuropathy  Recent pertinent labs: Lab Results  Component Value Date   HGBA1C 6.5 06/10/2019   HGBA1C 6.5 (H) 12/10/2018   HGBA1C 6.6 (H) 06/09/2018      Component Value Date/Time   NA 142 03/30/2019 1506   NA 144 06/18/2015 1210   NA 137 02/14/2015 1103   K 3.9 03/30/2019 1506   K 4.0 02/14/2015 1103   CL 105 03/30/2019 1506   CL 101 02/14/2015 1103   CO2 27 03/30/2019 1506   CO2 27 02/14/2015 1103   GLUCOSE 84 03/30/2019 1506   GLUCOSE 141 (H) 02/14/2015 1103   BUN 16 03/30/2019 1506   BUN 16 06/18/2015 1210   BUN 17 02/14/2015 1103   CREATININE 0.77 03/30/2019 1506   CREATININE 0.79 12/16/2018 1141   CALCIUM 9.5 03/30/2019 1506   CALCIUM 9.3 02/14/2015 1103   PROT 7.7 03/30/2019 1506   PROT 6.9  06/18/2015 1210   PROT 7.2 02/14/2015 1103   ALBUMIN 4.0 03/30/2019 1506   ALBUMIN 4.5 06/18/2015 1210   ALBUMIN 4.0 02/14/2015 1103   AST 19 03/30/2019 1506   AST 23 02/14/2015 1103   ALT 13 03/30/2019 1506   ALT 14 02/14/2015 1103   ALKPHOS 55 03/30/2019 1506   ALKPHOS 81 02/14/2015 1103   BILITOT 0.3 03/30/2019 1506   BILITOT 0.3 06/18/2015 1210   BILITOT 0.6 02/14/2015 1103   GFRNONAA >60 03/30/2019 1506   GFRNONAA 83 06/08/2017 1032   GFRAA >60 03/30/2019 1506   GFRAA >89 06/08/2017 1032   Current diet: in general, a "healthy" diet   UTD on DM foot exam and eye exam ACEI/ARB: Yes Statin: Yes  Hyperlipidemia:  Current Medication Regimen:  Atorvastatin 80 mg daily Last Lipids: Lab Results  Component Value Date   CHOL 183 12/10/2018   HDL 48 (L) 12/10/2018   LDLCALC 109 (H) 12/10/2018   TRIG 151 (H) 12/10/2018   CHOLHDL 3.8 12/10/2018   - Current Diet: lipitor 80mg  - Denies: Chest pain, shortness of breath, myalgias. - Documented aortic atherosclerosis? No - Risk factors for atherosclerosis: diabetes mellitus, hypercholesterolemia and hypertension  GERD: Not having any current sx, but she did switch back to omeprazole from famotidine - she did not tolerate famotidine due to diarrhea.  She was concerned about long term use of omeprazole, but has not skipped doses and currently denies indigestion, acid reflux, abdominal pain, bloating.    GIST, Sees Oncology in [place] - Just had her CT scan and will follow up next month Denies melena, hematochezia, bowel change, bloating  When asked the patient is eating enough to maintain her weight she states that she believes she has lost some weight and she does not like boost or Ensure.  In reviewing her weights over this year her weight has been fairly stable and there is no concerning unintentional weight loss but we did discuss getting adequate nutrition and calories and every day with high protein or high fat foods or trying  different flavors of Ensure or boost to help  Wt Readings from Last 5 Encounters:  09/12/19 118 lb 11.2 oz (53.8 kg)  06/10/19 115 lb 9.6 oz (52.4 kg)  04/06/19 120 lb (54.4 kg)  03/30/19 119 lb 9.6 oz (54.2 kg)  12/10/18 120 lb 9.6 oz (54.7 kg)       Patient Active Problem List   Diagnosis Date Noted   Osteopenia 12/24/2017   Hypomagnesemia 09/01/2016   Hyperlipidemia 10/23/2015   Type 2 diabetes mellitus ([place]) 10/23/2015   Hypertension 06/18/2015   H/O malignant gastrointestinal stromal tumor (GIST) 05/30/2015   Postherpetic neuralgia 04/17/2015   DDD (degenerative disc disease), cervical 04/17/2015   DDD (degenerative disc disease), lumbar 04/17/2015   Personal history of malignant neoplasm of breast 07/04/2013   History of colonic polyps     Past Surgical History:  Procedure Laterality Date   ABDOMINAL HYSTERECTOMY     APPENDECTOMY     BREAST BIOPSY Left 09/08/2016   Done by Dr. Jamal Collin   BREAST EXCISIONAL BIOPSY Right 1999   Mastectomy   BREAST SURGERY Right 1999   mastectomy   CATARACT EXTRACTION W/PHACO Left 04/30/2015   Procedure: CATARACT EXTRACTION PHACO AND INTRAOCULAR LENS PLACEMENT ([place]);  Surgeon: Estill Cotta, MD;  Location: [facility] ORS;  Service: Ophthalmology;  Laterality: Left;  [place] 01:39 AP% 23.3 CDE 40.42 fluid pack lot # [id number]   COLONOSCOPY  2008   Sankar   EUS  02/12/2012   Procedure: UPPER ENDOSCOPIC ULTRASOUND (EUS) LINEAR;  Surgeon: Milus Banister, MD;  Location: [facility];  Service: Endoscopy;  Laterality: N/A;  radial linear   EYE SURGERY Right 2013   cataract   HERNIA REPAIR  2012   LAPAROTOMY  2013   excision of gastric wall mass    MASTECTOMY     SALPINGOOPHORECTOMY Right 1979   THYROIDECTOMY  1978   TONSILLECTOMY     TUBAL LIGATION     UPPER GI ENDOSCOPY  2013    Family History  Problem Relation Age of Onset   Cancer Mother        cervical   Cancer Father        prostate   Kidney  Stones Brother    Glaucoma Brother    Prostatitis Brother    Diabetes Sister    Thyroid disease Brother    Cancer Brother        thyroid   Arthritis Brother    Diabetes Sister    Kidney disease Neg Hx    Bladder Cancer Neg Hx    Breast cancer Neg Hx     Social History   Socioeconomic History   Marital status: Married    Spouse name: Not on file   Number of children: 1   Years of education: Not  on file   Highest education level: High school graduate  Occupational History   Not on file  Social Needs   Financial resource strain: Not very hard   Food insecurity    Worry: Never true    Inability: Never true   Transportation needs    Medical: No    Non-medical: No  Tobacco Use   Smoking status: Never Smoker   Smokeless tobacco: Never Used  Substance and Sexual Activity   Alcohol use: No   Drug use: No   Sexual activity: Yes  Lifestyle   Physical activity    Days per week: 2 days    Minutes per session: 10 min   Stress: Not at all  Relationships   Social connections    Talks on phone: More than three times a week    Gets together: More than three times a week    Attends religious service: More than 4 times per year    Active member of club or organization: No    Attends meetings of clubs or organizations: Never    Relationship status: Married   Intimate partner violence    Fear of current or ex partner: No    Emotionally abused: No    Physically abused: No    Forced sexual activity: No  Other Topics Concern   Not on file  Social History Narrative   Not on file     Current Outpatient Medications:    atorvastatin (LIPITOR) 80 MG tablet, TAKE 1 TABLET BY MOUTH EVERYDAY AT BEDTIME, Disp: 90 tablet, Rfl: 1   calcium citrate-vitamin D 200-200 MG-UNIT TABS, Take 1 tablet by mouth daily., Disp: , Rfl:    losartan (COZAAR) 25 MG tablet, Take 1 tablet (25 mg total) by mouth daily., Disp: 90 tablet, Rfl: 1   Magnesium 100 MG CAPS,  Take 1 capsule by mouth daily., Disp: , Rfl:    Multiple Vitamin (MULTIVITAMIN) tablet, Take 1 tablet by mouth daily., Disp: , Rfl:    sitaGLIPtin-metformin (JANUMET) 50-500 MG tablet, TAKE 1 TABLET BY MOUTH 2 (TWO) TIMES DAILY WITH A MEAL., Disp: 180 tablet, Rfl: 1   thiamine (VITAMIN B-1) 100 MG tablet, Take 100 mg by mouth daily., Disp: , Rfl:    famotidine (PEPCID) 20 MG tablet, Take 1 tablet (20 mg total) by mouth 2 (two) times daily., Disp: 180 tablet, Rfl: 0   HYDROcodone-acetaminophen (NORCO/VICODIN) 5-325 MG tablet, Take 1 tablet by mouth every 8 (eight) hours., Disp: , Rfl:    omeprazole (PRILOSEC) 20 MG capsule, TAKE 1 CAPSULE BY MOUTH EVERY DAY (Patient not taking: Reported on 09/12/2019), Disp: 90 capsule, Rfl: 1   tizanidine (ZANAFLEX) 2 MG capsule, TAKE 1 CAPSULE (2 MG TOTAL) BY MOUTH AT BEDTIME AS NEEDED FOR MUSCLE SPASMS. (Patient not taking: Reported on 09/12/2019), Disp: 27 capsule, Rfl: 1  No Known Allergies  I personally reviewed active problem list, medication list, allergies, family history, social history, health maintenance, notes from last encounter, lab results, imaging with the patient/caregiver today.  Review of Systems  Constitutional: Negative.   HENT: Negative.   Eyes: Negative.   Respiratory: Negative.   Cardiovascular: Negative.   Gastrointestinal: Negative.   Endocrine: Negative.   Genitourinary: Negative.   Musculoskeletal: Negative.   Skin: Negative.   Allergic/Immunologic: Negative.   Neurological: Negative.   Hematological: Negative.   Psychiatric/Behavioral: Negative.   All other systems reviewed and are negative.    Objective:    Vitals:   09/12/19 0906  BP: 140/62  Pulse: 86  Resp: 14  Temp: 98.3 F (36.8 C)  SpO2: 97%  Weight: 118 lb 11.2 oz (53.8 kg)  Height: 5\' 4"  (1.626 m)    Body mass index is 20.37 kg/m.  Physical Exam Vitals signs and nursing note reviewed.  Constitutional:      General: She is not in acute  distress.    Appearance: Normal appearance. She is well-developed. She is not ill-appearing, toxic-appearing or diaphoretic.     Interventions: Face mask in place.  HENT:     Head: Normocephalic and atraumatic.     Right Ear: External ear normal.     Left Ear: External ear normal.  Eyes:     General: Lids are normal. No scleral icterus.       Right eye: No discharge.        Left eye: No discharge.     Conjunctiva/sclera: Conjunctivae normal.  Neck:     Musculoskeletal: Normal range of motion and neck supple.     Trachea: Phonation normal. No tracheal deviation.  Cardiovascular:     Rate and Rhythm: Normal rate and regular rhythm.     Pulses: Normal pulses.          Radial pulses are 2+ on the right side and 2+ on the left side.       Posterior tibial pulses are 2+ on the right side and 2+ on the left side.     Heart sounds: Normal heart sounds. No murmur. No friction rub. No gallop.   Pulmonary:     Effort: Pulmonary effort is normal. No respiratory distress.     Breath sounds: Normal breath sounds. No stridor. No wheezing, rhonchi or rales.  Chest:     Chest wall: No tenderness.  Abdominal:     General: Bowel sounds are normal. There is no distension.     Palpations: Abdomen is soft.     Tenderness: There is no abdominal tenderness. There is no guarding or rebound.  Musculoskeletal: Normal range of motion.        General: No deformity.     Right lower leg: No edema.     Left lower leg: No edema.  Lymphadenopathy:     Cervical: No cervical adenopathy.  Skin:    General: Skin is warm and dry.     Capillary Refill: Capillary refill takes less than 2 seconds.     Coloration: Skin is not jaundiced or pale.     Findings: No rash.  Neurological:     Mental Status: She is alert and oriented to person, place, and time.     Motor: No abnormal muscle tone.     Gait: Gait normal.  Psychiatric:        Speech: Speech normal.        Behavior: Behavior normal.      No results  found for this or any previous visit (from the past 2160 hour(s)).  Diabetic Foot Exam: Diabetic Foot Exam - Simple   No data filed       PHQ2/9: Depression screen [facility] 2/9 09/12/2019 06/10/2019 04/06/2019 12/10/2018 09/21/2018  Decreased Interest 0 0 0 0 0  Down, Depressed, Hopeless 0 0 0 0 0  PHQ - 2 Score 0 0 0 0 0  Altered sleeping 0 0 0 0 0  Tired, decreased energy 0 0 0 0 0  Change in appetite 0 0 0 0 0  Feeling bad or failure about yourself  0 0 0 0 0  Trouble  concentrating 0 0 0 0 0  Moving slowly or fidgety/restless 0 0 0 0 0  Suicidal thoughts 0 0 0 0 0  PHQ-9 Score 0 0 0 0 0  Difficult doing work/chores Not difficult at all Not difficult at all Not difficult at all Not difficult at all -  Some recent data might be hidden    phq 9 is negative Reviewed today   Fall Risk: Fall Risk  09/12/2019 06/10/2019 04/06/2019 12/10/2018 09/21/2018  Falls in the past year? 0 1 0 0 0  Number falls in past yr: 0 0 0 0 0  Injury with Fall? 0 1 0 0 -  Comment - Right knee-sore - - -  Risk for fall due to : - - - - -  Follow up - - - - -  Comment - - - - -    Functional Status Survey: Is the patient deaf or have difficulty hearing?: No Does the patient have difficulty seeing, even when wearing glasses/contacts?: No Does the patient have difficulty concentrating, remembering, or making decisions?: No Does the patient have difficulty walking or climbing stairs?: No Does the patient have difficulty dressing or bathing?: No Does the patient have difficulty doing errands alone such as visiting a doctor's office or shopping?: No    Assessment & Plan:    1. Type 2 diabetes mellitus without complication, without long-term current use of insulin (HCC) Diabetes has been well controlled, tolerating meds and compliant without any hypoglycemia or hypoglycemia, will return for labs - CMP w GFR - losartan (COZAAR) 25 MG tablet; Take 1 tablet (25 mg total) by mouth daily.  Dispense: 90 tablet;  Refill: 1 - sitaGLIPtin-metformin (JANUMET) 50-500 MG tablet; TAKE 1 TABLET BY MOUTH 2 (TWO) TIMES DAILY WITH A MEAL.  Dispense: 180 tablet; Refill: 1  2. Essential hypertension Blood pressure well controlled for age, stable - CMP w GFR - Lipid Panel - losartan (COZAAR) 25 MG tablet; Take 1 tablet (25 mg total) by mouth daily.  Dispense: 90 tablet; Refill: 1  3. Mixed hyperlipidemia Recheck labs but did discuss at length with the patient with her age she may be able to discontinue medications because they are side effects may outweigh the benefit.  We will get labs and discuss again at her next appointment - CMP w GFR - Lipid Panel - atorvastatin (LIPITOR) 80 MG tablet; TAKE 1 TABLET BY MOUTH EVERYDAY AT BEDTIME  Dispense: 90 tablet; Refill: 1  4. Gastroesophageal reflux disease without esophagitis Did not tolerate Pepcid, refill of Prilosec, encouraged to use as needed if possible, every other day dosing but may use daily if needed  5. Hypomagnesemia We will recheck her magnesium level, she reports as a medication side effect she had hypomagnesium and then was supplementing mag, would like to recheck levels to see if she needs to continue magnesium supplement - Mag  6. Gastrointestinal stromal tumor (GIST) ([place]) Patient does monitoring with oncology - omeprazole (PRILOSEC) 20 MG capsule; Take 1 capsule (20 mg total) by mouth daily as needed.  Dispense: 90 capsule; Refill: 1   Return in about 4 months (around 01/10/2020) for routine f/up, pt wants to reschedule her [place] for with next OV please.   Delsa Grana, PA-C 09/12/19 9:26 AM

## 2019-09-20 ENCOUNTER — Other Ambulatory Visit: Payer: Self-pay

## 2019-09-20 ENCOUNTER — Ambulatory Visit (INDEPENDENT_AMBULATORY_CARE_PROVIDER_SITE_OTHER): Payer: Medicare Other | Admitting: Surgery

## 2019-09-20 ENCOUNTER — Encounter: Payer: Self-pay | Admitting: Surgery

## 2019-09-20 VITALS — BP 140/65 | HR 98 | Temp 97.9°F | Ht 64.0 in | Wt 117.0 lb

## 2019-09-20 DIAGNOSIS — Z9011 Acquired absence of right breast and nipple: Secondary | ICD-10-CM | POA: Diagnosis not present

## 2019-09-20 NOTE — Patient Instructions (Addendum)
You may have your mammograms through your primary care provider or Dr Mike Gip next year.   Continue self breast exams. Call office for any new breast issues or concerns.

## 2019-09-20 NOTE — Progress Notes (Signed)
09/20/2019  History of Present Illness: Rachael Castro is a 85 y.o. female s/p right mastectomy with Dr. Jamal Collin in 1999 for breast cancer.  She's also had a left axillary lymph node biopsy in office for an enlarged lymph node noted on ultrasound, but this was benign.  She presents for follow up.  She denies any issues with the mastectomy site, or issues with the left breast.  She reports that she's having neuropathy issues in the left arm and leg.  Her last mammogram was on 09/08/19.  Past Medical History: Past Medical History:  Diagnosis Date  . Breast cancer ([place]) 1999   right breast  . Diabetes mellitus   . GERD (gastroesophageal reflux disease)   . GIST (gastrointestinal stromal tumor), malignant ([place]) 2013  . History of shingles   . Hypercholesteremia   . Hypertension   . Kidney stones   . 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 Osteopenia 12/24/2017   March 2019; next scan March 2021  . Personal history of chemotherapy   . Personal history of colonic polyps   . Thyroid disease      Past Surgical History: Past Surgical History:  Procedure Laterality Date  . ABDOMINAL HYSTERECTOMY    . APPENDECTOMY    . BREAST BIOPSY Left 09/08/2016   Done by Dr. Jamal Collin  . BREAST EXCISIONAL BIOPSY Right 1999   Mastectomy  . BREAST SURGERY Right 1999   mastectomy  . CATARACT EXTRACTION W/PHACO Left 04/30/2015   Procedure: CATARACT EXTRACTION PHACO AND INTRAOCULAR LENS PLACEMENT ([facility]);  Surgeon: Estill Cotta, MD;  Location: [facility] ORS;  Service: Ophthalmology;  Laterality: Left;  [place] 01:39 AP% 23.3 CDE 40.42 fluid pack lot # [id number]  . COLONOSCOPY  2008   Sankar  . EUS  02/12/2012   Procedure: UPPER ENDOSCOPIC ULTRASOUND (EUS) LINEAR;  Surgeon: Milus Banister, MD;  Location: [facility];  Service: Endoscopy;  Laterality: N/A;  radial linear  . EYE SURGERY Right 2013   cataract  . HERNIA REPAIR  2012  . LAPAROTOMY  2013   excision of gastric wall  mass   . MASTECTOMY    . SALPINGOOPHORECTOMY Right 1979  . THYROIDECTOMY  1978  . TONSILLECTOMY    . TUBAL LIGATION    . UPPER GI ENDOSCOPY  2013    Home Medications: Prior to Admission medications   Medication Sig Start Date End Date Taking? Authorizing Provider  atorvastatin (LIPITOR) 80 MG tablet TAKE 1 TABLET BY MOUTH EVERYDAY AT BEDTIME 09/12/19  Yes Delsa Grana, PA-C  calcium citrate-vitamin D 200-200 MG-UNIT TABS Take 1 tablet by mouth daily.   Yes [provider]  losartan (COZAAR) 25 MG tablet Take 1 tablet (25 mg total) by mouth daily. 09/12/19  Yes Delsa Grana, PA-C  Magnesium 100 MG CAPS Take 1 capsule by mouth daily.   Yes [provider]  Multiple Vitamin (MULTIVITAMIN) tablet Take 1 tablet by mouth daily.   Yes [provider]  omeprazole (PRILOSEC) 20 MG capsule Take 1 capsule (20 mg total) by mouth daily as needed. 09/12/19  Yes Delsa Grana, PA-C  sitaGLIPtin-metformin (JANUMET) 50-500 MG tablet TAKE 1 TABLET BY MOUTH 2 (TWO) TIMES DAILY WITH A MEAL. 09/12/19  Yes Delsa Grana, PA-C  thiamine (VITAMIN B-1) 100 MG tablet Take 100 mg by mouth daily.   Yes [provider]    Allergies: Allergies  Allergen Reactions  . Fish Allergy Rash    Rash and vomiting  Review of Systems: Review of Systems  Constitutional: Negative for chills and fever.  Respiratory: Negative for shortness of breath.   Cardiovascular: Negative for chest pain.  Skin: Negative for rash.    Physical Exam BP 140/65   Pulse 98   Temp 97.9 F (36.6 C)   Ht 5\' 4"  (1.626 m)   Wt 117 lb (53.1 kg)   SpO2 97%   BMI 20.08 kg/m  CONSTITUTIONAL: No acute distress HEENT:  Normocephalic, atraumatic, extraocular motion intact. RESPIRATORY:  Lungs are clear, and breath sounds are equal bilaterally. Normal respiratory effort without pathologic use of accessory muscles. CARDIOVASCULAR: Heart is regular without murmurs, gallops, or rubs. BREAST:  Right  mastectomy scar is well healed. There are no palpable masses, skin changes or wound breakdown.  There is no right axillary or supraclavicular lymphadenopathy.  Left breast without any palpable masses, skin changes, or nipple changes.  There is no left axillary or supraclavicular lymphadenopathy. NEUROLOGIC:  Motor and sensation is grossly normal.  Cranial nerves are grossly intact. PSYCH:  Alert and oriented to person, place and time. Affect is normal.  Labs/Imaging: Left Mammogram 09/08/19: FINDINGS: There are no findings suspicious for malignancy. Images were processed with CAD.  IMPRESSION: No mammographic evidence of malignancy. A result letter of this screening mammogram will be mailed directly to the patient.  RECOMMENDATION: Screening mammogram in one year.  Assessment and Plan: This is a 85 y.o. female s/p right mastectomy  Discussed with the patient that she's been doing well from the surgery standpoint and all her recent mammograms have been negative.  Her exam is reassuring.  At this point, we will defer any further mammograms to the oncology team or her PCP.  Discussed with her that if any issues come up, whether is something that she notices or if it's something on her mammograms, we'd be happy to see her again and be of any help that's needed.  She understands this and is in agreement.  Face-to-face time spent with the patient and care providers was 15 minutes, with more than 50% of the time spent counseling, educating, and coordinating care of the patient.     Melvyn Neth, [facility]

## 2019-09-21 LAB — LIPID PANEL
Cholesterol: 176 mg/dL (ref <200)
HDL: 60 mg/dL (ref >=50)
LDL Cholesterol (Calc): 94 mg/dL (calc)
Non-HDL Cholesterol (Calc): 116 mg/dL (calc) (ref <130)
Total CHOL/HDL Ratio: 2.9 (calc) (ref <5.0)
Triglycerides: 128 mg/dL (ref <150)

## 2019-09-21 LAB — COMPLETE METABOLIC PANEL WITH GFR
AG Ratio: 1.4 (calc) (ref 1.0–2.5)
ALT: 14 U/L (ref 6–29)
AST: 17 U/L (ref 10–35)
Albumin: 4.1 g/dL (ref 3.6–5.1)
Alkaline phosphatase (APISO): 77 U/L (ref 37–153)
BUN: 16 mg/dL (ref 7–25)
CO2: 25 mmol/L (ref 20–32)
Calcium: 9.3 mg/dL (ref 8.6–10.4)
Chloride: 103 mmol/L (ref 98–110)
Creat: 0.73 mg/dL (ref 0.60–0.88)
GFR, Est African American: 86 mL/min/{1.73_m2} (ref >=60)
GFR, Est Non African American: 75 mL/min/{1.73_m2} (ref >=60)
Globulin: 2.9 g/dL (calc) (ref 1.9–3.7)
Glucose, Bld: 181 mg/dL — ABNORMAL HIGH (ref 65–99)
Potassium: 4.2 mmol/L (ref 3.5–5.3)
Sodium: 140 mmol/L (ref 135–146)
Total Bilirubin: 0.3 mg/dL (ref 0.2–1.2)
Total Protein: 7 g/dL (ref 6.1–8.1)

## 2019-09-21 LAB — MAGNESIUM: Magnesium: 1.5 mg/dL (ref 1.5–2.5)

## 2019-09-23 ENCOUNTER — Ambulatory Visit: Payer: Medicare Other

## 2019-09-27 NOTE — Progress Notes (Signed)
[facility]  [address] [place], [place] [address] Phone: [phone]  Fax: [phone]   Telephone Visit:  09/29/2019  Referring physician: Arnetha Courser, MD  I connected with [name] on 09/29/2019 at 2:20 PM by telephone and verified that I was speaking with the correct person using 2 identifiers.  The patient was at home.  I discussed the limitations, risk, security and privacy concerns of performing an evaluation and management service by telephone and the availability of in person appointments.  I also discussed with the patient that there may be a patient responsible charge related to this service.  The patient expressed understanding and agreed to proceed.   Chief Complaint: Rachael Castro is a 85 y.o. female with a history of right breast cancer (1999) and gastrointestinal stromal tumor (2013) who is seen for 6 month assessment.  HPI: The patient was last seen in the medical oncology clinic on 03/30/2019. At that time, she was doing well.  Exam was unremarkable. Hematocrit 32.3. Hemoglobin 10.9. CMP was normal. CA 27.29 was 53.0.  I discussed a follow up mammogram and chest, abdomen, and pelvis CT in 08/2019.  She continued on calcium and vitamin D.   Patient was seen by Suezanne Cheshire, NP on 06/10/2019. She had ongoing neck pain that sometimes radiated to her left finger. She noted some relief with steroids injections. She denied any chest pains, headaches, blurry vision, or dizziness. Patient remained on current regimen.   Chest, abdomen, and pelvis CT on 08/26/2019 revealed no evidence for metastatic disease within the chest, abdomen or pelvis.   She had an initial visit with Dr Orvilla Fus on 08/25/2019. She had a itchy red bumpy rash on her right lateral neck, left antecubital fossa, posterior elbow, and right posterior elbow. She used rubbing alcohol on the rash which gave her major relief at most sites.  She was  diagnosed with pruritic erythematous rash and prescribed topical triamcinolone cream.   Unilateral left mammogram on 09/08/2019 revealed no evidence of malignancy.   Patient had a follow up with Delsa Grana, PA-C on 09/12/2019. She denied any reflux. She was back on omeprazole as famotidine gave her diarrhea. She had no abdominal issues. She noted eating enough to maintain her current weight. She was encouraged to get adequate nutrition and calories every day. She has a follow-up in 4 months.   Patient was seen by Dr. Hampton Abbot on 09/20/2019. She denied any breast issues. She reported neuropathy issues in the left arm and leg.  Further mammograms were deferred to the oncology team or her PCP.   Labs on 09/28/2019: Hematocrit 33.2, hemoglobin 11.2, platelets 247,000, WBC 7,700. BUN 24. CA 27.29 43.4.   During the interim, she had done "well". She notes weight los; her current weight is 118 pounds. She denies any problems with her kidney stones. She notes occasional tingling in her right lower extremity. She has neuralgia in her left shoulder and right knee for which she receives injections. She has one more physical therapy left. She notes seeing Dr. Darcella Cheshire in orthopedics.  For her rash she is using topical cream.  She will no longer see Dr. Hampton Abbot.   Past Medical History:  Diagnosis Date  . Breast cancer ([place]) 1999   right breast  . Diabetes mellitus   . GERD (gastroesophageal reflux disease)   . GIST (gastrointestinal stromal tumor), malignant ([place]) 2013  . History of shingles   . Hypercholesteremia   . Hypertension   .  Kidney stones   . 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 Osteopenia 12/24/2017   March 2019; next scan March 2021  . Personal history of chemotherapy   . Personal history of colonic polyps   . Thyroid disease     Past Surgical History:  Procedure Laterality Date  . ABDOMINAL HYSTERECTOMY    . APPENDECTOMY    . BREAST BIOPSY Left  09/08/2016   Done by Dr. Jamal Collin  . BREAST EXCISIONAL BIOPSY Right 1999   Mastectomy  . BREAST SURGERY Right 1999   mastectomy  . CATARACT EXTRACTION W/PHACO Left 04/30/2015   Procedure: CATARACT EXTRACTION PHACO AND INTRAOCULAR LENS PLACEMENT (IOC);  Surgeon: Estill Cotta, MD;  Location: [facility] ORS;  Service: Ophthalmology;  Laterality: Left;  [place] 01:39 AP% 23.3 CDE 40.42 fluid pack lot # [id number]  . COLONOSCOPY  2008   Sankar  . EUS  02/12/2012   Procedure: UPPER ENDOSCOPIC ULTRASOUND (EUS) LINEAR;  Surgeon: Milus Banister, MD;  Location: [facility] END[facility]COPY;  Service: Endoscopy;  Laterality: N/A;  radial linear  . EYE SURGERY Right 2013   cataract  . HERNIA REPAIR  2012  . LAPAROTOMY  2013   excision of gastric wall mass   . MASTECTOMY    . SALPINGOOPHORECTOMY Right 1979  . THYROIDECTOMY  1978  . TONSILLECTOMY    . TUBAL LIGATION    . UPPER GI ENDOSCOPY  2013    Family History  Problem Relation Age of Onset  . Cancer Mother        cervical  . Cancer Father        prostate  . Kidney Stones Brother   . Glaucoma Brother   . Prostatitis Brother   . Diabetes Sister   . Thyroid disease Brother   . Cancer Brother        thyroid  . Arthritis Brother   . Diabetes Sister   . Kidney disease Neg Hx   . Bladder Cancer Neg Hx   . Breast cancer Neg Hx     Social History:  reports that she has never smoked. She has never used smokeless tobacco. She reports that she does not drink alcohol or use drugs. She lives in [place]. The patient is alone today.  Participants in the patient's visit and their role in the encounter included the patient Vito Berger, CMA, today.  The intake visit was provided by Vito Berger, CMA.  Allergies:  Allergies  Allergen Reactions  . Fish Allergy Rash    Rash and vomiting    Current Medications: Current Outpatient Medications  Medication Sig Dispense Refill  . atorvastatin (LIPITOR) 80 MG tablet TAKE 1 TABLET BY MOUTH EVERYDAY AT  BEDTIME 90 tablet 1  . calcium citrate-vitamin D 200-200 MG-UNIT TABS Take 1 tablet by mouth daily.    Marland Kitchen losartan (COZAAR) 25 MG tablet Take 1 tablet (25 mg total) by mouth daily. 90 tablet 1  . Magnesium 100 MG CAPS Take 1 capsule by mouth daily.    . Multiple Vitamin (MULTIVITAMIN) tablet Take 1 tablet by mouth daily.    Marland Kitchen omeprazole (PRILOSEC) 20 MG capsule Take 1 capsule (20 mg total) by mouth daily as needed. 90 capsule 1  . sitaGLIPtin-metformin (JANUMET) 50-500 MG tablet TAKE 1 TABLET BY MOUTH 2 (TWO) TIMES DAILY WITH A MEAL. 180 tablet 1  . thiamine (VITAMIN B-1) 100 MG tablet Take 100 mg by mouth daily.     No current facility-administered medications for this  visit.    Review of Systems  Constitutional: Positive for weight loss (1 lb since last visit). Negative for chills, diaphoresis, fever and malaise/fatigue.       "Doing well".  HENT: Negative.  Negative for congestion, ear pain, hearing loss, nosebleeds, sinus pain and sore throat.   Eyes: Negative.  Negative for blurred vision, double vision and photophobia.  Respiratory: Negative.  Negative for cough, hemoptysis, sputum production and shortness of breath (exertional).   Cardiovascular: Negative.  Negative for chest pain, palpitations, orthopnea, leg swelling and PND.  Gastrointestinal: Negative.  Negative for abdominal pain, blood in stool, constipation, diarrhea, melena, nausea and vomiting.  Genitourinary: Negative.  Negative for dysuria, frequency, hematuria and urgency.       BILATERAL nephrolithiasis; small and nonobstructing  Musculoskeletal: Positive for joint pain (left shoulder; right knee) and neck pain (known cervical neuralgia). Negative for back pain, falls and myalgias.  Skin: Negative for itching and rash (uses topical cream, improving).  Neurological: Positive for tingling (RIGHT lower extremity; occasional). Negative for dizziness, tremors, speech change, focal weakness, seizures, weakness and headaches.   Endo/Heme/Allergies: Negative.  Does not bruise/bleed easily.  Psychiatric/Behavioral: Negative for depression and memory loss. The patient is not nervous/anxious and does not have insomnia.   All other systems reviewed and are negative.   Performance status (ECOG): 1  Physical Exam  Constitutional: She is oriented to person, place, and time.  Neurological: She is alert and oriented to person, place, and time.  Psychiatric: She has a normal mood and affect. Her behavior is normal. Judgment and thought content normal.  Nursing note reviewed.   Orders Only on 09/28/2019  Component Date Value Ref Range Status  . CA 27.29 09/28/2019 43.4* 0.0 - 38.6 U/mL Final   Comment: (NOTE) Siemens Centaur Immunochemiluminometric Methodology [facility]) Values obtained with different assay methods or kits cannot be used interchangeably. Results cannot be interpreted as absolute evidence of the presence or absence of malignant disease. Performed At: [facility] [place], [place] [id number] Rush Farmer MD [id number]   . Sodium 09/28/2019 139  135 - 145 mmol/L Final  . Potassium 09/28/2019 3.9  3.5 - 5.1 mmol/L Final  . Chloride 09/28/2019 103  98 - 111 mmol/L Final  . CO2 09/28/2019 25  22 - 32 mmol/L Final  . Glucose, Bld 09/28/2019 127* 70 - 99 mg/dL Final  . BUN 09/28/2019 24* 8 - 23 mg/dL Final  . Creatinine, Ser 09/28/2019 0.83  0.44 - 1.00 mg/dL Final  . Calcium 09/28/2019 9.5  8.9 - 10.3 mg/dL Final  . Total Protein 09/28/2019 7.4  6.5 - 8.1 g/dL Final  . Albumin 09/28/2019 4.0  3.5 - 5.0 g/dL Final  . AST 09/28/2019 21  15 - 41 U/L Final  . ALT 09/28/2019 16  0 - 44 U/L Final  . Alkaline Phosphatase 09/28/2019 57  38 - 126 U/L Final  . Total Bilirubin 09/28/2019 0.6  0.3 - 1.2 mg/dL Final  . GFR calc non Af Amer 09/28/2019 >60  >60 mL/min Final  . GFR calc Af Amer 09/28/2019 >60  >60 mL/min Final  . Anion gap 09/28/2019 11  5 - 15 Final   Performed at [facility], [address], [place], [place] [address]  . WBC 09/28/2019 7.7  4.0 - 10.5 K/uL Final  . RBC 09/28/2019 3.47* 3.87 - 5.11 MIL/uL Final  . Hemoglobin 09/28/2019 11.2* 12.0 - 15.0 g/dL Final  . HCT 09/28/2019 33.2* 36.0 - 46.0 %  Final  . MCV 09/28/2019 95.7  80.0 - 100.0 fL Final  . MCH 09/28/2019 32.3  26.0 - 34.0 pg Final  . MCHC 09/28/2019 33.7  30.0 - 36.0 g/dL Final  . RDW 09/28/2019 13.3  11.5 - 15.5 % Final  . Platelets 09/28/2019 247  150 - 400 K/uL Final  . nRBC 09/28/2019 0.0  0.0 - 0.2 % Final  . Neutrophils Relative % 09/28/2019 66  % Final  . Neutro Abs 09/28/2019 5.0  1.7 - 7.7 K/uL Final  . Lymphocytes Relative 09/28/2019 27  % Final  . Lymphs Abs 09/28/2019 2.1  0.7 - 4.0 K/uL Final  . Monocytes Relative 09/28/2019 6  % Final  . Monocytes Absolute 09/28/2019 0.4  0.1 - 1.0 K/uL Final  . Eosinophils Relative 09/28/2019 1  % Final  . Eosinophils Absolute 09/28/2019 0.1  0.0 - 0.5 K/uL Final  . Basophils Relative 09/28/2019 0  % Final  . Basophils Absolute 09/28/2019 0.0  0.0 - 0.1 K/uL Final  . Immature Granulocytes 09/28/2019 0  % Final  . Abs Immature Granulocytes 09/28/2019 0.02  0.00 - 0.07 K/uL Final   Performed at [facility], [address], [place], [place] [address]    Assessment:  Jamere Hallowell is a 85 y.o. female with a history ofright breast cancerstatus post modified radical mastectomy in 1999 followed by 4 cycles of Adriamycin and Cytoxan. She received tamoxifenthen extended adjuvant therapy with Femara.   Left sided mammogramon 07/19/2015 revealed no evidence of malignancy. Left sided mammogram and ultrasoundon 09/02/2016 revealed a borderline lymph node in the left axilla, partially behind the lateral edge of the pectoralis muscle. Left axillary core needle biopsyon 09/08/2016 revealed benign lymphoid hyperplasia. Left screening mammogram on 09/04/2017 that revealed no mammographic evidence of malignancy.  Left  screening mammogram on 09/06/2018 revealed no evidence of malignancy.  Left screening mammogram on 09/08/2019 revealed no evidence of malignancy.   CA27.29was 46.6 on 05/30/2015, 41.9 on 07/09/2015, 42.9 on 11/05/2015, 46.2 on 12/05/2015, 53.5 on 06/04/2016, 50.0 on 08/06/2016, 43.7 on 02/04/2017, 39.2 on 08/05/2017, 44.6 on 02/03/2018, 50.4 on 08/25/2018, 53 on 03/30/2019, and 43.4 on 09/28/2019.  She has a history of gastrointestinal stromal tumor (GIST) status post resection on 02/27/2012. Pathology revealed a 6.4 cm tumor. She received a year of Gleevecbeginning 03/2012. Her dose was reduced secondary to side effects.  Abdominal and pelvis CTscan on 04/17/2015 revealed postsurgical changes related to the prior gastric resection. There was no evidence of recurrent or metastatic disease. There were multiple nonobstructing bilateral renal calculi measuring up to 2 mm. Abdominal and pelvic CTscan on 12/03/2015 revealed a stable exam with no evidence of recurrent disease. There were tiny nonobstructive intrarenal calculi without evidence of hydronephrosis.  Chest, abdomen, and pelvis CTscan with contrast on 06/17/2016 revealed a new solitary mildly enlarged 1.0 cmleft axillary lymph node, nonspecific, nodal metastasis not excluded. There were no additional potential findings of metastatic disease in the chest, abdomen or pelvis. There was no evidence of local tumor recurrence in the stomach.   Chest, abdomen, and pelvis CTon 08/25/2017 demonstrated further enlargement of the LEFT axillary lymph node. Of note, this area was previously biopsied under ultrasound guidance on 08/2016 and found to be benign lymphoid hyperplasia. Axillary node measured 12 mm (previously 10 mm). There was a stable 2 mm lung nodule in the RIGHT middle lobe. There was a 5 mm subcutaneous nodule laterally in the RIGHT chest wall (stable).  Chest, abdomen, and pelvic CT on  08/25/2018 revealed a stable exam. The  left axillary lymph nodes described on previous exam appearedstable to decreased in size in the interval. There was no evidence of metastatic disease within the chest, abdomen or pelvis.Chest, abdomen, and pelvis CT on 08/26/2019 revealed no evidence for metastatic disease.  Bone densityon 12/24/2017 revealed osteopeniawith a T score of -1.8 in the left femoral neck.  Symptomatically, she is doing well.  Plan: 1.   Labs today: CBC with diff, CMP. CA 27.29. 2.  Right breast cancer Clinically, she is doing well. She denies any breast concerns. Left screening mammogram on 09/08/2019 revealed no evidence of malignancy. CA27.29 is 43.4 (improved). Review fluctuating CA27.29.  Prior imaging studies with CA27.29 from 39.2 - 53.5. Overall, CA27.29 is improving. Continue to monitor. 3.  Gastrointestinal stromal cancer Clinically, she is doing well. Chest, abdomen, and pelvis CT on 08/26/2019 revealed no evidence for metastatic disease. Continue to monitor. 4.   Osteopenia Continue calcium and vitamin D. Next bone density due 12/25/2019. 5.   RTC in 6 months for MD assessment and labs (CBC with diff, CMP, CA27.29).  I discussed the assessment and treatment plan with the patient.  The patient was provided an opportunity to ask questions and all were answered.  The patient agreed with the plan and demonstrated an understanding of the instructions.  The patient was advised to call back or seek an in person evaluation if the symptoms worsen or if the condition fails to improve as anticipated.  I provided 15 minutes (2:20 PM - 2:35 PM) of non face-to-face video visit time during this this encounter and > 50% was spent counseling as documented under my assessment and plan.  I provided these services from the [facility] office.   Nolon Stalls, MD, PhD  09/29/2019, 2:20 PM  I, Selena Batten, am acting as scribe for [employer]. Mike Gip, MD, PhD.  I, Melissa C. Mike Gip, MD, have  reviewed the above documentation for accuracy and completeness, and I agree with the above.

## 2019-09-28 ENCOUNTER — Other Ambulatory Visit: Payer: Self-pay

## 2019-09-28 ENCOUNTER — Encounter: Payer: Self-pay | Admitting: Hematology and Oncology

## 2019-09-28 ENCOUNTER — Inpatient Hospital Stay: Payer: Medicare Other | Attending: Hematology and Oncology

## 2019-09-28 DIAGNOSIS — Z85028 Personal history of other malignant neoplasm of stomach: Secondary | ICD-10-CM | POA: Insufficient documentation

## 2019-09-28 DIAGNOSIS — Z853 Personal history of malignant neoplasm of breast: Secondary | ICD-10-CM | POA: Insufficient documentation

## 2019-09-28 DIAGNOSIS — M85852 Other specified disorders of bone density and structure, left thigh: Secondary | ICD-10-CM

## 2019-09-28 DIAGNOSIS — M8580 Other specified disorders of bone density and structure, unspecified site: Secondary | ICD-10-CM | POA: Diagnosis not present

## 2019-09-28 DIAGNOSIS — C49A0 Gastrointestinal stromal tumor, unspecified site: Secondary | ICD-10-CM

## 2019-09-28 LAB — COMPREHENSIVE METABOLIC PANEL
ALT: 16 U/L (ref 0–44)
AST: 21 U/L (ref 15–41)
Albumin: 4 g/dL (ref 3.5–5.0)
Alkaline Phosphatase: 57 U/L (ref 38–126)
Anion gap: 11 (ref 5–15)
BUN: 24 mg/dL — ABNORMAL HIGH (ref 8–23)
CO2: 25 mmol/L (ref 22–32)
Calcium: 9.5 mg/dL (ref 8.9–10.3)
Chloride: 103 mmol/L (ref 98–111)
Creatinine, Ser: 0.83 mg/dL (ref 0.44–1.00)
GFR calc Af Amer: >60 mL/min (ref >60)
GFR calc non Af Amer: >60 mL/min (ref >60)
Glucose, Bld: 127 mg/dL — ABNORMAL HIGH (ref 70–99)
Potassium: 3.9 mmol/L (ref 3.5–5.1)
Sodium: 139 mmol/L (ref 135–145)
Total Bilirubin: 0.6 mg/dL (ref 0.3–1.2)
Total Protein: 7.4 g/dL (ref 6.5–8.1)

## 2019-09-28 LAB — CBC WITH DIFFERENTIAL/PLATELET
Abs Immature Granulocytes: 0.02 10*3/uL (ref 0.00–0.07)
Basophils Absolute: 0 10*3/uL (ref 0.0–0.1)
Basophils Relative: 0 %
Eosinophils Absolute: 0.1 10*3/uL (ref 0.0–0.5)
Eosinophils Relative: 1 %
HCT: 33.2 % — ABNORMAL LOW (ref 36.0–46.0)
Hemoglobin: 11.2 g/dL — ABNORMAL LOW (ref 12.0–15.0)
Immature Granulocytes: 0 %
Lymphocytes Relative: 27 %
Lymphs Abs: 2.1 10*3/uL (ref 0.7–4.0)
MCH: 32.3 pg (ref 26.0–34.0)
MCHC: 33.7 g/dL (ref 30.0–36.0)
MCV: 95.7 fL (ref 80.0–100.0)
Monocytes Absolute: 0.4 10*3/uL (ref 0.1–1.0)
Monocytes Relative: 6 %
Neutro Abs: 5 10*3/uL (ref 1.7–7.7)
Neutrophils Relative %: 66 %
Platelets: 247 10*3/uL (ref 150–400)
RBC: 3.47 MIL/uL — ABNORMAL LOW (ref 3.87–5.11)
RDW: 13.3 % (ref 11.5–15.5)
WBC: 7.7 10*3/uL (ref 4.0–10.5)
nRBC: 0 % (ref 0.0–0.2)

## 2019-09-28 NOTE — Progress Notes (Signed)
No new changes noted today. The patient Name and DOB has been verified by phone today. 

## 2019-09-29 ENCOUNTER — Encounter: Payer: Self-pay | Admitting: Hematology and Oncology

## 2019-09-29 ENCOUNTER — Inpatient Hospital Stay (HOSPITAL_BASED_OUTPATIENT_CLINIC_OR_DEPARTMENT_OTHER): Payer: Medicare Other | Admitting: Hematology and Oncology

## 2019-09-29 ENCOUNTER — Other Ambulatory Visit: Payer: Medicare Other

## 2019-09-29 DIAGNOSIS — Z8509 Personal history of malignant neoplasm of other digestive organs: Secondary | ICD-10-CM | POA: Diagnosis not present

## 2019-09-29 DIAGNOSIS — Z853 Personal history of malignant neoplasm of breast: Secondary | ICD-10-CM

## 2019-09-29 DIAGNOSIS — M85852 Other specified disorders of bone density and structure, left thigh: Secondary | ICD-10-CM | POA: Diagnosis not present

## 2019-09-29 LAB — CANCER ANTIGEN 27.29: CA 27.29: 43.4 U/mL — ABNORMAL HIGH (ref 0.0–38.6)

## 2019-09-30 ENCOUNTER — Ambulatory Visit: Payer: Medicare Other

## 2019-10-09 NOTE — Addendum Note (Signed)
Addended by: Nolon Stalls C on: 10/09/2019 10:57 PM   Modules accepted: Level of Service

## 2019-10-28 ENCOUNTER — Ambulatory Visit (INDEPENDENT_AMBULATORY_CARE_PROVIDER_SITE_OTHER): Payer: Medicare Other

## 2019-10-28 ENCOUNTER — Other Ambulatory Visit: Payer: Self-pay

## 2019-10-28 VITALS — Temp 96.7°F | Ht 64.0 in | Wt 112.5 lb

## 2019-10-28 DIAGNOSIS — Z780 Asymptomatic menopausal state: Secondary | ICD-10-CM

## 2019-10-28 DIAGNOSIS — M8580 Other specified disorders of bone density and structure, unspecified site: Secondary | ICD-10-CM

## 2019-10-28 DIAGNOSIS — Z0000 Encounter for general adult medical examination without abnormal findings: Secondary | ICD-10-CM | POA: Diagnosis not present

## 2019-10-28 NOTE — Patient Instructions (Signed)
Rachael Castro , Thank you for taking time to come for your Medicare Wellness Visit. I appreciate your ongoing commitment to your health goals. Please review the following plan we discussed and let me know if I can assist you in the future.   Screening recommendations/referrals: Colonoscopy: done 07/13/13 Mammogram: done 09/08/19 Bone Density: done 12/24/17. Please call [phone] to schedule your bone density screening.  Recommended yearly ophthalmology/optometry visit for glaucoma screening and checkup Recommended yearly dental visit for hygiene and checkup  Vaccinations: Influenza vaccine: done 06/10/19 Pneumococcal vaccine: done 11/24/13 Tdap vaccine: done 04/26/12 Shingles vaccine: done 01/05/19 due for second dose at pharmacy    Advanced directives: Please bring a copy of your health care power of attorney and living will to the office at your convenience.  Conditions/risks identified: Keep up the great work!  Next appointment: Please follow up in one year for your Medicare Annual Wellness visit.     Preventive Care 33 Years and Older, Female Preventive care refers to lifestyle choices and visits with your health care provider that can promote health and wellness. What does preventive care include?  A yearly physical exam. This is also called an annual well check.  Dental exams once or twice a year.  Routine eye exams. Ask your health care provider how often you should have your eyes checked.  Personal lifestyle choices, including:  Daily care of your teeth and gums.  Regular physical activity.  Eating a healthy diet.  Avoiding tobacco and drug use.  Limiting alcohol use.  Practicing safe sex.  Taking low-dose aspirin every day.  Taking vitamin and mineral supplements as recommended by your health care provider. What happens during an annual well check? The services and screenings done by your health care provider during your annual well check will depend on your age,  overall health, lifestyle risk factors, and family history of disease. Counseling  Your health care provider may ask you questions about your:  Alcohol use.  Tobacco use.  Drug use.  Emotional well-being.  Home and relationship well-being.  Sexual activity.  Eating habits.  History of falls.  Memory and ability to understand (cognition).  Work and work Statistician.  Reproductive health. Screening  You may have the following tests or measurements:  Height, weight, and BMI.  Blood pressure.  Lipid and cholesterol levels. These may be checked every 5 years, or more frequently if you are over 49 years old.  Skin check.  Lung cancer screening. You may have this screening every year starting at age 55 if you have a 30-pack-year history of smoking and currently smoke or have quit within the past 15 years.  Fecal occult blood test (FOBT) of the stool. You may have this test every year starting at age 28.  Flexible sigmoidoscopy or colonoscopy. You may have a sigmoidoscopy every 5 years or a colonoscopy every 10 years starting at age 34.  Hepatitis C blood test.  Hepatitis B blood test.  Sexually transmitted disease (STD) testing.  Diabetes screening. This is done by checking your blood sugar (glucose) after you have not eaten for a while (fasting). You may have this done every 1-3 years.  Bone density scan. This is done to screen for osteoporosis. You may have this done starting at age 53.  Mammogram. This may be done every 1-2 years. Talk to your health care provider about how often you should have regular mammograms. Talk with your health care provider about your test results, treatment options, and if necessary, the  need for more tests. Vaccines  Your health care provider may recommend certain vaccines, such as:  Influenza vaccine. This is recommended every year.  Tetanus, diphtheria, and acellular pertussis (Tdap, Td) vaccine. You may need a Td booster every 10  years.  Zoster vaccine. You may need this after age 60.  Pneumococcal 13-valent conjugate (PCV13) vaccine. One dose is recommended after age 61.  Pneumococcal polysaccharide (PPSV23) vaccine. One dose is recommended after age 12. Talk to your health care provider about which screenings and vaccines you need and how often you need them. This information is not intended to replace advice given to you by your health care provider. Make sure you discuss any questions you have with your health care provider. Document Released: 11/02/2015 Document Revised: 06/25/2016 Document Reviewed: 08/07/2015 Elsevier Interactive Patient Education  2017 [place] Prevention in the Home Falls can cause injuries. They can happen to people of all ages. There are many things you can do to make your home safe and to help prevent falls. What can I do on the outside of my home?  Regularly fix the edges of walkways and driveways and fix any cracks.  Remove anything that might make you trip as you walk through a door, such as a raised step or threshold.  Trim any bushes or trees on the path to your home.  Use bright outdoor lighting.  Clear any walking paths of anything that might make someone trip, such as rocks or tools.  Regularly check to see if handrails are loose or broken. Make sure that both sides of any steps have handrails.  Any raised decks and porches should have guardrails on the edges.  Have any leaves, snow, or ice cleared regularly.  Use sand or salt on walking paths during winter.  Clean up any spills in your garage right away. This includes oil or grease spills. What can I do in the bathroom?  Use night lights.  Install grab bars by the toilet and in the tub and shower. Do not use towel bars as grab bars.  Use non-skid mats or decals in the tub or shower.  If you need to sit down in the shower, use a plastic, non-slip stool.  Keep the floor dry. Clean up any water that  spills on the floor as soon as it happens.  Remove soap buildup in the tub or shower regularly.  Attach bath mats securely with double-sided non-slip rug tape.  Do not have throw rugs and other things on the floor that can make you trip. What can I do in the bedroom?  Use night lights.  Make sure that you have a light by your bed that is easy to reach.  Do not use any sheets or blankets that are too big for your bed. They should not hang down onto the floor.  Have a firm chair that has side arms. You can use this for support while you get dressed.  Do not have throw rugs and other things on the floor that can make you trip. What can I do in the kitchen?  Clean up any spills right away.  Avoid walking on wet floors.  Keep items that you use a lot in easy-to-reach places.  If you need to reach something above you, use a strong step stool that has a grab bar.  Keep electrical cords out of the way.  Do not use floor polish or wax that makes floors slippery. If you must use wax,  use non-skid floor wax.  Do not have throw rugs and other things on the floor that can make you trip. What can I do with my stairs?  Do not leave any items on the stairs.  Make sure that there are handrails on both sides of the stairs and use them. Fix handrails that are broken or loose. Make sure that handrails are as long as the stairways.  Check any carpeting to make sure that it is firmly attached to the stairs. Fix any carpet that is loose or worn.  Avoid having throw rugs at the top or bottom of the stairs. If you do have throw rugs, attach them to the floor with carpet tape.  Make sure that you have a light switch at the top of the stairs and the bottom of the stairs. If you do not have them, ask someone to add them for you. What else can I do to help prevent falls?  Wear shoes that:  Do not have high heels.  Have rubber bottoms.  Are comfortable and fit you well.  Are closed at the  toe. Do not wear sandals.  If you use a stepladder:  Make sure that it is fully opened. Do not climb a closed stepladder.  Make sure that both sides of the stepladder are locked into place.  Ask someone to hold it for you, if possible.  Clearly mark and make sure that you can see:  Any grab bars or handrails.  First and last steps.  Where the edge of each step is.  Use tools that help you move around (mobility aids) if they are needed. These include:  Canes.  Walkers.  Scooters.  Crutches.  Turn on the lights when you go into a dark area. Replace any light bulbs as soon as they burn out.  Set up your furniture so you have a clear path. Avoid moving your furniture around.  If any of your floors are uneven, fix them.  If there are any pets around you, be aware of where they are.  Review your medicines with your doctor. Some medicines can make you feel dizzy. This can increase your chance of falling. Ask your doctor what other things that you can do to help prevent falls. This information is not intended to replace advice given to you by your health care provider. Make sure you discuss any questions you have with your health care provider. Document Released: 08/02/2009 Document Revised: 03/13/2016 Document Reviewed: 11/10/2014 Elsevier Interactive Patient Education  2017 [employer].

## 2019-10-28 NOTE — Progress Notes (Signed)
Subjective:   Rachael Castro is a 86 y.o. female who presents for Medicare Annual (Subsequent) preventive examination.  Virtual Visit via Telephone Note  I connected with Rachael Castro on 10/28/19 at 10:00 AM EST by telephone and verified that I am speaking with the correct person using two identifiers.  Medicare Annual Wellness visit completed telephonically due to Covid-19 pandemic.   Location: Patient: home Provider: office   I discussed the limitations, risks, security and privacy concerns of performing an evaluation and management service by telephone and the availability of in person appointments. The patient expressed understanding and agreed to proceed.  Some vital signs may be absent or patient reported.   Clemetine Marker, LPN    Review of Systems:   Cardiac Risk Factors include: advanced age (>34men, >44 women);diabetes mellitus;dyslipidemia;hypertension     Objective:     Vitals: Temp (!) 96.7 F (35.9 C)   Ht 5\' 4"  (1.626 m)   Wt 112 lb 8 oz (51 kg)   BMI 19.31 kg/m   Body mass index is 19.31 kg/m.  Advanced Directives 10/28/2019 09/28/2019 03/30/2019 09/21/2018 09/01/2018 08/07/2017 08/05/2017  Does Patient Have a Medical Advance Directive? Yes No No No No No No  Type of Paramedic of [place];Living will - - - - - -  Copy of [place] in Chart? No - copy requested - - - - - -  Would patient like information on creating a medical advance directive? - No - Patient declined No - Patient declined Yes (MAU/Ambulatory/Procedural Areas - Information given) No - Patient declined - No - Patient declined  Pre-existing out of facility DNR order (yellow form or pink MOST form) - - - - - - -    Tobacco Social History   Tobacco Use  Smoking Status Never Smoker  Smokeless Tobacco Never Used     Counseling given: Not Answered   Clinical Intake:  Pre-visit preparation completed: Yes  Pain : No/denies pain     BMI  - recorded: 19.31 Nutritional Risks: None Diabetes: Yes CBG done?: No Did pt. bring in CBG monitor from home?: No   Nutrition Risk Assessment:  Has the patient had any N/V/D within the last 2 months?  No  Does the patient have any non-healing wounds?  No  Has the patient had any unintentional weight loss or weight gain?  No   Diabetes:  Is the patient diabetic?  Yes  If diabetic, was a CBG obtained today?  No  Did the patient bring in their glucometer from home?  No  How often do you monitor your CBG's? Several times per week.   Financial Strains and Diabetes Management:  Are you having any financial strains with the device, your supplies or your medication? No .  Does the patient want to be seen by Chronic Care Management for management of their diabetes?  No  Would the patient like to be referred to a Nutritionist or for Diabetic Management?  No   Diabetic Exams:  Diabetic Eye Exam: Completed 01/05/19 negative retinopathy.   Diabetic Foot Exam: Completed 12/10/18.   How often do you need to have someone help you when you read instructions, pamphlets, or other written materials from your doctor or pharmacy?: 1 - Never  Interpreter Needed?: No  Information entered by :: Clemetine Marker LPN  Past Medical History:  Diagnosis Date  . Breast cancer ([place]) 1999   right breast  . Diabetes mellitus   . GERD (  gastroesophageal reflux disease)   . GIST (gastrointestinal stromal tumor), malignant ([place]) 2013  . History of shingles   . Hypercholesteremia   . Hypertension   . Kidney stones   . 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 Osteopenia 12/24/2017   March 2019; next scan March 2021  . Personal history of chemotherapy   . Personal history of colonic polyps   . Thyroid disease    Past Surgical History:  Procedure Laterality Date  . ABDOMINAL HYSTERECTOMY    . APPENDECTOMY    . BREAST BIOPSY Left 09/08/2016   Done by Dr. Jamal Collin  . BREAST  EXCISIONAL BIOPSY Right 1999   Mastectomy  . BREAST SURGERY Right 1999   mastectomy  . CATARACT EXTRACTION W/PHACO Left 04/30/2015   Procedure: CATARACT EXTRACTION PHACO AND INTRAOCULAR LENS PLACEMENT (IOC);  Surgeon: Estill Cotta, MD;  Location: [facility] ORS;  Service: Ophthalmology;  Laterality: Left;  [place] 01:39 AP% 23.3 CDE 40.42 fluid pack lot # [id number]  . COLONOSCOPY  2008   Sankar  . EUS  02/12/2012   Procedure: UPPER ENDOSCOPIC ULTRASOUND (EUS) LINEAR;  Surgeon: Milus Banister, MD;  Location: [facility];  Service: Endoscopy;  Laterality: N/A;  radial linear  . EYE SURGERY Right 2013   cataract  . HERNIA REPAIR  2012  . LAPAROTOMY  2013   excision of gastric wall mass   . MASTECTOMY    . SALPINGOOPHORECTOMY Right 1979  . THYROIDECTOMY  1978  . TONSILLECTOMY    . TUBAL LIGATION    . UPPER GI ENDOSCOPY  2013   Family History  Problem Relation Age of Onset  . Cancer Mother        cervical  . Cancer Father        prostate  . Kidney Stones Brother   . Glaucoma Brother   . Prostatitis Brother   . Diabetes Sister   . Thyroid disease Brother   . Cancer Brother        thyroid  . Arthritis Brother   . Diabetes Sister   . Kidney disease Neg Hx   . Bladder Cancer Neg Hx   . Breast cancer Neg Hx    Social History   Socioeconomic History  . Marital status: Married    Spouse name: Not on file  . Number of children: 1  . Years of education: Not on file  . Highest education level: High school graduate  Occupational History  . Not on file  Tobacco Use  . Smoking status: Never Smoker  . Smokeless tobacco: Never Used  Substance and Sexual Activity  . Alcohol use: No  . Drug use: No  . Sexual activity: Yes  Other Topics Concern  . Not on file  Social History Narrative  . Not on file   Social Determinants of Health   Financial Resource Strain: Low Risk   . Difficulty of Paying Living Expenses: Not hard at all  Food Insecurity: No Food Insecurity  . Worried  About Charity fundraiser in the Last Year: Never true  . Ran Out of Food in the Last Year: Never true  Transportation Needs: No Transportation Needs  . Lack of Transportation (Medical): No  . Lack of Transportation (Non-Medical): No  Physical Activity: Insufficiently Active  . Days of Exercise per Week: 2 days  . Minutes of Exercise per Session: 10 min  Stress: No Stress Concern Present  . Feeling of Stress : Only a little  Social Connections: Slightly Isolated  . Frequency of Communication with Friends and Family: More than three times a week  . Frequency of Social Gatherings with Friends and Family: More than three times a week  . Attends Religious Services: More than 4 times per year  . Active Member of Clubs or Organizations: No  . Attends Archivist Meetings: Never  . Marital Status: Married    Outpatient Encounter Medications as of 10/28/2019  Medication Sig  . atorvastatin (LIPITOR) 80 MG tablet TAKE 1 TABLET BY MOUTH EVERYDAY AT BEDTIME  . calcium citrate-vitamin D 200-200 MG-UNIT TABS Take 1 tablet by mouth daily.  Marland Kitchen losartan (COZAAR) 25 MG tablet Take 1 tablet (25 mg total) by mouth daily.  . Magnesium 100 MG CAPS Take 1 capsule by mouth daily.  . Multiple Vitamin (MULTIVITAMIN) tablet Take 1 tablet by mouth daily.  Marland Kitchen omeprazole (PRILOSEC) 20 MG capsule Take 1 capsule (20 mg total) by mouth daily as needed.  . sitaGLIPtin-metformin (JANUMET) 50-500 MG tablet TAKE 1 TABLET BY MOUTH 2 (TWO) TIMES DAILY WITH A MEAL.  Marland Kitchen thiamine (VITAMIN B-1) 100 MG tablet Take 100 mg by mouth daily.   No facility-administered encounter medications on file as of 10/28/2019.    Activities of Daily Living In your present state of health, do you have any difficulty performing the following activities: 10/28/2019 09/12/2019  Hearing? N N  Vision? N N  Difficulty concentrating or making decisions? N N  Walking or climbing stairs? N N  Dressing or bathing? N N  Doing errands, shopping? N  N  Preparing Food and eating ? N -  Using the Toilet? N -  In the past six months, have you accidently leaked urine? N -  Do you have problems with loss of bowel control? N -  Managing your Medications? N -  Managing your Finances? N -  Housekeeping or managing your Housekeeping? N -  Some recent data might be hidden    Patient Care Team: Delsa Grana, PA-C as PCP - General (Family Medicine) Lada, Satira Anis, MD as Attending Physician (Family Medicine) Lequita Asal, MD as Referring Physician (Hematology and Oncology) Deboraha Sprang, MD as Consulting Physician (Cardiology) Laneta Simmers as Physician Assistant (Urology) Bary Castilla, Forest Gleason, MD (General Surgery) Virgel Manifold, MD as Consulting Physician (Gastroenterology)    Assessment:   This is a routine wellness examination for [place].  Exercise Activities and Dietary recommendations Current Exercise Habits: Home exercise routine, Type of exercise: stretching;walking, Time (Minutes): 10, Frequency (Times/Week): 2, Weekly Exercise (Minutes/Week): 20, Intensity: Mild, Exercise limited by: None identified  Goals    . Increase physical activity     Increase physical activity to 90 minutes per week.    . Prevent falls       Fall Risk Fall Risk  10/28/2019 09/20/2019 09/12/2019 06/10/2019 04/06/2019  Falls in the past year? 0 0 0 1 0  Number falls in past yr: 0 0 0 0 0  Injury with Fall? 0 0 0 1 0  Comment - - - Right knee-sore -  Risk for fall due to : No Fall Risks - - - -  Follow up Falls prevention discussed - - - -  Comment - - - - -   FALL RISK PREVENTION PERTAINING TO THE HOME:  Any stairs in or around the home? Yes  If so, do they handrails? No  - 2 steps outside Home free of loose throw rugs in walkways, pet  beds, electrical cords, etc? Yes  Adequate lighting in your home to reduce risk of falls? Yes   ASSISTIVE DEVICES UTILIZED TO PREVENT FALLS:  Life alert? No  Use of a cane, walker or w/c?  No  Grab bars in the bathroom? Yes  Shower chair or bench in shower? Yes  Elevated toilet seat or a handicapped toilet? Yes   DME ORDERS:  DME order needed?  No   TIMED UP AND GO:  Was the test performed? No . Telephonic visit.   Education: Fall risk prevention has been discussed.  Intervention(s) required? No    Depression Screen PHQ 2/9 Scores 10/28/2019 09/12/2019 06/10/2019 04/06/2019  PHQ - 2 Score 0 0 0 0  PHQ- 9 Score - 0 0 0  Exception Documentation - - - -     Cognitive Function     6CIT Screen 10/28/2019 09/21/2018 09/21/2017  What Year? 0 points 0 points 0 points  What month? 0 points 0 points 0 points  What time? 0 points 0 points 0 points  Count back from 20 0 points 0 points 0 points  Months in reverse 0 points 0 points 0 points  Repeat phrase 2 points 2 points 2 points  Total Score 2 2 2     Immunization History  Administered Date(s) Administered  . Fluad Quad(high Dose 65+) 06/10/2019  . Influenza, High Dose Seasonal PF 06/18/2015, 09/04/2016  . Influenza-Unspecified 07/23/2017, 06/02/2018  . Zoster Recombinat (Shingrix) 01/05/2019    Qualifies for Shingles Vaccine? Yes  due for second dose of Shingrix. Pt states pharmacy has been out of stock.   Tdap: Up to date  Flu Vaccine: Up to date  Pneumococcal Vaccine: Up to date  Screening Tests Health Maintenance  Topic Date Due  . COLONOSCOPY  07/13/2018  . FOOT EXAM  12/11/2019  . HEMOGLOBIN A1C  12/11/2019  . DEXA SCAN  12/25/2019  . OPHTHALMOLOGY EXAM  01/05/2020  . MAMMOGRAM  09/07/2020  . TETANUS/TDAP  04/26/2022  . INFLUENZA VACCINE  Completed  . PNA vac Low Risk Adult  Completed    Cancer Screenings:  Colorectal Screening: Completed 07/13/13.  No longer required.   Mammogram: Completed 09/08/19. Repeat every year.  Bone Density: Completed 12/24/17. Results reflect  OSTEOPENIA. Repeat every 2 years.   Lung Cancer Screening: (Low Dose CT Chest recommended if Age 55-80 years, 30 pack-year  currently smoking OR have quit w/in 15years.) does not qualify.    Additional Screening:  Hepatitis C Screening: no loner required  Vision Screening: Recommended annual ophthalmology exams for early detection of glaucoma and other disorders of the eye. Is the patient up to date with their annual eye exam?  Yes  Who is the provider or what is the name of the office in which the pt attends annual eye exams? [place] Screening: Recommended annual dental exams for proper oral hygiene  Community Resource Referral:  CRR required this visit?  No      Plan:    I have personally reviewed and addressed the Medicare Annual Wellness questionnaire and have noted the following in the patient's chart:  A. Medical and social history B. Use of alcohol, tobacco or illicit drugs  C. Current medications and supplements D. Functional ability and status E.  Nutritional status F.  Physical activity G. Advance directives H. List of other physicians I.  Hospitalizations, surgeries, and ER visits in previous 12 months J.  [place] such as hearing and vision if needed, cognitive  and depression L. Referrals and appointments   In addition, I have reviewed and discussed with patient certain preventive protocols, quality metrics, and best practice recommendations. A written personalized care plan for preventive services as well as general preventive health recommendations were provided to patient.   Signed,  Clemetine Marker, LPN Nurse Health Advisor   Nurse Notes: none

## 2019-11-02 ENCOUNTER — Encounter: Payer: Self-pay | Admitting: Family Medicine

## 2020-01-10 ENCOUNTER — Ambulatory Visit: Payer: Medicare Other | Admitting: Family Medicine

## 2020-01-10 ENCOUNTER — Encounter: Payer: Self-pay | Admitting: Family Medicine

## 2020-01-10 ENCOUNTER — Other Ambulatory Visit: Payer: Self-pay

## 2020-01-10 VITALS — BP 136/70 | HR 80 | Temp 98.5°F | Resp 14 | Ht 64.0 in | Wt 120.2 lb

## 2020-01-10 DIAGNOSIS — E119 Type 2 diabetes mellitus without complications: Secondary | ICD-10-CM

## 2020-01-10 DIAGNOSIS — C49A0 Gastrointestinal stromal tumor, unspecified site: Secondary | ICD-10-CM

## 2020-01-10 DIAGNOSIS — E782 Mixed hyperlipidemia: Secondary | ICD-10-CM

## 2020-01-10 DIAGNOSIS — I10 Essential (primary) hypertension: Secondary | ICD-10-CM | POA: Diagnosis not present

## 2020-01-10 DIAGNOSIS — K219 Gastro-esophageal reflux disease without esophagitis: Secondary | ICD-10-CM

## 2020-01-10 DIAGNOSIS — M85852 Other specified disorders of bone density and structure, left thigh: Secondary | ICD-10-CM

## 2020-01-10 LAB — POCT GLYCOSYLATED HEMOGLOBIN (HGB A1C): Hemoglobin A1C: 6.4 % — AB (ref 4.0–5.6)

## 2020-01-10 MED ORDER — ATORVASTATIN CALCIUM 80 MG PO TABS: ORAL_TABLET | ORAL | 3 refills | Status: DC

## 2020-01-10 MED ORDER — LOSARTAN POTASSIUM 25 MG PO TABS: 25.0000 mg | ORAL_TABLET | Freq: Every day | ORAL | 3 refills | Status: DC

## 2020-01-10 MED ORDER — JANUMET 50-500 MG PO TABS: ORAL_TABLET | ORAL | 3 refills | Status: DC

## 2020-01-10 NOTE — Progress Notes (Signed)
Name: Rachael Castro   MRN: [id number]    DOB: 06/29/1931   Date:01/10/2020       Progress Note  Chief Complaint  Patient presents with  . Follow-up  . Diabetes  . Hypertension  . Hyperlipidemia  . Gastroesophageal Reflux     Subjective:   Rachael Castro is a 86 y.o. female, presents to clinic for routine follow up on the conditions listed above.  GERD- she is concered about being on omeprazole long term, but she had HA's and diarrhea when she tried to switch to pepcid last year.  Currently she is taking omeprazole 20 mg daily, she has no GI sx, no reflux, dysphagia, indigestion, bloating, change in stool, only occasionally will have heartburn with certain food triggers  Diabetes Mellitus Type II: Hx of being stable and well controlled for the past 2 years Currently managing with janumet BID Pt notes good med compliance Pt has no SE from meds. Fasting CBGs typically run 100-123 No hypoglycemic episodes Denies: Polyuria, polydipsia, polyphagia, vision changes, or neuropathy-  Recent pertinent labs: Lab Results  Component Value Date   HGBA1C 6.5 06/10/2019   HGBA1C 6.5 (H) 12/10/2018   HGBA1C 6.6 (H) 06/09/2018  Pt is due today for DM foot exam and due for eye exam- going to the eye doctor today  ACEI/ARB: Yes Statin: Yes  Pt recently had f/up with general surgery for f/up on s/p mastectomy and GI cancer with repeat CEA  - visits and labs reviewed  Hx of anemia, Hgb ranges 10.9-12.3, fluctuates, last labs 2 months ago which were reviewed today with the pt.  Hypertension:  Currently managed on losartan 25 mg Pt reports good med compliance and denies any SE.  No lightheadedness, hypotension, syncope. Blood pressure today is well controlled. BP Readings from Last 3 Encounters:  01/10/20 136/70  09/20/19 140/65  09/12/19 140/62   Pt denies CP, SOB, exertional sx, LE edema, palpitation, Ha's, visual disturbances  Hyperlipidemia: Current Medication Regimen:  Lipitor 80 mg she is compliant with meds takes daily at bedtime no concerns or side effects last labs were done only 3 months ago, cholesterol is well controlled and liver function was normal Last Lipids: Lab Results  Component Value Date   CHOL 176 09/20/2019   HDL 60 09/20/2019   LDLCALC 94 09/20/2019   TRIG 128 09/20/2019   CHOLHDL 2.9 09/20/2019   - Denies: Chest pain, shortness of breath, myalgias. - Risk factors for atherosclerosis: diabetes mellitus, hypercholesterolemia and hypertension  Patient is due for her follow-up bone density test last was done 307 2019, reviewed today with the patient.  She is osteopenic to left femur she is on vitamin D and calcium supplements  Patient Active Problem List   Diagnosis Date Noted  . Impingement syndrome of shoulder region 05/16/2019  . Osteopenia 12/24/2017  . Hypomagnesemia 09/01/2016  . Hyperlipidemia 10/23/2015  . Type 2 diabetes mellitus ([place]) 10/23/2015  . Hypertension 06/18/2015  . H/O malignant gastrointestinal stromal tumor (GIST) 05/30/2015  . Postherpetic neuralgia 04/17/2015  . DDD (degenerative disc disease), lumbar 04/17/2015  . DDD (degenerative disc disease), lumbar 04/17/2015  . Personal history of malignant neoplasm of breast 07/04/2013  . History of colonic polyps     Past Surgical History:  Procedure Laterality Date  . ABDOMINAL HYSTERECTOMY    . APPENDECTOMY    . BREAST BIOPSY Left 09/08/2016   Done by Dr. Jamal Collin  . BREAST EXCISIONAL BIOPSY Right 1999   Mastectomy  . BREAST SURGERY  Right 1999   mastectomy  . CATARACT EXTRACTION W/PHACO Left 04/30/2015   Procedure: CATARACT EXTRACTION PHACO AND INTRAOCULAR LENS PLACEMENT (IOC);  Surgeon: Estill Cotta, MD;  Location: [facility] ORS;  Service: Ophthalmology;  Laterality: Left;  [place] 01:39 AP% 23.3 CDE 40.42 fluid pack lot # [id number]  . COLONOSCOPY  2008   Sankar  . EUS  02/12/2012   Procedure: UPPER ENDOSCOPIC ULTRASOUND (EUS) LINEAR;  Surgeon: Milus Banister, MD;  Location: [facility];  Service: Endoscopy;  Laterality: N/A;  radial linear  . EYE SURGERY Right 2013   cataract  . HERNIA REPAIR  2012  . LAPAROTOMY  2013   excision of gastric wall mass   . MASTECTOMY    . SALPINGOOPHORECTOMY Right 1979  . THYROIDECTOMY  1978  . TONSILLECTOMY    . TUBAL LIGATION    . UPPER GI ENDOSCOPY  2013    Family History  Problem Relation Age of Onset  . Cancer Mother        cervical  . Cancer Father        prostate  . Kidney Stones Brother   . Glaucoma Brother   . Prostatitis Brother   . Diabetes Sister   . Thyroid disease Brother   . Cancer Brother        thyroid  . Arthritis Brother   . Diabetes Sister   . Kidney disease Neg Hx   . Bladder Cancer Neg Hx   . Breast cancer Neg Hx     Social History   Tobacco Use  . Smoking status: Never Smoker  . Smokeless tobacco: Never Used  Substance Use Topics  . Alcohol use: No  . Drug use: No      Current Outpatient Medications:  .  atorvastatin (LIPITOR) 80 MG tablet, TAKE 1 TABLET BY MOUTH EVERYDAY AT BEDTIME, Disp: 90 tablet, Rfl: 1 .  calcium citrate-vitamin D 200-200 MG-UNIT TABS, Take 1 tablet by mouth daily., Disp: , Rfl:  .  losartan (COZAAR) 25 MG tablet, Take 1 tablet (25 mg total) by mouth daily., Disp: 90 tablet, Rfl: 1 .  Magnesium 100 MG CAPS, Take 1 capsule by mouth daily., Disp: , Rfl:  .  Multiple Vitamin (MULTIVITAMIN) tablet, Take 1 tablet by mouth daily., Disp: , Rfl:  .  omeprazole (PRILOSEC) 20 MG capsule, Take 1 capsule (20 mg total) by mouth daily as needed., Disp: 90 capsule, Rfl: 1 .  sitaGLIPtin-metformin (JANUMET) 50-500 MG tablet, TAKE 1 TABLET BY MOUTH 2 (TWO) TIMES DAILY WITH A MEAL., Disp: 180 tablet, Rfl: 1 .  thiamine (VITAMIN B-1) 100 MG tablet, Take 100 mg by mouth daily., Disp: , Rfl:   Allergies  Allergen Reactions  . Fish Allergy Rash    Rash and vomiting    Chart Review Today: I personally reviewed active problem list, medication list,  allergies, family history, social history, health maintenance, notes from last encounter, lab results, imaging with the patient/caregiver today.   Review of Systems  10 Systems reviewed and are negative for acute change except as noted in the HPI.  Objective:    Vitals:   01/10/20 0902  BP: 136/70  Pulse: 80  Resp: 14  Temp: 98.5 F (36.9 C)  SpO2: 99%  Weight: 120 lb 3.2 oz (54.5 kg)  Height: 5\' 4"  (1.626 m)    Body mass index is 20.63 kg/m.  Physical Exam Vitals and nursing note reviewed.  Constitutional:      General: She is not in acute distress.  Appearance: Normal appearance. She is well-developed. She is not ill-appearing, toxic-appearing or diaphoretic.     Interventions: Face mask in place.  HENT:     Head: Normocephalic and atraumatic.     Right Ear: External ear normal.     Left Ear: External ear normal.  Eyes:     General: Lids are normal. No scleral icterus.       Right eye: No discharge.        Left eye: No discharge.     Conjunctiva/sclera: Conjunctivae normal.  Neck:     Trachea: Phonation normal. No tracheal deviation.  Cardiovascular:     Rate and Rhythm: Normal rate and regular rhythm.     Pulses: Normal pulses.          Radial pulses are 2+ on the right side and 2+ on the left side.       Posterior tibial pulses are 2+ on the right side and 2+ on the left side.     Heart sounds: Normal heart sounds. No murmur. No friction rub. No gallop.   Pulmonary:     Effort: Pulmonary effort is normal. No respiratory distress.     Breath sounds: Normal breath sounds. No stridor. No wheezing, rhonchi or rales.  Chest:     Chest wall: No tenderness.  Abdominal:     General: Bowel sounds are normal. There is no distension.     Palpations: Abdomen is soft.     Tenderness: There is no abdominal tenderness. There is no guarding or rebound.  Musculoskeletal:        General: No deformity. Normal range of motion.     Cervical back: Normal range of motion and  neck supple.     Right lower leg: No edema.     Left lower leg: No edema.  Feet:     Comments: Dm foot exam done Lymphadenopathy:     Cervical: No cervical adenopathy.  Skin:    General: Skin is warm and dry.     Capillary Refill: Capillary refill takes less than 2 seconds.     Coloration: Skin is not jaundiced or pale.     Findings: No rash.  Neurological:     Mental Status: She is alert and oriented to person, place, and time.     Motor: No abnormal muscle tone.     Gait: Gait normal.  Psychiatric:        Speech: Speech normal.        Behavior: Behavior normal.       Diabetic Foot Exam: Diabetic Foot Exam - Simple   Simple Foot Form Diabetic Foot exam was performed with the following findings: Yes 01/10/2020  9:40 AM  Visual Inspection Sensation Testing Pulse Check Comments     PHQ2/9: Depression screen [facility] 2/9 01/10/2020 10/28/2019 09/12/2019 06/10/2019 04/06/2019  Decreased Interest 0 0 0 0 0  Down, Depressed, Hopeless 0 0 0 0 0  PHQ - 2 Score 0 0 0 0 0  Altered sleeping 0 - 0 0 0  Tired, decreased energy 0 - 0 0 0  Change in appetite 0 - 0 0 0  Feeling bad or failure about yourself  0 - 0 0 0  Trouble concentrating 0 - 0 0 0  Moving slowly or fidgety/restless 0 - 0 0 0  Suicidal thoughts 0 - 0 0 0  PHQ-9 Score 0 - 0 0 0  Difficult doing work/chores Not difficult at all - Not difficult at all Not difficult  at all Not difficult at all  Some recent data might be hidden    phq 9 is neg  Fall Risk: Fall Risk  01/10/2020 10/28/2019 09/20/2019 09/12/2019 06/10/2019  Falls in the past year? 0 0 0 0 1  Number falls in past yr: 0 0 0 0 0  Injury with Fall? 0 0 0 0 1  Comment - - - - Right knee-sore  Risk for fall due to : - No Fall Risks - - -  Follow up - Falls prevention discussed - - -  Comment - - - - -    Functional Status Survey: Is the patient deaf or have difficulty hearing?: No Does the patient have difficulty seeing, even when wearing glasses/contacts?:  No Does the patient have difficulty concentrating, remembering, or making decisions?: No Does the patient have difficulty walking or climbing stairs?: No Does the patient have difficulty dressing or bathing?: No Does the patient have difficulty doing errands alone such as visiting a doctor's office or shopping?: No   Assessment & Plan:   1. Essential hypertension Blood pressure well controlled, patient had labs less than 3 months ago they were reviewed today she had normal renal function and electrolytes no need to repeat labs today - losartan (COZAAR) 25 MG tablet; Take 1 tablet (25 mg total) by mouth daily.  Dispense: 90 tablet; Refill: 3  2. Type 2 diabetes mellitus without complication, without long-term current use of insulin ([place]) Foot exam updated, patient scheduled for eye exam later today, point-of-care A1c was done, and her diabetes is well controlled with the A1c of 6.4 Results for orders placed or performed in visit on 01/10/20  POCT glycosylated hemoglobin (Hb A1C)  Result Value Ref Range   Hemoglobin A1C 6.4 (A) 4.0 - 5.6 %   HbA1c POC (<> result, manual entry)     HbA1c, POC (prediabetic range)     HbA1c, POC (controlled diabetic range)     - POCT glycosylated hemoglobin (Hb A1C) - losartan (COZAAR) 25 MG tablet; Take 1 tablet (25 mg total) by mouth daily.  Dispense: 90 tablet; Refill: 3 - sitaGLIPtin-metformin (JANUMET) 50-500 MG tablet; TAKE 1 TABLET BY MOUTH 2 (TWO) TIMES DAILY WITH A MEAL.  Dispense: 180 tablet; Refill: 3  3. Mixed hyperlipidemia Compliant with meds, no SE, no myalgias, fatigue or jaundice Labs recently done, cholesterol panel and liver function test reviewed everything is well controlled no concerns, meds refilled today - atorvastatin (LIPITOR) 80 MG tablet; TAKE 1 TABLET BY MOUTH EVERYDAY AT BEDTIME  Dispense: 90 tablet; Refill: 3  4. Gastroesophageal reflux disease, unspecified whether esophagitis present Well-controlled acid reflux with current  medications, encouraged her to continue to take Prilosec as needed  5. Osteopenia of neck of left femur Patient has been supplementing vitamin D and calcium but in low doses she was encouraged to start taking 1200 mg calcium and 1000 international units vitamin D daily and to do weightbearing exercises to prevent osteoporosis.  She has a diagnosis of osteopenia and is currently due for repeat DEXA, discussed osteoporosis prevention with her today discussed bone density and explained how it helps [place] calculate her risk for a major osteoporotic fracture or hip fracture if she happened to fall.  Discussed fall prevention and fall risk today, no recent falls.  She was given the number to call and schedule her bone density scan - [facility] Bone Density  6. Gastrointestinal stromal tumor (GIST) [facility]) Patient states she continues to follow-up with her oncology doctors for  monitoring  Return for 6 months for DM/HTN/HLD.   Delsa Grana, PA-C 01/10/20 9:31 AM

## 2020-01-10 NOTE — Patient Instructions (Signed)
Take 1200 mg calcium and 1000 IU Vit D daily for strong bones and do weight bearing exercises  Call [place] to schedule your Bone density scan  For you leg tingling and pain you can try and add a supplement of B-12 complex daily or iron supplement 3x a week to see if it improves your symptoms.  Since muscle spasms seems to have improved, continue the magnesium    Preventing Osteoporosis, Adult Osteoporosis is a condition that causes the bones to lose density. This means that the bones become thinner, and the normal spaces in bone tissue become larger. Low bone density can make the bones weak and cause them to break more easily. Osteoporosis cannot always be prevented, but you can take steps to lower your risk of developing this condition. How can this condition affect me? If you develop osteoporosis, you will be more likely to break bones in your wrist, spine, or hip. Even a minor accident or injury can be enough to break weak bones. The bones will also be slower to heal. Osteoporosis can cause other problems as well, such as a stooped posture or trouble with movement. Osteoporosis can occur with aging. As you get older, you may lose bone tissue more quickly, or it may be replaced more slowly. Osteoporosis is more likely to develop if you have poor nutrition or do not get enough calcium or vitamin D. Other lifestyle factors can also play a role. By eating a well-balanced diet and making lifestyle changes, you can help keep your bones strong and healthy, lowering your chances of developing osteoporosis. What can increase my risk? The following factors may make you more likely to develop osteoporosis:  Having a family history of the condition.  Having poor nutrition or not getting enough calcium or vitamin D.  Using certain medicines, such as steroid medicines or antiseizure medicines.  Being any of the following: ? 14 years of age or older. ? Female. ? A woman who has gone through  menopause (is postmenopausal). ? White (Caucasian) or of Asian descent.  Smoking or having a history of smoking.  Not being physically active (being sedentary).  Having a small body frame. What actions can I take to prevent this?  Get enough calcium   Make sure you get enough calcium every day. Calcium is the most important mineral for bone health. Most people can get enough calcium from their diet, but supplements may be recommended for people who are at risk for osteoporosis. Follow these guidelines: ? If you are age 45 or younger, aim to get 1,000 mg of calcium every day. ? If you are older than age 71, aim to get 1,200 mg of calcium every day.  Good sources of calcium include: ? Dairy products, such as low-fat or nonfat milk, cheese, and yogurt. ? Dark green leafy vegetables, such as bok choy and broccoli. ? Foods that have had calcium added to them (calcium-fortified foods), such as orange juice, cereal, bread, soy beverages, and tofu products. ? Nuts, such as almonds.  Check nutrition labels to see how much calcium is in a food or drink. Get enough vitamin D  Try to get enough vitamin D every day. Vitamin D is the most essential vitamin for bone health. It helps the body absorb calcium. Follow these guidelines for how much vitamin D to get from food: ? If you are age 67 or younger, aim to get at least 600 international units (IU) every day. Your health care provider may suggest more. ?  If you are older than age 86, aim to get at least 800 international units every day. Your health care provider may suggest more.  Good sources of vitamin D in your diet include: ? Egg yolks. ? Oily fish, such as salmon, sardines, and tuna. ? Milk and cereal fortified with vitamin D.  Your body also makes vitamin D when you are out in the sun. Exposing the bare skin on your face, arms, legs, or back to the sun for no more than 30 minutes a day, 2 times a week is more than enough. Beyond that,  make sure you use sunblock to protect your skin from sunburn, which increases your risk for skin cancer. Exercise  Stay active and get exercise every day.  Ask your health care provider what types of exercise are best for you. Weight-bearing and strength-building activities are important for building and maintaining healthy bones. Some examples of these types of activities include: ? Walking and hiking. ? Jogging and running. ? Dancing. ? Gym exercises. ? Lifting weights. ? Tennis and racquetball. ? Climbing stairs. ? Aerobics. Make other lifestyle changes  Do not use any products that contain nicotine or tobacco, such as cigarettes, e-cigarettes, and chewing tobacco. If you need help quitting, ask your health care provider.  Lose weight if you are overweight.  If you drink alcohol: ? Limit how much you use to:  0-1 drink a day for nonpregnant women.  0-2 drinks a day for men. ? Be aware of how much alcohol is in your drink. In the [place] one drink equals one 12 oz bottle of beer (355 mL), one 5 oz glass of wine (148 mL), or one 1 oz glass of hard liquor (44 mL). Where to find support If you need help making changes to prevent osteoporosis, talk with your health care provider. You can ask for a referral to a diet and nutrition specialist (dietitian) and a physical therapist. Where to find more information Learn more about osteoporosis from:  NIH Osteoporosis and Related [place]: www.bones.SouthExposed.es  [place] Office on [employer] Health: [place].tn  [place]: EquipmentWeekly.com.ee Summary  Osteoporosis is a condition that causes weak bones that are more likely to break.  Eat a healthy diet, making sure you get enough calcium and vitamin D, and stay active by getting regular exercise to help prevent osteoporosis.  Other ways to reduce your risk of osteoporosis include maintaining a healthy weight and avoiding alcohol and products  that contain nicotine or tobacco. This information is not intended to replace advice given to you by your health care provider. Make sure you discuss any questions you have with your health care provider. Document Revised: 05/06/2019 Document Reviewed: 05/06/2019 Elsevier Patient Education  [place].

## 2020-01-18 LAB — HM DIABETES EYE EXAM

## 2020-01-19 ENCOUNTER — Encounter: Payer: Self-pay | Admitting: Family Medicine

## 2020-01-26 ENCOUNTER — Ambulatory Visit
Admission: RE | Admit: 2020-01-26 | Discharge: 2020-01-26 | Disposition: A | Discharge status: Home / Self Care | Payer: Medicare Other | Source: Ambulatory Visit | Attending: Family Medicine | Admitting: Family Medicine

## 2020-01-26 DIAGNOSIS — M85852 Other specified disorders of bone density and structure, left thigh: Secondary | ICD-10-CM | POA: Diagnosis not present

## 2020-03-28 NOTE — Progress Notes (Signed)
[facility]  [address], [address] [place], [place] [address] Phone: [phone]  Fax: [phone]   Clinic Day:  03/29/2020  Referring physician: Arnetha Courser, MD  Chief Complaint: Rachael Castro is a 86 y.o. female with a history of right breast cancer (1999) and gastrointestinal stromal tumor (2013) who is seen for 6 month assessment.  HPI: The patient was last seen in the medical oncology clinic on 09/29/2019. At that time, she was doing well. Hematocrit 33.2, hemoglobin 11.2, platelets 247,000, WBC 7,700. CMP was normal. CA 27.29 was 43.4. She continued on calcium and vitamin D.   Bone density on 01/26/2020 revealed osteopenia with a T-score of -2.3 at the left neck femur.   During the interim, she has been "good." She reports that when she eats certain foods like ice cream, slaw, and salads, she gets diarrhea. She is planning to try Lactaid but has not yet. She feels that her diet has been good lately. The patient reports joint pain in her neck and bilateral shoulders, wrists, and elbows. When she turns her neck to the right, it feels tight. She is also still having tingling in her right leg, which is stable. The patient denies nausea, vomiting, abdominal pain, and cramping.   The patient recently tripped and fell on a rock while working in her garden but she was not injured. The patient is happy with her weight at this time and she performs regular breast self-exams. She is taking her calcium, magnesium, and Vitamin D. She received both doses of her COVID-19 vaccine in 10/2019 and 11/2019. The patient agrees to have a CT scan at the end of this year.   Past Medical History:  Diagnosis Date  . Breast cancer ([place]) 1999   right breast  . Diabetes mellitus   . GERD (gastroesophageal reflux disease)   . GIST (gastrointestinal stromal tumor), malignant ([place]) 2013  . History of shingles   . Hypercholesteremia   . Hypertension   . Kidney stones   .  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 Osteopenia 12/24/2017   March 2019; next scan March 2021  . Personal history of chemotherapy   . Personal history of colonic polyps   . Thyroid disease     Past Surgical History:  Procedure Laterality Date  . ABDOMINAL HYSTERECTOMY    . APPENDECTOMY    . BREAST BIOPSY Left 09/08/2016   Done by Dr. Jamal Collin  . BREAST EXCISIONAL BIOPSY Right 1999   Mastectomy  . BREAST SURGERY Right 1999   mastectomy  . CATARACT EXTRACTION W/PHACO Left 04/30/2015   Procedure: CATARACT EXTRACTION PHACO AND INTRAOCULAR LENS PLACEMENT (IOC);  Surgeon: Estill Cotta, MD;  Location: [facility] ORS;  Service: Ophthalmology;  Laterality: Left;  [place] 01:39 AP% 23.3 CDE 40.42 fluid pack lot # [id number]  . COLONOSCOPY  2008   Sankar  . EUS  02/12/2012   Procedure: UPPER ENDOSCOPIC ULTRASOUND (EUS) LINEAR;  Surgeon: Milus Banister, MD;  Location: [facility];  Service: Endoscopy;  Laterality: N/A;  radial linear  . EYE SURGERY Right 2013   cataract  . HERNIA REPAIR  2012  . LAPAROTOMY  2013   excision of gastric wall mass   . MASTECTOMY    . SALPINGOOPHORECTOMY Right 1979  . THYROIDECTOMY  1978  . TONSILLECTOMY    . TUBAL LIGATION    . UPPER GI ENDOSCOPY  2013    Family History  Problem Relation Age  of Onset  . Cancer Mother        cervical  . Cancer Father        prostate  . Kidney Stones Brother   . Glaucoma Brother   . Prostatitis Brother   . Diabetes Sister   . Thyroid disease Brother   . Cancer Brother        thyroid  . Arthritis Brother   . Diabetes Sister   . Kidney disease Neg Hx   . Bladder Cancer Neg Hx   . Breast cancer Neg Hx     Social History:  reports that she has never smoked. She has never used smokeless tobacco. She reports that she does not drink alcohol and does not use drugs. She lives in [place]. The patient is alone today.  Allergies:  Allergies  Allergen Reactions  . Fish Allergy Rash    Rash  and vomiting    Current Medications: Current Outpatient Medications  Medication Sig Dispense Refill  . atorvastatin (LIPITOR) 80 MG tablet TAKE 1 TABLET BY MOUTH EVERYDAY AT BEDTIME 90 tablet 3  . calcium citrate-vitamin D 200-200 MG-UNIT TABS Take 1 tablet by mouth daily.    Marland Kitchen losartan (COZAAR) 25 MG tablet Take 1 tablet (25 mg total) by mouth daily. 90 tablet 3  . Magnesium 100 MG CAPS Take 1 capsule by mouth daily.    . Multiple Vitamin (MULTIVITAMIN) tablet Take 1 tablet by mouth daily.    Marland Kitchen omeprazole (PRILOSEC) 20 MG capsule Take 1 capsule (20 mg total) by mouth daily as needed. 90 capsule 1  . sitaGLIPtin-metformin (JANUMET) 50-500 MG tablet TAKE 1 TABLET BY MOUTH 2 (TWO) TIMES DAILY WITH A MEAL. 180 tablet 3  . thiamine (VITAMIN B-1) 100 MG tablet Take 100 mg by mouth daily.     No current facility-administered medications for this visit.    Review of Systems  Constitutional: Positive for weight loss (7 lbs since last visit). Negative for chills, diaphoresis, fever and malaise/fatigue.       "Doing well".  HENT: Negative.  Negative for congestion, ear pain, hearing loss, nosebleeds, sinus pain and sore throat.   Eyes: Negative.  Negative for blurred vision, double vision and photophobia.  Respiratory: Negative.  Negative for cough, hemoptysis, sputum production and shortness of breath (exertional).   Cardiovascular: Negative.  Negative for chest pain, palpitations, orthopnea, leg swelling and PND.  Gastrointestinal: Positive for diarrhea. Negative for abdominal pain, blood in stool, constipation, melena, nausea and vomiting.  Genitourinary: Negative.  Negative for dysuria, frequency, hematuria and urgency.       BILATERAL nephrolithiasis; small and nonobstructing  Musculoskeletal: Positive for joint pain (B/L shoulders, elbows, and wrists) and neck pain (known cervical neuralgia). Negative for back pain, falls and myalgias.       Reports tightness when she turns her neck to the  right.  Skin: Negative for itching and rash (uses topical cream, improving).  Neurological: Positive for tingling (RIGHT lower extremity; stable). Negative for dizziness, tremors, speech change, focal weakness, seizures, weakness and headaches.  Endo/Heme/Allergies: Negative.  Does not bruise/bleed easily.  Psychiatric/Behavioral: Negative for depression and memory loss. The patient is not nervous/anxious and does not have insomnia.   All other systems reviewed and are negative.   Performance status (ECOG):  1  Physical Exam Vitals and nursing note reviewed.  Constitutional:      General: She is not in acute distress.    Appearance: She is well-developed and well-nourished. She is not diaphoretic.  HENT:  Head: Normocephalic and atraumatic.     Mouth/Throat:     Mouth: Oropharynx is clear and moist.     Pharynx: No oropharyngeal exudate.  Eyes:     General: No scleral icterus.    Extraocular Movements: EOM normal.     Conjunctiva/sclera: Conjunctivae normal.     Pupils: Pupils are equal, round, and reactive to light.     Comments: Glasses.  Brown eyes.  Cardiovascular:     Rate and Rhythm: Normal rate and regular rhythm.     Heart sounds: Normal heart sounds.  Pulmonary:     Effort: Pulmonary effort is normal. No respiratory distress.     Breath sounds: Normal breath sounds. No wheezing or rales.     Comments: Right mastectomy without erythema or nodularity. Chest:     Chest wall: No tenderness.     Breasts:        Right: No inverted nipple, mass, nipple discharge, skin change or tenderness.        Left: No inverted nipple, mass, nipple discharge, skin change or tenderness.  Abdominal:     General: Bowel sounds are normal. There is no distension.     Palpations: Abdomen is soft. There is no mass.     Tenderness: There is no abdominal tenderness. There is no guarding or rebound.  Musculoskeletal:        General: Tenderness (left neck) present. No edema. Normal range of  motion.     Cervical back: Normal range of motion and neck supple.     Comments: Tight muscles of the neck  Lymphadenopathy:     Head:     Right side of head: No preauricular, posterior auricular or occipital adenopathy.     Left side of head: No preauricular, posterior auricular or occipital adenopathy.     Cervical: No cervical adenopathy.     Upper Body:  No axillary adenopathy present.    Right upper body: No supraclavicular adenopathy.     Left upper body: No supraclavicular adenopathy.     Lower Body: No right inguinal adenopathy. No left inguinal adenopathy.  Skin:    General: Skin is warm and dry.     Coloration: Skin is not pale.     Findings: No erythema or rash.  Neurological:     Mental Status: She is alert and oriented to person, place, and time.  Psychiatric:        Mood and Affect: Mood and affect normal.        Behavior: Behavior normal.        Thought Content: Thought content normal.        Judgment: Judgment normal.    Imaging studies: 04/17/2015:  Abdominal and pelvis CTrevealed postsurgical changes related to the prior gastric resection. There was no evidence of recurrent or metastatic disease. There were multiple nonobstructing bilateral renal calculi measuring up to 2 mm.  02/13/20217:  Abdominal and pelvic CTrevealed a stable exam with no evidence of recurrent disease. There were tiny nonobstructive intrarenal calculi without evidence of hydronephrosis. 06/17/2016:  Chest, abdomen, and pelvis CTrevealed a new solitary mildly enlarged 1.0 cmleft axillary lymph node, nonspecific, nodal metastasis not excluded. There were no additional potential findings of metastatic disease in the chest, abdomen or pelvis. There was no evidence of local tumor recurrence in the stomach.  08/25/2017:  Chest, abdomen, and pelvis CTdemonstrated further enlargement of the LEFT axillary lymph node. Of note, this area was previously biopsied under ultrasound guidance on 08/2016  and found  to be benign lymphoid hyperplasia. Axillary node measured 12 mm (previously 10 mm). There was a stable 2 mm lung nodule in the RIGHT middle lobe. There was a 5 mm subcutaneous nodule laterally in the RIGHT chest wall (stable). 08/25/2018:  Chest, abdomen, and pelvic CT revealed a stable exam. The left axillary lymph nodes described on previous exam appearedstable to decreased in size in the interval. There was no evidence of metastatic disease within the chest, abdomen or pelvis. 08/26/2019:  Chest, abdomen, and pelvis CT revealed no evidence for metastatic disease.     Appointment on 03/29/2020  Component Date Value Ref Range Status  . Sodium 03/29/2020 142  135 - 145 mmol/L Final  . Potassium 03/29/2020 4.5  3.5 - 5.1 mmol/L Final  . Chloride 03/29/2020 105  98 - 111 mmol/L Final  . CO2 03/29/2020 28  22 - 32 mmol/L Final  . Glucose, Bld 03/29/2020 108* 70 - 99 mg/dL Final   Glucose reference range applies only to samples taken after fasting for at least 8 hours.  . BUN 03/29/2020 26* 8 - 23 mg/dL Final  . Creatinine, Ser 03/29/2020 0.91  0.44 - 1.00 mg/dL Final  . Calcium 03/29/2020 9.6  8.9 - 10.3 mg/dL Final  . Total Protein 03/29/2020 7.5  6.5 - 8.1 g/dL Final  . Albumin 03/29/2020 4.1  3.5 - 5.0 g/dL Final  . AST 03/29/2020 19  15 - 41 U/L Final  . ALT 03/29/2020 13  0 - 44 U/L Final  . Alkaline Phosphatase 03/29/2020 59  38 - 126 U/L Final  . Total Bilirubin 03/29/2020 0.3  0.3 - 1.2 mg/dL Final  . GFR calc non Af Amer 03/29/2020 57* >60 mL/min Final  . GFR calc Af Amer 03/29/2020 >60  >60 mL/min Final  . Anion gap 03/29/2020 9  5 - 15 Final   Performed at [facility], [address], [place], [place] [address]  . WBC 03/29/2020 7.2  4.0 - 10.5 K/uL Final  . RBC 03/29/2020 3.42* 3.87 - 5.11 MIL/uL Final  . Hemoglobin 03/29/2020 10.9* 12.0 - 15.0 g/dL Final  . HCT 03/29/2020 33.0* 36 - 46 % Final  . MCV 03/29/2020 96.5  80.0 - 100.0 fL Final  . MCH  03/29/2020 31.9  26.0 - 34.0 pg Final  . MCHC 03/29/2020 33.0  30.0 - 36.0 g/dL Final  . RDW 03/29/2020 13.4  11.5 - 15.5 % Final  . Platelets 03/29/2020 245  150 - 400 K/uL Final  . nRBC 03/29/2020 0.0  0.0 - 0.2 % Final  . Neutrophils Relative % 03/29/2020 63  % Final  . Neutro Abs 03/29/2020 4.5  1.7 - 7.7 K/uL Final  . Lymphocytes Relative 03/29/2020 30  % Final  . Lymphs Abs 03/29/2020 2.2  0.7 - 4.0 K/uL Final  . Monocytes Relative 03/29/2020 5  % Final  . Monocytes Absolute 03/29/2020 0.4  0 - 1 K/uL Final  . Eosinophils Relative 03/29/2020 2  % Final  . Eosinophils Absolute 03/29/2020 0.1  0 - 0 K/uL Final  . Basophils Relative 03/29/2020 0  % Final  . Basophils Absolute 03/29/2020 0.0  0 - 0 K/uL Final  . Immature Granulocytes 03/29/2020 0  % Final  . Abs Immature Granulocytes 03/29/2020 0.03  0.00 - 0.07 K/uL Final   Performed at [facility], [address], [place], [place] [address]    Assessment:  Angeli Demilio is a 86 y.o. female with a history ofright  breast cancerstatus post modified radical mastectomy in 1999 followed by 4 cycles of Adriamycin and Cytoxan. She received tamoxifenthen extended adjuvant therapy with Femara.   Left sided mammogramon 07/19/2015 revealed no evidence of malignancy. Left sided mammogram and ultrasoundon 09/02/2016 revealed a borderline lymph node in the left axilla, partially behind the lateral edge of the pectoralis muscle. Left axillary core needle biopsyon 09/08/2016 revealed benign lymphoid hyperplasia. Left screening mammogram on 09/04/2017 that revealed no mammographic evidence of malignancy.  Left screening mammogram on 09/06/2018 revealed no evidence of malignancy.  Left screening mammogram on 09/08/2019 revealed no evidence of malignancy.   CA27.29was 46.6 on 05/30/2015, 41.9 on 07/09/2015, 42.9 on 11/05/2015, 46.2 on 12/05/2015, 53.5 on 06/04/2016, 50.0 on 08/06/2016, 43.7 on 02/04/2017, 39.2 on 08/05/2017,  44.6 on 02/03/2018, 50.4 on 08/25/2018, 53 on 03/30/2019, 43.4 on 09/28/2019, and 48.9 on 03/29/2020.  She has a history of gastrointestinal stromal tumor (GIST) status post resection on 02/27/2012. Pathology revealed a 6.4 cm tumor. She received a year of Gleevecbeginning 03/2012. Her dose was reduced secondary to side effects.  Chest, abdomen, and pelvis CT on 08/26/2019 revealed no evidence for metastatic disease.  Bone densityon 12/24/2017 revealed osteopeniawith a T score of -1.8 in the left femoral neck.  Bone density on 01/26/2020 revealed osteopenia with a T-score of -2.3 at the left neck femur.   Symptomatically, she denies any abdominal symptoms.  Exam is unremarkable.  Plan: 1.   Labs today: CBC with diff, CMP, CA 27.29. 2.  Right breast cancer Clinically, she is doing well. Exam reveals no evidence of recurrent disease Left screening mammogram on 09/08/2019 revealed no evidence of malignancy. CA27.29 is 48.9. CA 27.29 fluctuates.  Prior imaging studies with CA27.29 from 39.2 - 53.5. Continue to monitor. 3.  Gastrointestinal stromal cancer Clinically, she continues to do well Chest, abdomen, and pelvis CT on 08/26/2019 revealed no evidence for metastatic disease. Discuss plan for ongoing imaging yearly. Continue to monitor 4.   Osteopenia Review interval bone density study. Continue calcium and vitamin D. 5.   Normocytic anemia  Hematocrit 33.0.  Hemoglobin 10.9.  Anemia work-up sent:  ferritin, iron studies, B12, folate, TSH. 6.   Mammogram on 09/07/2020. 7.   Chest, abdomen, and pelvis CT 08/27/2020. 8.   RTC in 6 months for MD assessment, labs (CBC with diff, CMP, CA27.29), and review of imaging.  I discussed the assessment and treatment plan with the patient.  The patient was provided an opportunity to ask questions and all were answered.  The patient agreed with the plan and demonstrated an understanding of the instructions.  The patient was advised to  call back or seek an in person evaluation if the symptoms worsen or if the condition fails to improve as anticipated.   Nolon Stalls, MD, PhD  03/29/2020, 2:31 PM  I, Selena Batten, am acting as scribe for [employer]. Mike Gip, MD, PhD.  I, Melissa C. Mike Gip, MD, have reviewed the above documentation for accuracy and completeness, and I agree with the above.

## 2020-03-28 NOTE — Progress Notes (Signed)
Patient gets diarrhea on  some occasions.

## 2020-03-29 ENCOUNTER — Other Ambulatory Visit: Payer: Self-pay

## 2020-03-29 ENCOUNTER — Inpatient Hospital Stay: Payer: Medicare Other | Attending: Hematology and Oncology

## 2020-03-29 ENCOUNTER — Inpatient Hospital Stay (HOSPITAL_BASED_OUTPATIENT_CLINIC_OR_DEPARTMENT_OTHER): Payer: Medicare Other | Admitting: Hematology and Oncology

## 2020-03-29 ENCOUNTER — Encounter: Payer: Self-pay | Admitting: Hematology and Oncology

## 2020-03-29 VITALS — BP 145/70 | HR 68 | Temp 95.7°F | Wt 119.3 lb

## 2020-03-29 DIAGNOSIS — M85852 Other specified disorders of bone density and structure, left thigh: Secondary | ICD-10-CM | POA: Insufficient documentation

## 2020-03-29 DIAGNOSIS — Z853 Personal history of malignant neoplasm of breast: Secondary | ICD-10-CM | POA: Diagnosis not present

## 2020-03-29 DIAGNOSIS — Z8509 Personal history of malignant neoplasm of other digestive organs: Secondary | ICD-10-CM | POA: Diagnosis not present

## 2020-03-29 DIAGNOSIS — D649 Anemia, unspecified: Secondary | ICD-10-CM

## 2020-03-29 LAB — CBC WITH DIFFERENTIAL/PLATELET
Abs Immature Granulocytes: 0.03 10*3/uL (ref 0.00–0.07)
Basophils Absolute: 0 10*3/uL (ref 0.0–0.1)
Basophils Relative: 0 %
Eosinophils Absolute: 0.1 10*3/uL (ref 0.0–0.5)
Eosinophils Relative: 2 %
HCT: 33 % — ABNORMAL LOW (ref 36.0–46.0)
Hemoglobin: 10.9 g/dL — ABNORMAL LOW (ref 12.0–15.0)
Immature Granulocytes: 0 %
Lymphocytes Relative: 30 %
Lymphs Abs: 2.2 10*3/uL (ref 0.7–4.0)
MCH: 31.9 pg (ref 26.0–34.0)
MCHC: 33 g/dL (ref 30.0–36.0)
MCV: 96.5 fL (ref 80.0–100.0)
Monocytes Absolute: 0.4 10*3/uL (ref 0.1–1.0)
Monocytes Relative: 5 %
Neutro Abs: 4.5 10*3/uL (ref 1.7–7.7)
Neutrophils Relative %: 63 %
Platelets: 245 10*3/uL (ref 150–400)
RBC: 3.42 MIL/uL — ABNORMAL LOW (ref 3.87–5.11)
RDW: 13.4 % (ref 11.5–15.5)
WBC: 7.2 10*3/uL (ref 4.0–10.5)
nRBC: 0 % (ref 0.0–0.2)

## 2020-03-29 LAB — COMPREHENSIVE METABOLIC PANEL
ALT: 13 U/L (ref 0–44)
AST: 19 U/L (ref 15–41)
Albumin: 4.1 g/dL (ref 3.5–5.0)
Alkaline Phosphatase: 59 U/L (ref 38–126)
Anion gap: 9 (ref 5–15)
BUN: 26 mg/dL — ABNORMAL HIGH (ref 8–23)
CO2: 28 mmol/L (ref 22–32)
Calcium: 9.6 mg/dL (ref 8.9–10.3)
Chloride: 105 mmol/L (ref 98–111)
Creatinine, Ser: 0.91 mg/dL (ref 0.44–1.00)
GFR calc Af Amer: >60 mL/min (ref >60)
GFR calc non Af Amer: 57 mL/min — ABNORMAL LOW (ref >60)
Glucose, Bld: 108 mg/dL — ABNORMAL HIGH (ref 70–99)
Potassium: 4.5 mmol/L (ref 3.5–5.1)
Sodium: 142 mmol/L (ref 135–145)
Total Bilirubin: 0.3 mg/dL (ref 0.3–1.2)
Total Protein: 7.5 g/dL (ref 6.5–8.1)

## 2020-03-30 LAB — FERRITIN: Ferritin: 58 ng/mL (ref 11–307)

## 2020-03-30 LAB — IRON AND TIBC
Iron: 89 ug/dL (ref 28–170)
Saturation Ratios: 27 % (ref 10.4–31.8)
TIBC: 329 ug/dL (ref 250–450)
UIBC: 240 ug/dL

## 2020-03-30 LAB — TSH: TSH: 1.286 u[IU]/mL (ref 0.350–4.500)

## 2020-03-30 LAB — VITAMIN B12: Vitamin B-12: 946 pg/mL — ABNORMAL HIGH (ref 180–914)

## 2020-03-30 LAB — FOLATE: Folate: >100 ng/mL (ref >5.9)

## 2020-03-30 LAB — CANCER ANTIGEN 27.29: CA 27.29: 48.9 U/mL — ABNORMAL HIGH (ref 0.0–38.6)

## 2020-07-11 ENCOUNTER — Other Ambulatory Visit: Payer: Self-pay | Admitting: Family Medicine

## 2020-07-11 DIAGNOSIS — C49A0 Gastrointestinal stromal tumor, unspecified site: Secondary | ICD-10-CM

## 2020-07-11 NOTE — Progress Notes (Signed)
Name: Rachael Castro   MRN: [id number]    DOB: 06/27/1931   Date:07/11/2020       Progress Note  Chief Complaint  Patient presents with  . Diabetes  . Hyperlipidemia  . Hypertension     Subjective:   Rachael Castro is a 86 y.o. female, presents to clinic for routine follow-up  DM:   Pt managing DM with Janumet Reports good med compliance Pt has no SE from meds. Blood sugars -not monitoring Denies: Polyuria, polydipsia, vision changes, neuropathy, hypoglycemia Recent pertinent labs: Lab Results  Component Value Date   HGBA1C 6.4 (A) 01/10/2020   HGBA1C 6.5 06/10/2019   HGBA1C 6.5 (H) 12/10/2018   Standard of care and health maintenance: Urine Microalbumin:  Ordered  Foot exam:  01/10/20 DM eye exam:  01/18/20 ACEI/ARB: On losartan Statin: On Lipitor  Hyperlipidemia: Currently treated with Lipitor 80 mg qd pt reports good med compliance Last Lipids: Lab Results  Component Value Date   CHOL 176 09/20/2019   HDL 60 09/20/2019   LDLCALC 94 09/20/2019   TRIG 128 09/20/2019   CHOLHDL 2.9 09/20/2019   - Denies: Chest pain, shortness of breath, myalgias, claudication  Hypertension:  Currently managed on Losartan 25mg  qd Pt reports good med compliance and denies any SE.   Blood pressure today is well controlled. BP Readings from Last 3 Encounters:  07/12/20 120/76  03/28/20 (!) 145/70  01/10/20 136/70   Pt denies CP, SOB, exertional sx, LE edema, palpitation, Ha's, visual disturbances, lightheadedness, hypotension, syncope. Dietary efforts for BP?  Healthy diet stays active as able  Patient takes vitamin D and calcium supplements multivitamin, magnesium and thiamine supplements daily  She is omeprazole to manage acid reflux   Current Outpatient Medications:  .  atorvastatin (LIPITOR) 80 MG tablet, TAKE 1 TABLET BY MOUTH EVERYDAY AT BEDTIME, Disp: 90 tablet, Rfl: 3 .  calcium citrate-vitamin D 200-200 MG-UNIT TABS, Take 1 tablet by mouth daily., Disp: ,  Rfl:  .  losartan (COZAAR) 25 MG tablet, Take 1 tablet (25 mg total) by mouth daily., Disp: 90 tablet, Rfl: 3 .  Magnesium 100 MG CAPS, Take 1 capsule by mouth daily., Disp: , Rfl:  .  Multiple Vitamin (MULTIVITAMIN) tablet, Take 1 tablet by mouth daily., Disp: , Rfl:  .  omeprazole (PRILOSEC) 20 MG capsule, Take 1 capsule (20 mg total) by mouth daily as needed., Disp: 90 capsule, Rfl: 1 .  sitaGLIPtin-metformin (JANUMET) 50-500 MG tablet, TAKE 1 TABLET BY MOUTH 2 (TWO) TIMES DAILY WITH A MEAL., Disp: 180 tablet, Rfl: 3 .  thiamine (VITAMIN B-1) 100 MG tablet, Take 100 mg by mouth daily., Disp: , Rfl:   Patient Active Problem List   Diagnosis Date Noted  . Impingement syndrome of shoulder region 05/16/2019  . Osteopenia 12/24/2017  . Hypomagnesemia 09/01/2016  . Hyperlipidemia 10/23/2015  . Type 2 diabetes mellitus ([place]) 10/23/2015  . Hypertension 06/18/2015  . H/O malignant gastrointestinal stromal tumor (GIST) 05/30/2015  . Postherpetic neuralgia 04/17/2015  . DDD (degenerative disc disease), lumbar 04/17/2015  . DDD (degenerative disc disease), lumbar 04/17/2015  . Personal history of malignant neoplasm of breast 07/04/2013  . History of colonic polyps     Past Surgical History:  Procedure Laterality Date  . ABDOMINAL HYSTERECTOMY    . APPENDECTOMY    . BREAST BIOPSY Left 09/08/2016   Done by Dr. Jamal Collin  . BREAST EXCISIONAL BIOPSY Right 1999   Mastectomy  . BREAST SURGERY Right 1999   mastectomy  .  CATARACT EXTRACTION W/PHACO Left 04/30/2015   Procedure: CATARACT EXTRACTION PHACO AND INTRAOCULAR LENS PLACEMENT (IOC);  Surgeon: Estill Cotta, MD;  Location: [facility];  Service: Ophthalmology;  Laterality: Left;  [place] 01:39 AP% 23.3 CDE 40.42 fluid pack lot # [id number]  . COLONOSCOPY  2008   Sankar  . EUS  02/12/2012   Procedure: UPPER ENDOSCOPIC ULTRASOUND (EUS) LINEAR;  Surgeon: Milus Banister, MD;  Location: [facility];  Service: Endoscopy;  Laterality: N/A;  radial  linear  . EYE SURGERY Right 2013   cataract  . HERNIA REPAIR  2012  . LAPAROTOMY  2013   excision of gastric wall mass   . MASTECTOMY    . SALPINGOOPHORECTOMY Right 1979  . THYROIDECTOMY  1978  . TONSILLECTOMY    . TUBAL LIGATION    . UPPER GI ENDOSCOPY  2013    Family History  Problem Relation Age of Onset  . Cancer Mother        cervical  . Cancer Father        prostate  . Kidney Stones Brother   . Glaucoma Brother   . Prostatitis Brother   . Diabetes Sister   . Thyroid disease Brother   . Cancer Brother        thyroid  . Arthritis Brother   . Diabetes Sister   . Kidney disease Neg Hx   . Bladder Cancer Neg Hx   . Breast cancer Neg Hx     Social History   Tobacco Use  . Smoking status: Never Smoker  . Smokeless tobacco: Never Used  Vaping Use  . Vaping Use: Never used  Substance Use Topics  . Alcohol use: No  . Drug use: No     Allergies  Allergen Reactions  . Fish Allergy Rash    Rash and vomiting    Health Maintenance  Topic Date Due  . COVID-19 Vaccine (1) Never done  . INFLUENZA VACCINE  05/20/2020  . HEMOGLOBIN A1C  07/12/2020  . MAMMOGRAM  09/07/2020  . FOOT EXAM  01/09/2021  . OPHTHALMOLOGY EXAM  01/17/2021  . DEXA SCAN  01/25/2022  . TETANUS/TDAP  04/26/2022  . PNA vac Low Risk Adult  Completed  . COLONOSCOPY  Discontinued    Chart Review Today: I personally reviewed active problem list, medication list, allergies, family history, social history, health maintenance, notes from last encounter, lab results, imaging with the patient/caregiver today.   Review of Systems  10 Systems reviewed and are negative for acute change except as noted in the HPI.  Objective:   Vitals:   07/12/20 0834  BP: 120/76  Pulse: 80  Resp: 14  Temp: 97.9 F (36.6 C)  TempSrc: Oral  SpO2: 96%  Weight: 117 lb 9.6 oz (53.3 kg)  Height: 5\' 4"  (1.626 m)    Body mass index is 20.19 kg/m.  Physical Exam Vitals and nursing note reviewed.    Constitutional:      General: She is not in acute distress.    Appearance: Normal appearance. She is well-developed. She is not ill-appearing, toxic-appearing or diaphoretic.     Interventions: Face mask in place.     Comments: Elderly female, appears younger than stated age, pleasant, well-appearing  HENT:     Head: Normocephalic and atraumatic.     Right Ear: External ear normal.     Left Ear: External ear normal.  Eyes:     General: Lids are normal. No scleral icterus.       Right  eye: No discharge.        Left eye: No discharge.     Conjunctiva/sclera: Conjunctivae normal.  Neck:     Trachea: Phonation normal. No tracheal deviation.  Cardiovascular:     Rate and Rhythm: Normal rate and regular rhythm.     Pulses: Normal pulses.          Radial pulses are 2+ on the right side and 2+ on the left side.     Heart sounds: Normal heart sounds. No murmur heard.  No friction rub. No gallop.   Pulmonary:     Effort: Pulmonary effort is normal. No respiratory distress.     Breath sounds: Normal breath sounds. No stridor. No wheezing, rhonchi or rales.  Chest:     Chest wall: No tenderness.  Abdominal:     General: Bowel sounds are normal. There is no distension.     Palpations: Abdomen is soft.  Musculoskeletal:     Right lower leg: No edema.     Left lower leg: No edema.  Skin:    General: Skin is warm and dry.     Coloration: Skin is not jaundiced or pale.     Findings: No rash.  Neurological:     Mental Status: She is alert.     Motor: No abnormal muscle tone.     Gait: Gait normal.  Psychiatric:        Mood and Affect: Mood normal.        Speech: Speech normal.        Behavior: Behavior normal.         Assessment & Plan:     ICD-10-CM   1. Type 2 diabetes mellitus without complication, without long-term current use of insulin (HCC)  E11.9 sitaGLIPtin-metformin (JANUMET) 50-500 MG tablet    POCT HgB A1C   Diabetes has been well controlled she is compliant with  her medications without any side effects or concerns due for A1c today  2. Essential hypertension  I10    Stable, well-controlled hypertension, blood pressure at goal today on losartan 25 mg, continue healthy diet exercise low-salt  3. Mixed hyperlipidemia  E78.2    Compliant with statin she denies any myalgias or fatigue  4. Gastroesophageal reflux disease, unspecified whether esophagitis present  K21.9    Well-controlled with omeprazole  5. Need for influenza vaccination  Z23 Flu Vaccine QUAD High Dose(Fluad)   Reviewed labs from about 3 months ago patient's renal function was good she had stable anemia, electrolytes, kidney function and liver function were normal, iron panel was done by oncology and it looked good Last lipid panel was in December LDL was well controlled, patient return in about 3 to 4 months and will be at that time to repeat the A1c and lipids.  I agree with patient today with her recent labs from the specialist do not need to draw a lot of blood and we can wait until our next routine office visit.    Return in about 4 months (around 11/11/2020).   Delsa Grana, PA-C 07/11/20 1:56 PM

## 2020-07-12 ENCOUNTER — Encounter: Payer: Self-pay | Admitting: Family Medicine

## 2020-07-12 ENCOUNTER — Other Ambulatory Visit: Payer: Self-pay

## 2020-07-12 ENCOUNTER — Ambulatory Visit: Payer: Medicare Other | Admitting: Family Medicine

## 2020-07-12 VITALS — BP 120/76 | HR 80 | Temp 97.9°F | Resp 14 | Ht 64.0 in | Wt 117.6 lb

## 2020-07-12 DIAGNOSIS — E119 Type 2 diabetes mellitus without complications: Secondary | ICD-10-CM | POA: Diagnosis not present

## 2020-07-12 DIAGNOSIS — E782 Mixed hyperlipidemia: Secondary | ICD-10-CM | POA: Diagnosis not present

## 2020-07-12 DIAGNOSIS — K219 Gastro-esophageal reflux disease without esophagitis: Secondary | ICD-10-CM | POA: Diagnosis not present

## 2020-07-12 DIAGNOSIS — Z23 Encounter for immunization: Secondary | ICD-10-CM | POA: Diagnosis not present

## 2020-07-12 DIAGNOSIS — I10 Essential (primary) hypertension: Secondary | ICD-10-CM

## 2020-07-12 LAB — POCT GLYCOSYLATED HEMOGLOBIN (HGB A1C): Hemoglobin A1C: 6.2 % — AB (ref 4.0–5.6)

## 2020-07-12 MED ORDER — JANUMET 50-500 MG PO TABS: 1.0000 | ORAL_TABLET | Freq: Every day | ORAL | 3 refills | Status: DC

## 2020-07-12 NOTE — Patient Instructions (Signed)
Decrease your janumet to once a day   Get your labs done a few days or a week before your next appointment in 4 months and we'll review it at your appointment

## 2020-08-27 ENCOUNTER — Ambulatory Visit
Admission: RE | Admit: 2020-08-27 | Discharge: 2020-08-27 | Disposition: A | Discharge status: Home / Self Care | Payer: Medicare Other | Source: Ambulatory Visit | Attending: Hematology and Oncology | Admitting: Hematology and Oncology

## 2020-08-27 ENCOUNTER — Other Ambulatory Visit: Payer: Self-pay

## 2020-08-27 DIAGNOSIS — Z8509 Personal history of malignant neoplasm of other digestive organs: Secondary | ICD-10-CM | POA: Diagnosis not present

## 2020-08-27 DIAGNOSIS — Z853 Personal history of malignant neoplasm of breast: Secondary | ICD-10-CM | POA: Insufficient documentation

## 2020-09-10 ENCOUNTER — Ambulatory Visit
Admission: RE | Admit: 2020-09-10 | Discharge: 2020-09-10 | Disposition: A | Discharge status: Home / Self Care | Payer: Medicare Other | Source: Ambulatory Visit | Attending: Hematology and Oncology | Admitting: Hematology and Oncology

## 2020-09-10 ENCOUNTER — Other Ambulatory Visit: Payer: Self-pay

## 2020-09-10 DIAGNOSIS — Z853 Personal history of malignant neoplasm of breast: Secondary | ICD-10-CM | POA: Insufficient documentation

## 2020-09-10 DIAGNOSIS — Z1231 Encounter for screening mammogram for malignant neoplasm of breast: Secondary | ICD-10-CM | POA: Diagnosis not present

## 2020-10-04 ENCOUNTER — Other Ambulatory Visit: Payer: Medicare Other

## 2020-10-04 ENCOUNTER — Ambulatory Visit: Payer: Medicare Other | Admitting: Hematology and Oncology

## 2020-10-17 ENCOUNTER — Other Ambulatory Visit: Payer: Self-pay

## 2020-10-17 DIAGNOSIS — Z8509 Personal history of malignant neoplasm of other digestive organs: Secondary | ICD-10-CM

## 2020-10-17 NOTE — Progress Notes (Deleted)
[facility]  [address] [place], [place] [address] Phone: [phone]  Fax: [phone]   Clinic Day:  10/17/2020  Referring physician: Delsa Grana, PA-C  Chief Complaint: Rachael Castro is a 86 y.o. female with a history of right breast cancer (1999) and gastrointestinal stromal tumor (2013) who is seen for 6 month assessment.  HPI: The patient was last seen in the medical oncology clinic on 03/29/2020. At that time, she denied any abdominal symptoms.  Exam was unremarkable. Hematocrit was 33.0, hemoglobin 10.9, MCV 96.5, platelets 245,000, WBC 7,200. BUN was 26. Ferritin was 58 with an iron saturation of 27% and a TIBC of 329. CA27.29 was 48.9. Vitamin B12 was 946 and folate >100.0. TSH was 1.286. She continued calcium and vitamin D.  Abdomen and pelvis CT without contrast on 08/27/2020 revealed no findings of recurrent malignancy. There was bilateral nonobstructive nephrolithiasis. There was an otherwise nonspecific 1.4 x 1.0 cm hypodense lesion of the right kidney upper pole that appeared to be a cyst back on prior CT of 2017, although was slightly smaller at that time. Today's exam was performed without IV contrast. Other imaging findings of potential clinical significance included coronary atherosclerosis, scarring in the left kidney upper pole posteriorly, and mild lower lumbar spondylosis and degenerative disc disease. There was aortic atherosclerosis.  Screening left mammogram on 09/10/2020 revealed no evidence of malignancy.  During the interim, ***   Past Medical History:  Diagnosis Date  . Breast cancer ([place]) 1999   right breast  . Diabetes mellitus   . GERD (gastroesophageal reflux disease)   . GIST (gastrointestinal stromal tumor), malignant ([place]) 2013  . History of shingles   . Hypercholesteremia   . Hypertension   . Kidney stones   . 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 Osteopenia 12/24/2017    March 2019; next scan March 2021  . Personal history of chemotherapy   . Personal history of colonic polyps   . Thyroid disease     Past Surgical History:  Procedure Laterality Date  . ABDOMINAL HYSTERECTOMY    . APPENDECTOMY    . BREAST BIOPSY Left 09/08/2016   Done by Dr. Jamal Collin  . BREAST EXCISIONAL BIOPSY Right 1999   Mastectomy  . BREAST SURGERY Right 1999   mastectomy  . CATARACT EXTRACTION W/PHACO Left 04/30/2015   Procedure: CATARACT EXTRACTION PHACO AND INTRAOCULAR LENS PLACEMENT (IOC);  Surgeon: Estill Cotta, MD;  Location: [facility];  Service: Ophthalmology;  Laterality: Left;  [place] 01:39 AP% 23.3 CDE 40.42 fluid pack lot # [id number]  . COLONOSCOPY  2008   Sankar  . EUS  02/12/2012   Procedure: UPPER ENDOSCOPIC ULTRASOUND (EUS) LINEAR;  Surgeon: Milus Banister, MD;  Location: [facility];  Service: Endoscopy;  Laterality: N/A;  radial linear  . EYE SURGERY Right 2013   cataract  . HERNIA REPAIR  2012  . LAPAROTOMY  2013   excision of gastric wall mass   . MASTECTOMY    . SALPINGOOPHORECTOMY Right 1979  . THYROIDECTOMY  1978  . TONSILLECTOMY    . TUBAL LIGATION    . UPPER GI ENDOSCOPY  2013    Family History  Problem Relation Age of Onset  . Cancer Mother        cervical  . Cancer Father        prostate  . Kidney Stones Brother   . Glaucoma Brother   . Prostatitis Brother   .  Diabetes Sister   . Thyroid disease Brother   . Cancer Brother        thyroid  . Arthritis Brother   . Diabetes Sister   . Kidney disease Neg Hx   . Bladder Cancer Neg Hx   . Breast cancer Neg Hx     Social History:  reports that she has never smoked. She has never used smokeless tobacco. She reports that she does not drink alcohol and does not use drugs. She lives in [place]. The patient is alone*** today.  Allergies:  Allergies  Allergen Reactions  . Fish Allergy Rash    Rash and vomiting    Current Medications: Current Outpatient Medications  Medication Sig  Dispense Refill  . atorvastatin (LIPITOR) 80 MG tablet TAKE 1 TABLET BY MOUTH EVERYDAY AT BEDTIME 90 tablet 3  . calcium citrate-vitamin D 200-200 MG-UNIT TABS Take 1 tablet by mouth daily.    Marland Kitchen losartan (COZAAR) 25 MG tablet Take 1 tablet (25 mg total) by mouth daily. 90 tablet 3  . Magnesium 100 MG CAPS Take 1 capsule by mouth daily.    . Multiple Vitamin (MULTIVITAMIN) tablet Take 1 tablet by mouth daily.    Marland Kitchen omeprazole (PRILOSEC) 20 MG capsule TAKE 1 CAPSULE (20 MG TOTAL) BY MOUTH DAILY AS NEEDED. 90 capsule 1  . sitaGLIPtin-metformin (JANUMET) 50-500 MG tablet Take 1 tablet by mouth daily. TAKE 1 TABLET BY MOUTH 2 (TWO) TIMES DAILY WITH A MEAL. 90 tablet 3  . thiamine (VITAMIN B-1) 100 MG tablet Take 100 mg by mouth daily.     No current facility-administered medications for this visit.    Review of Systems  Constitutional: Positive for weight loss (7 lbs since last visit). Negative for chills, diaphoresis, fever and malaise/fatigue.       "Doing well".  HENT: Negative.  Negative for congestion, ear pain, hearing loss, nosebleeds, sinus pain and sore throat.   Eyes: Negative.  Negative for blurred vision, double vision and photophobia.  Respiratory: Negative.  Negative for cough, hemoptysis, sputum production and shortness of breath (exertional).   Cardiovascular: Negative.  Negative for chest pain, palpitations, orthopnea, leg swelling and PND.  Gastrointestinal: Positive for diarrhea. Negative for abdominal pain, blood in stool, constipation, melena, nausea and vomiting.  Genitourinary: Negative.  Negative for dysuria, frequency, hematuria and urgency.       BILATERAL nephrolithiasis; small and nonobstructing  Musculoskeletal: Positive for joint pain (B/L shoulders, elbows, and wrists) and neck pain (known cervical neuralgia). Negative for back pain, falls and myalgias.       Reports tightness when she turns her neck to the right.  Skin: Negative for itching and rash (uses topical  cream, improving).  Neurological: Positive for tingling (RIGHT lower extremity; stable). Negative for dizziness, tremors, speech change, focal weakness, seizures, weakness and headaches.  Endo/Heme/Allergies: Negative.  Does not bruise/bleed easily.  Psychiatric/Behavioral: Negative for depression and memory loss. The patient is not nervous/anxious and does not have insomnia.   All other systems reviewed and are negative.   Performance status (ECOG):  1***  Physical Exam Vitals and nursing note reviewed.  Constitutional:      General: She is not in acute distress.    Appearance: She is well-developed. She is not diaphoretic.  HENT:     Head: Normocephalic and atraumatic.     Mouth/Throat:     Pharynx: No oropharyngeal exudate.  Eyes:     General: No scleral icterus.    Conjunctiva/sclera: Conjunctivae normal.  Pupils: Pupils are equal, round, and reactive to light.     Comments: Glasses.  Brown eyes.  Cardiovascular:     Rate and Rhythm: Normal rate and regular rhythm.     Heart sounds: Normal heart sounds.  Pulmonary:     Effort: Pulmonary effort is normal. No respiratory distress.     Breath sounds: Normal breath sounds. No wheezing or rales.  Chest:     Chest wall: No tenderness.  Breasts:     Right: No inverted nipple, mass, nipple discharge, skin change, tenderness or supraclavicular adenopathy.     Left: No inverted nipple, mass, nipple discharge, skin change, tenderness or supraclavicular adenopathy.    Abdominal:     General: Bowel sounds are normal. There is no distension.     Palpations: Abdomen is soft. There is no mass.     Tenderness: There is no abdominal tenderness. There is no guarding or rebound.  Musculoskeletal:        General: Tenderness (left neck) present. Normal range of motion.     Cervical back: Normal range of motion and neck supple.     Comments: Tight muscles of the neck  Lymphadenopathy:     Head:     Right side of head: No preauricular,  posterior auricular or occipital adenopathy.     Left side of head: No preauricular, posterior auricular or occipital adenopathy.     Cervical: No cervical adenopathy.     Upper Body:     Right upper body: No supraclavicular adenopathy.     Left upper body: No supraclavicular adenopathy.     Lower Body: No right inguinal adenopathy. No left inguinal adenopathy.  Skin:    General: Skin is warm and dry.     Coloration: Skin is not pale.     Findings: No erythema or rash.  Neurological:     Mental Status: She is alert and oriented to person, place, and time.  Psychiatric:        Behavior: Behavior normal.        Thought Content: Thought content normal.        Judgment: Judgment normal.    Imaging studies: 04/17/2015:  Abdominal and pelvis CTrevealed postsurgical changes related to the prior gastric resection. There was no evidence of recurrent or metastatic disease. There were multiple nonobstructing bilateral renal calculi measuring up to 2 mm.  02/13/20217:  Abdominal and pelvic CTrevealed a stable exam with no evidence of recurrent disease. There were tiny nonobstructive intrarenal calculi without evidence of hydronephrosis. 06/17/2016:  Chest, abdomen, and pelvis CTrevealed a new solitary mildly enlarged 1.0 cmleft axillary lymph node, nonspecific, nodal metastasis not excluded. There were no additional potential findings of metastatic disease in the chest, abdomen or pelvis. There was no evidence of local tumor recurrence in the stomach.  08/25/2017:  Chest, abdomen, and pelvis CTdemonstrated further enlargement of the LEFT axillary lymph node. Of note, this area was previously biopsied under ultrasound guidance on 08/2016 and found to be benign lymphoid hyperplasia. Axillary node measured 12 mm (previously 10 mm). There was a stable 2 mm lung nodule in the RIGHT middle lobe. There was a 5 mm subcutaneous nodule laterally in the RIGHT chest wall (stable). 08/25/2018:  Chest,  abdomen, and pelvic CT revealed a stable exam. The left axillary lymph nodes described on previous exam appearedstable to decreased in size in the interval. There was no evidence of metastatic disease within the chest, abdomen or pelvis. 08/26/2019:  Chest, abdomen, and pelvis CT  revealed no evidence for metastatic disease.     No visits with results within 3 Day(s) from this visit.  Latest known visit with results is:  Office Visit on 07/12/2020  Component Date Value Ref Range Status  . Hemoglobin A1C 07/12/2020 6.2* 4.0 - 5.6 % Final    Assessment:  Rachael Castro is a 86 y.o. female with a history ofright breast cancerstatus post modified radical mastectomy in 1999 followed by 4 cycles of Adriamycin and Cytoxan. She received tamoxifenthen extended adjuvant therapy with Femara.   Left sided mammogramon 07/19/2015 revealed no evidence of malignancy. Left sided mammogram and ultrasoundon 09/02/2016 revealed a borderline lymph node in the left axilla, partially behind the lateral edge of the pectoralis muscle. Left axillary core needle biopsyon 09/08/2016 revealed benign lymphoid hyperplasia. Left screening mammogram on 09/04/2017 that revealed no mammographic evidence of malignancy.  Left screening mammogram on 09/06/2018 revealed no evidence of malignancy.  Left screening mammogram on 09/08/2019 revealed no evidence of malignancy. Screening left mammogram on 09/10/2020 revealed no evidence of malignancy.  CA27.29was 46.6 on 05/30/2015, 41.9 on 07/09/2015, 42.9 on 11/05/2015, 46.2 on 12/05/2015, 53.5 on 06/04/2016, 50.0 on 08/06/2016, 43.7 on 02/04/2017, 39.2 on 08/05/2017, 44.6 on 02/03/2018, 50.4 on 08/25/2018, 53 on 03/30/2019, 43.4 on 09/28/2019, and 48.9 on 03/29/2020.  She has a history of gastrointestinal stromal tumor (GIST) status post resection on 02/27/2012. Pathology revealed a 6.4 cm tumor. She received a year of Gleevecbeginning 03/2012. Her dose was reduced  secondary to side effects.  Chest, abdomen, and pelvis CT on 08/26/2019 revealed no evidence for metastatic disease.  Abdomen and pelvis CT without contrast on 08/27/2020 revealed no findings of recurrent malignancy. There was bilateral nonobstructive nephrolithiasis. There was an otherwise nonspecific 1.4 x 1.0 cm hypodense lesion of the right kidney upper pole that appeared to be a cyst back on prior CT of 2017, although was slightly smaller at that time.  She has a normocytic anemia.  Anemia work-up on 03/29/2020 revealed the following normal studies: ferritin (58), iron studies, B12 (946), folate (>100.0) and TSH (1.286).  Bone densityon 12/24/2017 revealed osteopeniawith a T score of -1.8 in the left femoral neck.  Bone density on 01/26/2020 revealed osteopenia with a T-score of -2.3 at the left neck femur.   Symptomatically, ***  Plan: 1.   Labs today: CBC with diff, CMP, CA27.29.   2.  Right breast cancer Clinically, she is doing well. Exam reveals no evidence of recurrent disease Left screening mammogram on 09/08/2019 revealed no evidence of malignancy. CA27.29 is 48.9. CA 27.29 fluctuates.  Prior imaging studies with CA27.29 from 39.2 - 53.5. Continue to monitor. 3.  Gastrointestinal stromal cancer Clinically, she continues to do well Chest, abdomen, and pelvis CT on 08/26/2019 revealed no evidence for metastatic disease. Discuss plan for ongoing imaging yearly. Continue to monitor 4.   Osteopenia Review interval bone density study. Continue calcium and vitamin D. 5.   Normocytic anemia  Hematocrit 33.0.  Hemoglobin 10.9.  Anemia work-up sent:  ferritin, iron studies, B12, folate, TSH. 6.   Mammogram on 09/07/2020. 7.   Chest, abdomen, and pelvis CT 08/27/2020. 8.   RTC in 6 months for MD assessment, labs (CBC with diff, CMP, CA27.29), and review of imaging.  I discussed the assessment and treatment plan with the patient.  The patient was provided an  opportunity to ask questions and all were answered.  The patient agreed with the plan and demonstrated an understanding of the instructions.  The patient  was advised to call back or seek an in person evaluation if the symptoms worsen or if the condition fails to improve as anticipated.  I provided *** minutes of face-to-face time during this this encounter and > 50% was spent counseling as documented under my assessment and plan.  Nolon Stalls, MD, PhD  10/17/2020, 11:43 AM  I, Mirian Mo Tufford, am acting as Education administrator for [employer]. Mike Gip, MD, PhD.  I, Sarra Rachels C. Mike Gip, MD, have reviewed the above documentation for accuracy and completeness, and I agree with the above.

## 2020-10-18 ENCOUNTER — Inpatient Hospital Stay: Payer: Medicare Other | Attending: Hematology and Oncology

## 2020-10-18 ENCOUNTER — Inpatient Hospital Stay: Payer: Medicare Other | Admitting: Hematology and Oncology

## 2020-10-18 DIAGNOSIS — D649 Anemia, unspecified: Secondary | ICD-10-CM | POA: Insufficient documentation

## 2020-11-01 ENCOUNTER — Ambulatory Visit (INDEPENDENT_AMBULATORY_CARE_PROVIDER_SITE_OTHER): Payer: Medicare Other

## 2020-11-01 DIAGNOSIS — Z0000 Encounter for general adult medical examination without abnormal findings: Secondary | ICD-10-CM

## 2020-11-01 NOTE — Patient Instructions (Signed)
Rachael Castro , Thank you for taking time to come for your Medicare Wellness Visit. I appreciate your ongoing commitment to your health goals. Please review the following plan we discussed and let me know if I can assist you in the future.   Screening recommendations/referrals: Colonoscopy: no longer required Mammogram: done 09/10/20 Bone Density: done 01/26/20 Recommended yearly ophthalmology/optometry visit for glaucoma screening and checkup Recommended yearly dental visit for hygiene and checkup  Vaccinations: Influenza vaccine: done 07/12/20 Pneumococcal vaccine: done 11/24/13 Tdap vaccine: due Shingles vaccine: done 01/05/19 & 02/22/20   Covid-19:done 11/14/19, 12/05/19 & 07/16/20   Advanced directives: Please bring a copy of your health care power of attorney and living will to the office at your convenience.  Conditions/risks identified: Recommend increasing physical activity   Next appointment: Follow up in one year for your annual wellness visit    Preventive Care 65 Years and Older, Female Preventive care refers to lifestyle choices and visits with your health care provider that can promote health and wellness. What does preventive care include?  A yearly physical exam. This is also called an annual well check.  Dental exams once or twice a year.  Routine eye exams. Ask your health care provider how often you should have your eyes checked.  Personal lifestyle choices, including:  Daily care of your teeth and gums.  Regular physical activity.  Eating a healthy diet.  Avoiding tobacco and drug use.  Limiting alcohol use.  Practicing safe sex.  Taking low-dose aspirin every day.  Taking vitamin and mineral supplements as recommended by your health care provider. What happens during an annual well check? The services and screenings done by your health care provider during your annual well check will depend on your age, overall health, lifestyle risk factors, and family  history of disease. Counseling  Your health care provider may ask you questions about your:  Alcohol use.  Tobacco use.  Drug use.  Emotional well-being.  Home and relationship well-being.  Sexual activity.  Eating habits.  History of falls.  Memory and ability to understand (cognition).  Work and work Statistician.  Reproductive health. Screening  You may have the following tests or measurements:  Height, weight, and BMI.  Blood pressure.  Lipid and cholesterol levels. These may be checked every 5 years, or more frequently if you are over 75 years old.  Skin check.  Lung cancer screening. You may have this screening every year starting at age 46 if you have a 30-pack-year history of smoking and currently smoke or have quit within the past 15 years.  Fecal occult blood test (FOBT) of the stool. You may have this test every year starting at age 51.  Flexible sigmoidoscopy or colonoscopy. You may have a sigmoidoscopy every 5 years or a colonoscopy every 10 years starting at age 21.  Hepatitis C blood test.  Hepatitis B blood test.  Sexually transmitted disease (STD) testing.  Diabetes screening. This is done by checking your blood sugar (glucose) after you have not eaten for a while (fasting). You may have this done every 1-3 years.  Bone density scan. This is done to screen for osteoporosis. You may have this done starting at age 73.  Mammogram. This may be done every 1-2 years. Talk to your health care provider about how often you should have regular mammograms. Talk with your health care provider about your test results, treatment options, and if necessary, the need for more tests. Vaccines  Your health care provider may  recommend certain vaccines, such as:  Influenza vaccine. This is recommended every year.  Tetanus, diphtheria, and acellular pertussis (Tdap, Td) vaccine. You may need a Td booster every 10 years.  Zoster vaccine. You may need this after  age 65.  Pneumococcal 13-valent conjugate (PCV13) vaccine. One dose is recommended after age 11.  Pneumococcal polysaccharide (PPSV23) vaccine. One dose is recommended after age 109. Talk to your health care provider about which screenings and vaccines you need and how often you need them. This information is not intended to replace advice given to you by your health care provider. Make sure you discuss any questions you have with your health care provider. Document Released: 11/02/2015 Document Revised: 06/25/2016 Document Reviewed: 08/07/2015 Elsevier Interactive Patient Education  2017 [place] Prevention in the Home Falls can cause injuries. They can happen to people of all ages. There are many things you can do to make your home safe and to help prevent falls. What can I do on the outside of my home?  Regularly fix the edges of walkways and driveways and fix any cracks.  Remove anything that might make you trip as you walk through a door, such as a raised step or threshold.  Trim any bushes or trees on the path to your home.  Use bright outdoor lighting.  Clear any walking paths of anything that might make someone trip, such as rocks or tools.  Regularly check to see if handrails are loose or broken. Make sure that both sides of any steps have handrails.  Any raised decks and porches should have guardrails on the edges.  Have any leaves, snow, or ice cleared regularly.  Use sand or salt on walking paths during winter.  Clean up any spills in your garage right away. This includes oil or grease spills. What can I do in the bathroom?  Use night lights.  Install grab bars by the toilet and in the tub and shower. Do not use towel bars as grab bars.  Use non-skid mats or decals in the tub or shower.  If you need to sit down in the shower, use a plastic, non-slip stool.  Keep the floor dry. Clean up any water that spills on the floor as soon as it  happens.  Remove soap buildup in the tub or shower regularly.  Attach bath mats securely with double-sided non-slip rug tape.  Do not have throw rugs and other things on the floor that can make you trip. What can I do in the bedroom?  Use night lights.  Make sure that you have a light by your bed that is easy to reach.  Do not use any sheets or blankets that are too big for your bed. They should not hang down onto the floor.  Have a firm chair that has side arms. You can use this for support while you get dressed.  Do not have throw rugs and other things on the floor that can make you trip. What can I do in the kitchen?  Clean up any spills right away.  Avoid walking on wet floors.  Keep items that you use a lot in easy-to-reach places.  If you need to reach something above you, use a strong step stool that has a grab bar.  Keep electrical cords out of the way.  Do not use floor polish or wax that makes floors slippery. If you must use wax, use non-skid floor wax.  Do not have throw rugs and  other things on the floor that can make you trip. What can I do with my stairs?  Do not leave any items on the stairs.  Make sure that there are handrails on both sides of the stairs and use them. Fix handrails that are broken or loose. Make sure that handrails are as long as the stairways.  Check any carpeting to make sure that it is firmly attached to the stairs. Fix any carpet that is loose or worn.  Avoid having throw rugs at the top or bottom of the stairs. If you do have throw rugs, attach them to the floor with carpet tape.  Make sure that you have a light switch at the top of the stairs and the bottom of the stairs. If you do not have them, ask someone to add them for you. What else can I do to help prevent falls?  Wear shoes that:  Do not have high heels.  Have rubber bottoms.  Are comfortable and fit you well.  Are closed at the toe. Do not wear sandals.  If you  use a stepladder:  Make sure that it is fully opened. Do not climb a closed stepladder.  Make sure that both sides of the stepladder are locked into place.  Ask someone to hold it for you, if possible.  Clearly mark and make sure that you can see:  Any grab bars or handrails.  First and last steps.  Where the edge of each step is.  Use tools that help you move around (mobility aids) if they are needed. These include:  Canes.  Walkers.  Scooters.  Crutches.  Turn on the lights when you go into a dark area. Replace any light bulbs as soon as they burn out.  Set up your furniture so you have a clear path. Avoid moving your furniture around.  If any of your floors are uneven, fix them.  If there are any pets around you, be aware of where they are.  Review your medicines with your doctor. Some medicines can make you feel dizzy. This can increase your chance of falling. Ask your doctor what other things that you can do to help prevent falls. This information is not intended to replace advice given to you by your health care provider. Make sure you discuss any questions you have with your health care provider. Document Released: 08/02/2009 Document Revised: 03/13/2016 Document Reviewed: 11/10/2014 Elsevier Interactive Patient Education  2017 [employer].

## 2020-11-01 NOTE — Progress Notes (Signed)
Subjective:   Rachael Castro is a 87 y.o. female who presents for Medicare Annual (Subsequent) preventive examination.  Virtual Visit via Telephone Note  I connected with  Rachael Castro on 11/01/20 at 10:00 AM EST by telephone and verified that I am speaking with the correct person using two identifiers.  Location: Patient: home Provider: [facility] Persons participating in the virtual visit: patient/Nurse Health Advisor   I discussed the limitations, risks, security and privacy concerns of performing an evaluation and management service by telephone and the availability of in person appointments. The patient expressed understanding and agreed to proceed.  Interactive audio and video telecommunications were attempted between this nurse and patient, however failed, due to patient having technical difficulties OR patient did not have access to video capability.  We continued and completed visit with audio only.  Some vital signs may be absent or patient reported.   Reather LittlerKasey Lauralee Waters, LPN    Review of Systems     Cardiac Risk Factors include: advanced age (>5855men, 40>65 women);diabetes mellitus;dyslipidemia;hypertension     Objective:    Today's Vitals   11/01/20 1012  PainSc: 4    There is no height or weight on file to calculate BMI.  Advanced Directives 11/01/2020 10/28/2019 09/28/2019 03/30/2019 09/21/2018 09/01/2018 08/07/2017  Does Patient Have a Medical Advance Directive? Yes Yes No No No No No  Type of Estate agentAdvance Directive Healthcare Power of [place]orney;Living will Healthcare Power of [place]orney;Living will - - - - -  Copy of Healthcare Power of Attorney in Chart? No - copy requested No - copy requested - - - - -  Would patient like information on creating a medical advance directive? - - No - Patient declined No - Patient declined Yes (MAU/Ambulatory/Procedural Areas - Information given) No - Patient declined -  Pre-existing out of facility DNR order (yellow form or pink MOST form) - -  - - - - -    Current Medications (verified) Outpatient Encounter Medications as of 11/01/2020  Medication Sig  . atorvastatin (LIPITOR) 80 MG tablet TAKE 1 TABLET BY MOUTH EVERYDAY AT BEDTIME  . calcium citrate-vitamin D 200-200 MG-UNIT TABS Take 1 tablet by mouth daily.  Marland Kitchen. losartan (COZAAR) 25 MG tablet Take 1 tablet (25 mg total) by mouth daily.  . Magnesium 100 MG CAPS Take 1 capsule by mouth daily.  . Multiple Vitamin (MULTIVITAMIN) tablet Take 1 tablet by mouth daily.  Marland Kitchen. omeprazole (PRILOSEC) 20 MG capsule TAKE 1 CAPSULE (20 MG TOTAL) BY MOUTH DAILY AS NEEDED.  Marland Kitchen. sitaGLIPtin-metformin (JANUMET) 50-500 MG tablet Take 1 tablet by mouth daily. TAKE 1 TABLET BY MOUTH 2 (TWO) TIMES DAILY WITH A MEAL.  Marland Kitchen. thiamine (VITAMIN B-1) 100 MG tablet Take 100 mg by mouth daily.   No facility-administered encounter medications on file as of 11/01/2020.    Allergies (verified) Fish allergy   History: Past Medical History:  Diagnosis Date  . Breast cancer (HCC) 1999   right breast  . Diabetes mellitus   . GERD (gastroesophageal reflux disease)   . GIST (gastrointestinal stromal tumor), malignant (HCC) 2013  . History of shingles   . Hypercholesteremia   . Hypertension   . Kidney stones   . 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 Osteopenia 12/24/2017   March 2019; next scan March 2021  . Personal history of chemotherapy   . Personal history of colonic polyps   . Thyroid disease    Past Surgical History:  Procedure Laterality Date  . ABDOMINAL HYSTERECTOMY    . APPENDECTOMY    . BREAST BIOPSY Left 09/08/2016   Done by Dr. Evette CristalSankar  . BREAST EXCISIONAL BIOPSY Right 1999   Mastectomy  . BREAST SURGERY Right 1999   mastectomy  . CATARACT EXTRACTION W/PHACO Left 04/30/2015   Procedure: CATARACT EXTRACTION PHACO AND INTRAOCULAR LENS PLACEMENT (IOC);  Surgeon: Sallee LangeSteven Dingeldein, MD;  Location: [facility] ORS;  Service: Ophthalmology;  Laterality: Left;  US 01:39 AP%  23.3 CDE 40.42 fluid pack lot # [id number]  . COLONOSCOPY  2008   Sankar  . EUS  02/12/2012   Procedure: UPPER ENDOSCOPIC ULTRASOUND (EUS) LINEAR;  Surgeon: Rachael Feeaniel P Jacobs, MD;  Location: [facility];  Service: Endoscopy;  Laterality: N/A;  radial linear  . EYE SURGERY Right 2013   cataract  . HERNIA REPAIR  2012  . LAPAROTOMY  2013   excision of gastric wall mass   . MASTECTOMY    . SALPINGOOPHORECTOMY Right 1979  . THYROIDECTOMY  1978  . TONSILLECTOMY    . TUBAL LIGATION    . UPPER GI ENDOSCOPY  2013   Family History  Problem Relation Age of Onset  . Cancer Mother        cervical  . Cancer Father        prostate  . Kidney Stones Brother   . Glaucoma Brother   . Prostatitis Brother   . Diabetes Sister   . Thyroid disease Brother   . Cancer Brother        thyroid  . Arthritis Brother   . Diabetes Sister   . Kidney disease Neg Hx   . Bladder Cancer Neg Hx   . Breast cancer Neg Hx    Social History   Socioeconomic History  . Marital status: Married    Spouse name: Not on file  . Number of children: 1  . Years of education: Not on file  . Highest education level: High school graduate  Occupational History  . Not on file  Tobacco Use  . Smoking status: Never Smoker  . Smokeless tobacco: Never Used  Vaping Use  . Vaping Use: Never used  Substance and Sexual Activity  . Alcohol use: No  . Drug use: No  . Sexual activity: Not Currently  Other Topics Concern  . Not on file  Social History Narrative   Primary caregiver to husband who had a stroke in Aug 2021   Social Determinants of Health   Financial Resource Strain: Medium Risk  . Difficulty of Paying Living Expenses: Somewhat hard  Food Insecurity: No Food Insecurity  . Worried About Programme researcher, broadcasting/film/videounning Out of Food in the Last Year: Never true  . Ran Out of Food in the Last Year: Never true  Transportation Needs: No Transportation Needs  . Lack of Transportation (Medical): No  . Lack of Transportation  (Non-Medical): No  Physical Activity: Inactive  . Days of Exercise per Week: 0 days  . Minutes of Exercise per Session: 0 min  Stress: No Stress Concern Present  . Feeling of Stress : Only a little  Social Connections: Moderately Integrated  . Frequency of Communication with Friends and Family: More than three times a week  . Frequency of Social Gatherings with Friends and Family: More than three times a week  . Attends Religious Services: More than 4 times per year  . Active Member of Clubs or Organizations: No  . Attends BankerClub or Organization Meetings: Never  . Marital Status: Married  Tobacco Counseling Counseling given: Not Answered   Clinical Intake:  Pre-visit preparation completed: Yes  Pain : 0-10 Pain Score: 4  Pain Type: Acute pain Pain Location: Rib cage Pain Orientation: Left Pain Descriptors / Indicators: Sore Pain Onset: 1 to 4 weeks ago Pain Frequency: Constant     Nutritional Risks: None Diabetes: No  How often do you need to have someone help you when you read instructions, pamphlets, or other written materials from your doctor or pharmacy?: 1 - Never  Nutrition Risk Assessment:  Has the patient had any N/V/D within the last 2 months?  No  Does the patient have any non-healing wounds?  No  Has the patient had any unintentional weight loss or weight gain?  No   Diabetes:  Is the patient diabetic?  Yes  If diabetic, was a CBG obtained today?  No  Did the patient bring in their glucometer from home?  No  How often do you monitor your CBG's? A few times per week   Financial Strains and Diabetes Management:  Are you having any financial strains with the device, your supplies or your medication? No .  Does the patient want to be seen by Chronic Care Management for management of their diabetes?  No  Would the patient like to be referred to a Nutritionist or for Diabetic Management?  No   Diabetic Exams:  Diabetic Eye Exam: Completed 01/18/20  [facility].   Diabetic Foot Exam: Completed 01/10/20.     Interpreter Needed?: No  Information entered by :: Clemetine Marker LPN   Activities of Daily Living In your present state of health, do you have any difficulty performing the following activities: 11/01/2020 07/12/2020  Hearing? N N  Comment declines hearing aids -  Vision? N N  Difficulty concentrating or making decisions? N N  Walking or climbing stairs? N N  Dressing or bathing? N N  Doing errands, shopping? N N  Preparing Food and eating ? N -  Using the Toilet? N -  In the past six months, have you accidently leaked urine? N -  Do you have problems with loss of bowel control? N -  Managing your Medications? N -  Managing your Finances? N -  Housekeeping or managing your Housekeeping? N -  Some recent data might be hidden    Patient Care Team: Delsa Grana, PA-C as PCP - General (Family Medicine) Lada, Satira Anis, MD as Attending Physician (Family Medicine) Lequita Asal, MD as Referring Physician (Hematology and Oncology) Deboraha Sprang, MD as Consulting Physician (Cardiology) Laneta Simmers as Physician Assistant (Urology) Bary Castilla, Forest Gleason, MD (General Surgery) Virgel Manifold, MD as Consulting Physician (Gastroenterology)  Indicate any recent Medical Services you may have received from other than [facility] providers in the past year (date may be approximate).     Assessment:   This is a routine wellness examination for [place].  Hearing/Vision screen  Hearing Screening   125Hz  250Hz  500Hz  1000Hz  2000Hz  3000Hz  4000Hz  6000Hz  8000Hz   Right ear:           Left ear:           Comments: Pt denies hearing difficulty   Vision Screening Comments: Annual vision screenings done by Dr. Thomasene [facility]  Dietary issues and exercise activities discussed: Current Exercise Habits: The patient does not participate in regular exercise at present, Exercise limited by: None  identified  Goals    . DIET - INCREASE WATER  INTAKE     Recommend drinking 6-8 glasses of water per day     . Increase physical activity     Increase physical activity to 90 minutes per week.    . Prevent falls      Depression Screen PHQ 2/9 Scores 11/01/2020 07/12/2020 01/10/2020 10/28/2019 09/12/2019 06/10/2019 04/06/2019  PHQ - 2 Score 0 0 0 0 0 0 0  PHQ- 9 Score - - 0 - 0 0 0  Exception Documentation - - - - - - -    Fall Risk Fall Risk  11/01/2020 07/12/2020 01/10/2020 10/28/2019 09/20/2019  Falls in the past year? 0 0 0 0 0  Number falls in past yr: 0 0 0 0 0  Injury with Fall? 0 0 0 0 0  Comment - - - - -  Risk for fall due to : No Fall Risks - - No Fall Risks -  Follow up Falls prevention discussed Falls evaluation completed - Falls prevention discussed -  Comment - - - - -    FALL RISK PREVENTION PERTAINING TO THE HOME:  Any stairs in or around the home? Yes  If so, are there any without handrails? Yes  2 steps outside in back; ramp out front  Home free of loose throw rugs in walkways, pet beds, electrical cords, etc? Yes  Adequate lighting in your home to reduce risk of falls? Yes   ASSISTIVE DEVICES UTILIZED TO PREVENT FALLS:  Life alert? No  Use of a cane, walker or w/c? No  Grab bars in the bathroom? Yes  Shower chair or bench in shower? Yes  Elevated toilet seat or a handicapped toilet? Yes   TIMED UP AND GO:  Was the test performed? No . Telephonic visit.  Cognitive Function:     6CIT Screen 11/01/2020 10/28/2019 09/21/2018 09/21/2017  What Year? 0 points 0 points 0 points 0 points  What month? 0 points 0 points 0 points 0 points  What time? 0 points 0 points 0 points 0 points  Count back from 20 0 points 0 points 0 points 0 points  Months in reverse 0 points 0 points 0 points 0 points  Repeat phrase 0 points 2 points 2 points 2 points  Total Score 0 2 2 2     Immunizations Immunization History  Administered Date(s) Administered  . Fluad Quad(high Dose  65+) 06/10/2019, 07/12/2020  . Influenza, High Dose Seasonal PF 06/18/2015, 09/04/2016  . Influenza-Unspecified 07/23/2017, 06/02/2018  . PFIZER SARS-COV-2 Vaccination 11/14/2019, 12/05/2019, 07/16/2020  . Zoster Recombinat (Shingrix) 01/05/2019, 02/22/2020    TDAP status: Due, Education has been provided regarding the importance of this vaccine. Advised may receive this vaccine at local pharmacy or Health Dept. Aware to provide a copy of the vaccination record if obtained from local pharmacy or Health Dept. Verbalized acceptance and understanding.  Flu Vaccine status: Up to date  Pneumococcal vaccine status: Up to date  Covid-19 vaccine status: Completed vaccines  Qualifies for Shingles Vaccine? Yes   Zostavax completed Yes   Shingrix Completed?: Yes  Screening Tests Health Maintenance  Topic Date Due  . FOOT EXAM  01/09/2021  . HEMOGLOBIN A1C  01/09/2021  . OPHTHALMOLOGY EXAM  01/17/2021  . MAMMOGRAM  09/10/2021  . DEXA SCAN  01/25/2022  . TETANUS/TDAP  04/26/2022  . INFLUENZA VACCINE  Completed  . COVID-19 Vaccine  Completed  . PNA vac Low Risk Adult  Completed  . COLONOSCOPY (Pts 45-4yrs Insurance coverage will need to be confirmed)  Discontinued  Health Maintenance  There are no preventive care reminders to display for this patient.  Colorectal cancer screening: No longer required.   Mammogram status: Completed 09/10/20. Repeat every year  Bone Density status: Completed 01/26/20. Results reflect: Bone density results: OSTEOPENIA. Repeat every 2 years.  Lung Cancer Screening: (Low Dose CT Chest recommended if Age 31-80 years, 30 pack-year currently smoking OR have quit w/in 15years.) does not qualify.   Additional Screening:  Hepatitis C Screening: does not qualify  Vision Screening: Recommended annual ophthalmology exams for early detection of glaucoma and other disorders of the eye. Is the patient up to date with their annual eye exam?  Yes  Who is the  provider or what is the name of the office in which the patient attends annual eye exams? [place] Screening: Recommended annual dental exams for proper oral hygiene  Community Resource Referral / Chronic Care Management: CRR required this visit?  No   CCM required this visit?  No      Plan:     I have personally reviewed and noted the following in the patient's chart:   . Medical and social history . Use of alcohol, tobacco or illicit drugs  . Current medications and supplements . Functional ability and status . Nutritional status . Physical activity . Advanced directives . List of other physicians . Hospitalizations, surgeries, and ER visits in previous 12 months . Vitals . Screenings to include cognitive, depression, and falls . Referrals and appointments  In addition, I have reviewed and discussed with patient certain preventive protocols, quality metrics, and best practice recommendations. A written personalized care plan for preventive services as well as general preventive health recommendations were provided to patient.     Clemetine Marker, LPN   3/71/0626   Nurse Notes: pt states she was assisting her husband 10-12 days ago and caught herself on a chair and bruised left side and ribs. Went to urgent care but feeling better now, still sore. Pt scheduled for follow up on 11/12/20.

## 2020-11-12 ENCOUNTER — Ambulatory Visit: Payer: Medicare Other | Admitting: Family Medicine

## 2020-11-27 ENCOUNTER — Ambulatory Visit (INDEPENDENT_AMBULATORY_CARE_PROVIDER_SITE_OTHER): Payer: Medicare Other | Admitting: Family Medicine

## 2020-11-27 ENCOUNTER — Other Ambulatory Visit: Payer: Self-pay

## 2020-11-27 ENCOUNTER — Encounter: Payer: Self-pay | Admitting: Family Medicine

## 2020-11-27 VITALS — BP 116/82 | HR 72 | Temp 97.7°F | Resp 16 | Ht 64.0 in | Wt 115.8 lb

## 2020-11-27 DIAGNOSIS — M85852 Other specified disorders of bone density and structure, left thigh: Secondary | ICD-10-CM

## 2020-11-27 DIAGNOSIS — K219 Gastro-esophageal reflux disease without esophagitis: Secondary | ICD-10-CM

## 2020-11-27 DIAGNOSIS — R202 Paresthesia of skin: Secondary | ICD-10-CM

## 2020-11-27 DIAGNOSIS — E119 Type 2 diabetes mellitus without complications: Secondary | ICD-10-CM

## 2020-11-27 DIAGNOSIS — Z5181 Encounter for therapeutic drug level monitoring: Secondary | ICD-10-CM

## 2020-11-27 DIAGNOSIS — D649 Anemia, unspecified: Secondary | ICD-10-CM

## 2020-11-27 DIAGNOSIS — R634 Abnormal weight loss: Secondary | ICD-10-CM

## 2020-11-27 DIAGNOSIS — E782 Mixed hyperlipidemia: Secondary | ICD-10-CM

## 2020-11-27 DIAGNOSIS — I10 Essential (primary) hypertension: Secondary | ICD-10-CM | POA: Diagnosis not present

## 2020-11-27 DIAGNOSIS — C49A0 Gastrointestinal stromal tumor, unspecified site: Secondary | ICD-10-CM

## 2020-11-27 MED ORDER — OMEPRAZOLE 20 MG PO CPDR: 20.0000 mg | DELAYED_RELEASE_CAPSULE | Freq: Every day | ORAL | 1 refills | Status: DC | PRN

## 2020-11-27 MED ORDER — JANUMET 50-500 MG PO TABS: 1.0000 | ORAL_TABLET | Freq: Every day | ORAL | 3 refills | Status: DC

## 2020-11-27 NOTE — Progress Notes (Signed)
Name: Rachael Castro   MRN: [id number]    DOB: 02-Aug-1931   Date:11/27/2020       Progress Note  Chief Complaint  Patient presents with  . Follow-up  . Diabetes  . Hyperlipidemia  . Hypertension     Subjective:   Rachael Castro is a 87 y.o. female, presents to clinic for routine f/up  Some weight loss, busy not eating as much as she was before, taking care of husband Weight down about 5 lbs over the past year Wt Readings from Last 5 Encounters:  11/27/20 115 lb 12.8 oz (52.5 kg)  07/12/20 117 lb 9.6 oz (53.3 kg)  03/28/20 119 lb 4.3 oz (54.1 kg)  01/10/20 120 lb 3.2 oz (54.5 kg)  10/28/19 112 lb 8 oz (51 kg)   BMI Readings from Last 5 Encounters:  11/27/20 19.88 kg/m  07/12/20 20.19 kg/m  03/28/20 20.47 kg/m  01/10/20 20.63 kg/m  10/28/19 19.31 kg/m   Leg and feet tingling burning and leg cramps Normocytic anemia - last June- labs done at that time showed normal  b12, b1 iron panel normal  She reports that she was given gabapentin in the past for chronic muscle skeletal pain and she tolerated it well but it did not help her pain, she has never had any work-up for any neuropathy or paresthesias, she has never had any diagnosis of restless leg before  DM:   Pt managing DM with Janumet 50-500 mg tablet daily, her diabetes has been well controlled so we were able to decrease her dose from twice daily to daily Reports good med compliance Pt has no SE from meds. Blood sugars well controlled monitoring occasionally in the low 100's Denies: Polyuria, polydipsia, vision changes, hypoglycemia Recent pertinent labs: Lab Results  Component Value Date   HGBA1C 6.2 (A) 07/12/2020   HGBA1C 6.4 (A) 01/10/2020   HGBA1C 6.5 06/10/2019   Standard of care and health maintenance: Foot exam: Up-to-date but due in a few months DM eye exam: Due end of March ACEI/ARB: Losartan Statin: Good Lipitor compliance  Hypertension:  Currently managed on losartan 25 mg once  daily Pt reports good med compliance and denies any SE.   Blood pressure today is well controlled. BP Readings from Last 3 Encounters:  11/27/20 116/82  07/12/20 120/76  03/28/20 (!) 145/70   Pt denies CP, SOB, exertional sx, LE edema, palpitation, Ha's, visual disturbances, lightheadedness, hypotension, syncope.   Hyperlipidemia: Currently treated with Lipitor 80 mg -she was previously instructed to take it daily at bedtime but she kept forgetting so when she recently consulted with someone from the pharmacy they told her to just take it in the mornings and this has improved her med compliance Last Lipids: Lab Results  Component Value Date   CHOL 176 09/20/2019   HDL 60 09/20/2019   LDLCALC 94 09/20/2019   TRIG 128 09/20/2019   CHOLHDL 2.9 09/20/2019   - Denies: Chest pain, shortness of breath, myalgias, claudication   Acid reflux/GERD: She reports that she takes omeprazole daily and if she skips any doses she does have burning and reflux which is bothersome she has not been able to get off of it she does continue to have some food triggers which she avoids she is not having any decreased appetite, abdominal pain, nausea or vomiting, she denies any change in her bowels, melena or hematochezia     Current Outpatient Medications:  .  atorvastatin (LIPITOR) 80 MG tablet, TAKE 1 TABLET BY  MOUTH EVERYDAY AT BEDTIME, Disp: 90 tablet, Rfl: 3 .  calcium citrate-vitamin D 200-200 MG-UNIT TABS, Take 1 tablet by mouth daily., Disp: , Rfl:  .  losartan (COZAAR) 25 MG tablet, Take 1 tablet (25 mg total) by mouth daily., Disp: 90 tablet, Rfl: 3 .  Magnesium 100 MG CAPS, Take 1 capsule by mouth daily., Disp: , Rfl:  .  Multiple Vitamin (MULTIVITAMIN) tablet, Take 1 tablet by mouth daily., Disp: , Rfl:  .  omeprazole (PRILOSEC) 20 MG capsule, TAKE 1 CAPSULE (20 MG TOTAL) BY MOUTH DAILY AS NEEDED., Disp: 90 capsule, Rfl: 1 .  sitaGLIPtin-metformin (JANUMET) 50-500 MG tablet, Take 1 tablet by  mouth daily. TAKE 1 TABLET BY MOUTH 2 (TWO) TIMES DAILY WITH A MEAL., Disp: 90 tablet, Rfl: 3 .  thiamine (VITAMIN B-1) 100 MG tablet, Take 100 mg by mouth daily., Disp: , Rfl:   Patient Active Problem List   Diagnosis Date Noted  . Normocytic anemia 10/18/2020  . Impingement syndrome of shoulder region 05/16/2019  . Osteopenia 12/24/2017  . Hypomagnesemia 09/01/2016  . Hyperlipidemia 10/23/2015  . Type 2 diabetes mellitus ([place]) 10/23/2015  . Hypertension 06/18/2015  . H/O malignant gastrointestinal stromal tumor (GIST) 05/30/2015  . Postherpetic neuralgia 04/17/2015  . DDD (degenerative disc disease), lumbar 04/17/2015  . DDD (degenerative disc disease), lumbar 04/17/2015  . Personal history of malignant neoplasm of breast 07/04/2013  . History of colonic polyps     Past Surgical History:  Procedure Laterality Date  . ABDOMINAL HYSTERECTOMY    . APPENDECTOMY    . BREAST BIOPSY Left 09/08/2016   Done by Dr. Jamal Collin  . BREAST EXCISIONAL BIOPSY Right 1999   Mastectomy  . BREAST SURGERY Right 1999   mastectomy  . CATARACT EXTRACTION W/PHACO Left 04/30/2015   Procedure: CATARACT EXTRACTION PHACO AND INTRAOCULAR LENS PLACEMENT (IOC);  Surgeon: Estill Cotta, MD;  Location: [facility];  Service: Ophthalmology;  Laterality: Left;  [place] 01:39 AP% 23.3 CDE 40.42 fluid pack lot # [id number]  . COLONOSCOPY  2008   Sankar  . EUS  02/12/2012   Procedure: UPPER ENDOSCOPIC ULTRASOUND (EUS) LINEAR;  Surgeon: Milus Banister, MD;  Location: [facility];  Service: Endoscopy;  Laterality: N/A;  radial linear  . EYE SURGERY Right 2013   cataract  . HERNIA REPAIR  2012  . LAPAROTOMY  2013   excision of gastric wall mass   . MASTECTOMY    . SALPINGOOPHORECTOMY Right 1979  . THYROIDECTOMY  1978  . TONSILLECTOMY    . TUBAL LIGATION    . UPPER GI ENDOSCOPY  2013    Family History  Problem Relation Age of Onset  . Cancer Mother        cervical  . Cancer Father        prostate  . Kidney  Stones Brother   . Glaucoma Brother   . Prostatitis Brother   . Diabetes Sister   . Thyroid disease Brother   . Cancer Brother        thyroid  . Arthritis Brother   . Diabetes Sister   . Kidney disease Neg Hx   . Bladder Cancer Neg Hx   . Breast cancer Neg Hx     Social History   Tobacco Use  . Smoking status: Never Smoker  . Smokeless tobacco: Never Used  Vaping Use  . Vaping Use: Never used  Substance Use Topics  . Alcohol use: No  . Drug use: No     Allergies  Allergen Reactions  . Fish Allergy Rash    Rash and vomiting    Health Maintenance  Topic Date Due  . FOOT EXAM  01/09/2021  . HEMOGLOBIN A1C  01/09/2021  . OPHTHALMOLOGY EXAM  01/17/2021  . MAMMOGRAM  09/10/2021  . DEXA SCAN  01/25/2022  . TETANUS/TDAP  04/26/2022  . INFLUENZA VACCINE  Completed  . COVID-19 Vaccine  Completed  . PNA vac Low Risk Adult  Completed  . COLONOSCOPY (Pts 45-42yrs Insurance coverage will need to be confirmed)  Discontinued    Chart Review Today: I personally reviewed active problem list, medication list, allergies, family history, social history, health maintenance, notes from last encounter, lab results, imaging with the patient/caregiver today.   Review of Systems  Constitutional: Negative.   HENT: Negative.   Eyes: Negative.   Respiratory: Negative.   Cardiovascular: Negative.   Gastrointestinal: Negative.   Endocrine: Negative.   Genitourinary: Negative.   Musculoskeletal: Negative.   Skin: Negative.   Allergic/Immunologic: Negative.   Neurological: Negative.   Hematological: Negative.   Psychiatric/Behavioral: Negative.   All other systems reviewed and are negative.    Objective:   Vitals:   11/27/20 1117  BP: 116/82  Pulse: 72  Resp: 16  Temp: 97.7 F (36.5 C)  SpO2: 99%  Weight: 115 lb 12.8 oz (52.5 kg)  Height: 5\' 4"  (1.626 m)    Body mass index is 19.88 kg/m.  Physical Exam Vitals and nursing note reviewed.  Constitutional:       General: She is not in acute distress.    Appearance: Normal appearance. She is well-developed. She is not ill-appearing, toxic-appearing or diaphoretic.     Interventions: Face mask in place.     Comments: Thin elderly female, nontoxic appearing, alert, looks stated age, answering questions appropriately  HENT:     Head: Normocephalic and atraumatic.     Right Ear: External ear normal.     Left Ear: External ear normal.  Eyes:     General: Lids are normal. No scleral icterus.       Right eye: No discharge.        Left eye: No discharge.     Conjunctiva/sclera: Conjunctivae normal.  Neck:     Trachea: Phonation normal. No tracheal deviation.  Cardiovascular:     Rate and Rhythm: Normal rate and regular rhythm.     Pulses: Normal pulses.          Radial pulses are 2+ on the right side and 2+ on the left side.       Posterior tibial pulses are 2+ on the right side and 2+ on the left side.     Heart sounds: Normal heart sounds. No murmur heard. No friction rub. No gallop.   Pulmonary:     Effort: Pulmonary effort is normal. No respiratory distress.     Breath sounds: Normal breath sounds. No stridor. No wheezing, rhonchi or rales.  Chest:     Chest wall: No tenderness.  Abdominal:     General: Bowel sounds are normal. There is no distension.     Palpations: Abdomen is soft.     Tenderness: There is no abdominal tenderness.  Musculoskeletal:     Right lower leg: No edema.     Left lower leg: No edema.  Skin:    General: Skin is warm and dry.     Capillary Refill: Capillary refill takes less than 2 seconds.     Coloration: Skin is not jaundiced or  pale.     Findings: No rash.  Neurological:     Mental Status: She is alert.     Motor: No abnormal muscle tone.     Gait: Gait normal.  Psychiatric:        Mood and Affect: Mood normal.        Speech: Speech normal.        Behavior: Behavior normal.         Assessment & Plan:     ICD-10-CM   1. Type 2 diabetes mellitus  without complication, without long-term current use of insulin (HCC)  E11.9 Hemoglobin A1C    COMPLETE METABOLIC PANEL WITH GFR    Lipid panel    sitaGLIPtin-metformin (JANUMET) 50-500 MG tablet   Has been well controlled, her Janumet dose was decreased, due for follow-up labs  2. Essential hypertension  S06 COMPLETE METABOLIC PANEL WITH GFR   Stable, well-controlled, blood pressure at goal today  3. Mixed hyperlipidemia  T01.6 COMPLETE METABOLIC PANEL WITH GFR    Lipid panel   On statin, Lipitor high-dose, she has improved her compliance by switching to mornings, due for recheck of labs  4. Gastroesophageal reflux disease, unspecified whether esophagitis present  K21.9    Bothersome symptoms she cannot skip any doses of PPI no abdominal pain, melena, hematochezia, she request refills-  will ensure GI follow-up  5. Paresthesia  R20.2 Hemoglobin A1C    COMPLETE METABOLIC PANEL WITH GFR    TSH    CBC with Differential/Platelet    Magnesium   She endorses burning tingling and worsening leg and feet symptoms especially in the evenings, rule out deficiency, hypothyroid -possibly try gabapentin  6. Hypomagnesemia  W10.93 COMPLETE METABOLIC PANEL WITH GFR    Magnesium   History of low magnesium recheck today  7. Normocytic anemia  A35.5 COMPLETE METABOLIC PANEL WITH GFR    TSH    CBC with Differential/Platelet  8. Osteopenia of neck of left femur  D32.202 COMPLETE METABOLIC PANEL WITH GFR   Monitoring  9. Unintentional weight loss  R63.4 Hemoglobin A1C    COMPLETE METABOLIC PANEL WITH GFR    TSH   5 pound weight loss in the last year though she does appear to have lost more weight than this will monitor closely recheck weight in 1 to 2 months  10. Gastrointestinal stromal tumor (GIST) ([place])  C49.A0 omeprazole (PRILOSEC) 20 MG capsule   Monitoring per specialist - oncology Dr. Mike Gip  11. Type 2 diabetes mellitus without complication, without long-term current use of insulin (HCC)  E11.9  Hemoglobin A1C    COMPLETE METABOLIC PANEL WITH GFR    Lipid panel    sitaGLIPtin-metformin (JANUMET) 50-500 MG tablet   Same as above  12. 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 who is ill and she does appear slightly tired and she endorses having to get up and check on him every night, the situational and caregiver stress may be affecting her weight and GI symptoms and ability to care for herself and ensure that she is well  rested and has enough calories we can offer chronic care management social worker or dietitian referral -close follow-up for her weights  RLS - worse sx, not on meds before -will recheck to rule out hypothyroid any B vitamin deficiencies, iron deficiency or any electrolyte abnormalities and if there is nothing to adjust/replace then we may start a trial of medications-would want a close follow-up on her symptoms for this as well  Tried gabapentin in the past with pain management     Return for 2 month f/up RLS/neuropathy and weight loss .   Delsa Grana, PA-C 11/27/20 11:29 AM

## 2020-11-28 ENCOUNTER — Other Ambulatory Visit: Payer: Self-pay | Admitting: Family Medicine

## 2020-11-28 LAB — CBC WITH DIFFERENTIAL/PLATELET
Absolute Monocytes: 328 cells/uL (ref 200–950)
Basophils Absolute: 16 cells/uL (ref 0–200)
Basophils Relative: 0.2 %
Eosinophils Absolute: 88 cells/uL (ref 15–500)
Eosinophils Relative: 1.1 %
HCT: 34.1 % — ABNORMAL LOW (ref 35.0–45.0)
Hemoglobin: 11.2 g/dL — ABNORMAL LOW (ref 11.7–15.5)
Lymphs Abs: 1784 cells/uL (ref 850–3900)
MCH: 31.9 pg (ref 27.0–33.0)
MCHC: 32.8 g/dL (ref 32.0–36.0)
MCV: 97.2 fL (ref 80.0–100.0)
MPV: 10.5 fL (ref 7.5–12.5)
Monocytes Relative: 4.1 %
Neutro Abs: 5784 cells/uL (ref 1500–7800)
Neutrophils Relative %: 72.3 %
Platelets: 255 10*3/uL (ref 140–400)
RBC: 3.51 10*6/uL — ABNORMAL LOW (ref 3.80–5.10)
RDW: 12.7 % (ref 11.0–15.0)
Total Lymphocyte: 22.3 %
WBC: 8 10*3/uL (ref 3.8–10.8)

## 2020-11-28 LAB — COMPLETE METABOLIC PANEL WITH GFR
AG Ratio: 1.5 (calc) (ref 1.0–2.5)
ALT: 12 U/L (ref 6–29)
AST: 18 U/L (ref 10–35)
Albumin: 4.2 g/dL (ref 3.6–5.1)
Alkaline phosphatase (APISO): 72 U/L (ref 37–153)
BUN: 16 mg/dL (ref 7–25)
CO2: 26 mmol/L (ref 20–32)
Calcium: 10.1 mg/dL (ref 8.6–10.4)
Chloride: 105 mmol/L (ref 98–110)
Creat: 0.81 mg/dL (ref 0.60–0.88)
GFR, Est African American: 76 mL/min/{1.73_m2} (ref >=60)
GFR, Est Non African American: 65 mL/min/{1.73_m2} (ref >=60)
Globulin: 2.8 g/dL (calc) (ref 1.9–3.7)
Glucose, Bld: 119 mg/dL — ABNORMAL HIGH (ref 65–99)
Potassium: 5.1 mmol/L (ref 3.5–5.3)
Sodium: 144 mmol/L (ref 135–146)
Total Bilirubin: 0.4 mg/dL (ref 0.2–1.2)
Total Protein: 7 g/dL (ref 6.1–8.1)

## 2020-11-28 LAB — LIPID PANEL
Cholesterol: 176 mg/dL (ref <200)
HDL: 59 mg/dL (ref >=50)
LDL Cholesterol (Calc): 100 mg/dL (calc) — ABNORMAL HIGH
Non-HDL Cholesterol (Calc): 117 mg/dL (calc) (ref <130)
Total CHOL/HDL Ratio: 3 (calc) (ref <5.0)
Triglycerides: 82 mg/dL (ref <150)

## 2020-11-28 LAB — MAGNESIUM: Magnesium: 1.6 mg/dL (ref 1.5–2.5)

## 2020-11-28 LAB — HEMOGLOBIN A1C
Hgb A1c MFr Bld: 6.6 % of total Hgb — ABNORMAL HIGH (ref <5.7)
Mean Plasma Glucose: 143 mg/dL
eAG (mmol/L): 7.9 mmol/L

## 2020-11-28 LAB — TSH: TSH: 1.41 mIU/L (ref 0.40–4.50)

## 2020-11-28 MED ORDER — GABAPENTIN 100 MG PO CAPS: 200.0000 mg | ORAL_CAPSULE | Freq: Every day | ORAL | 1 refills | Status: DC

## 2021-01-21 LAB — HM DIABETES EYE EXAM

## 2021-01-25 ENCOUNTER — Encounter: Payer: Self-pay | Admitting: Family Medicine

## 2021-01-25 ENCOUNTER — Ambulatory Visit: Payer: Medicare Other | Admitting: Family Medicine

## 2021-01-25 ENCOUNTER — Other Ambulatory Visit: Payer: Self-pay

## 2021-01-25 VITALS — BP 120/72 | HR 88 | Temp 98.1°F | Resp 16 | Ht 64.0 in | Wt 116.0 lb

## 2021-01-25 DIAGNOSIS — G2581 Restless legs syndrome: Secondary | ICD-10-CM

## 2021-01-25 DIAGNOSIS — C49A0 Gastrointestinal stromal tumor, unspecified site: Secondary | ICD-10-CM

## 2021-01-25 DIAGNOSIS — E782 Mixed hyperlipidemia: Secondary | ICD-10-CM

## 2021-01-25 DIAGNOSIS — K219 Gastro-esophageal reflux disease without esophagitis: Secondary | ICD-10-CM

## 2021-01-25 DIAGNOSIS — L299 Pruritus, unspecified: Secondary | ICD-10-CM

## 2021-01-25 DIAGNOSIS — B0229 Other postherpetic nervous system involvement: Secondary | ICD-10-CM

## 2021-01-25 DIAGNOSIS — R21 Rash and other nonspecific skin eruption: Secondary | ICD-10-CM

## 2021-01-25 DIAGNOSIS — I10 Essential (primary) hypertension: Secondary | ICD-10-CM

## 2021-01-25 DIAGNOSIS — R202 Paresthesia of skin: Secondary | ICD-10-CM

## 2021-01-25 DIAGNOSIS — R634 Abnormal weight loss: Secondary | ICD-10-CM

## 2021-01-25 DIAGNOSIS — E119 Type 2 diabetes mellitus without complications: Secondary | ICD-10-CM | POA: Diagnosis not present

## 2021-01-25 DIAGNOSIS — L84 Corns and callosities: Secondary | ICD-10-CM

## 2021-01-25 MED ORDER — LOSARTAN POTASSIUM 25 MG PO TABS: 25.0000 mg | ORAL_TABLET | Freq: Every day | ORAL | 3 refills | Status: DC

## 2021-01-25 MED ORDER — LORATADINE 10 MG PO TABS: 10.0000 mg | ORAL_TABLET | Freq: Every day | ORAL | 11 refills | Status: DC

## 2021-01-25 MED ORDER — ATORVASTATIN CALCIUM 80 MG PO TABS: ORAL_TABLET | ORAL | 3 refills | Status: DC

## 2021-01-25 MED ORDER — FAMOTIDINE 20 MG PO TABS: 20.0000 mg | ORAL_TABLET | Freq: Two times a day (BID) | ORAL | 1 refills | Status: DC

## 2021-01-25 NOTE — Progress Notes (Signed)
Name: Rachael Castro   MRN: [id number]    DOB: 01-Dec-1930   Date:01/25/2021       Progress Note  Chief Complaint  Patient presents with  . Follow-up  . Hyperlipidemia  . Rash    On right arm     Subjective:   Rachael Castro is a 87 y.o. female, presents to clinic for f/up  Neuropathy/RLS started gabapentin at bedtime 200 mg working to help decrease her sx- mostly occur at night, better with gabapentin, feels like tingling, cramping sometimes in toes to calves, has to get up walk, drink water and sometimes treats with a tsp of mustard  Appetite good, Weight went up a little, no further weight loss  Wt Readings from Last 5 Encounters:  01/25/21 116 lb (52.6 kg)  11/27/20 115 lb 12.8 oz (52.5 kg)  07/12/20 117 lb 9.6 oz (53.3 kg)  03/28/20 119 lb 4.3 oz (54.1 kg)  01/10/20 120 lb 3.2 oz (54.5 kg)   BMI Readings from Last 5 Encounters:  01/25/21 19.91 kg/m  11/27/20 19.88 kg/m  07/12/20 20.19 kg/m  03/28/20 20.47 kg/m  01/10/20 20.63 kg/m   DM - on janumet last week blood sugars were higher than normal when she checked midday 200+ she says due to her diet otherwise well controlled Due for DM foot exam On statin and losartin Lab Results  Component Value Date   HGBA1C 6.6 (H) 11/27/2020   HTN well controlled on losartain 25 mg daily BP Readings from Last 3 Encounters:  01/25/21 120/72  11/27/20 116/82  07/12/20 120/76    Rash x 2 months- sometimes patches or nodules/welts- sometimes on face, trunk, mostly to arms bilaterally AC are or lower legs -  raised red and itchy comes and goes, denies any new foods, med, detergents, soaps She went to [facility] and got rx triamcinolone cream, she also uses vaseline, no antihistamines or allergy meds or hx   Current Outpatient Medications:  .  atorvastatin (LIPITOR) 80 MG tablet, TAKE 1 TABLET BY MOUTH EVERYDAY AT BEDTIME, Disp: 90 tablet, Rfl: 3 .  calcium citrate-vitamin D 200-200 MG-UNIT TABS, Take 1 tablet by mouth  daily., Disp: , Rfl:  .  gabapentin (NEURONTIN) 100 MG capsule, Take 2 capsules (200 mg total) by mouth at bedtime., Disp: 180 capsule, Rfl: 1 .  losartan (COZAAR) 25 MG tablet, Take 1 tablet (25 mg total) by mouth daily., Disp: 90 tablet, Rfl: 3 .  Magnesium 100 MG CAPS, Take 1 capsule by mouth daily., Disp: , Rfl:  .  Multiple Vitamin (MULTIVITAMIN) tablet, Take 1 tablet by mouth daily., Disp: , Rfl:  .  omeprazole (PRILOSEC) 20 MG capsule, Take 1 capsule (20 mg total) by mouth daily as needed., Disp: 90 capsule, Rfl: 1 .  sitaGLIPtin-metformin (JANUMET) 50-500 MG tablet, Take 1 tablet by mouth daily., Disp: 90 tablet, Rfl: 3 .  thiamine (VITAMIN B-1) 100 MG tablet, Take 100 mg by mouth daily., Disp: , Rfl:   Patient Active Problem List   Diagnosis Date Noted  . Normocytic anemia 10/18/2020  . Impingement syndrome of shoulder region 05/16/2019  . Osteopenia 12/24/2017  . Hypomagnesemia 09/01/2016  . Hyperlipidemia 10/23/2015  . Type 2 diabetes mellitus ([place]) 10/23/2015  . Hypertension 06/18/2015  . H/O malignant gastrointestinal stromal tumor (GIST) 05/30/2015  . Postherpetic neuralgia 04/17/2015  . DDD (degenerative disc disease), lumbar 04/17/2015  . DDD (degenerative disc disease), lumbar 04/17/2015  . Personal history of malignant neoplasm of breast 07/04/2013  . History  of colonic polyps     Past Surgical History:  Procedure Laterality Date  . ABDOMINAL HYSTERECTOMY    . APPENDECTOMY    . BREAST BIOPSY Left 09/08/2016   Done by Dr. Jamal Collin  . BREAST EXCISIONAL BIOPSY Right 1999   Mastectomy  . BREAST SURGERY Right 1999   mastectomy  . CATARACT EXTRACTION W/PHACO Left 04/30/2015   Procedure: CATARACT EXTRACTION PHACO AND INTRAOCULAR LENS PLACEMENT (IOC);  Surgeon: Estill Cotta, MD;  Location: [facility];  Service: Ophthalmology;  Laterality: Left;  [place] 01:39 AP% 23.3 CDE 40.42 fluid pack lot # [id number]  . COLONOSCOPY  2008   Sankar  . EUS  02/12/2012   Procedure:  UPPER ENDOSCOPIC ULTRASOUND (EUS) LINEAR;  Surgeon: Milus Banister, MD;  Location: [facility];  Service: Endoscopy;  Laterality: N/A;  radial linear  . EYE SURGERY Right 2013   cataract  . HERNIA REPAIR  2012  . LAPAROTOMY  2013   excision of gastric wall mass   . MASTECTOMY    . SALPINGOOPHORECTOMY Right 1979  . THYROIDECTOMY  1978  . TONSILLECTOMY    . TUBAL LIGATION    . UPPER GI ENDOSCOPY  2013    Family History  Problem Relation Age of Onset  . Cancer Mother        cervical  . Cancer Father        prostate  . Kidney Stones Brother   . Glaucoma Brother   . Prostatitis Brother   . Diabetes Sister   . Thyroid disease Brother   . Cancer Brother        thyroid  . Arthritis Brother   . Diabetes Sister   . Kidney disease Neg Hx   . Bladder Cancer Neg Hx   . Breast cancer Neg Hx     Social History   Tobacco Use  . Smoking status: Never Smoker  . Smokeless tobacco: Never Used  Vaping Use  . Vaping Use: Never used  Substance Use Topics  . Alcohol use: No  . Drug use: No     Allergies  Allergen Reactions  . Fish Allergy Rash    Rash and vomiting    Health Maintenance  Topic Date Due  . FOOT EXAM  01/09/2021  . INFLUENZA VACCINE  05/20/2021  . HEMOGLOBIN A1C  05/27/2021  . MAMMOGRAM  09/10/2021  . OPHTHALMOLOGY EXAM  01/21/2022  . DEXA SCAN  01/25/2022  . TETANUS/TDAP  04/26/2022  . COVID-19 Vaccine  Completed  . PNA vac Low Risk Adult  Completed  . HPV VACCINES  Aged Out  . COLONOSCOPY (Pts 45-51yrs Insurance coverage will need to be confirmed)  Discontinued    Chart Review Today: I personally reviewed active problem list, medication list, allergies, family history, social history, health maintenance, notes from last encounter, lab results, imaging with the patient/caregiver today.   Review of Systems  Constitutional: Negative.  Negative for activity change, appetite change, chills, diaphoresis, fatigue, fever and unexpected weight change.  HENT:  Negative.   Eyes: Negative.   Respiratory: Negative.  Negative for cough, chest tightness, shortness of breath and wheezing.   Cardiovascular: Negative.   Gastrointestinal: Negative.   Endocrine: Negative.   Genitourinary: Negative.   Musculoskeletal: Negative.  Negative for myalgias.  Skin: Positive for rash. Negative for color change, pallor and wound.  Allergic/Immunologic: Negative.   Neurological: Negative.  Negative for dizziness and weakness.  Hematological: Negative.   Psychiatric/Behavioral: Negative.   All other systems reviewed and are negative.  Objective:   Vitals:   01/25/21 1000  BP: 120/72  Pulse: 88  Resp: 16  Temp: 98.1 F (36.7 C)  SpO2: 99%  Weight: 116 lb (52.6 kg)  Height: 5\' 4"  (1.626 m)    Body mass index is 19.91 kg/m.  Physical Exam Vitals and nursing note reviewed.  Constitutional:      General: She is not in acute distress.    Appearance: Normal appearance. She is well-developed and normal weight. She is not ill-appearing, toxic-appearing or diaphoretic.     Interventions: Face mask in place.     Comments: Well appearing elderly female, appears stated age  HENT:     Head: Normocephalic and atraumatic.     Right Ear: External ear normal.     Left Ear: External ear normal.  Eyes:     General: Lids are normal. No scleral icterus.       Right eye: No discharge.        Left eye: No discharge.     Conjunctiva/sclera: Conjunctivae normal.  Neck:     Trachea: Phonation normal. No tracheal deviation.  Cardiovascular:     Rate and Rhythm: Normal rate and regular rhythm.     Pulses: Normal pulses.          Radial pulses are 2+ on the right side and 2+ on the left side.       Dorsalis pedis pulses are 2+ on the right side and 2+ on the left side.       Posterior tibial pulses are 2+ on the right side and 2+ on the left side.     Heart sounds: Normal heart sounds.  Pulmonary:     Effort: Pulmonary effort is normal.     Breath sounds: Normal  breath sounds.  Abdominal:     General: Bowel sounds are normal.     Palpations: Abdomen is soft.  Musculoskeletal:     Right lower leg: No swelling, tenderness or bony tenderness. No edema.     Left lower leg: No swelling, tenderness or bony tenderness. No edema.  Feet:     Right foot:     Skin integrity: Callus present. No ulcer, blister, skin breakdown, erythema, warmth, dry skin or fissure.     Toenail Condition: Right toenails are normal.     Left foot:     Skin integrity: Callus present. No ulcer, blister, skin breakdown, erythema, warmth, dry skin or fissure.     Toenail Condition: Left toenails are normal.  Skin:    General: Skin is warm and dry.     Coloration: Skin is not jaundiced or pale.     Findings: Erythema and rash present. No lesion.     Comments: Right forearm erythematous mildly edematous area roughly 4x2.5 cm central dry patch with excoriations LE scattered few excoriated small circular 3 mm maculopapular rash  Neurological:     Mental Status: She is alert. Mental status is at baseline.     Motor: No abnormal muscle tone.     Gait: Gait normal.  Psychiatric:        Mood and Affect: Mood normal.        Speech: Speech normal.        Behavior: Behavior normal.     Diabetic Foot Exam - Simple   Simple Foot Form Diabetic Foot exam was performed with the following findings: Yes 01/25/2021 10:30 AM  Visual Inspection No deformities, no ulcerations, no other skin breakdown bilaterally: Yes Sensation Testing Intact  to touch and monofilament testing bilaterally: Yes Pulse Check Posterior Tibialis and Dorsalis pulse intact bilaterally: Yes Comments Calluses bilaterally to great toe and 1st MTP area w/o ulceration blisters lesions, erythema          Assessment & Plan:     ICD-10-CM   1. Unintentional weight loss  R63.4    weight stable - weight up a little, appetite good  2. Postherpetic neuralgia  B02.29    resolved  3. Type 2 diabetes mellitus without  complication, without long-term current use of insulin (HCC)  E11.9 losartan (COZAAR) 25 MG tablet   well controlled, stable, labs reviewed from last OV 2/22, foot exam done today  4. Essential hypertension  I10 losartan (COZAAR) 25 MG tablet   stable, well controlled, BP at goal today on losartan 25 mg daily  5. Mixed hyperlipidemia  E78.2 atorvastatin (LIPITOR) 80 MG tablet   stable and well controlled on statin, no SE or concerns, recent labs reviewed and lipids at goal  6. Gastroesophageal reflux disease, unspecified whether esophagitis present  K21.9    well controlled on omeprazole  7. Gastrointestinal stromal tumor (GIST) ([place])  C49.A0    Monitoring per specialist - oncology Dr. Mike Gip  8. Pruritic condition  L29.9 loratadine (CLARITIN) 10 MG tablet    famotidine (PEPCID) 20 MG tablet   for months, no obvious cause, scattered no particular pattern, try antihistamine and add pepcid if not improving, f/up in 2-4 weeks if not better  9. Rash and nonspecific skin eruption  R21 loratadine (CLARITIN) 10 MG tablet    famotidine (PEPCID) 20 MG tablet   same as above - if not improving plan to refer to derm or allergist  10. Restless leg syndrome  G25.81    sx are decreased with gabapentin at bedtime  11. Paresthesia of both lower extremities  R20.2    sx improved with gabapentin, last OV r/o deficiency and thyroid dysfunction, cbc normal  12. Callus of foot  L84    bilateral - refer to podiatry, no current pain, wounds, ulcerations - however calluses very thick, esp on left foot     Return for routine f/up early august dm htn hld - sooner as needed for rash if not improving.   Delsa Grana, PA-C 01/25/21 10:13 AM

## 2021-02-21 ENCOUNTER — Ambulatory Visit: Payer: Medicare Other | Admitting: Podiatry

## 2021-02-21 ENCOUNTER — Encounter: Payer: Self-pay | Admitting: Podiatry

## 2021-02-21 ENCOUNTER — Other Ambulatory Visit: Payer: Self-pay

## 2021-02-21 DIAGNOSIS — M216X2 Other acquired deformities of left foot: Secondary | ICD-10-CM

## 2021-02-21 DIAGNOSIS — L84 Corns and callosities: Secondary | ICD-10-CM | POA: Diagnosis not present

## 2021-02-21 DIAGNOSIS — E119 Type 2 diabetes mellitus without complications: Secondary | ICD-10-CM

## 2021-02-21 DIAGNOSIS — M216X1 Other acquired deformities of right foot: Secondary | ICD-10-CM | POA: Insufficient documentation

## 2021-02-21 NOTE — Progress Notes (Signed)
This patient presents  to my office for at risk foot care.  This patient requires this care by a professional since this patient will be at risk due to having type 2 diabetes.  This patient is unable to cut nails herself since the patient cannot reach her nails.These nails are painful walking and wearing shoes.  This patient presents for at risk foot care today.  General Appearance  Alert, conversant and in no acute stress.  Vascular  Dorsalis pedis and posterior tibial  pulses are palpable  bilaterally.  Capillary return is within normal limits  bilaterally. Temperature is within normal limits  bilaterally.  Neurologic  Senn-Weinstein monofilament wire test within normal limits  bilaterally. Muscle power within normal limits bilaterally.  Nails Normal nails  Bilateral.. No evidence of bacterial infection or drainage bilaterally.  Orthopedic  No limitations of motion  feet .  No crepitus or effusions noted.  No bony pathology .  Hammer toes 2-5  B/L.  Skin  normotropic skin  noted bilaterally.  No signs of infections or ulcers noted.   Callus sub 5th  B/L.  Onychomycosis  Pain in right toes  Pain in left toes  Consent was obtained for treatment procedures.   Debridement of callus with # 15 blade followed by dremel tool usage.  Return office visit  3 months                    Told patient to return for periodic foot care and evaluation due to potential at risk complications.   Gardiner Barefoot DPM

## 2021-03-27 ENCOUNTER — Telehealth: Payer: Self-pay | Admitting: Family Medicine

## 2021-03-27 DIAGNOSIS — I739 Peripheral vascular disease, unspecified: Secondary | ICD-10-CM

## 2021-03-27 NOTE — Telephone Encounter (Signed)
Jimmey Ralph NP is calling in to make provider aware that she seen pt today. Pt had a screening: quantifio bilateral mild to moderate PAD.     Phone: [phone]

## 2021-03-29 DIAGNOSIS — I739 Peripheral vascular disease, unspecified: Secondary | ICD-10-CM | POA: Insufficient documentation

## 2021-04-23 ENCOUNTER — Other Ambulatory Visit: Payer: Self-pay

## 2021-04-23 ENCOUNTER — Encounter: Payer: Self-pay | Admitting: Podiatry

## 2021-04-23 ENCOUNTER — Ambulatory Visit: Payer: Medicare Other | Admitting: Podiatry

## 2021-04-23 DIAGNOSIS — L84 Corns and callosities: Secondary | ICD-10-CM | POA: Diagnosis not present

## 2021-04-23 NOTE — Progress Notes (Signed)
   Subjective: 87 y.o. female presenting to the office today for evaluation of pain and tenderness to the bilateral feet.  Patient comes in for routine foot care however she states that since last visit she has had pain and tenderness especially to the right fourth toe.  She presents for further treatment and evaluation   Past Medical History:  Diagnosis Date   Breast cancer ([place]) 1999   right breast   Diabetes mellitus    GERD (gastroesophageal reflux disease)    GIST (gastrointestinal stromal tumor), malignant ([place]) 2013   History of shingles    Hypercholesteremia    Hypertension    Kidney stones    Malignant neoplasm of other specified sites of stomach 2013   GIST   Osteopenia 12/24/2017   March 2019; next scan March 2021   Personal history of chemotherapy    Personal history of colonic polyps    Thyroid disease      Objective:  Physical Exam General: Alert and oriented x3 in no acute distress  Dermatology: Hyperkeratotic lesion(s) present on the distal tip of the right fourth toe as well as some fifth MTPJ and bilateral great toes. Pain on palpation with a central nucleated core noted. Skin is warm, dry and supple bilateral lower extremities. Negative for open lesions or macerations.  Vascular: Palpable pedal pulses bilaterally. No edema or erythema noted. Capillary refill within normal limits.  Neurological: Epicritic and protective threshold grossly intact bilaterally.   Musculoskeletal Exam: Pain on palpation at the keratotic lesion(s) noted. Range of motion within normal limits bilateral. Muscle strength 5/5 in all groups bilateral.  Assessment: 1.  Preulcerative symptomatic callus lesions bilateral feet   Plan of Care:  1. Patient evaluated 2. Excisional debridement of keratoic lesion(s) using a chisel blade was performed without incident.  3.  Recommend good supportive shoes and not going barefoot 4. Patient is to return to the clinic on neck scheduled  appointment for routine foot care  Edrick Kins, DPM [facility]  Dr. Edrick Kins, DPM    [address], [place] [address]                Office [phone]  Fax [phone]

## 2021-05-27 ENCOUNTER — Ambulatory Visit: Payer: Medicare Other | Admitting: Family Medicine

## 2021-05-27 ENCOUNTER — Other Ambulatory Visit: Payer: Self-pay

## 2021-05-27 ENCOUNTER — Encounter: Payer: Self-pay | Admitting: Family Medicine

## 2021-05-27 VITALS — BP 132/64 | HR 93 | Temp 98.1°F | Resp 16 | Ht 64.0 in | Wt 113.5 lb

## 2021-05-27 DIAGNOSIS — E119 Type 2 diabetes mellitus without complications: Secondary | ICD-10-CM

## 2021-05-27 DIAGNOSIS — E782 Mixed hyperlipidemia: Secondary | ICD-10-CM

## 2021-05-27 DIAGNOSIS — G2581 Restless legs syndrome: Secondary | ICD-10-CM

## 2021-05-27 DIAGNOSIS — C49A0 Gastrointestinal stromal tumor, unspecified site: Secondary | ICD-10-CM

## 2021-05-27 DIAGNOSIS — K219 Gastro-esophageal reflux disease without esophagitis: Secondary | ICD-10-CM | POA: Diagnosis not present

## 2021-05-27 DIAGNOSIS — I10 Essential (primary) hypertension: Secondary | ICD-10-CM

## 2021-05-27 DIAGNOSIS — I739 Peripheral vascular disease, unspecified: Secondary | ICD-10-CM

## 2021-05-27 DIAGNOSIS — R634 Abnormal weight loss: Secondary | ICD-10-CM

## 2021-05-27 DIAGNOSIS — D649 Anemia, unspecified: Secondary | ICD-10-CM

## 2021-05-27 NOTE — Progress Notes (Signed)
Name: Rachael Castro   MRN: [id number]    DOB: 09-Dec-1930   Date:05/27/2021       Progress Note  Chief Complaint  Patient presents with   Hyperlipidemia   Hypertension   Diabetes     Subjective:   [name] is a 87 y.o. female, presents to clinic for routine f/up  Neuropathy/RLS started gabapentin at bedtime 200 mg working to help decrease her sx- mostly occur at night, better with gabapentin, feels like tingling, cramping sometimes in toes to calves, has to get up walk, drink water and sometimes treats with a tsp of mustard  Previously was loosing weight, but stablizied last April - about 4 months ago Unfortunately in the past 4 months she has lost a little more weight - she states it has been due to her husband having a major stroke and she hasn't been eating right, states low weight was 103 and she has gained weight over the past couple months and is drinking ensure Denies night sweats, back pain, CP, SOB hemoptysis, dysphagia   Wt Readings from Last 5 Encounters:  05/27/21 113 lb 8 oz (51.5 kg)  01/25/21 116 lb (52.6 kg)  11/27/20 115 lb 12.8 oz (52.5 kg)  07/12/20 117 lb 9.6 oz (53.3 kg)  03/28/20 119 lb 4.3 oz (54.1 kg)   BMI Readings from Last 5 Encounters:  05/27/21 19.48 kg/m  01/25/21 19.91 kg/m  11/27/20 19.88 kg/m  07/12/20 20.19 kg/m  03/28/20 20.47 kg/m   DM:   Pt managing DM with janumet Reports good med compliance Pt has no SE from meds. Blood sugars checks twice a week - only high once due to eating sweets Denies: Polyuria, polydipsia, vision changes, neuropathy, hypoglycemia Recent pertinent labs: Lab Results  Component Value Date   HGBA1C 6.6 (H) 11/27/2020   HGBA1C 6.2 (A) 07/12/2020   HGBA1C 6.4 (A) 01/10/2020   Standard of care and health maintenance: Foot exam:  done - seeing podiatry - still has a few calluses to feet, no wounds, blisters, sores DM eye exam:  done ACEI/ARB:  losartan Statin:  yes on  statin  Hyperlipidemia: Currently treated with lipitor 80 mg, pt reports good med compliance Last Lipids: Lab Results  Component Value Date   CHOL 176 11/27/2020   HDL 59 11/27/2020   LDLCALC 100 (H) 11/27/2020   TRIG 82 11/27/2020   CHOLHDL 3.0 11/27/2020   - Denies: Chest pain, shortness of breath, myalgias, claudication  Message/paper from home RN visit reporting positive ABI screening at home and dx of PAD - pt denies any pallor, atrophy, wounds/ulcers, no pain with walking longer distances, but occasionally some tingling  Allergies on claritin  GI sx - on omeprazole and pepcid prn   Current Outpatient Medications:    atorvastatin (LIPITOR) 80 MG tablet, TAKE 1 TABLET BY MOUTH EVERYDAY AT BEDTIME, Disp: 90 tablet, Rfl: 3   calcium citrate-vitamin D 200-200 MG-UNIT TABS, Take 1 tablet by mouth daily., Disp: , Rfl:    gabapentin (NEURONTIN) 100 MG capsule, Take 2 capsules (200 mg total) by mouth at bedtime., Disp: 180 capsule, Rfl: 1   loratadine (CLARITIN) 10 MG tablet, Take 1 tablet (10 mg total) by mouth at bedtime., Disp: 30 tablet, Rfl: 11   losartan (COZAAR) 25 MG tablet, Take 1 tablet (25 mg total) by mouth daily., Disp: 90 tablet, Rfl: 3   Magnesium 100 MG CAPS, Take 1 capsule by mouth daily., Disp: , Rfl:    Multiple Vitamin (MULTIVITAMIN) tablet,  Take 1 tablet by mouth daily., Disp: , Rfl:    omeprazole (PRILOSEC) 20 MG capsule, Take 1 capsule (20 mg total) by mouth daily as needed., Disp: 90 capsule, Rfl: 1   sitaGLIPtin-metformin (JANUMET) 50-500 MG tablet, Take 1 tablet by mouth daily., Disp: 90 tablet, Rfl: 3   thiamine (VITAMIN B-1) 100 MG tablet, Take 100 mg by mouth daily., Disp: , Rfl:    famotidine (PEPCID) 20 MG tablet, Take 1 tablet (20 mg total) by mouth 2 (two) times daily for 7 days., Disp: 14 tablet, Rfl: 1  Patient Active Problem List   Diagnosis Date Noted   PAD (peripheral artery disease) ([place]) 03/29/2021   Callus 02/21/2021   Plantar flexed  metatarsal bone of left foot 02/21/2021   Plantar flexed metatarsal bone of right foot 02/21/2021   Clavi 02/21/2021   Callus of foot 01/25/2021   Restless leg syndrome 01/25/2021   Gastroesophageal reflux disease 01/25/2021   Normocytic anemia 10/18/2020   Impingement syndrome of shoulder region 05/16/2019   Osteopenia 12/24/2017   Hypomagnesemia 09/01/2016   Hyperlipidemia 10/23/2015   Type 2 diabetes mellitus ([place]) 10/23/2015   Hypertension 06/18/2015   H/O malignant gastrointestinal stromal tumor (GIST) 05/30/2015   Postherpetic neuralgia 04/17/2015   DDD (degenerative disc disease), lumbar 04/17/2015   DDD (degenerative disc disease), lumbar 04/17/2015   Personal history of malignant neoplasm of breast 07/04/2013   History of colonic polyps     Past Surgical History:  Procedure Laterality Date   ABDOMINAL HYSTERECTOMY     APPENDECTOMY     BREAST BIOPSY Left 09/08/2016   Done by Dr. Jamal Collin   BREAST EXCISIONAL BIOPSY Right 1999   Mastectomy   BREAST SURGERY Right 1999   mastectomy   CATARACT EXTRACTION W/PHACO Left 04/30/2015   Procedure: CATARACT EXTRACTION PHACO AND INTRAOCULAR LENS PLACEMENT ([place]);  Surgeon: Estill Cotta, MD;  Location: [facility] ORS;  Service: Ophthalmology;  Laterality: Left;  [place] 01:39 AP% 23.3 CDE 40.42 fluid pack lot # [id number]   COLONOSCOPY  2008   Sankar   EUS  02/12/2012   Procedure: UPPER ENDOSCOPIC ULTRASOUND (EUS) LINEAR;  Surgeon: Milus Banister, MD;  Location: [facility] END[facility];  Service: Endoscopy;  Laterality: N/A;  radial linear   EYE SURGERY Right 2013   cataract   HERNIA REPAIR  2012   LAPAROTOMY  2013   excision of gastric wall mass    MASTECTOMY     SALPINGOOPHORECTOMY Right 1979   THYROIDECTOMY  1978   TONSILLECTOMY     TUBAL LIGATION     UPPER GI ENDOSCOPY  2013    Family History  Problem Relation Age of Onset   Cancer Mother        cervical   Cancer Father        prostate   Kidney Stones Brother    Glaucoma Brother     Prostatitis Brother    Diabetes Sister    Thyroid disease Brother    Cancer Brother        thyroid   Arthritis Brother    Diabetes Sister    Kidney disease Neg Hx    Bladder Cancer Neg Hx    Breast cancer Neg Hx     Social History   Tobacco Use   Smoking status: Never   Smokeless tobacco: Never  Vaping Use   Vaping Use: Never used  Substance Use Topics   Alcohol use: No   Drug use: No     Allergies  Allergen Reactions  Fish Allergy Rash    Rash and vomiting    Health Maintenance  Topic Date Due   HEMOGLOBIN A1C  05/27/2021   COVID-19 Vaccine (4 - Booster for Pfizer series) 06/12/2021 (Originally 11/15/2020)   INFLUENZA VACCINE  01/17/2022 (Originally 05/20/2021)   MAMMOGRAM  09/10/2021   OPHTHALMOLOGY EXAM  01/21/2022   FOOT EXAM  01/25/2022   DEXA SCAN  01/25/2022   TETANUS/TDAP  04/26/2022   PNA vac Low Risk Adult  Completed   Zoster Vaccines- Shingrix  Completed   HPV VACCINES  Aged Out   COLONOSCOPY (Pts 45-21yr Insurance coverage will need to be confirmed)  Discontinued    Chart Review Today: I personally reviewed active problem list, medication list, allergies, family history, social history, health maintenance, notes from last encounter, lab results, imaging with the patient/caregiver today.   Review of Systems  Constitutional: Negative.   HENT: Negative.    Eyes: Negative.   Respiratory: Negative.    Cardiovascular: Negative.   Gastrointestinal: Negative.   Endocrine: Negative.   Genitourinary: Negative.   Musculoskeletal: Negative.   Skin: Negative.   Allergic/Immunologic: Negative.   Neurological: Negative.   Hematological: Negative.   Psychiatric/Behavioral: Negative.    All other systems reviewed and are negative.   Objective:   Vitals:   05/27/21 1355  BP: 132/64  Pulse: 93  Resp: 16  Temp: 98.1 F (36.7 C)  SpO2: 99%  Weight: 113 lb 8 oz (51.5 kg)  Height: '5\' 4"'$  (1.626 m)    Body mass index is 19.48 kg/m.  Physical  Exam Vitals and nursing note reviewed.  Constitutional:      General: She is not in acute distress.    Appearance: Normal appearance. She is well-developed, well-groomed and normal weight. She is not ill-appearing, toxic-appearing or diaphoretic.     Interventions: Face mask in place.     Comments: Thin elderly female, well appearing  HENT:     Head: Normocephalic and atraumatic.     Right Ear: External ear normal.     Left Ear: External ear normal.  Eyes:     General: Lids are normal. No scleral icterus.       Right eye: No discharge.        Left eye: No discharge.     Conjunctiva/sclera: Conjunctivae normal.  Neck:     Trachea: Phonation normal. No tracheal deviation.  Cardiovascular:     Rate and Rhythm: Normal rate and regular rhythm.     Pulses: Normal pulses.          Radial pulses are 2+ on the right side and 2+ on the left side.     Heart sounds: Normal heart sounds. No murmur heard.   No friction rub. No gallop.  Pulmonary:     Effort: Pulmonary effort is normal. No respiratory distress.     Breath sounds: Normal breath sounds. No stridor. No wheezing, rhonchi or rales.  Chest:     Chest wall: No tenderness.  Abdominal:     General: Bowel sounds are normal. There is no distension.     Palpations: Abdomen is soft.  Musculoskeletal:     Right lower leg: No edema.     Left lower leg: No edema.  Feet:     Right foot:     Skin integrity: Skin integrity normal.     Toenail Condition: Right toenails are normal.     Left foot:     Skin integrity: Callus present. No ulcer, blister, skin breakdown,  erythema, warmth, dry skin or fissure.     Toenail Condition: Left toenails are normal.  Skin:    General: Skin is warm and dry.     Coloration: Skin is not jaundiced or pale.     Findings: No rash.  Neurological:     Mental Status: She is alert.     Motor: No abnormal muscle tone.     Gait: Gait normal.  Psychiatric:        Mood and Affect: Mood normal.        Speech:  Speech normal.        Behavior: Behavior normal. Behavior is cooperative.     Diabetic Foot Exam - Simple   Simple Foot Form Visual Inspection No deformities, no ulcerations, no other skin breakdown bilaterally: Yes Sensation Testing Intact to touch and monofilament testing bilaterally: Yes Pulse Check Comments Difficult to palpate pulses b/l Callus to left dorsal foot and to 1st MTP joint area, no ulcer or blister       Assessment & Plan:     ICD-10-CM   1. Type 2 diabetes mellitus without complication, without long-term current use of insulin (HCC)  E11.9 Hemoglobin A1C    COMPLETE METABOLIC PANEL WITH GFR    Ambulatory referral to Vascular Surgery   well controlled on janumet, statin, losartan, seeing podiatry - still has some callus to left foot, no wound, good sensation    2. Essential hypertension  99991111 COMPLETE METABOLIC PANEL WITH GFR    Ambulatory referral to Vascular Surgery   stable, well controlled, BP at goal today    3. Mixed hyperlipidemia  99991111 COMPLETE METABOLIC PANEL WITH GFR    Ambulatory referral to Vascular Surgery   on statin, no SE or concerns, last lipids at goal    4. Gastroesophageal reflux disease, unspecified whether esophagitis present  K21.9    well controlled with current meds, no dysphagia, change in bowles from normal, appetite stated to be good, denies abd pain    5. PAD (peripheral artery disease) (HCC)  I73.9 Ambulatory referral to Vascular Surgery   from home insurance nurse screening - no concern today for occlusion/ischemia, b/l feet and LE warm to the touch, good cap refill, f/up vascular     6. Restless leg syndrome  G25.81    sx stable well controlled on current meds    7. Unintentional weight loss  R63.4    states it has been because of husbands stroke and not caring for herself or eating right, close monitoring, last labs w/o unremarkable    8. Gastrointestinal stromal tumor (GIST) ([place])  C49.A0    was following with oncology     9. Normocytic anemia  D64.9 CBC w/Diff/Platelet   recheck       Return in about 3 months (around 08/27/2021) for weight HTN DM .   Delsa Grana, PA-C 05/27/21 1:55 PM

## 2021-05-28 LAB — HEMOGLOBIN A1C
Hgb A1c MFr Bld: 6.9 % of total Hgb — ABNORMAL HIGH (ref <5.7)
Mean Plasma Glucose: 151 mg/dL
eAG (mmol/L): 8.4 mmol/L

## 2021-05-28 LAB — COMPLETE METABOLIC PANEL WITH GFR
AG Ratio: 1.6 (calc) (ref 1.0–2.5)
ALT: 12 U/L (ref 6–29)
AST: 17 U/L (ref 10–35)
Albumin: 4.2 g/dL (ref 3.6–5.1)
Alkaline phosphatase (APISO): 69 U/L (ref 37–153)
BUN: 21 mg/dL (ref 7–25)
CO2: 27 mmol/L (ref 20–32)
Calcium: 9.7 mg/dL (ref 8.6–10.4)
Chloride: 105 mmol/L (ref 98–110)
Creat: 0.86 mg/dL (ref 0.60–0.95)
Globulin: 2.6 g/dL (calc) (ref 1.9–3.7)
Glucose, Bld: 105 mg/dL — ABNORMAL HIGH (ref 65–99)
Potassium: 4.6 mmol/L (ref 3.5–5.3)
Sodium: 142 mmol/L (ref 135–146)
Total Bilirubin: 0.4 mg/dL (ref 0.2–1.2)
Total Protein: 6.8 g/dL (ref 6.1–8.1)
eGFR: 65 mL/min/{1.73_m2} (ref >=60)

## 2021-05-28 LAB — CBC WITH DIFFERENTIAL/PLATELET
Absolute Monocytes: 389 cells/uL (ref 200–950)
Basophils Absolute: 32 cells/uL (ref 0–200)
Basophils Relative: 0.4 %
Eosinophils Absolute: 81 cells/uL (ref 15–500)
Eosinophils Relative: 1 %
HCT: 34.9 % — ABNORMAL LOW (ref 35.0–45.0)
Hemoglobin: 11.3 g/dL — ABNORMAL LOW (ref 11.7–15.5)
Lymphs Abs: 1814 cells/uL (ref 850–3900)
MCH: 31.7 pg (ref 27.0–33.0)
MCHC: 32.4 g/dL (ref 32.0–36.0)
MCV: 97.8 fL (ref 80.0–100.0)
MPV: 10.3 fL (ref 7.5–12.5)
Monocytes Relative: 4.8 %
Neutro Abs: 5783 cells/uL (ref 1500–7800)
Neutrophils Relative %: 71.4 %
Platelets: 245 10*3/uL (ref 140–400)
RBC: 3.57 10*6/uL — ABNORMAL LOW (ref 3.80–5.10)
RDW: 12.6 % (ref 11.0–15.0)
Total Lymphocyte: 22.4 %
WBC: 8.1 10*3/uL (ref 3.8–10.8)

## 2021-05-30 ENCOUNTER — Other Ambulatory Visit: Payer: Self-pay

## 2021-05-30 ENCOUNTER — Encounter: Payer: Self-pay | Admitting: Podiatry

## 2021-05-30 ENCOUNTER — Ambulatory Visit: Payer: Medicare Other | Admitting: Podiatry

## 2021-05-30 DIAGNOSIS — L84 Corns and callosities: Secondary | ICD-10-CM

## 2021-05-30 DIAGNOSIS — M216X1 Other acquired deformities of right foot: Secondary | ICD-10-CM

## 2021-05-30 NOTE — Progress Notes (Signed)
This patient presents  to my office for at risk foot care.  This patient requires this care by a professional since this patient will be at risk due to having type 2 diabetes and PAD.  Marland Kitchen  This patient is unable to cut nails callus  since the patient cannot reach her calluses..These calluses are painful walking and wearing shoes.  This patient presents for at risk foot care today.  General Appearance  Alert, conversant and in no acute stress.  Vascular  Dorsalis pedis and posterior tibial  pulses are palpable  bilaterally.  Capillary return is within normal limits  bilaterally. Temperature is within normal limits  bilaterally.  Neurologic  Senn-Weinstein monofilament wire test within normal limits  bilaterally. Muscle power within normal limits bilaterally.  Nails Normal nails  Bilateral.. No evidence of bacterial infection or drainage bilaterally.  Orthopedic  No limitations of motion  feet .  No crepitus or effusions noted.  No bony pathology .  Hammer toes 2-5  B/L.  Skin  normotropic skin  noted bilaterally.  No signs of infections or ulcers noted.   Callus sub 5th  B/L.  Callus sub 2 left foot.  Clavi fourth toes  B/L.  Callus secondary to plantar flexed metatarsals  B/L.  Clavi  B/L  Consent was obtained for treatment procedures.   Debridement of callus with # 15 blade followed by dremel tool usage.Told to purchase spenco insoles for padding.  Return office visit  3 months                    Told patient to return for periodic foot care and evaluation due to potential at risk complications.   Gardiner Barefoot DPM

## 2021-06-01 ENCOUNTER — Other Ambulatory Visit: Payer: Self-pay | Admitting: Family Medicine

## 2021-06-01 DIAGNOSIS — C49A0 Gastrointestinal stromal tumor, unspecified site: Secondary | ICD-10-CM

## 2021-08-26 ENCOUNTER — Other Ambulatory Visit: Payer: Self-pay | Admitting: Family Medicine

## 2021-08-26 DIAGNOSIS — Z1231 Encounter for screening mammogram for malignant neoplasm of breast: Secondary | ICD-10-CM

## 2021-08-27 ENCOUNTER — Ambulatory Visit: Payer: Medicare Other | Admitting: Family Medicine

## 2021-09-05 ENCOUNTER — Other Ambulatory Visit: Payer: Self-pay

## 2021-09-05 ENCOUNTER — Ambulatory Visit: Payer: Medicare Other | Admitting: Podiatry

## 2021-09-05 ENCOUNTER — Encounter: Payer: Self-pay | Admitting: Podiatry

## 2021-09-05 DIAGNOSIS — L84 Corns and callosities: Secondary | ICD-10-CM | POA: Diagnosis not present

## 2021-09-05 DIAGNOSIS — E119 Type 2 diabetes mellitus without complications: Secondary | ICD-10-CM

## 2021-09-05 DIAGNOSIS — M79674 Pain in right toe(s): Secondary | ICD-10-CM | POA: Insufficient documentation

## 2021-09-05 DIAGNOSIS — M79675 Pain in left toe(s): Secondary | ICD-10-CM

## 2021-09-05 DIAGNOSIS — M216X1 Other acquired deformities of right foot: Secondary | ICD-10-CM

## 2021-09-05 DIAGNOSIS — B351 Tinea unguium: Secondary | ICD-10-CM

## 2021-09-05 NOTE — Progress Notes (Signed)
This patient presents  to my office for at risk foot care.  This patient requires this care by a professional since this patient will be at risk due to having type 2 diabetes and PAD.  Rachael Castro  This patient is unable to cut nails callus  since the patient cannot reach her calluses..These calluses are painful walking and wearing shoes.  She is also having ingrown toenails both big toes. This patient presents for at risk foot care today.  General Appearance  Alert, conversant and in no acute stress.  Vascular  Dorsalis pedis and posterior tibial  pulses are palpable  bilaterally.  Capillary return is within normal limits  bilaterally. Temperature is within normal limits  bilaterally.  Neurologic  Senn-Weinstein monofilament wire test within normal limits  bilaterally. Muscle power within normal limits bilaterally.  Nails Normal nails  Bilateral.. No evidence of bacterial infection or drainage bilaterally. Thick ingrown toenails both borders hallux  B/l.  Orthopedic  No limitations of motion  feet .  No crepitus or effusions noted.  No bony pathology .  Hammer toes 2-5  B/L.  Skin  normotropic skin  noted bilaterally.  No signs of infections or ulcers noted.   Callus sub 5th  B/L.  Callus sub 2 left foot.  Clavi fourth toes  B/L.  Callus secondary to plantar flexed metatarsals  B/L.  Clavi  B/L  Consent was obtained for treatment procedures.   Debridement of callus with # 15 blade followed by dremel tool usage. Debride hallux toenails medial and lateral borders  B/L.  Return office visit  3 months                    Told patient to return for periodic foot care and evaluation due to potential at risk complications.   Gardiner Barefoot DPM

## 2021-09-06 ENCOUNTER — Encounter: Payer: Self-pay | Admitting: Nurse Practitioner

## 2021-09-06 ENCOUNTER — Ambulatory Visit: Payer: Medicare Other | Admitting: Family Medicine

## 2021-09-06 ENCOUNTER — Ambulatory Visit: Payer: Medicare Other | Admitting: Nurse Practitioner

## 2021-09-06 VITALS — BP 132/74 | HR 79 | Temp 98.1°F | Resp 16 | Ht 64.0 in | Wt 108.7 lb

## 2021-09-06 DIAGNOSIS — E782 Mixed hyperlipidemia: Secondary | ICD-10-CM

## 2021-09-06 DIAGNOSIS — I739 Peripheral vascular disease, unspecified: Secondary | ICD-10-CM

## 2021-09-06 DIAGNOSIS — I10 Essential (primary) hypertension: Secondary | ICD-10-CM | POA: Diagnosis not present

## 2021-09-06 DIAGNOSIS — C49A0 Gastrointestinal stromal tumor, unspecified site: Secondary | ICD-10-CM

## 2021-09-06 DIAGNOSIS — T7840XD Allergy, unspecified, subsequent encounter: Secondary | ICD-10-CM

## 2021-09-06 DIAGNOSIS — E119 Type 2 diabetes mellitus without complications: Secondary | ICD-10-CM

## 2021-09-06 DIAGNOSIS — K219 Gastro-esophageal reflux disease without esophagitis: Secondary | ICD-10-CM

## 2021-09-06 DIAGNOSIS — R634 Abnormal weight loss: Secondary | ICD-10-CM

## 2021-09-06 MED ORDER — LORATADINE 10 MG PO TABS: 10.0000 mg | ORAL_TABLET | Freq: Every day | ORAL | 11 refills | Status: DC

## 2021-09-06 MED ORDER — ATORVASTATIN CALCIUM 80 MG PO TABS: ORAL_TABLET | ORAL | 3 refills | Status: DC

## 2021-09-06 MED ORDER — FAMOTIDINE 20 MG PO TABS: 20.0000 mg | ORAL_TABLET | Freq: Two times a day (BID) | ORAL | 1 refills | Status: DC

## 2021-09-06 MED ORDER — OMEPRAZOLE 20 MG PO CPDR: 20.0000 mg | DELAYED_RELEASE_CAPSULE | Freq: Every day | ORAL | 1 refills | Status: DC | PRN

## 2021-09-06 MED ORDER — JANUMET 50-500 MG PO TABS: 1.0000 | ORAL_TABLET | Freq: Every day | ORAL | 3 refills | Status: DC

## 2021-09-06 MED ORDER — LOSARTAN POTASSIUM 25 MG PO TABS: 25.0000 mg | ORAL_TABLET | Freq: Every day | ORAL | 3 refills | Status: DC

## 2021-09-06 NOTE — Progress Notes (Signed)
BP 132/74   Pulse 79   Temp 98.1 F (36.7 C) (Oral)   Resp 16   Ht 5\' 4"  (1.626 m)   Wt 108 lb 11.2 oz (49.3 kg)   SpO2 95%   BMI 18.66 kg/m    Subjective:    Patient ID: Rachael Castro, female    DOB: 06/14/1931, 87 y.o.   MRN: [id number]  HPI: Rachael Castro is a 87 y.o. female, here alone  Chief Complaint  Patient presents with   Hypertension   Diabetes   HTN: Her blood pressure today is 132/74. She does not check her blood pressure at home.  She denies any headache, chest pain, shortness of breath, dizziness or blurred vision.  She is currently taking losartan 25 mg daily.    DM: She says she checks her blood sugar three times a week.  She says it runs around 120s.  She denies any polyphagia, polydipsia or polyuria.  She is currently taking Janumet 50-500 mg daily.  Her last A1c was 6.9 on 05/27/21.  Will get labs today.   PAD:  Message/paper from home RN visit reporting positive ABI screening at home and dx of PAD - pt denies any pallor, atrophy, wounds/ulcers, no pain with walking longer distances, but occasionally some tingling.  This information was obtained from previous visit with Delsa Grana PA on 05/27/21  .  Patient reported that she was supposed to see a vascular doctor regarding these findings.  Referral placed.   Hyperlipidemia: She says she has been taking atorvastatin 80 mg daily.  Her last LDL was 100 on 11/27/20.  Will get labs.   GERD/Gastrointestinal tumor: She says her acid reflux has been good.  She has noticed that it is worse when she drinks milk.  She is currently taking omeprazole 20 mg daily and famotidine 20 mg daily.  She last saw Dr. Bonna Gains (GI) on 09/21/2018.She denies any abdominal pain or changes in bowel habits.   Weight loss: She continues to lose weight.  Last year she weighed 117 lbs today she weighs 108 lbs. She drinks a boost every once in a while.  She is caring for her husband who had a stroke last year.  She says she does not eat like  she used to. She says that she has her hands full with him.  Discussed eating a high calorie diet.  Also, discussed that it is important that she care for herself so that she can care for her husband. She said she will try. Discussed following up with Delsa Grana PA.  Allergies: She says she uses Claritin 10 mg daily when she needs  it for her allergies.    Relevant past medical, surgical, family and social history reviewed and updated as indicated. Interim medical history since our last visit reviewed. Allergies and medications reviewed and updated.  Review of Systems  Constitutional: Negative for fever, positive for weight change.  Respiratory: Negative for cough and shortness of breath.   Cardiovascular: Negative for chest pain or palpitations.  Gastrointestinal: Negative for abdominal pain, no bowel changes.  Musculoskeletal: Negative for gait problem or joint swelling.  Skin: Negative for rash.  Neurological: Negative for dizziness or headache.  No other specific complaints in a complete review of systems (except as listed in HPI above).      Objective:    BP 132/74   Pulse 79   Temp 98.1 F (36.7 C) (Oral)   Resp 16   Ht 5\' 4"  (1.626  m)   Wt 108 lb 11.2 oz (49.3 kg)   SpO2 95%   BMI 18.66 kg/m   Wt Readings from Last 3 Encounters:  09/06/21 108 lb 11.2 oz (49.3 kg)  05/27/21 113 lb 8 oz (51.5 kg)  01/25/21 116 lb (52.6 kg)    Physical Exam  Constitutional: Patient appears well-developed and well-nourished. No distress.  HEENT: head atraumatic, normocephalic, pupils equal and reactive to light,  neck supple Cardiovascular: Normal rate, regular rhythm and normal heart sounds.  No murmur heard. No BLE edema. Pulmonary/Chest: Effort normal and breath sounds normal. No respiratory distress. Abdominal: Soft.  There is no tenderness. Psychiatric: Patient has a normal mood and affect. behavior is normal. Judgment and thought content normal.   Results for orders placed or  performed in visit on 05/27/21  Hemoglobin A1C  Result Value Ref Range   Hgb A1c MFr Bld 6.9 (H) <5.7 % of total Hgb   Mean Plasma Glucose 151 mg/dL   eAG (mmol/L) 8.4 mmol/L  COMPLETE METABOLIC PANEL WITH GFR  Result Value Ref Range   Glucose, Bld 105 (H) 65 - 99 mg/dL   BUN 21 7 - 25 mg/dL   Creat 0.86 0.60 - 0.95 mg/dL   eGFR 65 > OR = 60 mL/min/1.18m2   BUN/Creatinine Ratio NOT APPLICABLE 6 - 22 (calc)   Sodium 142 135 - 146 mmol/L   Potassium 4.6 3.5 - 5.3 mmol/L   Chloride 105 98 - 110 mmol/L   CO2 27 20 - 32 mmol/L   Calcium 9.7 8.6 - 10.4 mg/dL   Total Protein 6.8 6.1 - 8.1 g/dL   Albumin 4.2 3.6 - 5.1 g/dL   Globulin 2.6 1.9 - 3.7 g/dL (calc)   AG Ratio 1.6 1.0 - 2.5 (calc)   Total Bilirubin 0.4 0.2 - 1.2 mg/dL   Alkaline phosphatase (APISO) 69 37 - 153 U/L   AST 17 10 - 35 U/L   ALT 12 6 - 29 U/L  CBC w/Diff/Platelet  Result Value Ref Range   WBC 8.1 3.8 - 10.8 Thousand/uL   RBC 3.57 (L) 3.80 - 5.10 Million/uL   Hemoglobin 11.3 (L) 11.7 - 15.5 g/dL   HCT 34.9 (L) 35.0 - 45.0 %   MCV 97.8 80.0 - 100.0 fL   MCH 31.7 27.0 - 33.0 pg   MCHC 32.4 32.0 - 36.0 g/dL   RDW 12.6 11.0 - 15.0 %   Platelets 245 140 - 400 Thousand/uL   MPV 10.3 7.5 - 12.5 fL   Neutro Abs 5,783 1,500 - 7,800 cells/uL   Lymphs Abs 1,814 850 - 3,900 cells/uL   Absolute Monocytes 389 200 - 950 cells/uL   Eosinophils Absolute 81 15 - 500 cells/uL   Basophils Absolute 32 0 - 200 cells/uL   Neutrophils Relative % 71.4 %   Total Lymphocyte 22.4 %   Monocytes Relative 4.8 %   Eosinophils Relative 1.0 %   Basophils Relative 0.4 %      Assessment & Plan:   1. Essential hypertension  - losartan (COZAAR) 25 MG tablet; Take 1 tablet (25 mg total) by mouth daily.  Dispense: 90 tablet; Refill: 3 - CBC with Differential/Platelet - COMPLETE METABOLIC PANEL WITH GFR  2. Type 2 diabetes mellitus without complication, without long-term current use of insulin (HCC)  - losartan (COZAAR) 25 MG tablet;  Take 1 tablet (25 mg total) by mouth daily.  Dispense: 90 tablet; Refill: 3 - sitaGLIPtin-metformin (JANUMET) 50-500 MG tablet; Take 1 tablet by mouth  daily.  Dispense: 90 tablet; Refill: 3 - Hemoglobin A1c  3. PAD (peripheral artery disease) ([place])  - Ambulatory referral to Vascular Surgery  4. Gastroesophageal reflux disease, unspecified whether esophagitis present  omeprazole (PRILOSEC) 20 MG capsule; Take 1 capsule (20 mg total) by mouth daily as needed.  Dispense: 90 capsule; Refill: 1  5. Gastrointestinal stromal tumor (GIST) (HCC) - famotidine (PEPCID) 20 MG tablet; Take 1 tablet (20 mg total) by mouth 2 (two) times daily for 7 days.  Dispense: 14 tablet; Refill: 1 - omeprazole (PRILOSEC) 20 MG capsule; Take 1 capsule (20 mg total) by mouth daily as needed.  Dispense: 90 capsule; Refill: 1  6. Unintentional weight loss -high caloric diet -eat more frequently  7. Mixed hyperlipidemia  - atorvastatin (LIPITOR) 80 MG tablet; TAKE 1 TABLET BY MOUTH EVERYDAY AT BEDTIME  Dispense: 90 tablet; Refill: 3 - Lipid panel  8. Allergies - loratadine (CLARITIN) 10 MG tablet; Take 1 tablet (10 mg total) by mouth at bedtime.  Dispense: 30 tablet; Refill: 11       Follow up plan: Return in about 3 months (around 12/07/2021) for follow up.

## 2021-09-07 LAB — CBC WITH DIFFERENTIAL/PLATELET
Absolute Monocytes: 389 cells/uL (ref 200–950)
Basophils Absolute: 29 cells/uL (ref 0–200)
Basophils Relative: 0.4 %
Eosinophils Absolute: 43 cells/uL (ref 15–500)
Eosinophils Relative: 0.6 %
HCT: 35.4 % (ref 35.0–45.0)
Hemoglobin: 11.6 g/dL — ABNORMAL LOW (ref 11.7–15.5)
Lymphs Abs: 1822 cells/uL (ref 850–3900)
MCH: 32 pg (ref 27.0–33.0)
MCHC: 32.8 g/dL (ref 32.0–36.0)
MCV: 97.8 fL (ref 80.0–100.0)
MPV: 10.8 fL (ref 7.5–12.5)
Monocytes Relative: 5.4 %
Neutro Abs: 4918 cells/uL (ref 1500–7800)
Neutrophils Relative %: 68.3 %
Platelets: 274 10*3/uL (ref 140–400)
RBC: 3.62 10*6/uL — ABNORMAL LOW (ref 3.80–5.10)
RDW: 12.6 % (ref 11.0–15.0)
Total Lymphocyte: 25.3 %
WBC: 7.2 10*3/uL (ref 3.8–10.8)

## 2021-09-07 LAB — COMPLETE METABOLIC PANEL WITH GFR
AG Ratio: 1.6 (calc) (ref 1.0–2.5)
ALT: 13 U/L (ref 6–29)
AST: 16 U/L (ref 10–35)
Albumin: 4.2 g/dL (ref 3.6–5.1)
Alkaline phosphatase (APISO): 71 U/L (ref 37–153)
BUN: 16 mg/dL (ref 7–25)
CO2: 24 mmol/L (ref 20–32)
Calcium: 9.7 mg/dL (ref 8.6–10.4)
Chloride: 106 mmol/L (ref 98–110)
Creat: 0.76 mg/dL (ref 0.60–0.95)
Globulin: 2.7 g/dL (calc) (ref 1.9–3.7)
Glucose, Bld: 93 mg/dL (ref 65–99)
Potassium: 4.2 mmol/L (ref 3.5–5.3)
Sodium: 143 mmol/L (ref 135–146)
Total Bilirubin: 0.3 mg/dL (ref 0.2–1.2)
Total Protein: 6.9 g/dL (ref 6.1–8.1)
eGFR: 75 mL/min/{1.73_m2} (ref >=60)

## 2021-09-07 LAB — LIPID PANEL
Cholesterol: 200 mg/dL — ABNORMAL HIGH (ref <200)
HDL: 62 mg/dL (ref >=50)
LDL Cholesterol (Calc): 112 mg/dL (calc) — ABNORMAL HIGH
Non-HDL Cholesterol (Calc): 138 mg/dL (calc) — ABNORMAL HIGH (ref <130)
Total CHOL/HDL Ratio: 3.2 (calc) (ref <5.0)
Triglycerides: 146 mg/dL (ref <150)

## 2021-09-07 LAB — HEMOGLOBIN A1C
Hgb A1c MFr Bld: 6.7 % of total Hgb — ABNORMAL HIGH (ref <5.7)
Mean Plasma Glucose: 146 mg/dL
eAG (mmol/L): 8.1 mmol/L

## 2021-09-18 ENCOUNTER — Other Ambulatory Visit: Payer: Self-pay

## 2021-09-18 ENCOUNTER — Ambulatory Visit
Admission: RE | Admit: 2021-09-18 | Discharge: 2021-09-18 | Disposition: A | Discharge status: Home / Self Care | Payer: Medicare Other | Source: Ambulatory Visit | Attending: Family Medicine | Admitting: Family Medicine

## 2021-09-18 DIAGNOSIS — Z1231 Encounter for screening mammogram for malignant neoplasm of breast: Secondary | ICD-10-CM | POA: Insufficient documentation

## 2021-10-22 ENCOUNTER — Ambulatory Visit (INDEPENDENT_AMBULATORY_CARE_PROVIDER_SITE_OTHER): Payer: Medicare Other | Admitting: Vascular Surgery

## 2021-10-22 ENCOUNTER — Encounter (INDEPENDENT_AMBULATORY_CARE_PROVIDER_SITE_OTHER): Payer: Self-pay | Admitting: Vascular Surgery

## 2021-10-22 VITALS — BP 165/74 | HR 76 | Resp 17 | Ht 64.0 in | Wt 112.0 lb

## 2021-10-22 DIAGNOSIS — E119 Type 2 diabetes mellitus without complications: Secondary | ICD-10-CM | POA: Diagnosis not present

## 2021-10-22 DIAGNOSIS — E782 Mixed hyperlipidemia: Secondary | ICD-10-CM

## 2021-10-22 DIAGNOSIS — I10 Essential (primary) hypertension: Secondary | ICD-10-CM | POA: Diagnosis not present

## 2021-10-22 DIAGNOSIS — I739 Peripheral vascular disease, unspecified: Secondary | ICD-10-CM

## 2021-10-22 NOTE — Progress Notes (Signed)
Patient ID: Rachael Castro, female   DOB: Feb 16, 1931, 89 y.o.   MRN: [id number]  Chief Complaint  Patient presents with   New Patient (Initial Visit)    HPI Rachael Castro is a 88 y.o. female.  I am asked to see the patient by L. Tapia for evaluation of PAD.  The patient had a home health screening which suggested significant PAD and her lower extremities.  Given her age and comorbidities, she was an appropriate candidate for PAD screening.  She denies any disabling claudication symptoms, true rest pain, or nonhealing ulceration.  The patient does describe burning and tingling in the toes particularly on the right foot.  This has been going on for months to years with slow progression.  She says she can walk pretty well on level ground.  She says she remains active.   Past Medical History:  Diagnosis Date   Breast cancer ([place]) 1999   right breast   Diabetes mellitus    GERD (gastroesophageal reflux disease)    GIST (gastrointestinal stromal tumor), malignant ([place]) 2013   History of shingles    Hypercholesteremia    Hypertension    Kidney stones    Malignant neoplasm of other specified sites of stomach 2013   GIST   Osteopenia 12/24/2017   March 2019; next scan March 2021   Personal history of chemotherapy    Personal history of colonic polyps    Thyroid disease     Past Surgical History:  Procedure Laterality Date   ABDOMINAL HYSTERECTOMY     APPENDECTOMY     BREAST BIOPSY Left 09/08/2016   Done by Dr. Jamal Collin   BREAST EXCISIONAL BIOPSY Right 1999   Mastectomy   BREAST SURGERY Right 1999   mastectomy   CATARACT EXTRACTION W/PHACO Left 04/30/2015   Procedure: CATARACT EXTRACTION PHACO AND INTRAOCULAR LENS PLACEMENT ([place]);  Surgeon: Estill Cotta, MD;  Location: [facility] ORS;  Service: Ophthalmology;  Laterality: Left;  [place] 01:39 AP% 23.3 CDE 40.42 fluid pack lot # [id number]   COLONOSCOPY  2008   Sankar   EUS  02/12/2012   Procedure: UPPER ENDOSCOPIC ULTRASOUND (EUS)  LINEAR;  Surgeon: Milus Banister, MD;  Location: [facility];  Service: Endoscopy;  Laterality: N/A;  radial linear   EYE SURGERY Right 2013   cataract   HERNIA REPAIR  2012   LAPAROTOMY  2013   excision of gastric wall mass    MASTECTOMY     SALPINGOOPHORECTOMY Right 1979   THYROIDECTOMY  1978   TONSILLECTOMY     TUBAL LIGATION     UPPER GI ENDOSCOPY  2013     Family History  Problem Relation Age of Onset   Cancer Mother        cervical   Cancer Father        prostate   Kidney Stones Brother    Glaucoma Brother    Prostatitis Brother    Diabetes Sister    Thyroid disease Brother    Cancer Brother        thyroid   Arthritis Brother    Diabetes Sister    Kidney disease Neg Hx    Bladder Cancer Neg Hx    Breast cancer Neg Hx       Social History   Tobacco Use   Smoking status: Never   Smokeless tobacco: Never  Vaping Use   Vaping Use: Never used  Substance Use Topics   Alcohol use: No   Drug use: No  Allergies  Allergen Reactions   Fish Allergy Rash    Rash and vomiting    Current Outpatient Medications  Medication Sig Dispense Refill   atorvastatin (LIPITOR) 80 MG tablet TAKE 1 TABLET BY MOUTH EVERYDAY AT BEDTIME 90 tablet 3   calcium citrate-vitamin D 200-200 MG-UNIT TABS Take 1 tablet by mouth daily.     loratadine (CLARITIN) 10 MG tablet Take 1 tablet (10 mg total) by mouth at bedtime. 30 tablet 11   losartan (COZAAR) 25 MG tablet Take 1 tablet (25 mg total) by mouth daily. 90 tablet 3   Magnesium 100 MG CAPS Take 1 capsule by mouth daily.     Multiple Vitamin (MULTIVITAMIN) tablet Take 1 tablet by mouth daily.     omeprazole (PRILOSEC) 20 MG capsule Take 1 capsule (20 mg total) by mouth daily as needed. 90 capsule 1   sitaGLIPtin-metformin (JANUMET) 50-500 MG tablet Take 1 tablet by mouth daily. 90 tablet 3   thiamine (VITAMIN B-1) 100 MG tablet Take 100 mg by mouth daily.     famotidine (PEPCID) 20 MG tablet Take 1 tablet (20 mg total) by  mouth 2 (two) times daily for 7 days. 14 tablet 1   gabapentin (NEURONTIN) 100 MG capsule Take 2 capsules (200 mg total) by mouth at bedtime. (Patient not taking: Reported on 10/22/2021) 180 capsule 1   No current facility-administered medications for this visit.      REVIEW OF SYSTEMS (Negative unless checked)  Constitutional: _0 Weight loss  _1 Fever  _2 Chills Cardiac: _3 Chest pain   _4 Chest pressure   _5 Palpitations   _6 Shortness of breath when laying flat   _7 Shortness of breath at rest   _8 Shortness of breath with exertion. Vascular:  _9 Pain in legs with walking   _10 Pain in legs at rest   _11 Pain in legs when laying flat   _12 Claudication   _13 Pain in feet when walking  _14 Pain in feet at rest  _15 Pain in feet when laying flat   _16 History of DVT   _17 Phlebitis   _18 Swelling in legs   _19 Varicose veins   _20 Non-healing ulcers Pulmonary:   _21 Uses home oxygen   _22 Productive cough   _23 Hemoptysis   _24 Wheeze  _25 COPD   _26 Asthma Neurologic:  _27 Dizziness  _28 Blackouts   _29 Seizures   _30 History of stroke   _31 History of TIA  _32 Aphasia   _33 Temporary blindness   _34 Dysphagia   _35 Weakness or numbness in arms   _36 Weakness or numbness in legs Musculoskeletal:  _37 Arthritis   _38 Joint swelling   _39 Joint pain   _40 Low back pain Hematologic:  _41 Easy bruising  _42 Easy bleeding   _43 Hypercoagulable state   _44 Anemic  _45 Hepatitis Gastrointestinal:  _46 Blood in stool   _47 Vomiting blood  _48 Gastroesophageal reflux/heartburn   _49 Abdominal pain Genitourinary:  _50 Chronic kidney disease   _51 Difficult urination  _52 Frequent urination  _53 Burning with urination   _54 Hematuria Skin:  _55 Rashes   _56 Ulcers   _57 Wounds Psychological:  _58 History of anxiety   _59  History of major depression.    Physical Exam BP (!) 165/74 (BP Location: Left Arm)    Pulse 76    Resp 17    Ht _60  (1.626 m)    Wt 112 lb (50.8 kg)    BMI 19.22 kg/m  Gen:  WD/WN, NAD Head: [place]/AT, No temporalis wasting.  Ear/Nose/Throat: Hearing grossly intact, nares w/o erythema  or drainage, oropharynx w/o Erythema/Exudate Eyes: Conjunctiva clear, sclera non-icteric  Neck: trachea midline.  No JVD.  Pulmonary:  Good air movement, respirations not labored, no use of  accessory muscles  Cardiac: RRR, no JVD Vascular:  Vessel Right Left  Radial Palpable Palpable                          DP 1+ 2+  PT 1+ 1+   Gastrointestinal:. No masses, surgical incisions, or scars. Musculoskeletal: M/S 5/5 throughout.  Extremities without ischemic changes.  No deformity or atrophy. Trace LE edema. Neurologic: Sensation grossly intact in extremities.  Symmetrical.  Speech is fluent. Motor exam as listed above. Psychiatric: Judgment intact, Mood & affect appropriate for pt's clinical situation. Dermatologic: No rashes or ulcers noted.  No cellulitis or open wounds.    Radiology No results found.  Labs Recent Results (from the past 2160 hour(s))  Lipid panel     Status: Abnormal   Collection Time: 09/06/21  1:40 PM  Result Value Ref Range   Cholesterol 200 (H) <200 mg/dL   HDL 62 > OR = 50 mg/dL   Triglycerides 146 <150 mg/dL   LDL Cholesterol (Calc) 112 (H) mg/dL (calc)    Comment: Reference range: <100 . Desirable range <100 mg/dL for primary prevention;   <70 mg/dL for patients with CHD or diabetic patients  with > or = 2 CHD risk factors. Marland Kitchen LDL-C is now calculated using the Martin-Hopkins  calculation, which is a validated novel method providing  better accuracy than the Friedewald equation in the  estimation of LDL-C.  Cresenciano Genre et al. Annamaria Helling. 0165;537(48): 2061-2068  (http://education.[employer].com/faq/FAQ164)    Total CHOL/HDL Ratio 3.2 <5.0 (calc)   Non-HDL Cholesterol (Calc) 138 (H) <130 mg/dL (calc)    Comment: For patients with diabetes plus 1 major ASCVD risk  factor, treating to a non-HDL-C goal of <100 mg/dL  (LDL-C of <70 mg/dL) is considered a therapeutic  option.   CBC with Differential/Platelet     Status: Abnormal   Collection Time:  09/06/21  1:40 PM  Result Value Ref Range   WBC 7.2 3.8 - 10.8 Thousand/uL   RBC 3.62 (L) 3.80 - 5.10 Million/uL   Hemoglobin 11.6 (L) 11.7 - 15.5 g/dL   HCT 35.4 35.0 - 45.0 %   MCV 97.8 80.0 - 100.0 fL   MCH 32.0 27.0 - 33.0 pg   MCHC 32.8 32.0 - 36.0 g/dL   RDW 12.6 11.0 - 15.0 %   Platelets 274 140 - 400 Thousand/uL   MPV 10.8 7.5 - 12.5 fL   Neutro Abs 4,918 1,500 - 7,800 cells/uL   Lymphs Abs 1,822 850 - 3,900 cells/uL   Absolute Monocytes 389 200 - 950 cells/uL   Eosinophils Absolute 43 15 - 500 cells/uL   Basophils Absolute 29 0 - 200 cells/uL   Neutrophils Relative % 68.3 %   Total Lymphocyte 25.3 %   Monocytes Relative 5.4 %   Eosinophils Relative 0.6 %   Basophils Relative 0.4 %  COMPLETE METABOLIC PANEL WITH GFR     Status: None   Collection Time: 09/06/21  1:40 PM  Result Value Ref Range   Glucose, Bld 93 65 - 99 mg/dL    Comment: .            Fasting reference interval .    BUN 16 7 - 25 mg/dL   Creat 0.76 0.60 - 0.95 mg/dL   eGFR 75 > OR = 60 mL/min/1.73m    Comment: The eGFR is based on the CKD-EPI 2021 equation. To calculate  the new eGFR from a previous Creatinine or Cystatin C  result, go to https://www.kidney.org/professionals/ kdoqi/gfr%5Fcalculator    BUN/Creatinine Ratio NOT APPLICABLE 6 - 22 (calc)   Sodium 143 135 - 146 mmol/L   Potassium 4.2 3.5 - 5.3 mmol/L   Chloride 106 98 - 110 mmol/L   CO2 24 20 - 32 mmol/L   Calcium 9.7 8.6 - 10.4 mg/dL   Total Protein 6.9 6.1 - 8.1 g/dL   Albumin 4.2 3.6 - 5.1 g/dL   Globulin 2.7 1.9 - 3.7 g/dL (calc)   AG Ratio 1.6 1.0 - 2.5 (calc)   Total Bilirubin 0.3 0.2 - 1.2 mg/dL   Alkaline phosphatase (APISO) 71 37 - 153 U/L   AST 16 10 - 35 U/L   ALT 13 6 - 29 U/L  Hemoglobin A1c     Status: Abnormal   Collection Time: 09/06/21  1:40 PM  Result Value Ref Range   Hgb A1c MFr Bld 6.7 (H) <5.7 % of total Hgb    Comment: For someone without known diabetes, a hemoglobin A1c value of 6.5% or greater  indicates that they may have  diabetes and this should be confirmed with a follow-up  test. . For someone with known diabetes, a value <7% indicates  that their diabetes is well controlled and a value  greater than or equal to 7% indicates suboptimal  control. A1c targets should be individualized based on  duration of diabetes, age, comorbid conditions, and  other considerations. . Currently, no consensus exists regarding use of hemoglobin A1c for diagnosis of diabetes for children. .    Mean Plasma Glucose 146 mg/dL   eAG (mmol/L) 8.1 mmol/L    Assessment/Plan:  Hypertension blood pressure control important in reducing the progression of atherosclerotic disease. On appropriate oral medications.   Type 2 diabetes mellitus (HCC) blood glucose control important in reducing the progression of atherosclerotic disease. Also, involved in wound healing. On appropriate medications.   Hyperlipidemia lipid control important in reducing the progression of atherosclerotic disease. Continue statin therapy   PAD (peripheral artery disease) ([place]) The patient has a home health screening study suggesting significant PAD.  Given her age and her multiple atherosclerotic risk factors, this is not surprising.  Her symptoms at this time are clearly not limb threatening.  She has minimal claudication.  The burning in her toes could be related to circulation but is likely more due to neuropathy.  We will get her in in the near future with arterial duplex and ABIs for further evaluation of her degree of peripheral arterial disease.  We discussed the pathophysiology and natural history of peripheral arterial disease.  The patient voices her understanding.      Leotis Pain 10/22/2021, 10:01 AM   This note was created with Dragon medical transcription system.  Any errors from dictation are unintentional.

## 2021-10-22 NOTE — Assessment & Plan Note (Signed)
blood glucose control important in reducing the progression of atherosclerotic disease. Also, involved in wound healing. On appropriate medications.  

## 2021-10-22 NOTE — Assessment & Plan Note (Signed)
lipid control important in reducing the progression of atherosclerotic disease. Continue statin therapy  

## 2021-10-22 NOTE — Assessment & Plan Note (Signed)
The patient has a home health screening study suggesting significant PAD.  Given her age and her multiple atherosclerotic risk factors, this is not surprising.  Her symptoms at this time are clearly not limb threatening.  She has minimal claudication.  The burning in her toes could be related to circulation but is likely more due to neuropathy.  We will get her in in the near future with arterial duplex and ABIs for further evaluation of her degree of peripheral arterial disease.  We discussed the pathophysiology and natural history of peripheral arterial disease.  The patient voices her understanding.

## 2021-10-22 NOTE — Assessment & Plan Note (Signed)
blood pressure control important in reducing the progression of atherosclerotic disease. On appropriate oral medications.  

## 2021-10-30 ENCOUNTER — Ambulatory Visit (INDEPENDENT_AMBULATORY_CARE_PROVIDER_SITE_OTHER): Payer: Medicare Other

## 2021-10-30 ENCOUNTER — Other Ambulatory Visit: Payer: Self-pay

## 2021-10-30 DIAGNOSIS — I739 Peripheral vascular disease, unspecified: Secondary | ICD-10-CM | POA: Diagnosis not present

## 2021-11-05 ENCOUNTER — Other Ambulatory Visit: Payer: Self-pay

## 2021-11-05 ENCOUNTER — Ambulatory Visit (INDEPENDENT_AMBULATORY_CARE_PROVIDER_SITE_OTHER): Payer: Medicare Other

## 2021-11-05 DIAGNOSIS — Z0000 Encounter for general adult medical examination without abnormal findings: Secondary | ICD-10-CM | POA: Diagnosis not present

## 2021-11-05 MED ORDER — GABAPENTIN 100 MG PO CAPS: 200.0000 mg | ORAL_CAPSULE | Freq: Every day | ORAL | 0 refills | Status: DC

## 2021-11-05 NOTE — Patient Instructions (Signed)
Rachael Castro , Thank you for taking time to come for your Medicare Wellness Visit. I appreciate your ongoing commitment to your health goals. Please review the following plan we discussed and let me know if I can assist you in the future.   Screening recommendations/referrals: Colonoscopy: no longer required Mammogram: done 09/18/21 Bone Density: done 01/26/20 Recommended yearly ophthalmology/optometry visit for glaucoma screening and checkup Recommended yearly dental visit for hygiene and checkup  Vaccinations: Influenza vaccine: completed per patient at last visit on 09/06/21 Pneumococcal vaccine: due Tdap vaccine: due Shingles vaccine: done 01/05/19 & 02/22/20   Covid-19:done 11/14/19, 12/05/19, 07/16/20, 03/04/21 & 08/02/21  Advanced directives: Please bring a copy of your health care power of attorney and living will to the office at your convenience.   Conditions/risks identified: Recommend increasing physical activity   Next appointment: Follow up in one year for your annual wellness visit    Preventive Care 65 Years and Older, Female Preventive care refers to lifestyle choices and visits with your health care provider that can promote health and wellness. What does preventive care include? A yearly physical exam. This is also called an annual well check. Dental exams once or twice a year. Routine eye exams. Ask your health care provider how often you should have your eyes checked. Personal lifestyle choices, including: Daily care of your teeth and gums. Regular physical activity. Eating a healthy diet. Avoiding tobacco and drug use. Limiting alcohol use. Practicing safe sex. Taking low-dose aspirin every day. Taking vitamin and mineral supplements as recommended by your health care provider. What happens during an annual well check? The services and screenings done by your health care provider during your annual well check will depend on your age, overall health, lifestyle risk  factors, and family history of disease. Counseling  Your health care provider may ask you questions about your: Alcohol use. Tobacco use. Drug use. Emotional well-being. Home and relationship well-being. Sexual activity. Eating habits. History of falls. Memory and ability to understand (cognition). Work and work Statistician. Reproductive health. Screening  You may have the following tests or measurements: Height, weight, and BMI. Blood pressure. Lipid and cholesterol levels. These may be checked every 5 years, or more frequently if you are over 15 years old. Skin check. Lung cancer screening. You may have this screening every year starting at age 24 if you have a 30-pack-year history of smoking and currently smoke or have quit within the past 15 years. Fecal occult blood test (FOBT) of the stool. You may have this test every year starting at age 82. Flexible sigmoidoscopy or colonoscopy. You may have a sigmoidoscopy every 5 years or a colonoscopy every 10 years starting at age 44. Hepatitis C blood test. Hepatitis B blood test. Sexually transmitted disease (STD) testing. Diabetes screening. This is done by checking your blood sugar (glucose) after you have not eaten for a while (fasting). You may have this done every 1-3 years. Bone density scan. This is done to screen for osteoporosis. You may have this done starting at age 21. Mammogram. This may be done every 1-2 years. Talk to your health care provider about how often you should have regular mammograms. Talk with your health care provider about your test results, treatment options, and if necessary, the need for more tests. Vaccines  Your health care provider may recommend certain vaccines, such as: Influenza vaccine. This is recommended every year. Tetanus, diphtheria, and acellular pertussis (Tdap, Td) vaccine. You may need a Td booster every 10 years.  Zoster vaccine. You may need this after age 8. Pneumococcal 13-valent  conjugate (PCV13) vaccine. One dose is recommended after age 44. Pneumococcal polysaccharide (PPSV23) vaccine. One dose is recommended after age 59. Talk to your health care provider about which screenings and vaccines you need and how often you need them. This information is not intended to replace advice given to you by your health care provider. Make sure you discuss any questions you have with your health care provider. Document Released: 11/02/2015 Document Revised: 06/25/2016 Document Reviewed: 08/07/2015 Elsevier Interactive Patient Education  2017 [place] Prevention in the Home Falls can cause injuries. They can happen to people of all ages. There are many things you can do to make your home safe and to help prevent falls. What can I do on the outside of my home? Regularly fix the edges of walkways and driveways and fix any cracks. Remove anything that might make you trip as you walk through a door, such as a raised step or threshold. Trim any bushes or trees on the path to your home. Use bright outdoor lighting. Clear any walking paths of anything that might make someone trip, such as rocks or tools. Regularly check to see if handrails are loose or broken. Make sure that both sides of any steps have handrails. Any raised decks and porches should have guardrails on the edges. Have any leaves, snow, or ice cleared regularly. Use sand or salt on walking paths during winter. Clean up any spills in your garage right away. This includes oil or grease spills. What can I do in the bathroom? Use night lights. Install grab bars by the toilet and in the tub and shower. Do not use towel bars as grab bars. Use non-skid mats or decals in the tub or shower. If you need to sit down in the shower, use a plastic, non-slip stool. Keep the floor dry. Clean up any water that spills on the floor as soon as it happens. Remove soap buildup in the tub or shower regularly. Attach bath mats  securely with double-sided non-slip rug tape. Do not have throw rugs and other things on the floor that can make you trip. What can I do in the bedroom? Use night lights. Make sure that you have a light by your bed that is easy to reach. Do not use any sheets or blankets that are too big for your bed. They should not hang down onto the floor. Have a firm chair that has side arms. You can use this for support while you get dressed. Do not have throw rugs and other things on the floor that can make you trip. What can I do in the kitchen? Clean up any spills right away. Avoid walking on wet floors. Keep items that you use a lot in easy-to-reach places. If you need to reach something above you, use a strong step stool that has a grab bar. Keep electrical cords out of the way. Do not use floor polish or wax that makes floors slippery. If you must use wax, use non-skid floor wax. Do not have throw rugs and other things on the floor that can make you trip. What can I do with my stairs? Do not leave any items on the stairs. Make sure that there are handrails on both sides of the stairs and use them. Fix handrails that are broken or loose. Make sure that handrails are as long as the stairways. Check any carpeting to make sure that  it is firmly attached to the stairs. Fix any carpet that is loose or worn. Avoid having throw rugs at the top or bottom of the stairs. If you do have throw rugs, attach them to the floor with carpet tape. Make sure that you have a light switch at the top of the stairs and the bottom of the stairs. If you do not have them, ask someone to add them for you. What else can I do to help prevent falls? Wear shoes that: Do not have high heels. Have rubber bottoms. Are comfortable and fit you well. Are closed at the toe. Do not wear sandals. If you use a stepladder: Make sure that it is fully opened. Do not climb a closed stepladder. Make sure that both sides of the stepladder  are locked into place. Ask someone to hold it for you, if possible. Clearly mark and make sure that you can see: Any grab bars or handrails. First and last steps. Where the edge of each step is. Use tools that help you move around (mobility aids) if they are needed. These include: Canes. Walkers. Scooters. Crutches. Turn on the lights when you go into a dark area. Replace any light bulbs as soon as they burn out. Set up your furniture so you have a clear path. Avoid moving your furniture around. If any of your floors are uneven, fix them. If there are any pets around you, be aware of where they are. Review your medicines with your doctor. Some medicines can make you feel dizzy. This can increase your chance of falling. Ask your doctor what other things that you can do to help prevent falls. This information is not intended to replace advice given to you by your health care provider. Make sure you discuss any questions you have with your health care provider. Document Released: 08/02/2009 Document Revised: 03/13/2016 Document Reviewed: 11/10/2014 Elsevier Interactive Patient Education  2017 [employer].

## 2021-11-05 NOTE — Progress Notes (Signed)
Subjective:   Rachael Castro is a 88 y.o. female who presents for Medicare Annual (Subsequent) preventive examination.  Virtual Visit via Telephone Note  I connected with  Rachael Castro on 11/05/21 at 10:00 AM EST by telephone and verified that I am speaking with the correct person using two identifiers.  Location: Patient: home Provider: [place] Persons participating in the virtual visit: [place]   I discussed the limitations, risks, security and privacy concerns of performing an evaluation and management service by telephone and the availability of in person appointments. The patient expressed understanding and agreed to proceed.  Interactive audio and video telecommunications were attempted between this nurse and patient, however failed, due to patient having technical difficulties OR patient did not have access to video capability.  We continued and completed visit with audio only.  Some vital signs may be absent or patient reported.   Clemetine Marker, LPN   Review of Systems     Cardiac Risk Factors include: advanced age (>47men, >33 women);diabetes mellitus;dyslipidemia;hypertension     Objective:    There were no vitals filed for this visit. There is no height or weight on file to calculate BMI.  Advanced Directives 11/05/2021 11/01/2020 10/28/2019 09/28/2019 03/30/2019 09/21/2018 09/01/2018  Does Patient Have a Medical Advance Directive? Yes Yes Yes No No No No  Type of Paramedic of [place];Living will [place];Living will [place];Living will - - - -  Copy of [place] in Chart? No - copy requested No - copy requested No - copy requested - - - -  Would patient like information on creating a medical advance directive? - - - No - Patient declined No - Patient declined Yes (MAU/Ambulatory/Procedural Areas - Information given) No - Patient declined  Pre-existing out of  facility DNR order (yellow form or pink MOST form) - - - - - - -    Current Medications (verified) Outpatient Encounter Medications as of 11/05/2021  Medication Sig   atorvastatin (LIPITOR) 80 MG tablet TAKE 1 TABLET BY MOUTH EVERYDAY AT BEDTIME   calcium citrate-vitamin D 200-200 MG-UNIT TABS Take 1 tablet by mouth daily.   loratadine (CLARITIN) 10 MG tablet Take 1 tablet (10 mg total) by mouth at bedtime.   losartan (COZAAR) 25 MG tablet Take 1 tablet (25 mg total) by mouth daily.   Magnesium 100 MG CAPS Take 1 capsule by mouth daily.   Multiple Vitamin (MULTIVITAMIN) tablet Take 1 tablet by mouth daily.   omeprazole (PRILOSEC) 20 MG capsule Take 1 capsule (20 mg total) by mouth daily as needed.   sitaGLIPtin-metformin (JANUMET) 50-500 MG tablet Take 1 tablet by mouth daily.   thiamine (VITAMIN B-1) 100 MG tablet Take 100 mg by mouth daily.   gabapentin (NEURONTIN) 100 MG capsule Take 2 capsules (200 mg total) by mouth at bedtime. (Patient not taking: Reported on 10/22/2021)   [DISCONTINUED] famotidine (PEPCID) 20 MG tablet Take 1 tablet (20 mg total) by mouth 2 (two) times daily for 7 days.   No facility-administered encounter medications on file as of 11/05/2021.    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 Jamal Collin   BREAST  EXCISIONAL BIOPSY Right 1999   Mastectomy   BREAST SURGERY Right 1999   mastectomy   CATARACT EXTRACTION W/PHACO Left 04/30/2015   Procedure: CATARACT EXTRACTION PHACO AND INTRAOCULAR LENS PLACEMENT ([place]);  Surgeon: Estill Cotta, MD;  Location: [facility] ORS;  Service: Ophthalmology;  Laterality: Left;  [place] 01:39 AP% 23.3 CDE 40.42 fluid pack lot # [id number]   COLONOSCOPY  2008   Sankar   EUS  02/12/2012   Procedure: UPPER ENDOSCOPIC ULTRASOUND (EUS) LINEAR;  Surgeon: Milus Banister, MD;  Location: [facility];  Service: Endoscopy;  Laterality: N/A;  radial linear   EYE SURGERY Right 2013   cataract   HERNIA REPAIR  2012   LAPAROTOMY  2013   excision of gastric wall mass    MASTECTOMY     SALPINGOOPHORECTOMY Right 1979   THYROIDECTOMY  1978   TONSILLECTOMY     TUBAL LIGATION     UPPER GI ENDOSCOPY  2013   Family History  Problem Relation Age of Onset   Cancer Mother        cervical   Cancer Father        prostate   Kidney Stones Brother    Glaucoma Brother    Prostatitis Brother    Diabetes Sister    Thyroid disease Brother    Cancer Brother        thyroid   Arthritis Brother    Diabetes Sister    Kidney disease Neg Hx    Bladder Cancer Neg Hx    Breast cancer Neg Hx    Social History   Socioeconomic History   Marital status: Married    Spouse name: Not on file   Number of children: 1   Years of education: Not on file   Highest education level: High school graduate  Occupational History   Not on file  Tobacco Use   Smoking status: Never   Smokeless tobacco: Never  Vaping Use   Vaping Use: Never used  Substance and Sexual Activity   Alcohol use: No   Drug use: No   Sexual activity: Not Currently  Other Topics Concern   Not on file  Social History Narrative   Primary caregiver to husband who had a stroke in Aug 2021   Social Determinants of Health   Financial Resource Strain: Low Risk    Difficulty of Paying Living Expenses: Not very hard  Food  Insecurity: No Food Insecurity   Worried About Charity fundraiser in the Last Year: Never true   Ran Out of Food in the Last Year: Never true  Transportation Needs: No Transportation Needs   Lack of Transportation (Medical): No   Lack of Transportation (Non-Medical): No  Physical Activity: Inactive   Days of Exercise per Week: 0 days   Minutes of Exercise per Session: 0 min  Stress: No Stress Concern Present   Feeling of Stress : Not at all  Social Connections: Moderately Integrated   Frequency of Communication with Friends and Family: More than three times a week  Frequency of Social Gatherings with Friends and Family: More than three times a week   Attends Religious Services: More than 4 times per year   Active Member of [employer] or Organizations: No   Attends Music therapist: Never   Marital Status: Married    Tobacco Counseling Counseling given: Not Answered   Clinical Intake:  Pre-visit preparation completed: Yes  Pain : No/denies pain     Nutritional Risks: None Diabetes: No  How often do you need to have someone help you when you read instructions, pamphlets, or other written materials from your doctor or pharmacy?: 1 - Never  Nutrition Risk Assessment:  Has the patient had any N/V/D within the last 2 months?  No  Does the patient have any non-healing wounds?  No  Has the patient had any unintentional weight loss or weight gain?  No   Diabetes:  Is the patient diabetic?  Yes  If diabetic, was a CBG obtained today?  No  Did the patient bring in their glucometer from home?  No  How often do you monitor your CBG's? As needed per patient.   Financial Strains and Diabetes Management:  Are you having any financial strains with the device, your supplies or your medication? No .  Does the patient want to be seen by Chronic Care Management for management of their diabetes?  No  Would the patient like to be referred to a Nutritionist or for Diabetic  Management?  No   Diabetic Exams:  Diabetic Eye Exam: Completed 01/21/21 negative retinopathy.   Diabetic Foot Exam: Completed 01/25/21.   Interpreter Needed?: No  Information entered by :: Clemetine Marker LPN   Activities of Daily Living In your present state of health, do you have any difficulty performing the following activities: 11/05/2021 09/06/2021  Hearing? Y N  Vision? N N  Difficulty concentrating or making decisions? N N  Walking or climbing stairs? N N  Dressing or bathing? N N  Doing errands, shopping? N N  Preparing Food and eating ? N -  Using the Toilet? N -  In the past six months, have you accidently leaked urine? N -  Do you have problems with loss of bowel control? N -  Managing your Medications? N -  Managing your Finances? N -  Housekeeping or managing your Housekeeping? N -  Some recent data might be hidden    Patient Care Team: Delsa Grana, PA-C as PCP - General (Family Medicine) Gardiner Barefoot, DPM as Consulting Physician (Podiatry) Lucky Cowboy, Erskine Squibb, MD as Referring Physician (Vascular Surgery)  Indicate any recent Medical Services you may have received from other than [facility] providers in the past year (date may be approximate).     Assessment:   This is a routine wellness examination for [place].  Hearing/Vision screen Hearing Screening - Comments:: Pt c/o mild hearing difficulty Vision Screening - Comments:: Annual vision screenings done by Dr. Thomasene Ripple [facility]  Dietary issues and exercise activities discussed: Current Exercise Habits: The patient does not participate in regular exercise at present, Exercise limited by: None identified   Goals Addressed             This Visit's Progress    DIET - INCREASE WATER INTAKE   On track    Recommend drinking 6-8 glasses of water per day        Depression Screen [facility] 2/9 Scores 11/05/2021 09/06/2021 05/27/2021 01/25/2021 11/27/2020 11/01/2020 07/12/2020  PHQ - 2 Score 2 0 0  0 2 0 0  PHQ- 9  Score 2 0 0 0 2 - -  Exception Documentation - - - - - - -    Fall Risk Fall Risk  11/05/2021 09/06/2021 05/27/2021 01/25/2021 11/27/2020  Falls in the past year? 0 0 0 0 1  Number falls in past yr: 0 0 0 0 0  Injury with Fall? 0 0 0 0 0  Comment - - - - -  Risk for fall due to : No Fall Risks No Fall Risks - - -  Follow up Falls prevention discussed Falls prevention discussed - - -  Comment - - - - -    FALL RISK PREVENTION PERTAINING TO THE HOME:  Any stairs in or around the home? Yes  If so, are there any without handrails? Yes  - 1 step outside Home free of loose throw rugs in walkways, pet beds, electrical cords, etc? Yes  Adequate lighting in your home to reduce risk of falls? Yes   ASSISTIVE DEVICES UTILIZED TO PREVENT FALLS:  Life alert? No  Use of a cane, walker or w/c? No  Grab bars in the bathroom? Yes  Shower chair or bench in shower? Yes  Elevated toilet seat or a handicapped toilet? Yes   TIMED UP AND GO:  Was the test performed? No . Telephonic visit.   Cognitive Function: Normal cognitive status assessed by direct observation by this Nurse Health Advisor. No abnormalities found.       6CIT Screen 11/01/2020 10/28/2019 09/21/2018 09/21/2017  What Year? 0 points 0 points 0 points 0 points  What month? 0 points 0 points 0 points 0 points  What time? 0 points 0 points 0 points 0 points  Count back from 20 0 points 0 points 0 points 0 points  Months in reverse 0 points 0 points 0 points 0 points  Repeat phrase 0 points 2 points 2 points 2 points  Total Score 0 2 2 2     Immunizations Immunization History  Administered Date(s) Administered   Fluad Quad(high Dose 65+) 06/10/2019, 07/12/2020   Influenza, High Dose Seasonal PF 06/18/2015, 09/04/2016   Influenza-Unspecified 07/23/2017, 06/02/2018   PFIZER(Purple Top)SARS-COV-2 Vaccination 11/14/2019, 12/05/2019, 07/16/2020, 03/04/2021   Pfizer Covid-19 Vaccine Bivalent Booster 16yrs & up 08/02/2021   Zoster Recombinat  (Shingrix) 01/05/2019, 02/22/2020    TDAP status: Due, Education has been provided regarding the importance of this vaccine. Advised may receive this vaccine at local pharmacy or Health Dept. Aware to provide a copy of the vaccination record if obtained from local pharmacy or Health Dept. Verbalized acceptance and understanding.  Flu Vaccine status: Up to date - per patient received at last office visit 09/06/21 - no documentation  Pneumococcal vaccine status: Declined,  Education has been provided regarding the importance of this vaccine but patient still declined. Advised may receive this vaccine at local pharmacy or Health Dept. Aware to provide a copy of the vaccination record if obtained from local pharmacy or Health Dept. Verbalized acceptance and understanding.   Covid-19 vaccine status: Completed vaccines  Qualifies for Shingles Vaccine? Yes   Zostavax completed No   Shingrix Completed?: Yes  Screening Tests Health Maintenance  Topic Date Due   Pneumonia Vaccine 79+ Years old (1 - PCV) Never done   INFLUENZA VACCINE  01/17/2022 (Originally 05/20/2021)   OPHTHALMOLOGY EXAM  01/21/2022   FOOT EXAM  01/25/2022   DEXA SCAN  01/25/2022   HEMOGLOBIN A1C  03/06/2022   TETANUS/TDAP  04/26/2022  MAMMOGRAM  09/18/2022   COVID-19 Vaccine  Completed   Zoster Vaccines- Shingrix  Completed   HPV VACCINES  Aged Out   COLONOSCOPY (Pts 45-56yrs Insurance coverage will need to be confirmed)  Discontinued    Health Maintenance  Health Maintenance Due  Topic Date Due   Pneumonia Vaccine 36+ Years old (1 - PCV) Never done    Colorectal cancer screening: No longer required.   Mammogram status: Completed 09/18/21. Repeat every year  Bone Density status: Completed 01/26/20. Results reflect: Bone density results: OSTEOPENIA. Repeat every 2 years.  Lung Cancer Screening: (Low Dose CT Chest recommended if Age 10-80 years, 30 pack-year currently smoking OR have quit w/in 15years.) does not  qualify.   Additional Screening:  Hepatitis C Screening: does not qualify  Vision Screening: Recommended annual ophthalmology exams for early detection of glaucoma and other disorders of the eye. Is the patient up to date with their annual eye exam?  Yes  Who is the provider or what is the name of the office in which the patient attends annual eye exams? [facility].   Dental Screening: Recommended annual dental exams for proper oral hygiene  Community Resource Referral / Chronic Care Management: CRR required this visit?  No   CCM required this visit?  No      Plan:     I have personally reviewed and noted the following in the patients chart:   Medical and social history Use of alcohol, tobacco or illicit drugs  Current medications and supplements including opioid prescriptions.  Functional ability and status Nutritional status Physical activity Advanced directives List of other physicians Hospitalizations, surgeries, and ER visits in previous 12 months Vitals Screenings to include cognitive, depression, and falls Referrals and appointments  In addition, I have reviewed and discussed with patient certain preventive protocols, quality metrics, and best practice recommendations. A written personalized care plan for preventive services as well as general preventive health recommendations were provided to patient.     Clemetine Marker, LPN   [id number]/[id number]   Nurse Notes: pt states she needs refill for gabapentin 100 mg sent to [facility]. Pt scheduled for follow up 12/09/21

## 2021-12-06 ENCOUNTER — Other Ambulatory Visit: Payer: Self-pay | Admitting: Family Medicine

## 2021-12-06 ENCOUNTER — Telehealth: Payer: Self-pay | Admitting: Family Medicine

## 2021-12-06 DIAGNOSIS — T7840XD Allergy, unspecified, subsequent encounter: Secondary | ICD-10-CM

## 2021-12-07 NOTE — Telephone Encounter (Signed)
Refusing due to filled two days ago at same pharm. Requested Prescriptions  Pending Prescriptions Disp Refills   loratadine (CLARITIN) 10 MG tablet [Pharmacy Med Name: LORATADINE 10 MG TABLET] 90 tablet     Sig: TAKE 1 TABLET BY MOUTH EVERYDAY AT BEDTIME     Ear, Nose, and Throat:  Antihistamines 2 Passed - 12/06/2021  5:35 PM      Passed - Cr in normal range and within 360 days    Creat  Date Value Ref Range Status  09/06/2021 0.76 0.60 - 0.95 mg/dL Final   Creatinine, Urine  Date Value Ref Range Status  06/10/2019 120 20 - 275 mg/dL Final          Passed - Valid encounter within last 12 months    Recent Outpatient Visits           3 months ago Essential hypertension   [facility] Bo Merino, FNP   6 months ago Type 2 diabetes mellitus without complication, without long-term current use of insulin [facility])   [facility] [place], Bly, PA-C   10 months ago Unintentional weight loss   [facility], Kristeen Miss, PA-C   1 year ago Type 2 diabetes mellitus without complication, without long-term current use of insulin [facility])   [facility] Delsa Grana, PA-C   1 year ago Type 2 diabetes mellitus without complication, without long-term current use of insulin [facility])   [facility] Delsa Grana, PA-C       Future Appointments             In 2 days Reece Packer, Myna Hidalgo, [facility], [place]   In 11 months  [facility], [facility]

## 2021-12-07 NOTE — Telephone Encounter (Signed)
Filled two days ago at same pharm Requested Prescriptions  Pending Prescriptions Disp Refills   loratadine (CLARITIN) 10 MG tablet [Pharmacy Med Name: LORATADINE 10 MG TABLET] 90 tablet     Sig: TAKE 1 TABLET BY MOUTH EVERYDAY AT BEDTIME     Ear, Nose, and Throat:  Antihistamines 2 Passed - 12/06/2021  5:35 PM      Passed - Cr in normal range and within 360 days    Creat  Date Value Ref Range Status  09/06/2021 0.76 0.60 - 0.95 mg/dL Final   Creatinine, Urine  Date Value Ref Range Status  06/10/2019 120 20 - 275 mg/dL Final         Passed - Valid encounter within last 12 months    Recent Outpatient Visits          3 months ago Essential hypertension   [facility] Bo Merino, FNP   6 months ago Type 2 diabetes mellitus without complication, without long-term current use of insulin [facility])   [facility] [place], Diamond, PA-C   10 months ago Unintentional weight loss   [facility], Kristeen Miss, PA-C   1 year ago Type 2 diabetes mellitus without complication, without long-term current use of insulin [facility])   [facility] Delsa Grana, PA-C   1 year ago Type 2 diabetes mellitus without complication, without long-term current use of insulin [facility])   [facility] Delsa Grana, PA-C      Future Appointments            In 2 days Reece Packer, Myna Hidalgo, [facility], [place]   In 11 months  [facility], [facility]

## 2021-12-07 NOTE — Telephone Encounter (Signed)
Requested Prescriptions  Pending Prescriptions Disp Refills   gabapentin (NEURONTIN) 100 MG capsule [Pharmacy Med Name: GABAPENTIN 100 MG CAPSULE] 60 capsule 0    Sig: TAKE 2 CAPSULES BY MOUTH AT BEDTIME.     Neurology: Anticonvulsants - gabapentin Passed - 12/06/2021  5:36 PM      Passed - Cr in normal range and within 360 days    Creat  Date Value Ref Range Status  09/06/2021 0.76 0.60 - 0.95 mg/dL Final   Creatinine, Urine  Date Value Ref Range Status  06/10/2019 120 20 - 275 mg/dL Final         Passed - Completed PHQ-2 or PHQ-9 in the last 360 days      Passed - Valid encounter within last 12 months    Recent Outpatient Visits          3 months ago Essential hypertension   [facility] Serafina Royals F, FNP   6 months ago Type 2 diabetes mellitus without complication, without long-term current use of insulin [facility])   [facility] [place], Kristeen Miss, PA-C   10 months ago Unintentional weight loss   [facility], Kristeen Miss, PA-C   1 year ago Type 2 diabetes mellitus without complication, without long-term current use of insulin [facility])   [facility] Delsa Grana, PA-C   1 year ago Type 2 diabetes mellitus without complication, without long-term current use of insulin [facility])   [facility] Delsa Grana, PA-C      Future Appointments            In 2 days Reece Packer, Myna Hidalgo, [facility], [place]   In 11 months  [facility], [facility]

## 2021-12-09 ENCOUNTER — Ambulatory Visit: Payer: Medicare Other | Admitting: Nurse Practitioner

## 2021-12-09 ENCOUNTER — Encounter: Payer: Self-pay | Admitting: Nurse Practitioner

## 2021-12-09 ENCOUNTER — Other Ambulatory Visit: Payer: Self-pay

## 2021-12-09 VITALS — BP 130/72 | HR 83 | Temp 97.9°F | Resp 14 | Ht 64.0 in | Wt 116.4 lb

## 2021-12-09 DIAGNOSIS — I739 Peripheral vascular disease, unspecified: Secondary | ICD-10-CM

## 2021-12-09 DIAGNOSIS — E119 Type 2 diabetes mellitus without complications: Secondary | ICD-10-CM | POA: Diagnosis not present

## 2021-12-09 DIAGNOSIS — R634 Abnormal weight loss: Secondary | ICD-10-CM

## 2021-12-09 DIAGNOSIS — I10 Essential (primary) hypertension: Secondary | ICD-10-CM

## 2021-12-09 DIAGNOSIS — K219 Gastro-esophageal reflux disease without esophagitis: Secondary | ICD-10-CM

## 2021-12-09 DIAGNOSIS — E782 Mixed hyperlipidemia: Secondary | ICD-10-CM | POA: Diagnosis not present

## 2021-12-09 DIAGNOSIS — C49A0 Gastrointestinal stromal tumor, unspecified site: Secondary | ICD-10-CM

## 2021-12-09 MED ORDER — OMEPRAZOLE 20 MG PO CPDR: 20.0000 mg | DELAYED_RELEASE_CAPSULE | Freq: Every day | ORAL | 1 refills | Status: DC | PRN

## 2021-12-09 MED ORDER — LOSARTAN POTASSIUM 25 MG PO TABS: 25.0000 mg | ORAL_TABLET | Freq: Every day | ORAL | 3 refills | Status: DC

## 2021-12-09 MED ORDER — JANUMET 50-500 MG PO TABS: 1.0000 | ORAL_TABLET | Freq: Every day | ORAL | 3 refills | Status: DC

## 2021-12-09 MED ORDER — ATORVASTATIN CALCIUM 80 MG PO TABS: ORAL_TABLET | ORAL | 3 refills | Status: DC

## 2021-12-09 MED ORDER — GABAPENTIN 100 MG PO CAPS: 200.0000 mg | ORAL_CAPSULE | Freq: Every day | ORAL | 3 refills | Status: DC

## 2021-12-09 NOTE — Telephone Encounter (Signed)
Patient has appointment today will get refills at that time

## 2021-12-09 NOTE — Progress Notes (Signed)
BP 130/72    Pulse 83    Temp 97.9 F (36.6 C) (Oral)    Resp 14    Ht _0  (1.626 m)    Wt 116 lb 6.4 oz (52.8 kg)    SpO2 96%    BMI 19.98 kg/m    Subjective:    Patient ID: Rachael Castro, female    DOB: Mar 11, 1931, 88 y.o.   MRN: [id number]  HPI: Rachael Castro is a 88 y.o. female  Chief Complaint  Patient presents with   Diabetes   Hypertension   Hyperlipidemia    3 month follow up    HTN: She says she takes her blood pressure medication every day. She is currently on losartan 25 mg daily. She denies any chest pain, shortness of breath, headaches or blurred vision.  She says she does not check her blood pressure at home. Her blood pressure today is at [employer]. 130/72. Will continue current treatment.    DM/peripheral neuropathy: She does check her blood sugar about three times a week and has been running around 120s..  She denies any polyphagia, polydipsia or polyuria.  She is currently taking janumet 50-500 mg daily.  Her last A1C was 6.7 on 09/06/2022. She does report that she has neuropathy in her feet and legs.  She is on gabapentin.  Will send refill.  She is on an ARB, foot and eye exam are up to date.   PAD:  Message/paper from home RN visit reporting positive ABI screening at home and dx of PAD - pt denies any pallor, atrophy, wounds/ulcers, no pain with walking longer distances, but occasionally some tingling.  This information was obtained from previous visit with Delsa Grana PA on 05/27/21  .  Referral for vascular surgery was placed at last visit. She saw Dr. Lucky Cowboy and he told her that she does have PAD but it is not limb threatening. They will continue to monitor her PAD.   Hyperlipidemia: She says that she was taking her cholesterol medication at night but now has switched to taking it in the morning because she remembers it better.  Her last LDL was 112. She is currently taking atorvastatin 80 mg daily.  She denies any myalgia.  Will get lipid panel at next visit.     GERD/Gastrointestinal tumor: She last saw Dr. Bonna Gains (GI) on 09/21/2018. She denies any abdominal pain or changes in bowel habits. She says her acid reflux has been giving her no troubles recently.  She currently takes omeprazole 20 mg daily.    Weight loss: Last year she weighed 117 lbs she had gotten down as low as 108  at her last visit.  She says she has been eating more and now her weight has increased to 116 lbs. She says she is doing better about taking care of herself.  She says it was hard for awhile because she was caring for her husband who had a stroke last year.    Relevant past medical, surgical, family and social history reviewed and updated as indicated. Interim medical history since our last visit reviewed. Allergies and medications reviewed and updated.  Review of Systems  Constitutional: Negative for fever or weight change.  Respiratory: Negative for cough and shortness of breath.   Cardiovascular: Negative for chest pain or palpitations.  Gastrointestinal: Negative for abdominal pain, no bowel changes.  Musculoskeletal: Negative for gait problem or joint swelling.  Skin: Negative for rash.  Neurological: Negative for dizziness or headache.  No other specific complaints in a complete review of systems (except as listed in HPI above).      Objective:    BP 130/72    Pulse 83    Temp 97.9 F (36.6 C) (Oral)    Resp 14    Ht _0  (1.626 m)    Wt 116 lb 6.4 oz (52.8 kg)    SpO2 96%    BMI 19.98 kg/m   Wt Readings from Last 3 Encounters:  12/09/21 116 lb 6.4 oz (52.8 kg)  10/22/21 112 lb (50.8 kg)  09/06/21 108 lb 11.2 oz (49.3 kg)    Physical Exam  Constitutional: Patient appears well-developed and well-nourished.  No distress.  HEENT: head atraumatic, normocephalic, pupils equal and reactive to light, neck supple Cardiovascular: Normal rate, regular rhythm and normal heart sounds.  No murmur heard. No BLE edema. Pulmonary/Chest: Effort normal and breath sounds  normal. No respiratory distress. Abdominal: Soft.  There is no tenderness. Psychiatric: Patient has a normal mood and affect. behavior is normal. Judgment and thought content normal.   Results for orders placed or performed in visit on 09/06/21  Lipid panel  Result Value Ref Range   Cholesterol 200 (H) <200 mg/dL   HDL 62 > OR = 50 mg/dL   Triglycerides 146 <150 mg/dL   LDL Cholesterol (Calc) 112 (H) mg/dL (calc)   Total CHOL/HDL Ratio 3.2 <5.0 (calc)   Non-HDL Cholesterol (Calc) 138 (H) <130 mg/dL (calc)  CBC with Differential/Platelet  Result Value Ref Range   WBC 7.2 3.8 - 10.8 Thousand/uL   RBC 3.62 (L) 3.80 - 5.10 Million/uL   Hemoglobin 11.6 (L) 11.7 - 15.5 g/dL   HCT 35.4 35.0 - 45.0 %   MCV 97.8 80.0 - 100.0 fL   MCH 32.0 27.0 - 33.0 pg   MCHC 32.8 32.0 - 36.0 g/dL   RDW 12.6 11.0 - 15.0 %   Platelets 274 140 - 400 Thousand/uL   MPV 10.8 7.5 - 12.5 fL   Neutro Abs 4,918 1,500 - 7,800 cells/uL   Lymphs Abs 1,822 850 - 3,900 cells/uL   Absolute Monocytes 389 200 - 950 cells/uL   Eosinophils Absolute 43 15 - 500 cells/uL   Basophils Absolute 29 0 - 200 cells/uL   Neutrophils Relative % 68.3 %   Total Lymphocyte 25.3 %   Monocytes Relative 5.4 %   Eosinophils Relative 0.6 %   Basophils Relative 0.4 %  COMPLETE METABOLIC PANEL WITH GFR  Result Value Ref Range   Glucose, Bld 93 65 - 99 mg/dL   BUN 16 7 - 25 mg/dL   Creat 0.76 0.60 - 0.95 mg/dL   eGFR 75 > OR = 60 mL/min/1.45m   BUN/Creatinine Ratio NOT APPLICABLE 6 - 22 (calc)   Sodium 143 135 - 146 mmol/L   Potassium 4.2 3.5 - 5.3 mmol/L   Chloride 106 98 - 110 mmol/L   CO2 24 20 - 32 mmol/L   Calcium 9.7 8.6 - 10.4 mg/dL   Total Protein 6.9 6.1 - 8.1 g/dL   Albumin 4.2 3.6 - 5.1 g/dL   Globulin 2.7 1.9 - 3.7 g/dL (calc)   AG Ratio 1.6 1.0 - 2.5 (calc)   Total Bilirubin 0.3 0.2 - 1.2 mg/dL   Alkaline phosphatase (APISO) 71 37 - 153 U/L   AST 16 10 - 35 U/L   ALT 13 6 - 29 U/L  Hemoglobin A1c  Result Value  Ref Range   Hgb A1c MFr Bld 6.7 (  H) <5.7 % of total Hgb   Mean Plasma Glucose 146 mg/dL   eAG (mmol/L) 8.1 mmol/L      Assessment & Plan:   1. Essential hypertension -continue current treatment - losartan (COZAAR) 25 MG tablet; Take 1 tablet (25 mg total) by mouth daily.  Dispense: 90 tablet; Refill: 3  2. Type 2 diabetes mellitus with neuropathy, without long-term current use of insulin (HCC) -continue current treatment - Hemoglobin A1c - sitaGLIPtin-metformin (JANUMET) 50-500 MG tablet; Take 1 tablet by mouth daily.  Dispense: 90 tablet; Refill: 3 - losartan (COZAAR) 25 MG tablet; Take 1 tablet (25 mg total) by mouth daily.  Dispense: 90 tablet; Refill: 3 - gabapentin (NEURONTIN) 100 MG capsule; Take 2 capsules (200 mg total) by mouth at bedtime.  Dispense: 90 capsule; Refill: 3  3. PAD (peripheral artery disease) ([place]) -keep follow-up appointments with vascular  4. Mixed hyperlipidemia -continue current treatment - atorvastatin (LIPITOR) 80 MG tablet; TAKE 1 TABLET BY MOUTH EVERYDAY AT BEDTIME  Dispense: 90 tablet; Refill: 3  5. Gastrointestinal stromal tumor (GIST) (HCC)  - omeprazole (PRILOSEC) 20 MG capsule; Take 1 capsule (20 mg total) by mouth daily as needed.  Dispense: 90 capsule; Refill: 1  6. Gastroesophageal reflux disease without esophagitis - omeprazole (PRILOSEC) 20 MG capsule; Take 1 capsule (20 mg total) by mouth daily as needed.  Dispense: 90 capsule; Refill: 1  7. Unintentional weight loss has improved -she has gained most of her weight back -continue making yourself a priority  Follow up plan: Return in about 3 months (around 03/08/2022) for follow up.

## 2021-12-10 LAB — HEMOGLOBIN A1C
Hgb A1c MFr Bld: 6.6 % of total Hgb — ABNORMAL HIGH (ref <5.7)
Mean Plasma Glucose: 143 mg/dL
eAG (mmol/L): 7.9 mmol/L

## 2021-12-12 ENCOUNTER — Ambulatory Visit: Payer: Medicare Other | Admitting: Podiatry

## 2021-12-12 ENCOUNTER — Encounter: Payer: Self-pay | Admitting: Podiatry

## 2021-12-12 ENCOUNTER — Other Ambulatory Visit: Payer: Self-pay

## 2021-12-12 DIAGNOSIS — B351 Tinea unguium: Secondary | ICD-10-CM

## 2021-12-12 DIAGNOSIS — L84 Corns and callosities: Secondary | ICD-10-CM | POA: Diagnosis not present

## 2021-12-12 DIAGNOSIS — M216X1 Other acquired deformities of right foot: Secondary | ICD-10-CM

## 2021-12-12 DIAGNOSIS — M79674 Pain in right toe(s): Secondary | ICD-10-CM | POA: Diagnosis not present

## 2021-12-12 DIAGNOSIS — M79675 Pain in left toe(s): Secondary | ICD-10-CM | POA: Diagnosis not present

## 2021-12-12 DIAGNOSIS — E119 Type 2 diabetes mellitus without complications: Secondary | ICD-10-CM

## 2021-12-12 DIAGNOSIS — M216X2 Other acquired deformities of left foot: Secondary | ICD-10-CM

## 2021-12-12 DIAGNOSIS — I739 Peripheral vascular disease, unspecified: Secondary | ICD-10-CM

## 2021-12-12 NOTE — Progress Notes (Signed)
This patient presents  to my office for at risk foot care.  This patient requires this care by a professional since this patient will be at risk due to having type 2 diabetes and PAD.  Marland Kitchen  This patient is unable to cut nails callus  since the patient cannot reach her calluses..These calluses are painful walking and wearing shoes.  She is also having ingrown toenails both big toes. This patient presents for at risk foot care today.  General Appearance  Alert, conversant and in no acute stress.  Vascular  Dorsalis pedis and posterior tibial  pulses are palpable  bilaterally.  Capillary return is within normal limits  bilaterally. Temperature is within normal limits  bilaterally.  Neurologic  Senn-Weinstein monofilament wire test within normal limits  bilaterally. Muscle power within normal limits bilaterally.  Nails Normal nails  Bilateral.. No evidence of bacterial infection or drainage bilaterally. Thick ingrown toenails both borders hallux  B/l.  Orthopedic  No limitations of motion  feet .  No crepitus or effusions noted.  No bony pathology .  Hammer toes 2-5  B/L.  Skin  normotropic skin  noted bilaterally.  No signs of infections or ulcers noted.   Callus sub 5th  B/L.  Callus sub 2 left foot.  Clavi fourth toes  B/L.  Callus secondary to plantar flexed metatarsals  B/L.  Clavi  B/L  Consent was obtained for treatment procedures.   Debridement of callus with # 15 blade followed by dremel tool usage. Debride hallux toenails medial and lateral borders  B/L.  Return office visit  3 months                    Told patient to return for periodic foot care and evaluation due to potential at risk complications.   Gardiner Barefoot DPM

## 2022-01-22 LAB — HM DIABETES EYE EXAM

## 2022-03-04 ENCOUNTER — Other Ambulatory Visit: Payer: Self-pay | Admitting: Family Medicine

## 2022-03-04 DIAGNOSIS — E119 Type 2 diabetes mellitus without complications: Secondary | ICD-10-CM

## 2022-03-04 DIAGNOSIS — E782 Mixed hyperlipidemia: Secondary | ICD-10-CM

## 2022-03-04 DIAGNOSIS — I10 Essential (primary) hypertension: Secondary | ICD-10-CM

## 2022-03-07 ENCOUNTER — Ambulatory Visit: Payer: Medicare Other | Admitting: Nurse Practitioner

## 2022-03-07 DIAGNOSIS — R634 Abnormal weight loss: Secondary | ICD-10-CM | POA: Insufficient documentation

## 2022-03-07 NOTE — Progress Notes (Deleted)
   There were no vitals taken for this visit.   Subjective:    Patient ID: Rachael Castro, female    DOB: 1931-07-04, 88 y.o.   MRN: [id number]  HPI: Rachael Castro is a 88 y.o. female  No chief complaint on file.  Hypertension:  Hyperlipidemia:  Type 2 diabetes:  PAD:  GERD/Gastrointestinal stromal tumor:  Unintentional weight loss has improved:  Relevant past medical, surgical, family and social history reviewed and updated as indicated. Interim medical history since our last visit reviewed. Allergies and medications reviewed and updated.  Review of Systems  Per HPI unless specifically indicated above     Objective:    There were no vitals taken for this visit.  Wt Readings from Last 3 Encounters:  12/09/21 116 lb 6.4 oz (52.8 kg)  10/22/21 112 lb (50.8 kg)  09/06/21 108 lb 11.2 oz (49.3 kg)    Physical Exam  Results for orders placed or performed in visit on 02/25/22  HM DIABETES EYE EXAM  Result Value Ref Range   HM Diabetic Eye Exam No Retinopathy No Retinopathy      Assessment & Plan:   Problem List Items Addressed This Visit   None    Follow up plan: No follow-ups on file.

## 2022-03-20 ENCOUNTER — Ambulatory Visit: Payer: Medicare Other | Admitting: Podiatry

## 2022-03-20 ENCOUNTER — Encounter: Payer: Self-pay | Admitting: Podiatry

## 2022-03-20 DIAGNOSIS — B351 Tinea unguium: Secondary | ICD-10-CM

## 2022-03-20 DIAGNOSIS — M216X1 Other acquired deformities of right foot: Secondary | ICD-10-CM

## 2022-03-20 DIAGNOSIS — E119 Type 2 diabetes mellitus without complications: Secondary | ICD-10-CM

## 2022-03-20 DIAGNOSIS — M79675 Pain in left toe(s): Secondary | ICD-10-CM | POA: Diagnosis not present

## 2022-03-20 DIAGNOSIS — M79674 Pain in right toe(s): Secondary | ICD-10-CM | POA: Diagnosis not present

## 2022-03-20 DIAGNOSIS — L84 Corns and callosities: Secondary | ICD-10-CM | POA: Diagnosis not present

## 2022-03-20 DIAGNOSIS — I739 Peripheral vascular disease, unspecified: Secondary | ICD-10-CM

## 2022-03-20 NOTE — Progress Notes (Signed)
This patient presents  to my office for at risk foot care.  This patient requires this care by a professional since this patient will be at risk due to having type 2 diabetes and PAD.  .  This patient is unable to cut nails callus  since the patient cannot reach her calluses..These calluses are painful walking and wearing shoes.  She is also having ingrown toenails both big toes.She also has painful callus on the outside of balls of her feet and corn 4th toe right foot.  This patient presents for at risk foot care today.  General Appearance  Alert, conversant and in no acute stress.  Vascular  Dorsalis pedis and posterior tibial  pulses are  weakly palpable  bilaterally.  Capillary return is within normal limits  bilaterally. Temperature is within normal limits  bilaterally.  Neurologic  Senn-Weinstein monofilament wire test within normal limits  bilaterally. Muscle power within normal limits bilaterally.  Nails Normal nails  Bilateral.. No evidence of bacterial infection or drainage bilaterally. Thick ingrown toenails both borders hallux  B/l.  Orthopedic  No limitations of motion  feet .  No crepitus or effusions noted.  No bony pathology .  Hammer toes 2-5  B/L. HAV  right foot.  Skin  normotropic skin  noted bilaterally.  No signs of infections or ulcers noted.   Callus sub 5th  B/L.   Clavi fourth toes right.  Callus secondary to plantar flexed metatarsals  B/L.  Clavi  B/L  Onychomycosis  Hallux  B/l.  Consent was obtained for treatment procedures.   Debridement of callus with # 15 blade followed by dremel tool usage.  Debride hallux  B/L.  Return office visit  3 months                    Told patient to return for periodic foot care and evaluation due to potential at risk complications.   Siobhan Zaro DPM  

## 2022-03-24 ENCOUNTER — Ambulatory Visit: Payer: Medicare Other | Admitting: Nurse Practitioner

## 2022-03-24 NOTE — Progress Notes (Deleted)
   There were no vitals taken for this visit.   Subjective:    Patient ID: [name], female    DOB: [birth year], 88 y.o.   MRN: [id number]  HPI: Elsey Holts is a 88 y.o. female  No chief complaint on file.  HTN: She is currently taking losartan 25 mg daily.  She does not check her blood pressure at home.  But she does not eyes any chest pain, headaches, shortness of breath or blurred vision.  Her blood pressure today is ***.  We will continue with current treatment.   DM/peripheral neuropathy: She says her blood sugars been running about ***.  She is currently taking Janumet 50-500 mg daily.  She does report that she has some neuropathy in her feet and legs.  She is currently on gabapentin.  She denies any polyuria, polyphasia or polydipsia.  Her last A1c was 6.6 on 12/09/2021.  She is up-to-date on her eye and foot exam.    PAD: At last visit on 12/09/2021 she brought in a message from home RN that reported positive ABI screening at home and diagnosed with PAD.patient denies any pallor, atrophy, ulcers or wounds she is experiencing no pain but does occasionally have some tingling.  Pt denies any pallor, atrophy, wounds/ulcers, no pain with walking longer distances, but occasionally some tingling.   She saw Dr. Lucky Cowboy and he told her that she does have PAD but it is not limb threatening. They will continue to monitor her PAD.  No changes.   Hyperlipidemia: He has been taking her cholesterol medication at night.  She is currently taking atorvastatin 80 mg daily she denies any myalgia.  Her last LDL was 112 on 09/06/2021.  We will get lab work today.   GERD/Gastrointestinal tumor: She says that her acid reflux has not been giving her any issues lately.  She is currently taking omeprazole 20 mg daily she last saw Dr. Bonna Gains (GI) on 09/21/2018.  No changes at this time.  Relevant past medical, surgical, family and social history reviewed and updated as indicated. Interim medical history  since our last visit reviewed. Allergies and medications reviewed and updated.  Review of Systems  Per HPI unless specifically indicated above     Objective:    There were no vitals taken for this visit.  Wt Readings from Last 3 Encounters:  12/09/21 116 lb 6.4 oz (52.8 kg)  10/22/21 112 lb (50.8 kg)  09/06/21 108 lb 11.2 oz (49.3 kg)    Physical Exam  Results for orders placed or performed in visit on 02/25/22  HM DIABETES EYE EXAM  Result Value Ref Range   HM Diabetic Eye Exam No Retinopathy No Retinopathy      Assessment & Plan:   Problem List Items Addressed This Visit   None    Follow up plan: No follow-ups on file.

## 2022-03-25 ENCOUNTER — Other Ambulatory Visit: Payer: Self-pay

## 2022-03-25 ENCOUNTER — Ambulatory Visit: Payer: Medicare Other | Admitting: Nurse Practitioner

## 2022-03-25 ENCOUNTER — Encounter: Payer: Self-pay | Admitting: Nurse Practitioner

## 2022-03-25 DIAGNOSIS — K219 Gastro-esophageal reflux disease without esophagitis: Secondary | ICD-10-CM | POA: Diagnosis not present

## 2022-03-25 DIAGNOSIS — I739 Peripheral vascular disease, unspecified: Secondary | ICD-10-CM | POA: Diagnosis not present

## 2022-03-25 DIAGNOSIS — E782 Mixed hyperlipidemia: Secondary | ICD-10-CM

## 2022-03-25 DIAGNOSIS — I10 Essential (primary) hypertension: Secondary | ICD-10-CM | POA: Diagnosis not present

## 2022-03-25 DIAGNOSIS — C49A0 Gastrointestinal stromal tumor, unspecified site: Secondary | ICD-10-CM | POA: Diagnosis not present

## 2022-03-25 DIAGNOSIS — E119 Type 2 diabetes mellitus without complications: Secondary | ICD-10-CM

## 2022-03-25 NOTE — Assessment & Plan Note (Signed)
Blood pressure at goal today.  Continue taking losartan 25 mg daily.

## 2022-03-25 NOTE — Assessment & Plan Note (Signed)
Continue taking omeprazole 20 mg daily.  Follow-up with GI for any worsening symptoms.

## 2022-03-25 NOTE — Progress Notes (Signed)
BP 124/74   Pulse 85   Temp 98.2 F (36.8 C) (Oral)   Resp 16   Wt 109 lb 11.2 oz (49.8 kg)   SpO2 98%   BMI 18.83 kg/m    Subjective:    Patient ID: Rachael Castro, female    DOB: 1931-05-14, 88 y.o.   MRN: [id number]  HPI: Rachael Castro is a 88 y.o. female  Chief Complaint  Patient presents with   Hypertension   Diabetes   Hyperlipidemia   Gastroesophageal Reflux   HTN: She is currently taking losartan 25 mg daily.  She does not check her blood pressure at home.  But she does not complain of any chest pain, headaches, shortness of breath or blurred vision.  Her blood pressure today is 124/74.  We will continue with the current treatment.   DM/peripheral neuropathy: She says her blood sugars have been running about 120-126.  She is currently taking Janumet 50-500 mg daily.  She does report that she has some neuropathy in her feet and legs.  She is currently on gabapentin.  She says this really helps.  She denies any polyuria, poly aphasia or polydipsia her last A1c was 6.6 on 12/09/2021.  She is up-to-date on her eye and foot exam.   PAD: At a previous visit she  brought in a message from home RN that reported positive ABI screening at home and diagnosed with PAD.  Patient denies any pallor, atrophy, ulcers or wounds.  She is experiencing some tingling in her legs but she says the gabapentin is helping with that.  She saw Dr. Dian Situ vascular and he told her that she does have PAD but it is not limb threatening.  They will continue to monitor PAD.  No changes at this time.   Hyperlipidemia: She has been taking her cholesterol medication in the morning.  She is currently taking atorvastatin 80 mg daily.  She denies any myalgia.  Her last LDL was 112 on 09/06/2021.  We will get lab work today.  GERD/Gastrointestinal tumor: She says that her acid reflux has not been giving her any issues lately.  She is currently taking omeprazole 20 mg daily.  She saw Dr. Bonna Gains (GI) on  09/21/2018.  Patient denies any blood in her stool.  She says she is not scheduled to follow-up with them unless she has another issue.  No changes at this time.  Relevant past medical, surgical, family and social history reviewed and updated as indicated. Interim medical history since our last visit reviewed. Allergies and medications reviewed and updated.  Review of Systems  Constitutional: Negative for fever or weight change.  Respiratory: Negative for cough and shortness of breath.   Cardiovascular: Negative for chest pain or palpitations.  Gastrointestinal: Negative for abdominal pain, no bowel changes.  Musculoskeletal: Negative for gait problem or joint swelling.  Skin: Negative for rash.  Neurological: Negative for dizziness or headache.  No other specific complaints in a complete review of systems (except as listed in HPI above).      Objective:    BP 124/74   Pulse 85   Temp 98.2 F (36.8 C) (Oral)   Resp 16   Wt 109 lb 11.2 oz (49.8 kg)   SpO2 98%   BMI 18.83 kg/m   Wt Readings from Last 3 Encounters:  03/25/22 109 lb 11.2 oz (49.8 kg)  12/09/21 116 lb 6.4 oz (52.8 kg)  10/22/21 112 lb (50.8 kg)    Physical Exam  Constitutional: Patient appears well-developed and well-nourished.  No distress.  HEENT: head atraumatic, normocephalic, pupils equal and reactive to light, neck supple Cardiovascular: Normal rate, regular rhythm and normal heart sounds.  No murmur heard. No BLE edema. Pulmonary/Chest: Effort normal and breath sounds normal. No respiratory distress. Abdominal: Soft.  There is no tenderness. Psychiatric: Patient has a normal mood and affect. behavior is normal. Judgment and thought content normal.  Results for orders placed or performed in visit on 02/25/22  HM DIABETES EYE EXAM  Result Value Ref Range   HM Diabetic Eye Exam No Retinopathy No Retinopathy      Assessment & Plan:   Problem List Items Addressed This Visit       Cardiovascular and  Mediastinum   Essential hypertension    Blood pressure at goal today.  Continue taking losartan 25 mg daily.       PAD (peripheral artery disease) (HCC)    Continue taking gabapentin 200 mg at bedtime.  She is not having any worsening symptoms.         Digestive   Gastrointestinal stromal tumor (GIST) ([place])    Continue taking omeprazole 20 mg daily.  Follow-up with GI for any worsening symptoms.      Gastroesophageal reflux disease    Continue taking omeprazole 20 mg daily.  Follow-up with GI for any worsening symptoms.         Endocrine   Type 2 diabetes mellitus without complication, without long-term current use of insulin ([place])    She says her blood sugars have been running about 120-126.  Continue current treatment of Janumet 50-500 mg daily.         Other   Mixed hyperlipidemia    Continue taking atorvastatin 80 mg daily.  We will get labs today.         Follow up plan: Return in about 3 months (around 06/25/2022) for follow up.

## 2022-03-25 NOTE — Assessment & Plan Note (Signed)
Continue taking atorvastatin 80 mg daily.  We will get labs today.

## 2022-03-25 NOTE — Assessment & Plan Note (Signed)
Continue taking gabapentin 200 mg at bedtime.  She is not having any worsening symptoms.

## 2022-03-25 NOTE — Assessment & Plan Note (Signed)
She says her blood sugars have been running about 120-126.  Continue current treatment of Janumet 50-500 mg daily.

## 2022-04-01 ENCOUNTER — Other Ambulatory Visit: Payer: Self-pay | Admitting: Nurse Practitioner

## 2022-04-02 LAB — LIPID PANEL
Cholesterol: 177 mg/dL (ref <200)
HDL: 64 mg/dL (ref >=50)
LDL Cholesterol (Calc): 97 mg/dL (calc)
Non-HDL Cholesterol (Calc): 113 mg/dL (calc) (ref <130)
Total CHOL/HDL Ratio: 2.8 (calc) (ref <5.0)
Triglycerides: 75 mg/dL (ref <150)

## 2022-04-02 LAB — COMPLETE METABOLIC PANEL WITH GFR
AG Ratio: 1.6 (calc) (ref 1.0–2.5)
ALT: 13 U/L (ref 6–29)
AST: 16 U/L (ref 10–35)
Albumin: 4.1 g/dL (ref 3.6–5.1)
Alkaline phosphatase (APISO): 70 U/L (ref 37–153)
BUN: 21 mg/dL (ref 7–25)
CO2: 26 mmol/L (ref 20–32)
Calcium: 9.8 mg/dL (ref 8.6–10.4)
Chloride: 100 mmol/L (ref 98–110)
Creat: 0.86 mg/dL (ref 0.60–0.95)
Globulin: 2.6 g/dL (calc) (ref 1.9–3.7)
Glucose, Bld: 225 mg/dL — ABNORMAL HIGH (ref 65–99)
Potassium: 4.3 mmol/L (ref 3.5–5.3)
Sodium: 139 mmol/L (ref 135–146)
Total Bilirubin: 0.4 mg/dL (ref 0.2–1.2)
Total Protein: 6.7 g/dL (ref 6.1–8.1)
eGFR: 65 mL/min/{1.73_m2} (ref >=60)

## 2022-04-02 LAB — CBC WITH DIFFERENTIAL/PLATELET
Absolute Monocytes: 350 cells/uL (ref 200–950)
Basophils Absolute: 21 cells/uL (ref 0–200)
Basophils Relative: 0.3 %
Eosinophils Absolute: 49 cells/uL (ref 15–500)
Eosinophils Relative: 0.7 %
HCT: 35.7 % (ref 35.0–45.0)
Hemoglobin: 11.6 g/dL — ABNORMAL LOW (ref 11.7–15.5)
Lymphs Abs: 1750 cells/uL (ref 850–3900)
MCH: 31.8 pg (ref 27.0–33.0)
MCHC: 32.5 g/dL (ref 32.0–36.0)
MCV: 97.8 fL (ref 80.0–100.0)
MPV: 10.2 fL (ref 7.5–12.5)
Monocytes Relative: 5 %
Neutro Abs: 4830 cells/uL (ref 1500–7800)
Neutrophils Relative %: 69 %
Platelets: 260 10*3/uL (ref 140–400)
RBC: 3.65 10*6/uL — ABNORMAL LOW (ref 3.80–5.10)
RDW: 12.4 % (ref 11.0–15.0)
Total Lymphocyte: 25 %
WBC: 7 10*3/uL (ref 3.8–10.8)

## 2022-04-02 LAB — HEMOGLOBIN A1C
Hgb A1c MFr Bld: 6.7 % of total Hgb — ABNORMAL HIGH (ref <5.7)
Mean Plasma Glucose: 146 mg/dL
eAG (mmol/L): 8.1 mmol/L

## 2022-06-26 ENCOUNTER — Ambulatory Visit: Payer: Medicare Other | Admitting: Podiatry

## 2022-07-15 ENCOUNTER — Other Ambulatory Visit: Payer: Self-pay | Admitting: Family Medicine

## 2022-07-15 DIAGNOSIS — T7840XD Allergy, unspecified, subsequent encounter: Secondary | ICD-10-CM

## 2022-07-17 ENCOUNTER — Ambulatory Visit: Payer: Medicare Other | Admitting: Podiatry

## 2022-07-17 ENCOUNTER — Encounter: Payer: Self-pay | Admitting: Podiatry

## 2022-07-17 DIAGNOSIS — E119 Type 2 diabetes mellitus without complications: Secondary | ICD-10-CM | POA: Diagnosis not present

## 2022-07-17 DIAGNOSIS — M216X1 Other acquired deformities of right foot: Secondary | ICD-10-CM | POA: Diagnosis not present

## 2022-07-17 DIAGNOSIS — I739 Peripheral vascular disease, unspecified: Secondary | ICD-10-CM | POA: Diagnosis not present

## 2022-07-17 DIAGNOSIS — M79675 Pain in left toe(s): Secondary | ICD-10-CM | POA: Diagnosis not present

## 2022-07-17 DIAGNOSIS — B351 Tinea unguium: Secondary | ICD-10-CM

## 2022-07-17 DIAGNOSIS — L84 Corns and callosities: Secondary | ICD-10-CM | POA: Diagnosis not present

## 2022-07-17 DIAGNOSIS — M79674 Pain in right toe(s): Secondary | ICD-10-CM

## 2022-07-17 NOTE — Progress Notes (Signed)
This patient presents  to my office for at risk foot care.  This patient requires this care by a professional since this patient will be at risk due to having type 2 diabetes and PAD.  Marland Kitchen  This patient is unable to cut nails callus  since the patient cannot reach her calluses..These calluses are painful walking and wearing shoes.  She is also having ingrown toenails both big toes.She also has painful callus on the outside of balls of her feet and corn 4th toe right foot.  This patient presents for at risk foot care today.  General Appearance  Alert, conversant and in no acute stress.  Vascular  Dorsalis pedis and posterior tibial  pulses are  weakly palpable  bilaterally.  Capillary return is within normal limits  bilaterally. Temperature is within normal limits  bilaterally.  Neurologic  Senn-Weinstein monofilament wire test within normal limits  bilaterally. Muscle power within normal limits bilaterally.  Nails Normal nails  Bilateral.. No evidence of bacterial infection or drainage bilaterally. Thick ingrown toenails both borders hallux  B/l.  Orthopedic  No limitations of motion  feet .  No crepitus or effusions noted.  No bony pathology .  Hammer toes 2-5  B/L. HAV  right foot.  Skin  normotropic skin  noted bilaterally.  No signs of infections or ulcers noted.   Callus sub 5th  B/L.   Clavi fourth toes right.  Callus secondary to plantar flexed metatarsals  B/L.  Clavi  B/L  Onychomycosis  Hallux  B/l.  Consent was obtained for treatment procedures.   Debridement of callus with # 15 blade followed by dremel tool usage.  Debride hallux  B/L.  Return office visit  3 months                    Told patient to return for periodic foot care and evaluation due to potential at risk complications.   Gardiner Barefoot DPM

## 2022-07-22 ENCOUNTER — Other Ambulatory Visit: Payer: Self-pay

## 2022-07-22 ENCOUNTER — Encounter: Payer: Self-pay | Admitting: Nurse Practitioner

## 2022-07-22 ENCOUNTER — Ambulatory Visit: Payer: Medicare Other | Admitting: Nurse Practitioner

## 2022-07-22 VITALS — BP 130/84 | HR 80 | Temp 97.5°F | Resp 14 | Ht 64.0 in | Wt 105.9 lb

## 2022-07-22 DIAGNOSIS — I739 Peripheral vascular disease, unspecified: Secondary | ICD-10-CM

## 2022-07-22 DIAGNOSIS — E782 Mixed hyperlipidemia: Secondary | ICD-10-CM

## 2022-07-22 DIAGNOSIS — Z23 Encounter for immunization: Secondary | ICD-10-CM | POA: Diagnosis not present

## 2022-07-22 DIAGNOSIS — E119 Type 2 diabetes mellitus without complications: Secondary | ICD-10-CM | POA: Diagnosis not present

## 2022-07-22 DIAGNOSIS — C49A0 Gastrointestinal stromal tumor, unspecified site: Secondary | ICD-10-CM

## 2022-07-22 DIAGNOSIS — K219 Gastro-esophageal reflux disease without esophagitis: Secondary | ICD-10-CM

## 2022-07-22 DIAGNOSIS — I10 Essential (primary) hypertension: Secondary | ICD-10-CM

## 2022-07-22 MED ORDER — GABAPENTIN 100 MG PO CAPS: 200.0000 mg | ORAL_CAPSULE | Freq: Every day | ORAL | 3 refills | Status: DC

## 2022-07-22 MED ORDER — OMEPRAZOLE 20 MG PO CPDR: 20.0000 mg | DELAYED_RELEASE_CAPSULE | Freq: Every day | ORAL | 1 refills | Status: DC | PRN

## 2022-07-22 MED ORDER — ATORVASTATIN CALCIUM 80 MG PO TABS: ORAL_TABLET | ORAL | 3 refills | Status: DC

## 2022-07-22 MED ORDER — LOSARTAN POTASSIUM 25 MG PO TABS: 25.0000 mg | ORAL_TABLET | Freq: Every day | ORAL | 3 refills | Status: DC

## 2022-07-22 MED ORDER — JANUMET 50-500 MG PO TABS: 1.0000 | ORAL_TABLET | Freq: Every day | ORAL | 3 refills | Status: DC

## 2022-07-22 NOTE — Assessment & Plan Note (Signed)
Continue taking omeprazole 20 mg daily.  Her GI doctor is Dr. Bonna Gains.  She is to follow-up with them if she has any worsening symptoms

## 2022-07-22 NOTE — Progress Notes (Signed)
BP 130/84   Pulse 80   Temp (!) 97.5 F (36.4 C) (Oral)   Resp 14   Ht $R'5\' 4"'Ev$  (1.626 m)   Wt 105 lb 14.4 oz (48 kg)   SpO2 98%   BMI 18.18 kg/m    Subjective:    Patient ID: Rachael Castro, female    DOB: Oct 27, 1930, 88 y.o.   MRN: [id number]  HPI: Rachael Castro is a 88 y.o. female  Chief Complaint  Patient presents with   Hypertension   Diabetes   Gastroesophageal Reflux   Hyperlipidemia    4 month follow up   HTN: Blood pressure today is 130/84.  She is currently taking losartan 25 mg daily.  Does not take her blood pressure at home but denies any chest pain, shortness of breath, headaches or blurred vision.   DM/peripheral neuropathy: Her last A1c was 6.7 on 04/01/2022.  She says her blood sugars have been running around 168, 165. She currently takes Janumet 50-500 mg daily. She has been out of her gabapentin for a while so her legs have really been bothering her.  Will send in refill of gabapentin.  She is up-to-date on her foot and eye exam.   PAD: Patient was diagnosed with PAD.  Patient denies any pallor, atrophy, ulcers or wounds.  She does have tingling in her legs but the gabapentin helps with that.  She saw Dr. Dian Situ vascular surgery and he told her that she does have PAD but it is not limb threatening.  They will continue to monitor her PAD.   Hyperlipidemia: Her last LDL was 97 on 04/01/2022.  She is currently taking atorvastatin 80 mg daily.  She denies any myalgia.  GERD/Gastrointestinal tumor: Patient currently takes omeprazole 20 mg daily.  She is denies having any issues with her acid reflux as long as she takes her medication.  Her gastroenterologist is Dr. Bonna Gains.  She denies any blood in her stool.  She last saw GI in 09/21/2018.  She says she does not have a scheduled follow-up with them unless she has another issue.  Relevant past medical, surgical, family and social history reviewed and updated as indicated. Interim medical history since our last  visit reviewed. Allergies and medications reviewed and updated.  Review of Systems  Constitutional: Negative for fever or weight change.  Respiratory: Negative for cough and shortness of breath.   Cardiovascular: Negative for chest pain or palpitations.  Gastrointestinal: Negative for abdominal pain, no bowel changes.  Musculoskeletal: Negative for gait problem or joint swelling.  Skin: Negative for rash.  Neurological: Negative for dizziness or headache.  No other specific complaints in a complete review of systems (except as listed in HPI above).      Objective:    BP 130/84   Pulse 80   Temp (!) 97.5 F (36.4 C) (Oral)   Resp 14   Ht $R'5\' 4"'It$  (1.626 m)   Wt 105 lb 14.4 oz (48 kg)   SpO2 98%   BMI 18.18 kg/m   Wt Readings from Last 3 Encounters:  07/22/22 105 lb 14.4 oz (48 kg)  03/25/22 109 lb 11.2 oz (49.8 kg)  12/09/21 116 lb 6.4 oz (52.8 kg)    Physical Exam  Constitutional: Patient appears well-developed and well-nourished.  No distress.  HEENT: head atraumatic, normocephalic, pupils equal and reactive to light, neck supple Cardiovascular: Normal rate, regular rhythm and normal heart sounds.  No murmur heard. No BLE edema. Pulmonary/Chest: Effort normal and breath  sounds normal. No respiratory distress. Abdominal: Soft.  There is no tenderness. Psychiatric: Patient has a normal mood and affect. behavior is normal. Judgment and thought content normal.  Results for orders placed or performed in visit on 04/01/22  Lipid panel  Result Value Ref Range   Cholesterol 177 <200 mg/dL   HDL 64 > OR = 50 mg/dL   Triglycerides 75 <150 mg/dL   LDL Cholesterol (Calc) 97 mg/dL (calc)   Total CHOL/HDL Ratio 2.8 <5.0 (calc)   Non-HDL Cholesterol (Calc) 113 <130 mg/dL (calc)  COMPLETE METABOLIC PANEL WITH GFR  Result Value Ref Range   Glucose, Bld 225 (H) 65 - 99 mg/dL   BUN 21 7 - 25 mg/dL   Creat 0.86 0.60 - 0.95 mg/dL   eGFR 65 > OR = 60 mL/min/1.70m2   BUN/Creatinine  Ratio NOT APPLICABLE 6 - 22 (calc)   Sodium 139 135 - 146 mmol/L   Potassium 4.3 3.5 - 5.3 mmol/L   Chloride 100 98 - 110 mmol/L   CO2 26 20 - 32 mmol/L   Calcium 9.8 8.6 - 10.4 mg/dL   Total Protein 6.7 6.1 - 8.1 g/dL   Albumin 4.1 3.6 - 5.1 g/dL   Globulin 2.6 1.9 - 3.7 g/dL (calc)   AG Ratio 1.6 1.0 - 2.5 (calc)   Total Bilirubin 0.4 0.2 - 1.2 mg/dL   Alkaline phosphatase (APISO) 70 37 - 153 U/L   AST 16 10 - 35 U/L   ALT 13 6 - 29 U/L  Hemoglobin A1c  Result Value Ref Range   Hgb A1c MFr Bld 6.7 (H) <5.7 % of total Hgb   Mean Plasma Glucose 146 mg/dL   eAG (mmol/L) 8.1 mmol/L  CBC with Differential/Platelet  Result Value Ref Range   WBC 7.0 3.8 - 10.8 Thousand/uL   RBC 3.65 (L) 3.80 - 5.10 Million/uL   Hemoglobin 11.6 (L) 11.7 - 15.5 g/dL   HCT 35.7 35.0 - 45.0 %   MCV 97.8 80.0 - 100.0 fL   MCH 31.8 27.0 - 33.0 pg   MCHC 32.5 32.0 - 36.0 g/dL   RDW 12.4 11.0 - 15.0 %   Platelets 260 140 - 400 Thousand/uL   MPV 10.2 7.5 - 12.5 fL   Neutro Abs 4,830 1,500 - 7,800 cells/uL   Lymphs Abs 1,750 850 - 3,900 cells/uL   Absolute Monocytes 350 200 - 950 cells/uL   Eosinophils Absolute 49 15 - 500 cells/uL   Basophils Absolute 21 0 - 200 cells/uL   Neutrophils Relative % 69 %   Total Lymphocyte 25.0 %   Monocytes Relative 5.0 %   Eosinophils Relative 0.7 %   Basophils Relative 0.3 %      Assessment & Plan:   Problem List Items Addressed This Visit       Cardiovascular and Mediastinum   Essential hypertension    Patient is currently taking losartan 25 mg daily.  We will continue with this current treatment.      Relevant Medications   losartan (COZAAR) 25 MG tablet   atorvastatin (LIPITOR) 80 MG tablet   Other Relevant Orders   CBC with Differential/Platelet   COMPLETE METABOLIC PANEL WITH GFR   PAD (peripheral artery disease) (HCC)    Continue taking gabapentin 200 mg at bedtime.  She denies having any worsening symptoms.  Her vascular surgeon is Dr. Dian Situ.       Relevant Medications   losartan (COZAAR) 25 MG tablet   atorvastatin (LIPITOR) 80 MG tablet  Digestive   Gastrointestinal stromal tumor (GIST) ([place])    Continue taking omeprazole 20 mg daily.  Her GI doctor is Dr. Bonna Gains.  She is to follow-up with them if she has any worsening symptoms      Relevant Medications   omeprazole (PRILOSEC) 20 MG capsule   Gastroesophageal reflux disease    Continue taking omeprazole 20 mg daily.  Her GI doctor is Dr. Bonna Gains.  She is to follow-up with them if she has any worsening symptoms      Relevant Medications   omeprazole (PRILOSEC) 20 MG capsule     Endocrine   Type 2 diabetes mellitus without complication, without long-term current use of insulin (HCC) - Primary    Last A1c was 6.7 on 04/01/2022.  Patient states that her blood sugars been run about 168 165.  She currently takes Janumet 50-500 mg daily.      Relevant Medications   gabapentin (NEURONTIN) 100 MG capsule   sitaGLIPtin-metformin (JANUMET) 50-500 MG tablet   losartan (COZAAR) 25 MG tablet   atorvastatin (LIPITOR) 80 MG tablet   Other Relevant Orders   COMPLETE METABOLIC PANEL WITH GFR   Hemoglobin A1c     Other   Mixed hyperlipidemia    Continue taking atorvastatin 80 mg daily.  We are getting labs today.      Relevant Medications   losartan (COZAAR) 25 MG tablet   atorvastatin (LIPITOR) 80 MG tablet   Other Relevant Orders   Lipid panel   COMPLETE METABOLIC PANEL WITH GFR   Other Visit Diagnoses     Need for influenza vaccination       Relevant Orders   Flu Vaccine QUAD High Dose(Fluad) (Completed)        Follow up plan: Return in about 4 months (around 11/22/2022) for follow up.

## 2022-07-22 NOTE — Assessment & Plan Note (Signed)
Continue taking atorvastatin 80 mg daily.  We are getting labs today.

## 2022-07-22 NOTE — Assessment & Plan Note (Signed)
Continue taking gabapentin 200 mg at bedtime.  She denies having any worsening symptoms.  Her vascular surgeon is Dr. Dian Situ.

## 2022-07-22 NOTE — Assessment & Plan Note (Signed)
Last A1c was 6.7 on 04/01/2022.  Patient states that her blood sugars been run about 168 165.  She currently takes Janumet 50-500 mg daily.

## 2022-07-22 NOTE — Assessment & Plan Note (Signed)
Patient is currently taking losartan 25 mg daily.  We will continue with this current treatment.

## 2022-07-23 ENCOUNTER — Ambulatory Visit: Payer: Medicare Other | Admitting: Nurse Practitioner

## 2022-07-23 LAB — COMPLETE METABOLIC PANEL WITH GFR
AG Ratio: 1.7 (calc) (ref 1.0–2.5)
ALT: 11 U/L (ref 6–29)
AST: 17 U/L (ref 10–35)
Albumin: 4.5 g/dL (ref 3.6–5.1)
Alkaline phosphatase (APISO): 67 U/L (ref 37–153)
BUN: 14 mg/dL (ref 7–25)
CO2: 26 mmol/L (ref 20–32)
Calcium: 9.8 mg/dL (ref 8.6–10.4)
Chloride: 103 mmol/L (ref 98–110)
Creat: 0.75 mg/dL (ref 0.60–0.95)
Globulin: 2.6 g/dL (calc) (ref 1.9–3.7)
Glucose, Bld: 138 mg/dL (ref 65–139)
Potassium: 3.8 mmol/L (ref 3.5–5.3)
Sodium: 140 mmol/L (ref 135–146)
Total Bilirubin: 0.4 mg/dL (ref 0.2–1.2)
Total Protein: 7.1 g/dL (ref 6.1–8.1)
eGFR: 77 mL/min/{1.73_m2} (ref >=60)

## 2022-07-23 LAB — CBC WITH DIFFERENTIAL/PLATELET
Absolute Monocytes: 504 cells/uL (ref 200–950)
Basophils Absolute: 21 cells/uL (ref 0–200)
Basophils Relative: 0.3 %
Eosinophils Absolute: 83 cells/uL (ref 15–500)
Eosinophils Relative: 1.2 %
HCT: 33.8 % — ABNORMAL LOW (ref 35.0–45.0)
Hemoglobin: 11.4 g/dL — ABNORMAL LOW (ref 11.7–15.5)
Lymphs Abs: 1573 cells/uL (ref 850–3900)
MCH: 32.5 pg (ref 27.0–33.0)
MCHC: 33.7 g/dL (ref 32.0–36.0)
MCV: 96.3 fL (ref 80.0–100.0)
MPV: 10.1 fL (ref 7.5–12.5)
Monocytes Relative: 7.3 %
Neutro Abs: 4720 cells/uL (ref 1500–7800)
Neutrophils Relative %: 68.4 %
Platelets: 258 10*3/uL (ref 140–400)
RBC: 3.51 10*6/uL — ABNORMAL LOW (ref 3.80–5.10)
RDW: 12.4 % (ref 11.0–15.0)
Total Lymphocyte: 22.8 %
WBC: 6.9 10*3/uL (ref 3.8–10.8)

## 2022-07-23 LAB — HM DIABETES EYE EXAM

## 2022-07-23 LAB — HEMOGLOBIN A1C
Hgb A1c MFr Bld: 6.6 % of total Hgb — ABNORMAL HIGH (ref <5.7)
Mean Plasma Glucose: 143 mg/dL
eAG (mmol/L): 7.9 mmol/L

## 2022-07-23 LAB — LIPID PANEL
Cholesterol: 209 mg/dL — ABNORMAL HIGH (ref <200)
HDL: 61 mg/dL (ref >=50)
LDL Cholesterol (Calc): 126 mg/dL (calc) — ABNORMAL HIGH
Non-HDL Cholesterol (Calc): 148 mg/dL (calc) — ABNORMAL HIGH (ref <130)
Total CHOL/HDL Ratio: 3.4 (calc) (ref <5.0)
Triglycerides: 109 mg/dL (ref <150)

## 2022-07-25 ENCOUNTER — Ambulatory Visit: Payer: Medicare Other | Admitting: Nurse Practitioner

## 2022-07-28 ENCOUNTER — Other Ambulatory Visit: Payer: Self-pay | Admitting: Family Medicine

## 2022-08-18 ENCOUNTER — Encounter (INDEPENDENT_AMBULATORY_CARE_PROVIDER_SITE_OTHER): Payer: Self-pay

## 2022-08-20 ENCOUNTER — Other Ambulatory Visit: Payer: Self-pay | Admitting: Nurse Practitioner

## 2022-08-20 DIAGNOSIS — Z1231 Encounter for screening mammogram for malignant neoplasm of breast: Secondary | ICD-10-CM

## 2022-09-22 ENCOUNTER — Ambulatory Visit
Admission: RE | Admit: 2022-09-22 | Discharge: 2022-09-22 | Disposition: A | Discharge status: Home / Self Care | Payer: Medicare Other | Source: Ambulatory Visit | Attending: Nurse Practitioner | Admitting: Nurse Practitioner

## 2022-09-22 ENCOUNTER — Other Ambulatory Visit: Payer: Self-pay | Admitting: Nurse Practitioner

## 2022-09-22 DIAGNOSIS — Z1231 Encounter for screening mammogram for malignant neoplasm of breast: Secondary | ICD-10-CM

## 2022-10-07 ENCOUNTER — Ambulatory Visit (LOCAL_COMMUNITY_HEALTH_CENTER): Payer: Medicare Other

## 2022-10-07 DIAGNOSIS — Z719 Counseling, unspecified: Secondary | ICD-10-CM

## 2022-10-07 DIAGNOSIS — Z23 Encounter for immunization: Secondary | ICD-10-CM

## 2022-10-07 NOTE — Progress Notes (Signed)
  Are you feeling sick today? No   Have you ever received a dose of COVID-19 Vaccine? [employer], [place], [place], [place], Other) Yes  If yes, which vaccine and how many doses?   4 doses Pfizer   Did you bring the vaccination record card or other documentation?  Yes   Do you have a health condition or are undergoing treatment that makes you moderately or severely immunocompromised? This would include, but not be limited to: cancer, HIV, organ transplant, immunosuppressive therapy/high-dose corticosteroids, or moderate/severe primary immunodeficiency.  No  Have you received COVID-19 vaccine before or during hematopoietic cell transplant (HCT) or CAR-T-cell therapies? No  Have you ever had an allergic reaction to: (This would include a severe allergic reaction or a reaction that caused hives, swelling, or respiratory distress, including wheezing.) A component of a COVID-19 vaccine or a previous dose of COVID-19 vaccine? No   Have you ever had an allergic reaction to another vaccine (other thanCOVID-19 vaccine) or an injectable medication? (This would include a severe allergic reaction or a reaction that caused hives, swelling, or respiratory distress, including wheezing.)   No    Do you have a history of any of the following:  Myocarditis or Pericarditis No  Dermal fillers:  No  Multisystem Inflammatory Syndrome (MIS-C or MIS-A)? No  COVID-19 disease within the past 3 months? No  Vaccinated with monkeypox vaccine in the last 4 weeks? No  VIS provided. Comirnaty +12Y 2023-24 IM in left deltoid.  Tolerated well. Waited 15 minutes.  [facility] copy provided and COVID card updated.

## 2022-10-16 ENCOUNTER — Ambulatory Visit: Payer: Medicare Other | Admitting: Podiatry

## 2022-10-16 ENCOUNTER — Encounter: Payer: Self-pay | Admitting: Podiatry

## 2022-10-16 DIAGNOSIS — M79675 Pain in left toe(s): Secondary | ICD-10-CM | POA: Diagnosis not present

## 2022-10-16 DIAGNOSIS — M79674 Pain in right toe(s): Secondary | ICD-10-CM | POA: Diagnosis not present

## 2022-10-16 DIAGNOSIS — B351 Tinea unguium: Secondary | ICD-10-CM | POA: Diagnosis not present

## 2022-10-16 DIAGNOSIS — E119 Type 2 diabetes mellitus without complications: Secondary | ICD-10-CM | POA: Diagnosis not present

## 2022-10-16 DIAGNOSIS — I739 Peripheral vascular disease, unspecified: Secondary | ICD-10-CM | POA: Diagnosis not present

## 2022-10-16 DIAGNOSIS — M216X1 Other acquired deformities of right foot: Secondary | ICD-10-CM

## 2022-10-16 DIAGNOSIS — L84 Corns and callosities: Secondary | ICD-10-CM

## 2022-10-16 NOTE — Progress Notes (Signed)
This patient presents  to my office for at risk foot care.  This patient requires this care by a professional since this patient will be at risk due to having type 2 diabetes and PAD.  Marland Kitchen  This patient is unable to cut nails callus  since the patient cannot reach her calluses..These calluses are painful walking and wearing shoes.  She is also having ingrown toenails both big toes.She also has painful callus on the outside of balls of her feet and corn 4th toe right foot.  This patient presents for at risk foot care today.  General Appearance  Alert, conversant and in no acute stress.  Vascular  Dorsalis pedis and posterior tibial  pulses are  weakly palpable  bilaterally.  Capillary return is within normal limits  bilaterally. Temperature is within normal limits  bilaterally.  Neurologic  Senn-Weinstein monofilament wire test within normal limits  bilaterally. Muscle power within normal limits bilaterally.  Nails Normal nails  Bilateral.. No evidence of bacterial infection or drainage bilaterally. Thick ingrown toenails both borders hallux  B/l.  Orthopedic  No limitations of motion  feet .  No crepitus or effusions noted.  No bony pathology .  Hammer toes 2-5  B/L. HAV  right foot.  Skin  normotropic skin  noted bilaterally.  No signs of infections or ulcers noted.   Callus sub 5th  B/L.     Callus secondary to plantar flexed metatarsals  B/L.  Clavi  B/L  Onychomycosis  Hallux  B/l.  Consent was obtained for treatment procedures.   Debridement of callus with # 15 blade followed by dremel tool usage.  Debride hallux  B/L. Debride callus sub 5  B/L.  Return office visit  3 months                    Told patient to return for periodic foot care and evaluation due to potential at risk complications.   Gardiner Barefoot DPM

## 2022-11-06 ENCOUNTER — Ambulatory Visit: Payer: Medicare Other

## 2022-11-10 ENCOUNTER — Other Ambulatory Visit: Payer: Self-pay

## 2022-11-10 ENCOUNTER — Encounter: Payer: Self-pay | Admitting: Family Medicine

## 2022-11-10 ENCOUNTER — Ambulatory Visit (INDEPENDENT_AMBULATORY_CARE_PROVIDER_SITE_OTHER): Payer: Medicare Other | Admitting: Family Medicine

## 2022-11-10 VITALS — BP 126/74 | HR 77 | Temp 97.8°F | Resp 16 | Ht 64.0 in | Wt 113.0 lb

## 2022-11-10 DIAGNOSIS — Z0000 Encounter for general adult medical examination without abnormal findings: Secondary | ICD-10-CM | POA: Diagnosis not present

## 2022-11-10 DIAGNOSIS — Z23 Encounter for immunization: Secondary | ICD-10-CM

## 2022-11-10 DIAGNOSIS — M8580 Other specified disorders of bone density and structure, unspecified site: Secondary | ICD-10-CM

## 2022-11-10 NOTE — Patient Instructions (Signed)
It was great to see you!  Our plans for today:  - Get your tetanus vaccine at the pharmacy. - We gave your pneumonia shot today.  - Someone will call you to schedule your bone density scan.  Take care and seek immediate care sooner if you develop any concerns.   Dr. Ky Barban

## 2022-11-10 NOTE — Progress Notes (Signed)
Annual Wellness Visit  Patient: Rachael Castro, Female    DOB: Jan 02, 1931, 89 y.o.   MRN: [id number]  Subjective  Chief Complaint  Patient presents with   Medicare Wellness    Rachael Castro is a 89 y.o. female who presents today for her Annual Wellness Visit. She reports consuming a general diet. Home exercise routine includes walking ~0.5 mile every day. She generally feels well. She reports sleeping well. She does not have additional problems to discuss today.    Vision:Within last year and Dental: No current dental problems and Receives regular dental care   Patient Active Problem List   Diagnosis Date Noted   Unintentional weight loss has improved 03/07/2022   Pain due to onychomycosis of toenails of both feet 09/05/2021   PAD (peripheral artery disease) ([place]) 03/29/2021   Callus 02/21/2021   Plantar flexed metatarsal bone of left foot 02/21/2021   Plantar flexed metatarsal bone of right foot 02/21/2021   Clavi 02/21/2021   Callus of foot 01/25/2021   Restless leg syndrome 01/25/2021   Gastroesophageal reflux disease 01/25/2021   Normocytic anemia 10/18/2020   Impingement syndrome of shoulder region 05/16/2019   Osteopenia 12/24/2017   Hypomagnesemia 09/01/2016   Gastrointestinal stromal tumor (GIST) ([place]) 08/09/2016   Mixed hyperlipidemia 10/23/2015   Type 2 diabetes mellitus without complication, without long-term current use of insulin ([place]) 10/23/2015   Essential hypertension 06/18/2015   H/O malignant gastrointestinal stromal tumor (GIST) 05/30/2015   Postherpetic neuralgia 04/17/2015   DDD (degenerative disc disease), lumbar 04/17/2015   Personal history of malignant neoplasm of breast 07/04/2013   History of colonic polyps    Past Surgical History:  Procedure Laterality Date   ABDOMINAL HYSTERECTOMY     APPENDECTOMY     BREAST BIOPSY Left 09/08/2016   Done by Dr. Jamal Collin   BREAST EXCISIONAL BIOPSY Right 1999   Mastectomy   BREAST SURGERY Right  1999   mastectomy   CATARACT EXTRACTION W/PHACO Left 04/30/2015   Procedure: CATARACT EXTRACTION PHACO AND INTRAOCULAR LENS PLACEMENT ([place]);  Surgeon: Estill Cotta, MD;  Location: [facility] ORS;  Service: Ophthalmology;  Laterality: Left;  [place] 01:39 AP% 23.3 CDE 40.42 fluid pack lot # [id number]   COLONOSCOPY  2008   Sankar   EUS  02/12/2012   Procedure: UPPER ENDOSCOPIC ULTRASOUND (EUS) LINEAR;  Surgeon: Milus Banister, MD;  Location: [facility];  Service: Endoscopy;  Laterality: N/A;  radial linear   EYE SURGERY Right 2013   cataract   HERNIA REPAIR  2012   LAPAROTOMY  2013   excision of gastric wall mass    MASTECTOMY     SALPINGOOPHORECTOMY Right 1979   THYROIDECTOMY  1978   TONSILLECTOMY     TUBAL LIGATION     UPPER GI ENDOSCOPY  2013   Social History   Tobacco Use   Smoking status: Never   Smokeless tobacco: Never  Vaping Use   Vaping Use: Never used  Substance Use Topics   Alcohol use: No   Drug use: No      Medications: Outpatient Medications Prior to Visit  Medication Sig   atorvastatin (LIPITOR) 80 MG tablet TAKE 1 TABLET BY MOUTH EVERYDAY AT BEDTIME   calcium citrate-vitamin D 200-200 MG-UNIT TABS Take 1 tablet by mouth daily.   gabapentin (NEURONTIN) 100 MG capsule Take 2 capsules (200 mg total) by mouth at bedtime.   loratadine (CLARITIN) 10 MG tablet TAKE 1 TABLET BY MOUTH EVERYDAY AT BEDTIME   losartan (COZAAR) 25  MG tablet Take 1 tablet (25 mg total) by mouth daily.   Magnesium 100 MG CAPS Take 1 capsule by mouth daily.   Multiple Vitamin (MULTIVITAMIN) tablet Take 1 tablet by mouth daily.   omeprazole (PRILOSEC) 20 MG capsule Take 1 capsule (20 mg total) by mouth daily as needed.   sitaGLIPtin-metformin (JANUMET) 50-500 MG tablet Take 1 tablet by mouth daily.   thiamine (VITAMIN B-1) 100 MG tablet Take 100 mg by mouth daily.   No facility-administered medications prior to visit.    Allergies  Allergen Reactions   Fish Allergy Rash    Rash and  vomiting    Patient Care Team: Steele Sizer, MD as PCP - General (Family Medicine) Gardiner Barefoot, DPM as Consulting Physician (Podiatry) Algernon Huxley, MD as Referring Physician (Vascular Surgery)       Objective  BP 126/74   Pulse 77   Temp 97.8 F (36.6 C) (Oral)   Resp 16   Ht '5\' 4"'$  (1.626 m)   Wt 113 lb (51.3 kg)   SpO2 99%   BMI 19.40 kg/m  BP Readings from Last 3 Encounters:  11/10/22 126/74  07/22/22 130/84  03/25/22 124/74   Wt Readings from Last 3 Encounters:  11/10/22 113 lb (51.3 kg)  07/22/22 105 lb 14.4 oz (48 kg)  03/25/22 109 lb 11.2 oz (49.8 kg)      Physical Exam Gen: well appearing, in NAD Card: Reg rate Lungs: Comfortable WOB on RA Ext: WWP, no edema  Most recent functional status assessment:    11/10/2022    9:52 AM  In your present state of health, do you have any difficulty performing the following activities:  Hearing? 0  Vision? 0  Difficulty concentrating or making decisions? 0  Walking or climbing stairs? 0  Dressing or bathing? 0  Doing errands, shopping? 0   Most recent fall risk assessment:    11/10/2022    9:51 AM  Fall Risk   Falls in the past year? 0  Number falls in past yr: 0  Injury with Fall? 0  Follow up Falls evaluation completed    Most recent depression screenings:    11/10/2022    9:52 AM 07/22/2022    2:14 PM  PHQ 2/9 Scores  PHQ - 2 Score 0 3  PHQ- 9 Score  4   Most recent cognitive screening:    11/10/2022    9:52 AM  6CIT Screen  What Year? 0 points  What month? 0 points  What time? 0 points  Count back from 20 0 points  Months in reverse 0 points  Repeat phrase 0 points  Total Score 0 points   Most recent Audit-C alcohol use screening    11/10/2022    9:54 AM  Alcohol Use Disorder Test (AUDIT)  1. How often do you have a drink containing alcohol? 0  2. How many drinks containing alcohol do you have on a typical day when you are drinking? 0  3. How often do you have six or more drinks  on one occasion? 0  AUDIT-C Score 0   A score of 3 or more in women, and 4 or more in men indicates increased risk for alcohol abuse, EXCEPT if all of the points are from question 1   Vision/Hearing Screen: No results found.  Last CBC Lab Results  Component Value Date   WBC 6.9 07/22/2022   HGB 11.4 (L) 07/22/2022   HCT 33.8 (L) 07/22/2022   MCV 96.3  07/22/2022   MCH 32.5 07/22/2022   RDW 12.4 07/22/2022   PLT 258 19/50/9326   Last metabolic panel Lab Results  Component Value Date   GLUCOSE 138 07/22/2022   NA 140 07/22/2022   K 3.8 07/22/2022   CL 103 07/22/2022   CO2 26 07/22/2022   BUN 14 07/22/2022   CREATININE 0.75 07/22/2022   EGFR 77 07/22/2022   CALCIUM 9.8 07/22/2022   PHOS 3.4 06/04/2016   PROT 7.1 07/22/2022   ALBUMIN 4.1 03/29/2020   LABGLOB 2.4 06/18/2015   AGRATIO 1.9 06/18/2015   BILITOT 0.4 07/22/2022   ALKPHOS 59 03/29/2020   AST 17 07/22/2022   ALT 11 07/22/2022   ANIONGAP 9 03/29/2020   Last lipids Lab Results  Component Value Date   CHOL 209 (H) 07/22/2022   HDL 61 07/22/2022   LDLCALC 126 (H) 07/22/2022   TRIG 109 07/22/2022   CHOLHDL 3.4 07/22/2022   Last hemoglobin A1c Lab Results  Component Value Date   HGBA1C 6.6 (H) 07/22/2022      No results found for any visits on 11/10/22.    Assessment & Plan   Annual wellness visit done today including the all of the following: Reviewed patient's Family Medical History Reviewed and updated list of patient's medical providers Assessment of cognitive impairment was done Assessed patient's functional ability Established a written schedule for health screening [place] Completed and Reviewed  Exercise Activities and Dietary recommendations  Goals      DIET - INCREASE WATER INTAKE     Recommend drinking 6-8 glasses of water per day      Increase physical activity     Increase physical activity to 90 minutes per week.     Prevent falls         Immunization History  Administered Date(s) Administered   COVID-19, mRNA, vaccine(Comirnaty)12 years and older 10/07/2022   Fluad Quad(high Dose 65+) 06/10/2019, 07/12/2020, 07/22/2022   Influenza, High Dose Seasonal PF 06/18/2015, 09/04/2016   Influenza-Unspecified 07/23/2017, 06/02/2018   PFIZER(Purple Top)SARS-COV-2 Vaccination 11/14/2019, 12/05/2019, 07/16/2020, 03/04/2021   PNEUMOCOCCAL CONJUGATE-20 11/10/2022   Pfizer Covid-19 Vaccine Bivalent Booster 28yr & up 08/02/2021   Zoster Recombinat (Shingrix) 01/05/2019, 02/22/2020    Health Maintenance  Topic Date Due   DTaP/Tdap/Td (1 - Tdap) Never done   HEMOGLOBIN A1C  01/21/2023   FOOT EXAM  03/21/2023   OPHTHALMOLOGY EXAM  07/24/2023   MAMMOGRAM  09/23/2023   Medicare Annual Wellness (AWV)  11/11/2023   DEXA SCAN  11/10/2024   Pneumonia Vaccine 89 Years old  Completed   INFLUENZA VACCINE  Completed   COVID-19 Vaccine  Completed   Zoster Vaccines- Shingrix  Completed   HPV VACCINES  Aged Out   COLONOSCOPY (Pts 45-412yrInsurance coverage will need to be confirmed)  Discontinued   Advanced Care Planning: A voluntary discussion about advance care planning including the explanation and discussion of advance directives.  Discussed health care proxy and Living will, and the patient was able to identify a health care proxy as daughter, ShMarvene Staff Patient does have a living will at present time. If patient does have living will, I have requested they bring this to the clinic to be scanned in to their chart.  Discussed health benefits of physical activity, and encouraged her to engage in regular exercise appropriate for her age and condition.    Problem List Items Addressed This Visit       Musculoskeletal and Integument   Osteopenia  Relevant Orders   HM DEXA SCAN (Completed)   Other Visit Diagnoses     Encounter for Medicare annual wellness exam    -  Primary   Need for pneumococcal 20-valent conjugate  vaccination       Relevant Orders   Pneumococcal conjugate vaccine 20-valent (Prevnar 20) (Completed)       Return in about 1 year (around 11/11/2023) for awv.     Myles Gip, DO

## 2022-11-20 NOTE — Progress Notes (Signed)
Name: Rachael Castro   MRN: [id number]    DOB: Nov 26, 1930   Date:11/21/2022       Progress Note  Subjective  Chief Complaint  Follow Up  HPI  Lower extremity neuropathy: she takes B12 , but not sure  if level was every low , she also has DM and takes gabapentin. She states symptoms started about two years ago, she also has RLS and needs to move her legs when she goes to bed. Sometimes also has cramps and needs to get up and move. Discussed getting ferritin level and also starting her on Requip but she would like to hold off on adding medications at this time   PAD: she was seen by Dr. Lucky Cowboy 10/2021, studies showed mild disease, she has mild symptoms and given reassurance and continue current medications  DM type II with dyslipidemia, PAD, and neuropathy: she is currently taking Janumet and A1C has gone up a little to 7 %, still at goal . She states no po.lyphagia, polydipsia or polyuria.  She takes gabapentin . She sees podiatrist for callus on her feet Continue statin therapy but stop ARB since BP is towards low end of normal and she is 89 yo   History of breast cancer - right side in 1999: mammogram is up to date  History of gastric stromal tumor in 2013, last visit with Dr. Mike Gip was in 2021, per note she was supposed to go back for imaging and follow up in 6 months but patients states she thought she had been released from her care. She takes PPI for GERD and had some lack of appetite due to losing her husband in October, weight is stable now.   Osteopenia: last bone density was 2021, she is due for repeat, advised her to contact [facility]   Patient Active Problem List   Diagnosis Date Noted   Unintentional weight loss has improved 03/07/2022   Pain due to onychomycosis of toenails of both feet 09/05/2021   PAD (peripheral artery disease) ([place]) 03/29/2021   Callus 02/21/2021   Plantar flexed metatarsal bone of left foot 02/21/2021   Plantar flexed metatarsal bone of  right foot 02/21/2021   Clavi 02/21/2021   Callus of foot 01/25/2021   Restless leg syndrome 01/25/2021   Gastroesophageal reflux disease 01/25/2021   Normocytic anemia 10/18/2020   Impingement syndrome of shoulder region 05/16/2019   Osteopenia 12/24/2017   Hypomagnesemia 09/01/2016   Gastrointestinal stromal tumor (GIST) ([place]) 08/09/2016   Mixed hyperlipidemia 10/23/2015   Type 2 diabetes mellitus without complication, without long-term current use of insulin ([place]) 10/23/2015   Essential hypertension 06/18/2015   H/O malignant gastrointestinal stromal tumor (GIST) 05/30/2015   Postherpetic neuralgia 04/17/2015   DDD (degenerative disc disease), lumbar 04/17/2015   Personal history of malignant neoplasm of breast 07/04/2013   History of colonic polyps     Past Surgical History:  Procedure Laterality Date   ABDOMINAL HYSTERECTOMY     APPENDECTOMY     BREAST BIOPSY Left 09/08/2016   Done by Dr. Jamal Collin   BREAST EXCISIONAL BIOPSY Right 1999   Mastectomy   BREAST SURGERY Right 1999   mastectomy   CATARACT EXTRACTION W/PHACO Left 04/30/2015   Procedure: CATARACT EXTRACTION PHACO AND INTRAOCULAR LENS PLACEMENT ([place]);  Surgeon: Estill Cotta, MD;  Location: [facility] ORS;  Service: Ophthalmology;  Laterality: Left;  [place] 01:39 AP% 23.3 CDE 40.42 fluid pack lot # [id number]   COLONOSCOPY  2008   Sankar   EUS  02/12/2012  Procedure: UPPER ENDOSCOPIC ULTRASOUND (EUS) LINEAR;  Surgeon: Milus Banister, MD;  Location: [facility];  Service: Endoscopy;  Laterality: N/A;  radial linear   EYE SURGERY Right 2013   cataract   HERNIA REPAIR  2012   LAPAROTOMY  2013   excision of gastric wall mass    MASTECTOMY     SALPINGOOPHORECTOMY Right 1979   THYROIDECTOMY  1978   TONSILLECTOMY     TUBAL LIGATION     UPPER GI ENDOSCOPY  2013    Family History  Problem Relation Age of Onset   Cancer Mother        cervical   Cancer Father        prostate   Kidney Stones Brother    Glaucoma  Brother    Prostatitis Brother    Diabetes Sister    Thyroid disease Brother    Cancer Brother        thyroid   Arthritis Brother    Diabetes Sister    Kidney disease Neg Hx    Bladder Cancer Neg Hx    Breast cancer Neg Hx     Social History   Tobacco Use   Smoking status: Never   Smokeless tobacco: Never  Substance Use Topics   Alcohol use: No     Current Outpatient Medications:    atorvastatin (LIPITOR) 80 MG tablet, TAKE 1 TABLET BY MOUTH EVERYDAY AT BEDTIME, Disp: 90 tablet, Rfl: 3   calcium citrate-vitamin D 200-200 MG-UNIT TABS, Take 1 tablet by mouth daily., Disp: , Rfl:    gabapentin (NEURONTIN) 100 MG capsule, Take 2 capsules (200 mg total) by mouth at bedtime., Disp: 90 capsule, Rfl: 3   loratadine (CLARITIN) 10 MG tablet, TAKE 1 TABLET BY MOUTH EVERYDAY AT BEDTIME, Disp: 90 tablet, Rfl: 3   losartan (COZAAR) 25 MG tablet, Take 1 tablet (25 mg total) by mouth daily., Disp: 90 tablet, Rfl: 3   Magnesium 100 MG CAPS, Take 1 capsule by mouth daily., Disp: , Rfl:    Multiple Vitamin (MULTIVITAMIN) tablet, Take 1 tablet by mouth daily., Disp: , Rfl:    omeprazole (PRILOSEC) 20 MG capsule, Take 1 capsule (20 mg total) by mouth daily as needed., Disp: 90 capsule, Rfl: 1   sitaGLIPtin-metformin (JANUMET) 50-500 MG tablet, Take 1 tablet by mouth daily., Disp: 90 tablet, Rfl: 3   thiamine (VITAMIN B-1) 100 MG tablet, Take 100 mg by mouth daily., Disp: , Rfl:   Allergies  Allergen Reactions   Fish Allergy Rash    Rash and vomiting    I personally reviewed active problem list, medication list, allergies, family history, social history, health maintenance with the patient/caregiver today.   ROS  .Ten systems reviewed and is negative except as mentioned in HPI   Objective  Vitals:   11/21/22 1321  BP: 124/68  Pulse: 89  Resp: 16  SpO2: 97%  Weight: 110 lb (49.9 kg)  Height: '5\' 4"'$  (1.626 m)    Body mass index is 18.88 kg/m.  Physical Exam  Constitutional:  Patient appears well-developed and well-nourished.  No distress.  HEENT: head atraumatic, normocephalic, pupils equal and reactive to light, neck supple, but some pain with when rotating to the left side  Cardiovascular: Normal rate, regular rhythm and normal heart sounds.  No murmur heard. No BLE edema. Pulmonary/Chest: Effort normal and breath sounds normal. No respiratory distress. Abdominal: Soft.  There is no tenderness. Psychiatric: Patient has a normal mood and affect. behavior is normal. Judgment and thought  content normal.   Recent Results (from the past 2160 hour(s))  POCT HgB A1C     Status: Abnormal   Collection Time: 11/21/22  1:24 PM  Result Value Ref Range   Hemoglobin A1C 7.0 (A) 4.0 - 5.6 %   HbA1c POC (<> result, manual entry)     HbA1c, POC (prediabetic range)     HbA1c, POC (controlled diabetic range)       PHQ2/9:    11/21/2022    1:22 PM 11/10/2022    9:52 AM 07/22/2022    2:14 PM 12/09/2021    1:16 PM 11/05/2021   10:12 AM  Depression screen PHQ 2/9  Decreased Interest 0 0 2 0 1  Down, Depressed, Hopeless 0 0 1 0 1  PHQ - 2 Score 0 0 3 0 2  Altered sleeping 0  0  0  Tired, decreased energy 0  0  0  Change in appetite 1  1  0  Feeling bad or failure about yourself  0  0  0  Trouble concentrating 0  0  0  Moving slowly or fidgety/restless 0  0  0  Suicidal thoughts 0  0  0  PHQ-9 Score '1  4  2  '$ Difficult doing work/chores   Not difficult at all  Not difficult at all    phq 9 is negative   Fall Risk:    11/21/2022    1:22 PM 11/10/2022    9:51 AM 07/22/2022    2:14 PM 03/25/2022    1:27 PM 12/09/2021    1:16 PM  Fall Risk   Falls in the past year? 0 0 1 0 0  Number falls in past yr: 0 0 0 0 0  Injury with Fall? 0 0 0 0 0  Risk for fall due to : No Fall Risks  History of fall(s)    Follow up Falls prevention discussed Falls evaluation completed Falls evaluation completed Falls evaluation completed Falls evaluation completed      Functional Status  Survey: Is the patient deaf or have difficulty hearing?: Yes Does the patient have difficulty seeing, even when wearing glasses/contacts?: No Does the patient have difficulty concentrating, remembering, or making decisions?: Yes Does the patient have difficulty walking or climbing stairs?: No Does the patient have difficulty dressing or bathing?: No Does the patient have difficulty doing errands alone such as visiting a doctor's office or shopping?: No    Assessment & Plan  1. Type 2 diabetes mellitus without complication, without long-term current use of insulin (HCC)  - POCT HgB A1C  2. PAD (peripheral artery disease) (HCC)  Mild disease, on statin therapy   3. Gastroesophageal reflux disease without esophagitis  On PPI   4. Essential hypertension  BP is towards low end of normal, advised to stop taking it due to her age   26. Neck muscle spasm  Discussed possible side effects , may also use topical medications  - baclofen 5 MG TABS; Take 1-2 tablets (5-10 mg total) by mouth at bedtime as needed for muscle spasms.  Dispense: 10 tablet; Refill: 0  6. Restless leg syndrome  She does not want to start new medications   7. Osteopenia of neck of left femur  She will call for repeat bone density   8. Personal history of malignant neoplasm of breast  - Ambulatory referral to Hematology / Oncology  9. History of gastrointestinal stromal tumor (GIST)  - Ambulatory referral to Hematology / Oncology  10.  Paresthesia of bilateral legs  - B12 and Folate Panel  11. RLS (restless legs syndrome)  - Ferritin

## 2022-11-21 ENCOUNTER — Encounter: Payer: Self-pay | Admitting: Family Medicine

## 2022-11-21 ENCOUNTER — Ambulatory Visit: Payer: Medicare Other | Admitting: Family Medicine

## 2022-11-21 VITALS — BP 124/68 | HR 89 | Resp 16 | Ht 64.0 in | Wt 110.0 lb

## 2022-11-21 DIAGNOSIS — M85852 Other specified disorders of bone density and structure, left thigh: Secondary | ICD-10-CM

## 2022-11-21 DIAGNOSIS — Z853 Personal history of malignant neoplasm of breast: Secondary | ICD-10-CM

## 2022-11-21 DIAGNOSIS — R202 Paresthesia of skin: Secondary | ICD-10-CM

## 2022-11-21 DIAGNOSIS — K219 Gastro-esophageal reflux disease without esophagitis: Secondary | ICD-10-CM | POA: Diagnosis not present

## 2022-11-21 DIAGNOSIS — I10 Essential (primary) hypertension: Secondary | ICD-10-CM | POA: Diagnosis not present

## 2022-11-21 DIAGNOSIS — I739 Peripheral vascular disease, unspecified: Secondary | ICD-10-CM

## 2022-11-21 DIAGNOSIS — E119 Type 2 diabetes mellitus without complications: Secondary | ICD-10-CM

## 2022-11-21 DIAGNOSIS — G2581 Restless legs syndrome: Secondary | ICD-10-CM

## 2022-11-21 DIAGNOSIS — Z8509 Personal history of malignant neoplasm of other digestive organs: Secondary | ICD-10-CM

## 2022-11-21 DIAGNOSIS — M62838 Other muscle spasm: Secondary | ICD-10-CM

## 2022-11-21 LAB — POCT GLYCOSYLATED HEMOGLOBIN (HGB A1C): Hemoglobin A1C: 7 % — AB (ref 4.0–5.6)

## 2022-11-21 MED ORDER — BACLOFEN 5 MG PO TABS: 5.0000 mg | ORAL_TABLET | Freq: Every evening | ORAL | 0 refills | Status: DC | PRN

## 2022-11-22 LAB — B12 AND FOLATE PANEL
Folate: >24 ng/mL
Vitamin B-12: 775 pg/mL (ref 200–1100)

## 2022-11-22 LAB — FERRITIN: Ferritin: 71 ng/mL (ref 16–288)

## 2022-12-03 ENCOUNTER — Other Ambulatory Visit: Payer: Self-pay | Admitting: Family Medicine

## 2022-12-05 ENCOUNTER — Inpatient Hospital Stay: Payer: Medicare Other

## 2022-12-05 ENCOUNTER — Encounter: Payer: Self-pay | Admitting: Oncology

## 2022-12-05 ENCOUNTER — Ambulatory Visit: Payer: Medicare Other

## 2022-12-05 ENCOUNTER — Inpatient Hospital Stay: Payer: Medicare Other | Attending: Oncology | Admitting: Oncology

## 2022-12-05 VITALS — BP 142/74

## 2022-12-05 VITALS — BP 159/73 | HR 82 | Temp 96.0°F | Resp 16 | Ht 64.0 in | Wt 113.2 lb

## 2022-12-05 DIAGNOSIS — Z853 Personal history of malignant neoplasm of breast: Secondary | ICD-10-CM

## 2022-12-05 DIAGNOSIS — Z8500 Personal history of malignant neoplasm of unspecified digestive organ: Secondary | ICD-10-CM | POA: Insufficient documentation

## 2022-12-05 DIAGNOSIS — D649 Anemia, unspecified: Secondary | ICD-10-CM | POA: Insufficient documentation

## 2022-12-05 DIAGNOSIS — Z08 Encounter for follow-up examination after completed treatment for malignant neoplasm: Secondary | ICD-10-CM | POA: Insufficient documentation

## 2022-12-05 DIAGNOSIS — Z8509 Personal history of malignant neoplasm of other digestive organs: Secondary | ICD-10-CM

## 2022-12-05 DIAGNOSIS — I10 Essential (primary) hypertension: Secondary | ICD-10-CM

## 2022-12-05 LAB — CBC WITH DIFFERENTIAL/PLATELET
Abs Immature Granulocytes: 0.02 10*3/uL (ref 0.00–0.07)
Basophils Absolute: 0 10*3/uL (ref 0.0–0.1)
Basophils Relative: 0 %
Eosinophils Absolute: 0.1 10*3/uL (ref 0.0–0.5)
Eosinophils Relative: 1 %
HCT: 35.5 % — ABNORMAL LOW (ref 36.0–46.0)
Hemoglobin: 11.6 g/dL — ABNORMAL LOW (ref 12.0–15.0)
Immature Granulocytes: 0 %
Lymphocytes Relative: 25 %
Lymphs Abs: 1.9 10*3/uL (ref 0.7–4.0)
MCH: 31.9 pg (ref 26.0–34.0)
MCHC: 32.7 g/dL (ref 30.0–36.0)
MCV: 97.5 fL (ref 80.0–100.0)
Monocytes Absolute: 0.4 10*3/uL (ref 0.1–1.0)
Monocytes Relative: 5 %
Neutro Abs: 5.4 10*3/uL (ref 1.7–7.7)
Neutrophils Relative %: 69 %
Platelets: 244 10*3/uL (ref 150–400)
RBC: 3.64 MIL/uL — ABNORMAL LOW (ref 3.87–5.11)
RDW: 13.8 % (ref 11.5–15.5)
WBC: 7.8 10*3/uL (ref 4.0–10.5)
nRBC: 0 % (ref 0.0–0.2)

## 2022-12-05 LAB — IRON AND TIBC
Iron: 67 ug/dL (ref 28–170)
Saturation Ratios: 20 % (ref 10.4–31.8)
TIBC: 339 ug/dL (ref 250–450)
UIBC: 272 ug/dL

## 2022-12-05 LAB — RETICULOCYTES
Immature Retic Fract: 10.5 % (ref 2.3–15.9)
RBC.: 3.65 MIL/uL — ABNORMAL LOW (ref 3.87–5.11)
Retic Count, Absolute: 65.3 10*3/uL (ref 19.0–186.0)
Retic Ct Pct: 1.8 % (ref 0.4–3.1)

## 2022-12-05 LAB — TSH: TSH: 1.142 u[IU]/mL (ref 0.350–4.500)

## 2022-12-05 NOTE — Progress Notes (Unsigned)
Hematology/Oncology Consult note [facility] [phone] [phone]  Patient Care Team: Steele Sizer, MD as PCP - General (Family Medicine) Gardiner Barefoot, DPM as Consulting Physician (Podiatry) Lucky Cowboy, Erskine Squibb, MD as Referring Physician (Vascular Surgery)   Name of the patient: Rachael Castro  [id number]  Aug 24, 1931    Reason for referral-history of breast cancer and anemia   Referring physician-Dr. Ancil Boozer  Date of visit: 12/05/22   History of presenting illness- Patient is a 89 year old female with a history of right breast cancer s/p modified radical mastectomy in 1999 followed by adjuvant AC chemotherapy.  She then took tamoxifen as well as Femara subsequently.  She also has a history of GIST tumor s/p resection in 2013.  Pathology showed a 6.4 cm tumor for which she received a year of [place].  She also has a history of normocytic anemia with a hemoglobin between 10-11.  She has now been referred to [place] for anemia.  CBC in October 2023 showed H&H of 11.4/33.8 with a white count of 6.9 and a platelet count of 258.  B12 and folate in February 2024 was normal.  Ferritin normal at 71.  Renal functions normal.Looking back at her prior labs patient's hemoglobin has been between 11-12 since 2018 without a clear downward trend.  ECOG PS- 1  Pain scale- 0   Review of systems- Review of Systems  Constitutional:  Positive for malaise/fatigue. Negative for chills, fever and weight loss.  HENT:  Negative for congestion, ear discharge and nosebleeds.   Eyes:  Negative for blurred vision.  Respiratory:  Negative for cough, hemoptysis, sputum production, shortness of breath and wheezing.   Cardiovascular:  Negative for chest pain, palpitations, orthopnea and claudication.  Gastrointestinal:  Negative for abdominal pain, blood in stool, constipation, diarrhea, heartburn, melena, nausea and vomiting.  Genitourinary:  Negative for dysuria, flank pain,  frequency, hematuria and urgency.  Musculoskeletal:  Negative for back pain, joint pain and myalgias.  Skin:  Negative for rash.  Neurological:  Negative for dizziness, tingling, focal weakness, seizures, weakness and headaches.  Endo/Heme/Allergies:  Does not bruise/bleed easily.  Psychiatric/Behavioral:  Negative for depression and suicidal ideas. The patient does not have insomnia.     Allergies  Allergen Reactions   Fish Allergy Rash    Rash and vomiting    Patient Active Problem List   Diagnosis Date Noted   Pain due to onychomycosis of toenails of both feet 09/05/2021   PAD (peripheral artery disease) ([place]) 03/29/2021   Callus 02/21/2021   Plantar flexed metatarsal bone of left foot 02/21/2021   Plantar flexed metatarsal bone of right foot 02/21/2021   Clavi 02/21/2021   Callus of foot 01/25/2021   Restless leg syndrome 01/25/2021   Gastroesophageal reflux disease 01/25/2021   Normocytic anemia 10/18/2020   Impingement syndrome of shoulder region 05/16/2019   Osteopenia 12/24/2017   Hypomagnesemia 09/01/2016   Gastrointestinal stromal tumor (GIST) ([place]) 08/09/2016   Mixed hyperlipidemia 10/23/2015   Type 2 diabetes mellitus without complication, without long-term current use of insulin ([place]) 10/23/2015   Essential hypertension 06/18/2015   H/O malignant gastrointestinal stromal tumor (GIST) 05/30/2015   Postherpetic neuralgia 04/17/2015   DDD (degenerative disc disease), lumbar 04/17/2015   Personal history of malignant neoplasm of breast 07/04/2013   History of colonic polyps      Past Medical History:  Diagnosis Date   Breast cancer ([place]) 1999   right breast   Diabetes mellitus    GERD (gastroesophageal reflux disease)  GIST (gastrointestinal stromal tumor), malignant ([place]) 2013   History of shingles    Hypercholesteremia    Hypertension    Kidney stones    Malignant neoplasm of other specified sites of stomach 2013   GIST   Osteopenia 12/24/2017    March 2019; next scan March 2021   Personal history of chemotherapy    Personal history of colonic polyps    Thyroid disease      Past Surgical History:  Procedure Laterality Date   ABDOMINAL HYSTERECTOMY     APPENDECTOMY     BREAST BIOPSY Left 09/08/2016   Done by Dr. Jamal Collin   BREAST EXCISIONAL BIOPSY Right 1999   Mastectomy   BREAST SURGERY Right 1999   mastectomy   CATARACT EXTRACTION W/PHACO Left 04/30/2015   Procedure: CATARACT EXTRACTION PHACO AND INTRAOCULAR LENS PLACEMENT ([place]);  Surgeon: Estill Cotta, MD;  Location: [facility] ORS;  Service: Ophthalmology;  Laterality: Left;  [place] 01:39 AP% 23.3 CDE 40.42 fluid pack lot # [id number]   COLONOSCOPY  2008   Sankar   EUS  02/12/2012   Procedure: UPPER ENDOSCOPIC ULTRASOUND (EUS) LINEAR;  Surgeon: Milus Banister, MD;  Location: [facility];  Service: Endoscopy;  Laterality: N/A;  radial linear   EYE SURGERY Right 2013   cataract   HERNIA REPAIR  2012   LAPAROTOMY  2013   excision of gastric wall mass    MASTECTOMY     SALPINGOOPHORECTOMY Right 1979   THYROIDECTOMY  1978   TONSILLECTOMY     TUBAL LIGATION     UPPER GI ENDOSCOPY  2013    Social History   Socioeconomic History   Marital status: Widowed    Spouse name: Not on file   Number of children: 1   Years of education: Not on file   Highest education level: High school graduate  Occupational History   Not on file  Tobacco Use   Smoking status: Never   Smokeless tobacco: Never  Vaping Use   Vaping Use: Never used  Substance and Sexual Activity   Alcohol use: No   Drug use: No   Sexual activity: Not Currently  Other Topics Concern   Not on file  Social History Narrative   Primary caregiver to husband who had a stroke in Aug 2021   11/11/22   Patient husband passed in October 2023. Lives alone. Daughter takes care of   Social Determinants of Health   Financial Resource Strain: Low Risk  (11/10/2022)   Overall Financial Resource Strain (CARDIA)     Difficulty of Paying Living Expenses: Not hard at all  Food Insecurity: No Food Insecurity (11/10/2022)   Hunger Vital Sign    Worried About Running Out of Food in the Last Year: Never true    Ran Out of Food in the Last Year: Never true  Transportation Needs: No Transportation Needs (11/10/2022)   PRAPARE - Hydrologist (Medical): No    Lack of Transportation (Non-Medical): No  Physical Activity: Insufficiently Active (11/10/2022)   Exercise Vital Sign    Days of Exercise per Week: 1 day    Minutes of Exercise per Session: 10 min  Stress: No Stress Concern Present (11/10/2022)   [place]    Feeling of Stress : Only a little  Social Connections: Moderately Integrated (11/10/2022)   Social Connection and Isolation Panel [NHANES]    Frequency of Communication with Friends and Family: More than three times  a week    Frequency of Social Gatherings with Friends and Family: More than three times a week    Attends Religious Services: More than 4 times per year    Active Member of Clubs or Organizations: Yes    Attends Archivist Meetings: 1 to 4 times per year    Marital Status: Widowed  Intimate Partner Violence: Not At Risk (11/10/2022)   Humiliation, Afraid, Rape, and Kick questionnaire    Fear of Current or Ex-Partner: No    Emotionally Abused: No    Physically Abused: No    Sexually Abused: No     Family History  Problem Relation Age of Onset   Cancer Mother        cervical   Cancer Father        prostate   Kidney Stones Brother    Glaucoma Brother    Prostatitis Brother    Diabetes Sister    Thyroid disease Brother    Cancer Brother        thyroid   Arthritis Brother    Diabetes Sister    Kidney disease Neg Hx    Bladder Cancer Neg Hx    Breast cancer Neg Hx      Current Outpatient Medications:    atorvastatin (LIPITOR) 80 MG tablet, TAKE 1 TABLET BY MOUTH EVERYDAY  AT BEDTIME, Disp: 90 tablet, Rfl: 3   baclofen 5 MG TABS, Take 1-2 tablets (5-10 mg total) by mouth at bedtime as needed for muscle spasms., Disp: 10 tablet, Rfl: 0   calcium citrate-vitamin D 200-200 MG-UNIT TABS, Take 1 tablet by mouth daily., Disp: , Rfl:    gabapentin (NEURONTIN) 100 MG capsule, Take 2 capsules (200 mg total) by mouth at bedtime., Disp: 90 capsule, Rfl: 3   loratadine (CLARITIN) 10 MG tablet, TAKE 1 TABLET BY MOUTH EVERYDAY AT BEDTIME, Disp: 90 tablet, Rfl: 3   Magnesium 100 MG CAPS, Take 1 capsule by mouth daily., Disp: , Rfl:    Multiple Vitamin (MULTIVITAMIN) tablet, Take 1 tablet by mouth daily., Disp: , Rfl:    omeprazole (PRILOSEC) 20 MG capsule, Take 1 capsule (20 mg total) by mouth daily as needed., Disp: 90 capsule, Rfl: 1   sitaGLIPtin-metformin (JANUMET) 50-500 MG tablet, Take 1 tablet by mouth daily., Disp: 90 tablet, Rfl: 3   thiamine (VITAMIN B-1) 100 MG tablet, Take 100 mg by mouth daily., Disp: , Rfl:    Physical exam: There were no vitals filed for this visit. Physical Exam Cardiovascular:     Rate and Rhythm: Normal rate and regular rhythm.     Heart sounds: Normal heart sounds.  Pulmonary:     Effort: Pulmonary effort is normal.     Breath sounds: Normal breath sounds.  Abdominal:     General: Bowel sounds are normal.     Palpations: Abdomen is soft.  Skin:    General: Skin is warm and dry.  Neurological:     Mental Status: She is alert and oriented to person, place, and time.    Breast exam: patient is s/p right mastectomy without reconstruction. No evidence of chest wall recurrence. No palpable b/l axillary adenopathy. No palpable masses in the left breast    Latest Ref Rng & Units 07/22/2022    2:48 PM  CMP  Glucose 65 - 139 mg/dL 138   BUN 7 - 25 mg/dL 14   Creatinine 0.60 - 0.95 mg/dL 0.75   Sodium 135 - 146 mmol/L 140   Potassium  3.5 - 5.3 mmol/L 3.8   Chloride 98 - 110 mmol/L 103   CO2 20 - 32 mmol/L 26   Calcium 8.6 - 10.4 mg/dL  9.8   Total Protein 6.1 - 8.1 g/dL 7.1   Total Bilirubin 0.2 - 1.2 mg/dL 0.4   AST 10 - 35 U/L 17   ALT 6 - 29 U/L 11     Assessment and plan- Patient is a 89 y.o. female who has been referred for following issues:  H/o right breast cancer- this was in 1999. Clinically she is doing well with no signs and symptoms of recurrence on todays exam. Recent left mammogram from dec 2023 was normal. She does not require any oncology f/u for this. She can continue left mammogram as long as she wants and has a good quality of life and 5 year life expectancy  2. H/o gastric GIST s/p resection- this is now 10 years since resection and I would not recommend surveillance scans for this. No oncology f/u needed  3. Normocytic anemia: hb stable around 11 over the years. Will do anemia work up today. Telephone visit next week   Thank you for this kind referral and the opportunity to participate in the care of this patient   Visit Diagnosis 1. History of breast cancer   2. History of gastrointestinal stromal tumor (GIST)   3. Normocytic anemia     Dr. Randa Evens, MD, MPH [facility] at [facility] [id number] 12/05/2022

## 2022-12-05 NOTE — Progress Notes (Signed)
Patient here for blood pressure check.  Last visit blood pressure med stopped due to lower end of normal and age.  Blood pressure today is 142/74.

## 2022-12-07 LAB — HAPTOGLOBIN: Haptoglobin: 197 mg/dL (ref 41–333)

## 2022-12-08 ENCOUNTER — Telehealth: Payer: Self-pay | Admitting: Family Medicine

## 2022-12-08 NOTE — Telephone Encounter (Unsigned)
Copied from [place] [id number]. Topic: General - Other >> Dec 08, 2022  9:40 AM Charlotte Sanes J wrote: Reason for CRM: Pt stated that Dr. Ancil Boozer Nurse called her this morning and she was able to catch the BP numbers during the call and asked to speak with the nurse / please advise/ pt goes out from 10am - 11am and will be available to cal after 11 am

## 2022-12-08 NOTE — Telephone Encounter (Signed)
Patient had called to clarify what bp number she should look out for to know when to take her medication. Currently she can stay off medication and we will monitor. However, per Dr. Ancil Boozer if her bp goes above 145/90 at home she is to take half a pill of Losartan 75m.   Patient gave verbal understanding.

## 2022-12-11 ENCOUNTER — Inpatient Hospital Stay (HOSPITAL_BASED_OUTPATIENT_CLINIC_OR_DEPARTMENT_OTHER): Payer: Medicare Other | Admitting: Oncology

## 2022-12-11 DIAGNOSIS — D649 Anemia, unspecified: Secondary | ICD-10-CM | POA: Diagnosis not present

## 2022-12-14 ENCOUNTER — Encounter: Payer: Self-pay | Admitting: Oncology

## 2022-12-14 NOTE — Progress Notes (Signed)
I connected with Rachael Castro on 12/14/22 at  9:30 AM EST by telephone visit and verified that I am speaking with the correct person using two identifiers.   I discussed the limitations, risks, security and privacy concerns of performing an evaluation and management service by telemedicine and the availability of in-person appointments. I also discussed with the patient that there may be a patient responsible charge related to this service. The patient expressed understanding and agreed to proceed.  Other persons participating in the visit and their role in the encounter:  none  Patient's location:  home Provider's location:  home  Chief Complaint:  discuss results of bloodwork  History of present illness: Patient is a 89 year old female with a history of right breast cancer s/p modified radical mastectomy in 1999 followed by adjuvant AC chemotherapy.  She then took tamoxifen as well as Femara subsequently.  She also has a history of GIST tumor s/p resection in 2013.  Pathology showed a 6.4 cm tumor for which she received a year of [place].  She also has a history of normocytic anemia with a hemoglobin between 10-11.  She has now been referred to [place] for anemia.   CBC in October 2023 showed H&H of 11.4/33.8 with a white count of 6.9 and a platelet count of 258.  B12 and folate in February 2024 was normal.  Ferritin normal at 71.  Renal functions normal.Looking back at her prior labs patient's hemoglobin has been between 11-12 since 2018 without a clear downward trend.  Interval history reports some fatigue which is chronic. Denies new complaints   Review of Systems  Constitutional:  Positive for malaise/fatigue. Negative for chills, fever and weight loss.  HENT:  Negative for congestion, ear discharge and nosebleeds.   Eyes:  Negative for blurred vision.  Respiratory:  Negative for cough, hemoptysis, sputum production, shortness of breath and wheezing.   Cardiovascular:  Negative for chest pain,  palpitations, orthopnea and claudication.  Gastrointestinal:  Negative for abdominal pain, blood in stool, constipation, diarrhea, heartburn, melena, nausea and vomiting.  Genitourinary:  Negative for dysuria, flank pain, frequency, hematuria and urgency.  Musculoskeletal:  Negative for back pain, joint pain and myalgias.  Skin:  Negative for rash.  Neurological:  Negative for dizziness, tingling, focal weakness, seizures, weakness and headaches.  Endo/Heme/Allergies:  Does not bruise/bleed easily.  Psychiatric/Behavioral:  Negative for depression and suicidal ideas. The patient does not have insomnia.     Allergies  Allergen Reactions   Fish Allergy Rash    Rash and vomiting    Past Medical History:  Diagnosis Date   Breast cancer ([place]) 1999   right breast   Diabetes mellitus    GERD (gastroesophageal reflux disease)    GIST (gastrointestinal stromal tumor), malignant ([place]) 2013   History of shingles    Hypercholesteremia    Hypertension    Kidney stones    Malignant neoplasm of other specified sites of stomach 2013   GIST   Osteopenia 12/24/2017   March 2019; next scan March 2021   Personal history of chemotherapy    Personal history of colonic polyps    Thyroid disease     Past Surgical History:  Procedure Laterality Date   ABDOMINAL HYSTERECTOMY     APPENDECTOMY     BREAST BIOPSY Left 09/08/2016   Done by Dr. Jamal Collin   BREAST EXCISIONAL BIOPSY Right 1999   Mastectomy   BREAST SURGERY Right 1999   mastectomy   CATARACT EXTRACTION [facility] Left 04/30/2015  Procedure: CATARACT EXTRACTION PHACO AND INTRAOCULAR LENS PLACEMENT ([facility]);  Surgeon: Estill Cotta, MD;  Location: [facility] ORS;  Service: Ophthalmology;  Laterality: Left;  [place] 01:39 AP% 23.3 CDE 40.42 fluid pack lot # [id number]   COLONOSCOPY  2008   Sankar   EUS  02/12/2012   Procedure: UPPER ENDOSCOPIC ULTRASOUND (EUS) LINEAR;  Surgeon: Milus Banister, MD;  [facility];  Service: Endoscopy;   Laterality: N/A;  radial linear   EYE SURGERY Right 2013   cataract   HERNIA REPAIR  2012   LAPAROTOMY  2013   excision of gastric wall mass    MASTECTOMY     SALPINGOOPHORECTOMY Right 1979   THYROIDECTOMY  1978   TONSILLECTOMY     TUBAL LIGATION     UPPER GI ENDOSCOPY  2013    Social History   Socioeconomic History   Marital status: Widowed    Spouse name: Not on file   Number of children: 1   Years of education: Not on file   Highest education level: High school graduate  Occupational History   Not on file  Tobacco Use   Smoking status: Never   Smokeless tobacco: Never  Vaping Use   Vaping Use: Never used  Substance and Sexual Activity   Alcohol use: No   Drug use: No   Sexual activity: Not Currently  Other Topics Concern   Not on file  Social History Narrative   Primary caregiver to husband who had a stroke in Aug 2021   11/11/22   Patient husband passed in October 2023. Lives alone. Daughter takes care of   Social Determinants of Health   Financial Resource Strain: Low Risk  (11/10/2022)   Overall Financial Resource Strain (CARDIA)    Difficulty of Paying Living Expenses: Not hard at all  Food Insecurity: No Food Insecurity (11/10/2022)   Hunger Vital Sign    Worried About Running Out of Food in the Last Year: Never true    Ran Out of Food in the Last Year: Never true  Transportation Needs: No Transportation Needs (11/10/2022)   PRAPARE - Hydrologist (Medical): No    Lack of Transportation (Non-Medical): No  Physical Activity: Insufficiently Active (11/10/2022)   Exercise Vital Sign    Days of Exercise per Week: 1 day    Minutes of Exercise per Session: 10 min  Stress: No Stress Concern Present (11/10/2022)   [place]    Feeling of Stress : Only a little  Social Connections: Moderately Integrated (11/10/2022)   Social Connection and Isolation Panel [NHANES]     Frequency of Communication with Friends and Family: More than three times a week    Frequency of Social Gatherings with Friends and Family: More than three times a week    Attends Religious Services: More than 4 times per year    Active Member of [employer] or Organizations: Yes    Attends Archivist Meetings: 1 to 4 times per year    Marital Status: Widowed  Intimate Partner Violence: Not At Risk (11/10/2022)   Humiliation, Afraid, Rape, and Kick questionnaire    Fear of Current or Ex-Partner: No    Emotionally Abused: No    Physically Abused: No    Sexually Abused: No    Family History  Problem Relation Age of Onset   Cancer Mother        cervical   Cancer Father  prostate   Kidney Stones Brother    Glaucoma Brother    Prostatitis Brother    Diabetes Sister    Thyroid disease Brother    Cancer Brother        thyroid   Arthritis Brother    Diabetes Sister    Kidney disease Neg Hx    Bladder Cancer Neg Hx    Breast cancer Neg Hx      Current Outpatient Medications:    atorvastatin (LIPITOR) 80 MG tablet, TAKE 1 TABLET BY MOUTH EVERYDAY AT BEDTIME, Disp: 90 tablet, Rfl: 3   baclofen 5 MG TABS, Take 1-2 tablets (5-10 mg total) by mouth at bedtime as needed for muscle spasms. (Patient not taking: Reported on 12/05/2022), Disp: 10 tablet, Rfl: 0   calcium citrate-vitamin D 200-200 MG-UNIT TABS, Take 1 tablet by mouth daily., Disp: , Rfl:    gabapentin (NEURONTIN) 100 MG capsule, Take 2 capsules (200 mg total) by mouth at bedtime., Disp: 90 capsule, Rfl: 3   loratadine (CLARITIN) 10 MG tablet, TAKE 1 TABLET BY MOUTH EVERYDAY AT BEDTIME, Disp: 90 tablet, Rfl: 3   losartan (COZAAR) 25 MG tablet, Take 25 mg by mouth daily. (Patient not taking: Reported on 12/05/2022), Disp: , Rfl:    Magnesium 100 MG CAPS, Take 1 capsule by mouth daily., Disp: , Rfl:    Multiple Vitamin (MULTIVITAMIN) tablet, Take 1 tablet by mouth daily., Disp: , Rfl:    omeprazole (PRILOSEC) 20 MG  capsule, Take 1 capsule (20 mg total) by mouth daily as needed., Disp: 90 capsule, Rfl: 1   sitaGLIPtin-metformin (JANUMET) 50-500 MG tablet, Take 1 tablet by mouth daily., Disp: 90 tablet, Rfl: 3   thiamine (VITAMIN B-1) 100 MG tablet, Take 100 mg by mouth daily., Disp: , Rfl:   No results found.  No images are attached to the encounter.      Latest Ref Rng & Units 07/22/2022    2:48 PM  CMP  Glucose 65 - 139 mg/dL 138   BUN 7 - 25 mg/dL 14   Creatinine 0.60 - 0.95 mg/dL 0.75   Sodium 135 - 146 mmol/L 140   Potassium 3.5 - 5.3 mmol/L 3.8   Chloride 98 - 110 mmol/L 103   CO2 20 - 32 mmol/L 26   Calcium 8.6 - 10.4 mg/dL 9.8   Total Protein 6.1 - 8.1 g/dL 7.1   Total Bilirubin 0.2 - 1.2 mg/dL 0.4   AST 10 - 35 U/L 17   ALT 6 - 29 U/L 11       Latest Ref Rng & Units 12/05/2022    3:16 PM  CBC  WBC 4.0 - 10.5 K/uL 7.8   Hemoglobin 12.0 - 15.0 g/dL 11.6   Hematocrit 36.0 - 46.0 % 35.5   Platelets 150 - 400 K/uL 244      Assessment and plan: Patient is a 89 year old female and this is her visit to discuss the results of normocytic anemia workup Discussed the results of blood work from 12/05/2022 which shows white count of 7.8, H&H of 11.6/35.5 and a platelet count of 244.  TSH was normal.  Iron studies showed normal iron saturation of 20%.  Adipocyte count normal.  Haptoglobin normal.  Ferritin levels normal at 71.  B12 and folate normal.  Her hemoglobin has been stable around 11 for the last 5 years with no consistent downward trend.  She does not require bone marrow biopsy at this time.  Her anemia is overall mild and can  be conservatively followed by her PCP.  Also her history of GIST has been 10 years ago and breast cancer back in 1999.  These do not require any surveillance imaging.  Patient can continue to get yearly surveillance mammograms with Dr. Ancil Boozer.  No follow-up with me required  Follow-up instructions: No follow-up needed  I discussed the assessment and treatment plan  with the patient. The patient was provided an opportunity to ask questions and all were answered. The patient agreed with the plan and demonstrated an understanding of the instructions.   The patient was advised to call back or seek an in-person evaluation if the symptoms worsen or if the condition fails to improve as anticipated.  I provided 11 minutes of non face-to-face telephone visit time during this encounter, and > 50% was spent counseling as documented under my assessment & plan.  Visit Diagnosis: 1. Normocytic anemia     Dr. Randa Evens, MD, MPH [facility] at [facility] Tel- [phone] 12/14/2022 1:41 PM

## 2022-12-18 ENCOUNTER — Telehealth: Payer: Self-pay | Admitting: Family Medicine

## 2022-12-18 ENCOUNTER — Other Ambulatory Visit: Payer: Self-pay

## 2022-12-18 DIAGNOSIS — M8580 Other specified disorders of bone density and structure, unspecified site: Secondary | ICD-10-CM

## 2022-12-18 DIAGNOSIS — M85852 Other specified disorders of bone density and structure, left thigh: Secondary | ICD-10-CM

## 2022-12-18 NOTE — Telephone Encounter (Signed)
Referral Request - Has patient seen PCP for this complaint? yes *If NO, is insurance requiring patient see PCP for this issue before PCP can refer them? Referral for which specialty: imaging/bone density test Preferred provider/office: ? Reason for referral:  cancer patient so routine to have bone density tests

## 2022-12-18 NOTE — Telephone Encounter (Signed)
Order placed

## 2022-12-31 ENCOUNTER — Encounter: Payer: Self-pay | Admitting: Podiatry

## 2023-01-08 ENCOUNTER — Ambulatory Visit: Payer: Medicare Other | Admitting: Podiatry

## 2023-01-08 ENCOUNTER — Encounter: Payer: Self-pay | Admitting: Podiatry

## 2023-01-08 VITALS — BP 165/58 | HR 74

## 2023-01-08 DIAGNOSIS — B351 Tinea unguium: Secondary | ICD-10-CM | POA: Diagnosis not present

## 2023-01-08 DIAGNOSIS — M79675 Pain in left toe(s): Secondary | ICD-10-CM | POA: Diagnosis not present

## 2023-01-08 DIAGNOSIS — M216X1 Other acquired deformities of right foot: Secondary | ICD-10-CM

## 2023-01-08 DIAGNOSIS — E119 Type 2 diabetes mellitus without complications: Secondary | ICD-10-CM

## 2023-01-08 DIAGNOSIS — L84 Corns and callosities: Secondary | ICD-10-CM

## 2023-01-08 DIAGNOSIS — M79674 Pain in right toe(s): Secondary | ICD-10-CM | POA: Diagnosis not present

## 2023-01-08 NOTE — Progress Notes (Signed)
This patient presents  to my office for at risk foot care.  This patient requires this care by a professional since this patient will be at risk due to having type 2 diabetes and PAD.  .  This patient is unable to cut nails callus  since the patient cannot reach her calluses..These calluses are painful walking and wearing shoes.  She is also having ingrown toenails both big toes.She also has painful callus on the outside of balls of her feet and corn 4th toe right foot.  This patient presents for at risk foot care today.  General Appearance  Alert, conversant and in no acute stress.  Vascular  Dorsalis pedis and posterior tibial  pulses are  weakly palpable  bilaterally.  Capillary return is within normal limits  bilaterally. Temperature is within normal limits  bilaterally.  Neurologic  Senn-Weinstein monofilament wire test within normal limits  bilaterally. Muscle power within normal limits bilaterally.  Nails Normal nails  Bilateral.. No evidence of bacterial infection or drainage bilaterally. Thick ingrown toenails both borders hallux  B/l.  Orthopedic  No limitations of motion  feet .  No crepitus or effusions noted.  No bony pathology .  Hammer toes 2-5  B/L. HAV  right foot.  Skin  normotropic skin  noted bilaterally.  No signs of infections or ulcers noted.   Callus sub 5th  B/L.     Callus secondary to plantar flexed metatarsals  B/L.  Clavi  B/L  Onychomycosis  Hallux  B/l.  Consent was obtained for treatment procedures.   Debridement of callus with # 15 blade followed by dremel tool usage.  Debride hallux  B/L. Debride callus sub 5  B/L.  Return office visit  3 months                    Told patient to return for periodic foot care and evaluation due to potential at risk complications.   Marya Lowden DPM  

## 2023-01-12 ENCOUNTER — Ambulatory Visit: Payer: Medicare Other | Admitting: Podiatry

## 2023-03-13 ENCOUNTER — Ambulatory Visit: Payer: Medicare Other | Admitting: Family Medicine

## 2023-03-23 ENCOUNTER — Other Ambulatory Visit: Payer: Self-pay | Admitting: Nurse Practitioner

## 2023-03-23 DIAGNOSIS — E119 Type 2 diabetes mellitus without complications: Secondary | ICD-10-CM

## 2023-03-24 NOTE — Telephone Encounter (Signed)
Requested Prescriptions  Pending Prescriptions Disp Refills   gabapentin (NEURONTIN) 100 MG capsule [Pharmacy Med Name: GABAPENTIN 100 MG CAPSULE] 90 capsule 1    Sig: TAKE 2 CAPSULES BY MOUTH AT BEDTIME.     Neurology: Anticonvulsants - gabapentin Passed - 03/23/2023  2:18 PM      Passed - Cr in normal range and within 360 days    Creat  Date Value Ref Range Status  07/22/2022 0.75 0.60 - 0.95 mg/dL Final   Creatinine, Urine  Date Value Ref Range Status  06/10/2019 120 20 - 275 mg/dL Final         Passed - Completed PHQ-2 or PHQ-9 in the last 360 days      Passed - Valid encounter within last 12 months    Recent Outpatient Visits           4 months ago Type 2 diabetes mellitus without complication, without long-term current use of insulin [facility])   [place] [facility] Alba Cory, MD   4 months ago Encounter for [employer] annual wellness exam   [facility] [place] D[place] M, DO   8 months ago Type 2 diabetes mellitus with neuropathy, without long-term current use of insulin [facility])   [facility] Berniece Salines, FNP   12 months ago Essential hypertension   [facility] Berniece Salines, FNP   1 year ago Essential hypertension   [facility] Berniece Salines, FNP       Future Appointments             In 1 week Alba Cory, MD [facility], [facility]   In 7 months  [facility]

## 2023-03-31 NOTE — Progress Notes (Unsigned)
Name: Rachael Castro   MRN: [id number]    DOB: January 12, 1931   Date:04/01/2023       Progress Note  Subjective  Chief Complaint  Follow Up  HPI   PAD: she was seen by Dr. Wyn Quaker 10/2021, studies showed mild disease, she has mild symptoms and given reassurance and continue current medications. Unchanged   DM type II with dyslipidemia, PAD, and neuropathy: she is currently taking Janumet and A1C has gone up a little to 7 %, still at goal . She states no po.lyphagia, polydipsia or polyuria.  She takes gabapentin . She sees podiatrist for callus on her feet Continue statin therapy we stopped ARB due to low bp.   History of breast cancer - right side in 1999: mammogram is up to date  History of gastric stromal tumor in 2013, last visit with Dr. Merlene Pulling was in 2021, per note she was supposed to go back for imaging and follow up in 6 months but patients states she thought she had been released from her care. She takes PPI for GERD , appetite has improved, she had lost some weight after husband died last Fall.   Osteopenia: last bone density was 2021, she is due for repeat, she will have it done 04/17/2023   Patient Active Problem List   Diagnosis Date Noted   Pain due to onychomycosis of toenails of both feet 09/05/2021   PAD (peripheral artery disease) (HCC) 03/29/2021   Callus 02/21/2021   Plantar flexed metatarsal bone of left foot 02/21/2021   Plantar flexed metatarsal bone of right foot 02/21/2021   Clavi 02/21/2021   Callus of foot 01/25/2021   Restless leg syndrome 01/25/2021   Gastroesophageal reflux disease 01/25/2021   Normocytic anemia 10/18/2020   Impingement syndrome of shoulder region 05/16/2019   Osteopenia 12/24/2017   Hypomagnesemia 09/01/2016   Gastrointestinal stromal tumor (GIST) (HCC) 08/09/2016   Mixed hyperlipidemia 10/23/2015   Type 2 diabetes mellitus without complication, without long-term current use of insulin (HCC) 10/23/2015   Essential hypertension  06/18/2015   H/O malignant gastrointestinal stromal tumor (GIST) 05/30/2015   Postherpetic neuralgia 04/17/2015   DDD (degenerative disc disease), lumbar 04/17/2015   Personal history of malignant neoplasm of breast 07/04/2013   History of colonic polyps     Past Surgical History:  Procedure Laterality Date   ABDOMINAL HYSTERECTOMY     APPENDECTOMY     BREAST BIOPSY Left 09/08/2016   Done by Dr. Evette Cristal   BREAST EXCISIONAL BIOPSY Right 1999   Mastectomy   BREAST SURGERY Right 1999   mastectomy   CATARACT EXTRACTION W/PHACO Left 04/30/2015   Procedure: CATARACT EXTRACTION PHACO AND INTRAOCULAR LENS PLACEMENT (IOC);  Surgeon: Sallee Lange, MD;  Location: [facility] ORS;  Service: Ophthalmology;  Laterality: Left;  [place] 01:39 AP% 23.3 CDE 40.42 fluid pack lot # [id number]   COLONOSCOPY  2008   Sankar   EUS  02/12/2012   Procedure: UPPER ENDOSCOPIC ULTRASOUND (EUS) LINEAR;  Surgeon: Rachael Fee, MD;  Location: [facility];  Service: Endoscopy;  Laterality: N/A;  radial linear   EYE SURGERY Right 2013   cataract   HERNIA REPAIR  2012   LAPAROTOMY  2013   excision of gastric wall mass    MASTECTOMY     SALPINGOOPHORECTOMY Right 1979   THYROIDECTOMY  1978   TONSILLECTOMY     TUBAL LIGATION     UPPER GI ENDOSCOPY  2013    Family History  Problem Relation Age of Onset  Cancer Mother        cervical   Cancer Father        prostate   Kidney Stones Brother    Glaucoma Brother    Prostatitis Brother    Diabetes Sister    Thyroid disease Brother    Cancer Brother        thyroid   Arthritis Brother    Diabetes Sister    Kidney disease Neg Hx    Bladder Cancer Neg Hx    Breast cancer Neg Hx     Social History   Tobacco Use   Smoking status: Never   Smokeless tobacco: Never  Substance Use Topics   Alcohol use: No     Current Outpatient Medications:    atorvastatin (LIPITOR) 80 MG tablet, TAKE 1 TABLET BY MOUTH EVERYDAY AT BEDTIME, Disp: 90 tablet, Rfl: 3   calcium  citrate-vitamin D 200-200 MG-UNIT TABS, Take 1 tablet by mouth daily., Disp: , Rfl:    loratadine (CLARITIN) 10 MG tablet, TAKE 1 TABLET BY MOUTH EVERYDAY AT BEDTIME, Disp: 90 tablet, Rfl: 3   Magnesium 100 MG CAPS, Take 1 capsule by mouth daily., Disp: , Rfl:    Multiple Vitamin (MULTIVITAMIN) tablet, Take 1 tablet by mouth daily., Disp: , Rfl:    sitaGLIPtin-metformin (JANUMET) 50-500 MG tablet, Take 1 tablet by mouth daily., Disp: 90 tablet, Rfl: 3   thiamine (VITAMIN B-1) 100 MG tablet, Take 100 mg by mouth daily., Disp: , Rfl:    baclofen 5 MG TABS, Take 1-2 tablets (5-10 mg total) by mouth at bedtime as needed for muscle spasms. (Patient not taking: Reported on 01/08/2023), Disp: 10 tablet, Rfl: 0   gabapentin (NEURONTIN) 300 MG capsule, Take 1 capsule (300 mg total) by mouth at bedtime., Disp: 90 capsule, Rfl: 1   omeprazole (PRILOSEC) 20 MG capsule, Take 1 capsule (20 mg total) by mouth daily as needed., Disp: 90 capsule, Rfl: 1  Allergies  Allergen Reactions   Fish Allergy Rash    Rash and vomiting    I personally reviewed active problem list, medication list, allergies, family history, social history, health maintenance with the patient/caregiver today.   ROS  Constitutional: Negative for fever or weight change.  Respiratory: Negative for cough and shortness of breath.   Cardiovascular: Negative for chest pain or palpitations.  Gastrointestinal: Negative for abdominal pain, no bowel changes.  Musculoskeletal: Negative for gait problem or joint swelling.  Skin: Negative for rash.  Neurological: Negative for dizziness or headache.  No other specific complaints in a complete review of systems (except as listed in HPI above).   Objective  Vitals:   04/01/23 1346  BP: 124/62  Pulse: 74  Resp: 16  SpO2: 94%  Weight: 112 lb (50.8 kg)  Height: 5\' 4"  (1.626 m)    Body mass index is 19.22 kg/m.  Physical exam Constitutional: Patient appears well-developed and  well-nourished. No distress.  HEENT: head atraumatic, normocephalic, pupils equal and reactive to light, neck supple Cardiovascular: Normal rate, regular rhythm and normal heart sounds.  No murmur heard. No BLE edema. Pulmonary/Chest: Effort normal and breath sounds normal. No respiratory distress. Abdominal: Soft.  There is no tenderness. Psychiatric: Patient has a normal mood and affect. behavior is normal. Judgment and thought content normal.   Recent Results (from the past 2160 hour(s))  POCT HgB A1C     Status: Abnormal   Collection Time: 04/01/23  1:53 PM  Result Value Ref Range   Hemoglobin A1C 7.0 (A)  4.0 - 5.6 %   HbA1c POC (<> result, manual entry)     HbA1c, POC (prediabetic range)     HbA1c, POC (controlled diabetic range)      Diabetic Foot Exam: Diabetic Foot Exam - Simple   Simple Foot Form Visual Inspection No deformities, no ulcerations, no other skin breakdown bilaterally: Yes Sensation Testing Intact to touch and monofilament testing bilaterally: Yes Pulse Check Posterior Tibialis and Dorsalis pulse intact bilaterally: Yes Comments      PHQ2/9:    04/01/2023    1:47 PM 11/21/2022    1:22 PM 11/10/2022    9:52 AM 07/22/2022    2:14 PM 12/09/2021    1:16 PM  Depression screen PHQ 2/9  Decreased Interest 0 0 0 2 0  Down, Depressed, Hopeless 1 0 0 1 0  PHQ - 2 Score 1 0 0 3 0  Altered sleeping 0 0  0   Tired, decreased energy 0 0  0   Change in appetite 0 1  1   Feeling bad or failure about yourself  0 0  0   Trouble concentrating 0 0  0   Moving slowly or fidgety/restless 0 0  0   Suicidal thoughts 0 0  0   PHQ-9 Score 1 1  4    Difficult doing work/chores    Not difficult at all     phq 9 is negative   Fall Risk:    04/01/2023    1:46 PM 11/21/2022    1:22 PM 11/10/2022    9:51 AM 07/22/2022    2:14 PM 03/25/2022    1:27 PM  Fall Risk   Falls in the past year? 0 0 0 1 0  Number falls in past yr: 0 0 0 0 0  Injury with Fall? 0 0 0 0 0  Risk for  fall due to : No Fall Risks No Fall Risks  History of fall(s)   Follow up Falls prevention discussed Falls prevention discussed Falls evaluation completed Falls evaluation completed Falls evaluation completed      Functional Status Survey: Is the patient deaf or have difficulty hearing?: No Does the patient have difficulty seeing, even when wearing glasses/contacts?: No Does the patient have difficulty concentrating, remembering, or making decisions?: No Does the patient have difficulty walking or climbing stairs?: Yes Does the patient have difficulty dressing or bathing?: No Does the patient have difficulty doing errands alone such as visiting a doctor's office or shopping?: No    Assessment & Plan  1. Diabetic polyneuropathy associated with type 2 diabetes mellitus (HCC)  - POCT HgB A1C - HM Diabetes Foot Exam - Urine Microalbumin w/creat. ratio - COMPLETE METABOLIC PANEL WITH GFR - gabapentin (NEURONTIN) 300 MG capsule; Take 1 capsule (300 mg total) by mouth at bedtime.  Dispense: 90 capsule; Refill: 1  2. PAD (peripheral artery disease) (HCC)  Seen by vascular doctor, on statin therapy   3. History of Essential hypertension  Off ARB and bp is at goal   4. Gastroesophageal reflux disease without esophagitis  - omeprazole (PRILOSEC) 20 MG capsule; Take 1 capsule (20 mg total) by mouth daily as needed.  Dispense: 90 capsule; Refill: 1  5. Restless leg syndrome  She states symptoms are controlled with gabapentin  6. Gastrointestinal stromal tumor (GIST) (HCC)  - omeprazole (PRILOSEC) 20 MG capsule; Take 1 capsule (20 mg total) by mouth daily as needed.  Dispense: 90 capsule; Refill: 1

## 2023-04-01 ENCOUNTER — Ambulatory Visit: Payer: Medicare Other | Admitting: Family Medicine

## 2023-04-01 ENCOUNTER — Encounter: Payer: Self-pay | Admitting: Family Medicine

## 2023-04-01 VITALS — BP 124/62 | HR 74 | Resp 16 | Ht 64.0 in | Wt 112.0 lb

## 2023-04-01 DIAGNOSIS — Z7984 Long term (current) use of oral hypoglycemic drugs: Secondary | ICD-10-CM

## 2023-04-01 DIAGNOSIS — Z8679 Personal history of other diseases of the circulatory system: Secondary | ICD-10-CM

## 2023-04-01 DIAGNOSIS — G2581 Restless legs syndrome: Secondary | ICD-10-CM

## 2023-04-01 DIAGNOSIS — I739 Peripheral vascular disease, unspecified: Secondary | ICD-10-CM | POA: Diagnosis not present

## 2023-04-01 DIAGNOSIS — K219 Gastro-esophageal reflux disease without esophagitis: Secondary | ICD-10-CM | POA: Diagnosis not present

## 2023-04-01 DIAGNOSIS — E119 Type 2 diabetes mellitus without complications: Secondary | ICD-10-CM

## 2023-04-01 DIAGNOSIS — E1142 Type 2 diabetes mellitus with diabetic polyneuropathy: Secondary | ICD-10-CM

## 2023-04-01 DIAGNOSIS — C49A0 Gastrointestinal stromal tumor, unspecified site: Secondary | ICD-10-CM

## 2023-04-01 LAB — POCT GLYCOSYLATED HEMOGLOBIN (HGB A1C): Hemoglobin A1C: 7 % — AB (ref 4.0–5.6)

## 2023-04-01 MED ORDER — GABAPENTIN 300 MG PO CAPS: 300.0000 mg | ORAL_CAPSULE | Freq: Every day | ORAL | 1 refills | Status: DC

## 2023-04-01 MED ORDER — OMEPRAZOLE 20 MG PO CPDR: 20.0000 mg | DELAYED_RELEASE_CAPSULE | Freq: Every day | ORAL | 1 refills | Status: DC | PRN

## 2023-04-02 LAB — COMPLETE METABOLIC PANEL WITH GFR
AG Ratio: 1.6 (calc) (ref 1.0–2.5)
ALT: 11 U/L (ref 6–29)
AST: 17 U/L (ref 10–35)
Albumin: 4 g/dL (ref 3.6–5.1)
Alkaline phosphatase (APISO): 71 U/L (ref 37–153)
BUN: 17 mg/dL (ref 7–25)
CO2: 26 mmol/L (ref 20–32)
Calcium: 9.1 mg/dL (ref 8.6–10.4)
Chloride: 106 mmol/L (ref 98–110)
Creat: 0.78 mg/dL (ref 0.60–0.95)
Globulin: 2.5 g/dL (calc) (ref 1.9–3.7)
Glucose, Bld: 128 mg/dL — ABNORMAL HIGH (ref 65–99)
Potassium: 3.7 mmol/L (ref 3.5–5.3)
Sodium: 142 mmol/L (ref 135–146)
Total Bilirubin: 0.3 mg/dL (ref 0.2–1.2)
Total Protein: 6.5 g/dL (ref 6.1–8.1)
eGFR: 73 mL/min/{1.73_m2} (ref >=60)

## 2023-04-02 LAB — MICROALBUMIN / CREATININE URINE RATIO
Creatinine, Urine: 180 mg/dL (ref 20–275)
Microalb Creat Ratio: 17 mg/g creat (ref <30)
Microalb, Ur: 3 mg/dL

## 2023-04-09 ENCOUNTER — Ambulatory Visit: Payer: Medicare Other | Admitting: Podiatry

## 2023-04-13 ENCOUNTER — Ambulatory Visit: Payer: Medicare Other | Admitting: Podiatry

## 2023-04-13 ENCOUNTER — Encounter: Payer: Self-pay | Admitting: Podiatry

## 2023-04-13 VITALS — BP 139/55 | HR 72

## 2023-04-13 DIAGNOSIS — B351 Tinea unguium: Secondary | ICD-10-CM

## 2023-04-13 DIAGNOSIS — L84 Corns and callosities: Secondary | ICD-10-CM | POA: Diagnosis not present

## 2023-04-13 DIAGNOSIS — E1151 Type 2 diabetes mellitus with diabetic peripheral angiopathy without gangrene: Secondary | ICD-10-CM

## 2023-04-13 DIAGNOSIS — M79675 Pain in left toe(s): Secondary | ICD-10-CM

## 2023-04-13 DIAGNOSIS — M79674 Pain in right toe(s): Secondary | ICD-10-CM | POA: Diagnosis not present

## 2023-04-13 NOTE — Progress Notes (Unsigned)
  Subjective:  Patient ID: Rachael Castro, female    DOB: 15-May-1931,  MRN: [id number]  Rachael Castro presents to clinic today for {jgcomplaint:23593}  Chief Complaint  Patient presents with   Diabetes    "Calluses and nails"- Dr. Alba Cory - 04/01/2023, 123mg /dl on 35/00/93   New problem(s): None. {jgcomplaint:23593}  PCP is Alba Cory, MD.  Allergies  Allergen Reactions   Fish Allergy Rash    Rash and vomiting    Review of Systems: Negative except as noted in the HPI.  Objective: No changes noted in today's physical examination. Vitals:   04/13/23 1347  BP: (!) 139/55  Pulse: 72   Rachael Castro is a pleasant 89 y.o. female {jgbodyhabitus:24098} AAO x 3.  Vascular Examination: CFT <3 seconds b/l. DP/PT pulses faintly palpable b/l. Skin temperature gradient warm to warm b/l. No pain with calf compression. No ischemia or gangrene. No cyanosis or clubbing noted b/l. {jgvascular:23595}   Neurological Examination: Sensation grossly intact b/l with 10 gram monofilament. Vibratory sensation intact b/l. {jgneuro:23601::"Protective sensation intact 5/5 intact bilaterally with 10g monofilament b/l.","Vibratory sensation intact b/l.","Proprioception intact bilaterally."}  Dermatological Examination: Pedal skin warm and supple b/l.   No open wounds. No interdigital macerations.  Toenails 1-5 b/l thick, discolored, elongated with subungual debris and pain on dorsal palpation.    {jgderm:23598}  Musculoskeletal Examination: Muscle strength 5/5 to b/l LE. {jgmsk:23600}  Radiographs: None  Xrays {jgPodToeLocator:23637}: {jgxrayfindings:23683}  Last A1c:      Latest Ref Rng & Units 04/01/2023    1:53 PM 11/21/2022    1:24 PM 07/22/2022    2:48 PM  Hemoglobin A1C  Hemoglobin-A1c 4.0 - 5.6 % 7.0  7.0  6.6    Assessment/Plan: No diagnosis found.  No orders of the defined types were placed in this encounter.   None {Jgplan:23602::"-Patient/POA to call  should there be question/concern in the interim."}   No follow-ups on file.  Freddie Breech, DPM

## 2023-04-16 ENCOUNTER — Encounter: Payer: Self-pay | Admitting: Podiatry

## 2023-04-17 ENCOUNTER — Other Ambulatory Visit: Payer: Medicare Other

## 2023-06-15 ENCOUNTER — Ambulatory Visit
Admission: RE | Admit: 2023-06-15 | Discharge: 2023-06-15 | Disposition: A | Discharge status: Home / Self Care | Payer: Medicare Other | Source: Ambulatory Visit | Attending: Family Medicine | Admitting: Family Medicine

## 2023-06-15 DIAGNOSIS — M8580 Other specified disorders of bone density and structure, unspecified site: Secondary | ICD-10-CM | POA: Diagnosis present

## 2023-06-15 DIAGNOSIS — M85852 Other specified disorders of bone density and structure, left thigh: Secondary | ICD-10-CM | POA: Diagnosis present

## 2023-07-23 ENCOUNTER — Encounter: Payer: Self-pay | Admitting: Podiatry

## 2023-07-23 ENCOUNTER — Ambulatory Visit: Payer: Medicare Other | Admitting: Podiatry

## 2023-07-23 DIAGNOSIS — M79674 Pain in right toe(s): Secondary | ICD-10-CM

## 2023-07-23 DIAGNOSIS — L84 Corns and callosities: Secondary | ICD-10-CM

## 2023-07-23 DIAGNOSIS — M79675 Pain in left toe(s): Secondary | ICD-10-CM

## 2023-07-23 DIAGNOSIS — B351 Tinea unguium: Secondary | ICD-10-CM | POA: Diagnosis not present

## 2023-07-23 DIAGNOSIS — E1151 Type 2 diabetes mellitus with diabetic peripheral angiopathy without gangrene: Secondary | ICD-10-CM | POA: Diagnosis not present

## 2023-07-26 NOTE — Progress Notes (Signed)
Subjective:  Patient ID: Rachael Castro, female    DOB: 1931-07-29,  MRN: [id number]  Rachael Castro presents to clinic today for at risk foot care. Pt has h/o NIDDM with PAD and callus(es) of both feet and painful thick toenails that are difficult to trim. Painful toenails interfere with ambulation. Aggravating factors include wearing enclosed shoe gear. Pain is relieved with periodic professional debridement. Painful calluses are aggravated when weightbearing with and without shoegear. Pain is relieved with periodic professional debridement.  Chief Complaint  Patient presents with   Diabetes    DFC BS-103 A1C6.7 PCPV-05/2023   New problem(s): None.   PCP is Alba Cory, MD.  Allergies  Allergen Reactions   Fish Allergy Rash    Rash and vomiting    Review of Systems: Negative except as noted in the HPI.  Objective: No changes noted in today's physical examination. There were no vitals filed for this visit. Rachael Castro is a pleasant 89 y.o. female WD, WN in NAD. AAO x 3.  Vascular Examination: CFT <3 seconds b/l. DP/PT pulses faintly palpable b/l. Skin temperature gradient warm to warm b/l. No pain with calf compression. No ischemia or gangrene. No cyanosis or clubbing noted b/l. No ischemia or gangrene noted b/l LE. No cyanosis or clubbing noted b/l LE.   Neurological Examination: Sensation grossly intact b/l with 10 gram monofilament. Vibratory sensation intact b/l.   Dermatological Examination: Pedal skin warm and supple b/l.   No open wounds. No interdigital macerations.  Toenails 1-5 b/l thick, discolored, elongated with subungual debris and pain on dorsal palpation.    Hyperkeratotic lesion(s) bilateral great toes, bilateral 5th toes, and submet head 5 b/l.  No erythema, no edema, no drainage, no fluctuance.  Musculoskeletal Examination: Muscle strength 5/5 to b/l LE. Plantarflexed metatarsal(s) 5th metatarsal head b/l lower extremities. HAV with  bunion bilaterally and hammertoes 2-5 b/l.  Radiographs: None  Assessment/Plan: 1. Pain due to onychomycosis of toenails of both feet   2. Callus   3. Type II diabetes mellitus with peripheral circulatory disorder (HCC)     -Consent given for treatment as described below: -Examined patient. -Continue supportive shoe gear daily. -Mycotic toenails 1-5 bilaterally were debrided in length and girth with sterile nail nippers and dremel without incident. -Corn(s) bilateral 5th toes pared utilizing sterile scalpel blade without complication or incident. Total number debrided=2. -Callus(es) bilateral great toes and submet head 5 b/l pared utilizing sharp debridement with sterile blade without complication or incident. Total number debrided =4. -Patient/POA to call should there be question/concern in the interim.   Return in about 3 months (around 10/23/2023).  Freddie Breech, DPM

## 2023-07-27 LAB — HM DIABETES EYE EXAM

## 2023-07-29 ENCOUNTER — Ambulatory Visit (INDEPENDENT_AMBULATORY_CARE_PROVIDER_SITE_OTHER): Payer: Medicare Other

## 2023-07-29 ENCOUNTER — Encounter: Payer: Self-pay | Admitting: Nurse Practitioner

## 2023-07-29 DIAGNOSIS — Z23 Encounter for immunization: Secondary | ICD-10-CM

## 2023-07-29 NOTE — Progress Notes (Signed)
Patient presented in expressed good health with no prior history of adverse reaction to the influenza vaccine. Patient tolerated injection well and had no questions or concerns at time of clinic departure.

## 2023-08-11 ENCOUNTER — Other Ambulatory Visit: Payer: Self-pay | Admitting: Nurse Practitioner

## 2023-08-11 DIAGNOSIS — E119 Type 2 diabetes mellitus without complications: Secondary | ICD-10-CM

## 2023-08-12 NOTE — Telephone Encounter (Signed)
Requested Prescriptions  Pending Prescriptions Disp Refills   sitaGLIPtin-metformin (JANUMET) 50-500 MG tablet [Pharmacy Med Name: JANUMET 50-500 MG TABLET] 90 tablet 0    Sig: TAKE 1 TABLET BY MOUTH EVERY DAY     Endocrinology:  Diabetes - Biguanide + DPP-4 Inhibitor Combos Passed - 08/11/2023  1:35 AM      Passed - HBA1C is between 0 and 7.9 and within 180 days    Hemoglobin A1C  Date Value Ref Range Status  04/01/2023 7.0 (A) 4.0 - 5.6 % Final  02/28/2012 6.2 4.2 - 6.3 % Final    Comment:    The American Diabetes Association recommends that a primary goal of therapy should be <7% and that physicians should reevaluate the treatment regimen in patients with HbA1c values consistently >8%.    HbA1c, POC (controlled diabetic range)  Date Value Ref Range Status  06/10/2019 6.5 0.0 - 7.0 % Final   Hgb A1c MFr Bld  Date Value Ref Range Status  07/22/2022 6.6 (H) <5.7 % of total Hgb Final    Comment:    For someone without known diabetes, a hemoglobin A1c value of 6.5% or greater indicates that they may have  diabetes and this should be confirmed with a follow-up  test. . For someone with known diabetes, a value <7% indicates  that their diabetes is well controlled and a value  greater than or equal to 7% indicates suboptimal  control. A1c targets should be individualized based on  duration of diabetes, age, comorbid conditions, and  other considerations. . Currently, no consensus exists regarding use of hemoglobin A1c for diagnosis of diabetes for children. .          Passed - Cr in normal range and within 360 days    Creat  Date Value Ref Range Status  04/01/2023 0.78 0.60 - 0.95 mg/dL Final   Creatinine, Urine  Date Value Ref Range Status  04/01/2023 180 20 - 275 mg/dL Final         Passed - eGFR in normal range and within 360 days    GFR, Est African American  Date Value Ref Range Status  11/27/2020 76 > OR = 60 mL/min/1.77m2 Final   GFR, Est Non African  American  Date Value Ref Range Status  11/27/2020 65 > OR = 60 mL/min/1.83m2 Final   eGFR  Date Value Ref Range Status  04/01/2023 73 > OR = 60 mL/min/1.19m2 Final         Passed - B12 Level in normal range and within 720 days    Vitamin B-12  Date Value Ref Range Status  11/21/2022 775 200 - 1,100 pg/mL Final         Passed - Valid encounter within last 6 months    Recent Outpatient Visits           4 months ago Diabetic polyneuropathy associated with type 2 diabetes mellitus [facility])   [place] [facility] [place], Danna Hefty, MD   8 months ago Type 2 diabetes mellitus without complication, without long-term current use of insulin [facility])   [place] [facility] Alba Cory, MD   9 months ago Encounter for [employer] annual wellness exam   [facility] Caro Laroche, DO   1 year ago Type 2 diabetes mellitus with neuropathy, without long-term current use of insulin [facility])   [facility] Berniece Salines, FNP   1 year ago Essential hypertension   [place]  [facility] Berniece Salines, FNP       Future Appointments             In 1 month Alba Cory, MD [facility]   In 3 months  [facility]            Passed - CBC within normal limits and completed in the last 12 months    WBC  Date Value Ref Range Status  12/05/2022 7.8 4.0 - 10.5 K/uL Final   RBC  Date Value Ref Range Status  12/05/2022 3.64 (L) 3.87 - 5.11 MIL/uL Final   RBC.  Date Value Ref Range Status  12/05/2022 3.65 (L) 3.87 - 5.11 MIL/uL Final   Hemoglobin  Date Value Ref Range Status  12/05/2022 11.6 (L) 12.0 - 15.0 g/dL Final   HGB  Date Value Ref Range Status  02/14/2015 12.3 12.0 - 16.0 g/dL Final   HCT  Date Value Ref Range Status  12/05/2022 35.5 (L) 36.0 - 46.0 % Final  02/14/2015 36.0 35.0 - 47.0 % Final   MCHC  Date  Value Ref Range Status  12/05/2022 32.7 30.0 - 36.0 g/dL Final   [facility]  Date Value Ref Range Status  12/05/2022 31.9 26.0 - 34.0 pg Final   MCV  Date Value Ref Range Status  12/05/2022 97.5 80.0 - 100.0 fL Final  02/14/2015 94 80 - 100 fL Final   No results found for: "PLTCOUNTKUC", "LABPLAT", "POCPLA" RDW  Date Value Ref Range Status  12/05/2022 13.8 11.5 - 15.5 % Final  02/14/2015 13.1 11.5 - 14.5 % Final

## 2023-09-06 ENCOUNTER — Other Ambulatory Visit: Payer: Self-pay | Admitting: Nurse Practitioner

## 2023-09-06 DIAGNOSIS — E782 Mixed hyperlipidemia: Secondary | ICD-10-CM

## 2023-09-07 NOTE — Telephone Encounter (Signed)
Requested by interface surescripts. Future visit scheduled in 3 weeks.  Requested Prescriptions  Pending Prescriptions Disp Refills   atorvastatin (LIPITOR) 80 MG tablet [Pharmacy Med Name: ATORVASTATIN 80 MG TABLET] 90 tablet 0    Sig: TAKE 1 TABLET BY MOUTH EVERYDAY AT BEDTIME     Cardiovascular:  Antilipid - Statins Failed - 09/06/2023  8:45 AM      Failed - Lipid Panel in normal range within the last 12 months    Cholesterol, Total  Date Value Ref Range Status  06/18/2015 224 (H) 100 - 199 mg/dL Final   Cholesterol  Date Value Ref Range Status  07/22/2022 209 (H) <200 mg/dL Final   LDL Cholesterol (Calc)  Date Value Ref Range Status  07/22/2022 126 (H) mg/dL (calc) Final    Comment:    Reference range: <100 . Desirable range <100 mg/dL for primary prevention;   <70 mg/dL for patients with CHD or diabetic patients  with > or = 2 CHD risk factors. Marland Kitchen LDL-C is now calculated using the Martin-Hopkins  calculation, which is a validated novel method providing  better accuracy than the Friedewald equation in the  estimation of LDL-C.  Horald Pollen et al. Lenox Ahr. 1308;657(84): 2061-2068  (http://education.[facility].com/faq/FAQ164)    HDL  Date Value Ref Range Status  07/22/2022 61 > OR = 50 mg/dL Final  69/62/9528 57 >41 mg/dL Final    Comment:    According to ATP-III Guidelines, HDL-C >59 mg/dL is considered a negative risk factor for CHD.    Triglycerides  Date Value Ref Range Status  07/22/2022 109 <150 mg/dL Final         Passed - Patient is not pregnant      Passed - Valid encounter within last 12 months    Recent Outpatient Visits           5 months ago Diabetic polyneuropathy associated with type 2 diabetes mellitus [facility])   [place] [facility] Alba Cory, MD   9 months ago Type 2 diabetes mellitus without complication, without long-term current use of insulin [facility])   [place] [facility] Alba Cory, MD   10  months ago Encounter for [employer] annual wellness exam   [facility] Caro Laroche, DO   1 year ago Type 2 diabetes mellitus with neuropathy, without long-term current use of insulin [facility])   [place] [facility] Berniece Salines, FNP   1 year ago Essential hypertension   [facility] Berniece Salines, FNP       Future Appointments             In 3 weeks Alba Cory, MD [facility]   In 2 months  [facility]

## 2023-09-21 ENCOUNTER — Other Ambulatory Visit: Payer: Self-pay | Admitting: Family Medicine

## 2023-09-21 DIAGNOSIS — Z1231 Encounter for screening mammogram for malignant neoplasm of breast: Secondary | ICD-10-CM

## 2023-09-24 NOTE — Progress Notes (Signed)
Name: Rachael Castro   MRN: [id number]    DOB: 1931/10/06   Date:10/02/2023       Progress Note  Subjective  Chief Complaint  Chief Complaint  Patient presents with   Medical Management of Chronic Issues    Pt would like an actual rx for allergie medicine not OTC    HPI  Discussed the use of AI scribe software for clinical note transcription with the patient, who gave verbal consent to proceed.  Discussed the use of AI scribe software for clinical note transcription with the patient, who gave verbal consent to proceed.  History of Present Illness   The patient, with a history of peripheral artery disease, diabetes type II, and cancer (Breast and Gastrointestinal Stromal), presents for a routine follow-up. She reports no significant changes in her health status since the last visit. The patient denies experiencing claudication or muscle aches associated with her peripheral artery disease and is compliant with her prescribed atorvastatin and aspirin regimen.  The patient's diabetes is well-controlled with Janumet, with recent self-monitoring blood glucose levels reported as high as 123. She also reports mild neuropathic pain in her legs, which has been managed effectively with gabapentin.  The patient has a history of multiple cancers, including right breast cancer and a gastrointestinal stromal tumor. Both cancers were treated and are currently in remission. The patient continues to follow up with her oncologist annually.  The patient also reports occasional diarrhea, which she attributes to certain foods, such as raw spinach. She denies any other gastrointestinal symptoms.  The patient has been experiencing a runny nose, which she manages with loratadine. She prefers the tablet form of this medication, as she has difficulty with the packaging of other forms.  The patient lives independently and maintains an active lifestyle, despite her age and medical conditions. She recently  experienced some weight loss due to stress and inadequate nutrition while caring for her late husband. The patient's weight has since stabilized, and she reports a good appetite.                Patient Active Problem List   Diagnosis Date Noted   Pain due to onychomycosis of toenails of both feet 09/05/2021   PAD (peripheral artery disease) (HCC) 03/29/2021   Callus 02/21/2021   Plantar flexed metatarsal bone of left foot 02/21/2021   Plantar flexed metatarsal bone of right foot 02/21/2021   Clavi 02/21/2021   Callus of foot 01/25/2021   Restless leg syndrome 01/25/2021   Gastroesophageal reflux disease 01/25/2021   Normocytic anemia 10/18/2020   Impingement syndrome of shoulder region 05/16/2019   Osteopenia 12/24/2017   Hypomagnesemia 09/01/2016   Gastrointestinal stromal tumor (GIST) (HCC) 08/09/2016   Mixed hyperlipidemia 10/23/2015   Type 2 diabetes mellitus without complication, without long-term current use of insulin (HCC) 10/23/2015   Essential hypertension 06/18/2015   H/O malignant gastrointestinal stromal tumor (GIST) 05/30/2015   Postherpetic neuralgia 04/17/2015   DDD (degenerative disc disease), lumbar 04/17/2015   Personal history of malignant neoplasm of breast 07/04/2013   History of colonic polyps     Past Surgical History:  Procedure Laterality Date   ABDOMINAL HYSTERECTOMY     APPENDECTOMY     BREAST BIOPSY Left 09/08/2016   Done by Dr. Evette Cristal   BREAST EXCISIONAL BIOPSY Right 1999   Mastectomy   BREAST SURGERY Right 1999   mastectomy   CATARACT EXTRACTION W/PHACO Left 04/30/2015   Procedure: CATARACT EXTRACTION PHACO AND INTRAOCULAR LENS PLACEMENT (IOC);  Surgeon:  Sallee Lange, MD;  Location: [facility] ORS;  Service: Ophthalmology;  Laterality: Left;  [place] 01:39 AP% 23.3 CDE 40.42 fluid pack lot # [id number]   COLONOSCOPY  2008   Sankar   EUS  02/12/2012   Procedure: UPPER ENDOSCOPIC ULTRASOUND (EUS) LINEAR;  Surgeon: Rachael Fee, MD;  Location:  [facility];  Service: Endoscopy;  Laterality: N/A;  radial linear   EYE SURGERY Right 2013   cataract   HERNIA REPAIR  2012   LAPAROTOMY  2013   excision of gastric wall mass    MASTECTOMY     SALPINGOOPHORECTOMY Right 1979   THYROIDECTOMY  1978   TONSILLECTOMY     TUBAL LIGATION     UPPER GI ENDOSCOPY  2013    Family History  Problem Relation Age of Onset   Cancer Mother        cervical   Cancer Father        prostate   Kidney Stones Brother    Glaucoma Brother    Prostatitis Brother    Diabetes Sister    Thyroid disease Brother    Cancer Brother        thyroid   Arthritis Brother    Diabetes Sister    Kidney disease Neg Hx    Bladder Cancer Neg Hx    Breast cancer Neg Hx     Social History   Tobacco Use   Smoking status: Never   Smokeless tobacco: Never  Substance Use Topics   Alcohol use: No     Current Outpatient Medications:    aspirin EC 81 MG tablet, Take 81 mg by mouth daily. Swallow whole., Disp: , Rfl:    calcium citrate-vitamin D 200-200 MG-UNIT TABS, Take 1 tablet by mouth daily., Disp: , Rfl:    Magnesium 100 MG CAPS, Take 1 capsule by mouth daily., Disp: , Rfl:    Multiple Vitamin (MULTIVITAMIN) tablet, Take 1 tablet by mouth daily., Disp: , Rfl:    sitaGLIPtin-metformin (JANUMET) 50-500 MG tablet, TAKE 1 TABLET BY MOUTH EVERY DAY, Disp: 90 tablet, Rfl: 0   thiamine (VITAMIN B-1) 100 MG tablet, Take 100 mg by mouth daily., Disp: , Rfl:    atorvastatin (LIPITOR) 80 MG tablet, TAKE 1 TABLET BY MOUTH EVERYDAY AT BEDTIME, Disp: 90 tablet, Rfl: 1   gabapentin (NEURONTIN) 300 MG capsule, Take 1 capsule (300 mg total) by mouth at bedtime., Disp: 90 capsule, Rfl: 1   loratadine (CLARITIN) 10 MG tablet, Take 1 tablet (10 mg total) by mouth daily., Disp: 90 tablet, Rfl: 3   omeprazole (PRILOSEC) 20 MG capsule, Take 1 capsule (20 mg total) by mouth daily as needed., Disp: 90 capsule, Rfl: 1  Allergies  Allergen Reactions   Fish Allergy Rash    Rash and  vomiting    I personally reviewed active problem list, medication list, allergies, family history with the patient/caregiver today.   ROS  Ten systems reviewed and is negative except as mentioned in HPI    Objective  Vitals:   10/02/23 1401  BP: 136/70  Pulse: 81  Resp: 16  Temp: 97.8 F (36.6 C)  TempSrc: Oral  SpO2: 90%  Weight: 114 lb (51.7 kg)  Height: 5\' 4"  (1.626 m)    Body mass index is 19.57 kg/m.  Physical Exam  Constitutional: Patient appears well-developed and well-nourished.  No distress.  HEENT: head atraumatic, normocephalic, pupils equal and reactive to light, neck supple Cardiovascular: Normal rate, regular rhythm and normal heart sounds.  No murmur  heard. No BLE edema. Pulmonary/Chest: Effort normal and breath sounds normal. No respiratory distress. Abdominal: Soft.  There is no tenderness. Psychiatric: Patient has a normal mood and affect. behavior is normal. Judgment and thought content normal.   Recent Results (from the past 2160 hours)  HM DIABETES EYE EXAM     Status: None   Collection Time: 07/27/23  3:40 PM  Result Value Ref Range   HM Diabetic Eye Exam No Retinopathy No Retinopathy    Comment: Abs by HIM  POCT glycosylated hemoglobin (Hb A1C)     Status: Abnormal   Collection Time: 10/02/23  2:05 PM  Result Value Ref Range   Hemoglobin A1C 7.3 (A) 4.0 - 5.6 %   HbA1c POC (<> result, manual entry)     HbA1c, POC (prediabetic range)     HbA1c, POC (controlled diabetic range)       PHQ2/9:    10/02/2023    2:00 PM 04/01/2023    1:47 PM 11/21/2022    1:22 PM 11/10/2022    9:52 AM 07/22/2022    2:14 PM  Depression screen PHQ 2/9  Decreased Interest 0 0 0 0 2  Down, Depressed, Hopeless 0 1 0 0 1  PHQ - 2 Score 0 1 0 0 3  Altered sleeping 0 0 0  0  Tired, decreased energy 0 0 0  0  Change in appetite 0 0 1  1  Feeling bad or failure about yourself  0 0 0  0  Trouble concentrating 0 0 0  0  Moving slowly or fidgety/restless 0 0 0  0   Suicidal thoughts 0 0 0  0  PHQ-9 Score 0 1 1  4   Difficult doing work/chores Not difficult at all    Not difficult at all    phq 9 is negative   Fall Risk:    10/02/2023    1:50 PM 04/01/2023    1:46 PM 11/21/2022    1:22 PM 11/10/2022    9:51 AM 07/22/2022    2:14 PM  Fall Risk   Falls in the past year? 0 0 0 0 1  Number falls in past yr: 0 0 0 0 0  Injury with Fall? 0 0 0 0 0  Risk for fall due to : No Fall Risks No Fall Risks No Fall Risks  History of fall(s)  Follow up Falls prevention discussed;Education provided;Falls evaluation completed Falls prevention discussed Falls prevention discussed Falls evaluation completed Falls evaluation completed    Assessment & Plan  Assessment and Plan    Peripheral Artery Disease Mild symptoms, able to walk without pain. Last seen by vascular specialist in January 2023. -Continue Atorvastatin and Aspirin as prescribed.  Type 2 Diabetes Well controlled with Janumet, recent highest reading 123. Associated dyslipidemia and neuropathy, latter managed with Gabapentin. -Continue current management and monitor blood glucose levels.  Allergic Rhinitis Reports runny nose, no congestion. Difficulty with current medication packaging. -Prescribe Loratadine tablets and [facility] to [facility].  Hyperlipidemia On Atorvastatin, recent issues with prescription refill. -Continue Atorvastatin, check cholesterol levels today.  Gastrointestinal Stromal Tumor (history) Treated and no current follow-up. No current gastrointestinal symptoms. -Continue current management, no further imaging recommended at this time.  Osteopenia Mild bone loss, low risk of fracture. Taking Vitamin D and maintaining high calcium diet. -Continue current management.  General Health Maintenance -Order comprehensive panel including sugar, kidney, liver function, and cholesterol levels. -Recommend Tdap vaccine, patient to get at local pharmacy. -Schedule follow-up in six  months or  sooner if needed. -Continue to monitor weight and ensure no further loss. -Advise against risky activities such as climbing into attic alone.

## 2023-09-25 ENCOUNTER — Other Ambulatory Visit: Payer: Self-pay | Admitting: Family Medicine

## 2023-09-25 ENCOUNTER — Ambulatory Visit
Admission: RE | Admit: 2023-09-25 | Discharge: 2023-09-25 | Disposition: A | Discharge status: Home / Self Care | Payer: Medicare Other | Source: Ambulatory Visit | Attending: Family Medicine | Admitting: Family Medicine

## 2023-09-25 DIAGNOSIS — Z1231 Encounter for screening mammogram for malignant neoplasm of breast: Secondary | ICD-10-CM

## 2023-10-02 ENCOUNTER — Ambulatory Visit: Payer: Medicare Other | Admitting: Family Medicine

## 2023-10-02 ENCOUNTER — Encounter: Payer: Self-pay | Admitting: Family Medicine

## 2023-10-02 VITALS — BP 136/70 | HR 81 | Temp 97.8°F | Resp 16 | Ht 64.0 in | Wt 114.0 lb

## 2023-10-02 DIAGNOSIS — M85852 Other specified disorders of bone density and structure, left thigh: Secondary | ICD-10-CM

## 2023-10-02 DIAGNOSIS — E782 Mixed hyperlipidemia: Secondary | ICD-10-CM

## 2023-10-02 DIAGNOSIS — J3489 Other specified disorders of nose and nasal sinuses: Secondary | ICD-10-CM

## 2023-10-02 DIAGNOSIS — I739 Peripheral vascular disease, unspecified: Secondary | ICD-10-CM | POA: Diagnosis not present

## 2023-10-02 DIAGNOSIS — E1142 Type 2 diabetes mellitus with diabetic polyneuropathy: Secondary | ICD-10-CM

## 2023-10-02 DIAGNOSIS — K219 Gastro-esophageal reflux disease without esophagitis: Secondary | ICD-10-CM

## 2023-10-02 DIAGNOSIS — Z7984 Long term (current) use of oral hypoglycemic drugs: Secondary | ICD-10-CM

## 2023-10-02 LAB — POCT GLYCOSYLATED HEMOGLOBIN (HGB A1C): Hemoglobin A1C: 7.3 % — AB (ref 4.0–5.6)

## 2023-10-02 MED ORDER — ATORVASTATIN CALCIUM 80 MG PO TABS: ORAL_TABLET | ORAL | 1 refills | Status: DC

## 2023-10-02 MED ORDER — LORATADINE 10 MG PO TABS: 10.0000 mg | ORAL_TABLET | Freq: Every day | ORAL | 3 refills | Status: DC

## 2023-10-02 MED ORDER — GABAPENTIN 300 MG PO CAPS: 300.0000 mg | ORAL_CAPSULE | Freq: Every day | ORAL | 1 refills | Status: DC

## 2023-10-02 MED ORDER — OMEPRAZOLE 20 MG PO CPDR: 20.0000 mg | DELAYED_RELEASE_CAPSULE | Freq: Every day | ORAL | 1 refills | Status: DC | PRN

## 2023-10-02 NOTE — Patient Instructions (Signed)
Tdap at local pharmacy.

## 2023-10-03 LAB — COMPLETE METABOLIC PANEL WITH GFR
AG Ratio: 1.7 (calc) (ref 1.0–2.5)
ALT: 14 U/L (ref 6–29)
AST: 21 U/L (ref 10–35)
Albumin: 4.4 g/dL (ref 3.6–5.1)
Alkaline phosphatase (APISO): 70 U/L (ref 37–153)
BUN: 22 mg/dL (ref 7–25)
CO2: 29 mmol/L (ref 20–32)
Calcium: 10.1 mg/dL (ref 8.6–10.4)
Chloride: 104 mmol/L (ref 98–110)
Creat: 0.91 mg/dL (ref 0.60–0.95)
Globulin: 2.6 g/dL (ref 1.9–3.7)
Glucose, Bld: 114 mg/dL — ABNORMAL HIGH (ref 65–99)
Potassium: 4.4 mmol/L (ref 3.5–5.3)
Sodium: 141 mmol/L (ref 135–146)
Total Bilirubin: 0.4 mg/dL (ref 0.2–1.2)
Total Protein: 7 g/dL (ref 6.1–8.1)
eGFR: 60 mL/min/{1.73_m2} (ref >=60)

## 2023-10-03 LAB — LIPID PANEL
Cholesterol: 162 mg/dL (ref <200)
HDL: 55 mg/dL (ref >=50)
LDL Cholesterol (Calc): 90 mg/dL
Non-HDL Cholesterol (Calc): 107 mg/dL (ref <130)
Total CHOL/HDL Ratio: 2.9 (calc) (ref <5.0)
Triglycerides: 77 mg/dL (ref <150)

## 2023-10-23 ENCOUNTER — Encounter: Payer: Self-pay | Admitting: Podiatry

## 2023-10-23 ENCOUNTER — Ambulatory Visit: Payer: Medicare Other | Admitting: Podiatry

## 2023-10-23 VITALS — Ht 64.0 in | Wt 114.0 lb

## 2023-10-23 DIAGNOSIS — M79674 Pain in right toe(s): Secondary | ICD-10-CM | POA: Diagnosis not present

## 2023-10-23 DIAGNOSIS — L84 Corns and callosities: Secondary | ICD-10-CM

## 2023-10-23 DIAGNOSIS — M79675 Pain in left toe(s): Secondary | ICD-10-CM | POA: Diagnosis not present

## 2023-10-23 DIAGNOSIS — E1151 Type 2 diabetes mellitus with diabetic peripheral angiopathy without gangrene: Secondary | ICD-10-CM

## 2023-10-23 DIAGNOSIS — B351 Tinea unguium: Secondary | ICD-10-CM | POA: Diagnosis not present

## 2023-10-23 NOTE — Progress Notes (Signed)
  Subjective:  Patient ID: Rachael Castro, female    DOB: [birth year],  MRN: [id number]  89 y.o. female presents to clinic with  at risk foot care. Pt has h/o NIDDM with PAD and corn(s) of both feet, callus(es) of both feet and painful mycotic nails.  Pain interferes with ambulation. Aggravating factors include wearing enclosed shoe gear. Painful toenails interfere with ambulation. Aggravating factors include wearing enclosed shoe gear. Pain is relieved with periodic professional debridement. Painful corns and calluses are aggravated when weightbearing with and without shoegear. Pain is relieved with periodic professional debridement.  Chief Complaint  Patient presents with   Nail Problem    RM2: RFC   New problem(s): None   PCP is Sowles, Krichna, MD.  Allergies  Allergen Reactions   Fish Allergy Rash    Rash and vomiting    Review of Systems: Negative except as noted in the HPI.   Objective:  Rachael Castro is a pleasant 89 y.o. female WD, WN in NAD.SABRA AAO x 3.  Vascular Examination: Faintly palpable pedal pulses. CFT <3 seconds b/l. No edema. No pain with calf compression b/l. Skin temperature gradient WNL b/l. No cyanosis or clubbing noted b/l LE.  Neurological Examination: Sensation grossly intact b/l with 10 gram monofilament. Vibratory sensation intact b/l.   Dermatological Examination: Pedal skin with normal turgor, texture and tone b/l. Toenails 1-5 b/l thick, discolored, elongated with subungual debris and pain on dorsal palpation.  Hyperkeratotic lesion(s) left great toe, left fifth digit, right great toe, right fifth digit, and submet head 5 b/l.  No erythema, no edema, no drainage, no fluctuance.  Musculoskeletal Examination: Muscle strength 5/5 to b/l LE. HAV with bunion deformity noted b/l LE. Hammertoe deformity noted 2-5 b/l.  Radiographs: None  Last A1c:      Latest Ref Rng & Units 10/02/2023    2:05 PM 04/01/2023    1:53 PM 11/21/2022    1:24 PM   Hemoglobin A1C  Hemoglobin-A1c 4.0 - 5.6 % 7.3  7.0  7.0      Assessment:   1. Pain due to onychomycosis of toenails of both feet   2. Corns and callosities   3. Type II diabetes mellitus with peripheral circulatory disorder [facility])    -Patient was evaluated today. All questions/concerns addressed on today's visit. -Patient to continue soft, supportive shoe gear daily. -Toenails 1-5 b/l were debrided in length and girth with sterile nail nippers and dremel without iatrogenic bleeding.  -Corn(s) bilateral 5th toes and callus(es) left great toe, right great toe, and submet head 5 b/l were pared utilizing sterile scalpel blade without incident. Total number debrided =6. -Patient/POA to call should there be question/concern in the interim.  Return in about 3 months (around 01/21/2024).  Rachael Castro, DPM      [place] LOCATION: [address] [place] [address]                   Office [phone]   [facility] LOCATION: [address] [place], [place] [address] Office [phone]

## 2023-10-29 ENCOUNTER — Encounter: Payer: Self-pay | Admitting: Podiatry

## 2023-11-12 ENCOUNTER — Ambulatory Visit (INDEPENDENT_AMBULATORY_CARE_PROVIDER_SITE_OTHER): Payer: Medicare Other

## 2023-11-12 ENCOUNTER — Ambulatory Visit: Payer: Medicare Other

## 2023-11-12 DIAGNOSIS — Z0000 Encounter for general adult medical examination without abnormal findings: Secondary | ICD-10-CM

## 2023-11-12 NOTE — Patient Instructions (Signed)
Rachael Castro , Thank you for taking time to come for your Medicare Wellness Visit. I appreciate your ongoing commitment to your health goals. Please review the following plan we discussed and let me know if I can assist you in the future.   Referrals/Orders/Follow-Ups/Clinician Recommendations: NONE  This is a list of the screening recommended for you and due dates:  Health Maintenance  Topic Date Due   COVID-19 Vaccine (7 - 2024-25 season) 06/21/2023   DTaP/Tdap/Td vaccine (1 - Tdap) 10/01/2024*   Complete foot exam   03/31/2024   Hemoglobin A1C  04/01/2024   Eye exam for diabetics  07/26/2024   Mammogram  09/24/2024   Medicare Annual Wellness Visit  11/11/2024   DEXA scan (bone density measurement)  06/14/2025   Pneumonia Vaccine  Completed   Flu Shot  Completed   Zoster (Shingles) Vaccine  Completed   HPV Vaccine  Aged Out   Colon Cancer Screening  Discontinued  *Topic was postponed. The date shown is not the original due date.    Advanced directives: (ACP Link)Information on Advanced Care Planning can be found at [facility] Advance Health Care Directives Advance Health Care Directives ([email or web address]   Next Medicare Annual Wellness Visit scheduled for next year: Yes   11/17/24 @ 9:30 AM BY PHONE

## 2023-11-12 NOTE — Progress Notes (Signed)
Subjective:   Rachael Castro is a 89 y.o. female who presents for Medicare Annual (Subsequent) preventive examination.  Visit Complete: Virtual I connected with  Janett Billow on 11/12/23 by a audio enabled telemedicine application and verified that I am speaking with the correct person using two identifiers.  This patient declined Interactive audio and Acupuncturist. Therefore the visit was completed with audio only.   Patient Location: Home  Provider Location: Office/Clinic  I discussed the limitations of evaluation and management by telemedicine. The patient expressed understanding and agreed to proceed.  Vital Signs: Because this visit was a virtual/telehealth visit, some criteria may be missing or patient reported. Any vitals not documented were not able to be obtained and vitals that have been documented are patient reported.  Cardiac Risk Factors include: advanced age (>42men, >65 women);diabetes mellitus;dyslipidemia;hypertension;sedentary lifestyle     Objective:    There were no vitals filed for this visit. There is no height or weight on file to calculate BMI.     11/12/2023    9:37 AM 12/05/2022    2:37 PM 11/10/2022   10:31 AM 11/05/2021   10:14 AM 11/01/2020   10:17 AM 10/28/2019   10:18 AM 09/28/2019    1:24 PM  Advanced Directives  Does Patient Have a Medical Advance Directive? No Yes Yes Yes Yes Yes No  Type of Special educational needs teacher of [place];Living will Healthcare Power of [employer] of [place];Living will Healthcare Power of [place];Living will Healthcare Power of [place];Living will   Copy of Healthcare Power of Attorney in Chart?   No - copy requested No - copy requested No - copy requested No - copy requested   Would patient like information on creating a medical advance directive? No - Patient declined      No - Patient declined    Current Medications (verified) Outpatient Encounter Medications as of  11/12/2023  Medication Sig   atorvastatin (LIPITOR) 80 MG tablet TAKE 1 TABLET BY MOUTH EVERYDAY AT BEDTIME   calcium citrate-vitamin D 200-200 MG-UNIT TABS Take 1 tablet by mouth daily.   gabapentin (NEURONTIN) 300 MG capsule Take 1 capsule (300 mg total) by mouth at bedtime.   loratadine (CLARITIN) 10 MG tablet Take 1 tablet (10 mg total) by mouth daily.   Magnesium 100 MG CAPS Take 1 capsule by mouth daily.   Multiple Vitamin (MULTIVITAMIN) tablet Take 1 tablet by mouth daily.   omeprazole (PRILOSEC) 20 MG capsule Take 1 capsule (20 mg total) by mouth daily as needed.   sitaGLIPtin-metformin (JANUMET) 50-500 MG tablet TAKE 1 TABLET BY MOUTH EVERY DAY   thiamine (VITAMIN B-1) 100 MG tablet Take 100 mg by mouth daily.   aspirin EC 81 MG tablet Take 81 mg by mouth daily. Swallow whole. (Patient not taking: Reported on 11/12/2023)   No facility-administered encounter medications on file as of 11/12/2023.    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 Evette Cristal   BREAST EXCISIONAL BIOPSY Right 1999   Mastectomy   BREAST SURGERY Right 1999   mastectomy   CATARACT EXTRACTION W/PHACO Left 04/30/2015   Procedure: CATARACT EXTRACTION PHACO AND INTRAOCULAR LENS PLACEMENT (IOC);  Surgeon: Sallee Lange, MD;  Location: [facility] ORS;  Service:  Ophthalmology;  Laterality: Left;  [place] 01:39 AP% 23.3 CDE 40.42 fluid pack lot # [id number]   COLONOSCOPY  2008   Sankar   EUS  02/12/2012   Procedure: UPPER ENDOSCOPIC ULTRASOUND (EUS) LINEAR;  Surgeon: Rachael Fee, MD;  Location: [facility];  Service: Endoscopy;  Laterality: N/A;  radial linear   EYE SURGERY Right 2013   cataract   HERNIA REPAIR  2012   LAPAROTOMY  2013   excision of gastric wall mass    MASTECTOMY     SALPINGOOPHORECTOMY Right 1979   THYROIDECTOMY  1978   TONSILLECTOMY     TUBAL LIGATION     UPPER GI ENDOSCOPY  2013   Family History  Problem Relation Age of Onset   Cancer Mother        cervical   Cancer Father        prostate   Kidney Stones Brother    Glaucoma Brother    Prostatitis Brother    Diabetes Sister    Thyroid disease Brother    Cancer Brother        thyroid   Arthritis Brother    Diabetes Sister    Kidney disease Neg Hx    Bladder Cancer Neg Hx    Breast cancer Neg Hx    Social History   Socioeconomic History   Marital status: Widowed    Spouse name: Not on file   Number of children: 1   Years of education: Not on file   Highest education level: High school graduate  Occupational History   Not on file  Tobacco Use   Smoking status: Never   Smokeless tobacco: Never  Vaping Use   Vaping status: Never Used  Substance and Sexual Activity   Alcohol use: No   Drug use: No   Sexual activity: Not Currently  Other Topics Concern   Not on file  Social History Narrative   Primary caregiver to husband who had a stroke in Aug 2021   11/11/22   Patient husband passed in October 2023. Lives alone. Daughter takes care of   Social Drivers of Health   Financial Resource Strain: Low Risk  (11/12/2023)   Overall Financial Resource Strain (CARDIA)    Difficulty of Paying Living Expenses: Not very hard  Food Insecurity: No Food Insecurity (11/12/2023)   Hunger Vital Sign    Worried About Running Out of Food in the Last Year: Never true     Ran Out of Food in the Last Year: Never true  Transportation Needs: No Transportation Needs (11/12/2023)   PRAPARE - Administrator, Civil Service (Medical): No    Lack of Transportation (Non-Medical): No  Physical Activity: Insufficiently Active (11/12/2023)   Exercise Vital Sign    Days of Exercise per Week: 2 days    Minutes of Exercise per Session: 20 min  Stress: No Stress Concern Present (11/12/2023)   [employer] of Occupational Health - Occupational Stress Questionnaire  Feeling of Stress : Not at all  Social Connections: Moderately Isolated (11/12/2023)   Social Connection and Isolation Panel [NHANES]    Frequency of Communication with Friends and Family: More than three times a week    Frequency of Social Gatherings with Friends and Family: More than three times a week    Attends Religious Services: More than 4 times per year    Active Member of [employer] or Organizations: No    Attends Banker Meetings: Never    Marital Status: Widowed    Tobacco Counseling Counseling given: Not Answered   Clinical Intake:  Pre-visit preparation completed: Yes  Pain : No/denies pain     BMI - recorded: 23.2 Nutritional Status: BMI > 30  Obese Nutritional Risks: None Diabetes: Yes CBG done?: No Did pt. bring in CBG monitor from home?: No  How often do you need to have someone help you when you read instructions, pamphlets, or other written materials from your doctor or pharmacy?: 1 - Never  Interpreter Needed?: No  Information entered by :: Kennedy Bucker, LPN   Activities of Daily Living    11/12/2023    9:38 AM 04/01/2023    1:47 PM  In your present state of health, do you have any difficulty performing the following activities:  Hearing? 1 0  Vision? 0 0  Difficulty concentrating or making decisions? 0 0  Walking or climbing stairs? 0 1  Dressing or bathing? 0 0  Doing errands, shopping? 0 0  Preparing Food and eating ? N   Using the  Toilet? N   In the past six months, have you accidently leaked urine? Y   Do you have problems with loss of bowel control? N   Managing your Medications? N   Managing your Finances? N   Housekeeping or managing your Housekeeping? N     Patient Care Team: Alba Cory, MD as PCP - General (Family Medicine) Helane Gunther, DPM as Consulting Physician (Podiatry) Wyn Quaker, Marlow Baars, MD as Referring Physician (Vascular Surgery) Dingeldein, Viviann Spare, MD (Ophthalmology)  Indicate any recent Medical Services you may have received from other than [facility] providers in the past year (date may be approximate).     Assessment:   This is a routine wellness examination for [place].  Hearing/Vision screen Hearing Screening - Comments:: NO AIDS Vision Screening - Comments:: WEARS GLASSES ALL THE TIME- DR.DINGELDEIN   Goals Addressed             This Visit's Progress    DIET - EAT MORE FRUITS AND VEGETABLES         Depression Screen    11/12/2023    9:34 AM 10/02/2023    2:00 PM 04/01/2023    1:47 PM 11/21/2022    1:22 PM 11/10/2022    9:52 AM 07/22/2022    2:14 PM 12/09/2021    1:16 PM  PHQ 2/9 Scores  PHQ - 2 Score 2 0 1 0 0 3 0  PHQ- 9 Score 3 0 1 1  4      Fall Risk    11/12/2023    9:38 AM 10/02/2023    1:50 PM 04/01/2023    1:46 PM 11/21/2022    1:22 PM 11/10/2022    9:51 AM  Fall Risk   Falls in the past year? 0 0 0 0 0  Number falls in past yr: 0 0 0 0 0  Injury with Fall? 0 0 0 0 0  Risk for fall due to :  No Fall Risks No Fall Risks No Fall Risks No Fall Risks   Follow up Falls prevention discussed;Falls evaluation completed Falls prevention discussed;Education provided;Falls evaluation completed Falls prevention discussed Falls prevention discussed Falls evaluation completed    MEDICARE RISK AT HOME: Medicare Risk at Home Any stairs in or around the home?: Yes If so, are there any without handrails?: Yes Home free of loose throw rugs in walkways, pet beds, electrical cords,  etc?: Yes Adequate lighting in your home to reduce risk of falls?: Yes Life alert?: No Use of a cane, walker or w/c?: No Grab bars in the bathroom?: Yes Shower chair or bench in shower?: Yes Elevated toilet seat or a handicapped toilet?: No  TIMED UP AND GO:  Was the test performed?  No    Cognitive Function:        11/12/2023    9:45 AM 11/12/2023    9:40 AM 11/10/2022    9:52 AM 11/01/2020   10:21 AM 10/28/2019   10:21 AM  6CIT Screen  What Year?  0 points 0 points 0 points 0 points  What month?  0 points 0 points 0 points 0 points  What time?  0 points 0 points 0 points 0 points  Count back from 20 0 points  0 points 0 points 0 points  Months in reverse 0 points  0 points 0 points 0 points  Repeat phrase 0 points  0 points 0 points 2 points  Total Score   0 points 0 points 2 points    Immunizations Immunization History  Administered Date(s) Administered   Fluad Quad(high Dose 65+) 06/10/2019, 07/12/2020, 07/22/2022   Fluad Trivalent(High Dose 65+) 07/29/2023   Influenza, High Dose Seasonal PF 06/18/2015, 09/04/2016   Influenza-Unspecified 07/23/2017, 06/02/2018   PFIZER(Purple Top)SARS-COV-2 Vaccination 11/14/2019, 12/05/2019, 07/16/2020, 03/04/2021   PNEUMOCOCCAL CONJUGATE-20 11/10/2022   Pfizer Covid-19 Vaccine Bivalent Booster 17yrs & up 08/02/2021   Pfizer(Comirnaty)Fall Seasonal Vaccine 12 years and older 10/07/2022   Zoster Recombinant(Shingrix) 01/05/2019, 02/22/2020    TDAP status: Due, Education has been provided regarding the importance of this vaccine. Advised may receive this vaccine at local pharmacy or Health Dept. Aware to provide a copy of the vaccination record if obtained from local pharmacy or Health Dept. Verbalized acceptance and understanding.  Flu Vaccine status: Up to date  Pneumococcal vaccine status: Up to date  Covid-19 vaccine status: Completed vaccines  Qualifies for Shingles Vaccine? Yes   Zostavax completed No   Shingrix Completed?:  Yes  Screening Tests Health Maintenance  Topic Date Due   COVID-19 Vaccine (7 - 2024-25 season) 06/21/2023   DTaP/Tdap/Td (1 - Tdap) 10/01/2024 (Originally 08/23/1950)   FOOT EXAM  03/31/2024   HEMOGLOBIN A1C  04/01/2024   OPHTHALMOLOGY EXAM  07/26/2024   MAMMOGRAM  09/24/2024   Medicare Annual Wellness (AWV)  11/11/2024   DEXA SCAN  06/14/2025   Pneumonia Vaccine 44+ Years old  Completed   INFLUENZA VACCINE  Completed   Zoster Vaccines- Shingrix  Completed   HPV VACCINES  Aged Out   Colonoscopy  Discontinued    Health Maintenance  Health Maintenance Due  Topic Date Due   COVID-19 Vaccine (7 - 2024-25 season) 06/21/2023    Colorectal cancer screening: No longer required.   Mammogram status: Completed 09/25/23. Repeat every year  Bone Density status: Completed 06/15/23. Results reflect: Bone density results: OSTEOPENIA. Repeat every 5 years.  Lung Cancer Screening: (Low Dose CT Chest recommended if Age 66-80 years, 20 pack-year currently smoking OR  have quit w/in 15years.) does not qualify.    Additional Screening:  Hepatitis C Screening: does not qualify; Completed NO  Vision Screening: Recommended annual ophthalmology exams for early detection of glaucoma and other disorders of the eye. Is the patient up to date with their annual eye exam?  Yes  Who is the provider or what is the name of the office in which the patient attends annual eye exams? DR.DINGELDEIN If pt is not established with a provider, would they like to be referred to a provider to establish care? No .   Dental Screening: Recommended annual dental exams for proper oral hygiene  Diabetic Foot Exam: Diabetic Foot Exam: Completed 04/01/23  Community Resource Referral / Chronic Care Management: CRR required this visit?  No   CCM required this visit?  No     Plan:     I have personally reviewed and noted the following in the patient's chart:   Medical and social history Use of alcohol, tobacco or  illicit drugs  Current medications and supplements including opioid prescriptions. Patient is not currently taking opioid prescriptions. Functional ability and status Nutritional status Physical activity Advanced directives List of other physicians Hospitalizations, surgeries, and ER visits in previous 12 months Vitals Screenings to include cognitive, depression, and falls Referrals and appointments  In addition, I have reviewed and discussed with patient certain preventive protocols, quality metrics, and best practice recommendations. A written personalized care plan for preventive services as well as general preventive health recommendations were provided to patient.     Hal Hope, LPN   04/15/349   After Visit Summary: (MyChart) Due to this being a telephonic visit, the after visit summary with patients personalized plan was offered to patient via MyChart   Nurse Notes: NONE

## 2023-11-13 ENCOUNTER — Ambulatory Visit: Payer: Medicare Other

## 2024-01-21 ENCOUNTER — Ambulatory Visit: Payer: Medicare Other | Admitting: Podiatry

## 2024-01-21 ENCOUNTER — Encounter: Payer: Self-pay | Admitting: Podiatry

## 2024-01-21 DIAGNOSIS — M79675 Pain in left toe(s): Secondary | ICD-10-CM

## 2024-01-21 DIAGNOSIS — E1151 Type 2 diabetes mellitus with diabetic peripheral angiopathy without gangrene: Secondary | ICD-10-CM

## 2024-01-21 DIAGNOSIS — M2011 Hallux valgus (acquired), right foot: Secondary | ICD-10-CM

## 2024-01-21 DIAGNOSIS — B351 Tinea unguium: Secondary | ICD-10-CM

## 2024-01-21 DIAGNOSIS — M79674 Pain in right toe(s): Secondary | ICD-10-CM | POA: Diagnosis not present

## 2024-01-21 DIAGNOSIS — Q828 Other specified congenital malformations of skin: Secondary | ICD-10-CM | POA: Diagnosis not present

## 2024-01-21 DIAGNOSIS — M2012 Hallux valgus (acquired), left foot: Secondary | ICD-10-CM | POA: Diagnosis not present

## 2024-01-21 DIAGNOSIS — M2041 Other hammer toe(s) (acquired), right foot: Secondary | ICD-10-CM | POA: Diagnosis not present

## 2024-01-21 DIAGNOSIS — L84 Corns and callosities: Secondary | ICD-10-CM

## 2024-01-21 LAB — HM DIABETES FOOT EXAM

## 2024-01-21 NOTE — Progress Notes (Signed)
 Subjective:  Patient ID: Rachael Castro, female    DOB: 05-14-1931,  MRN: [id number]  Rachael Castro presents to clinic today for at risk foot care. Pt has h/o NIDDM with PAD and corn(s) of both feet, callus(es) of both feet and painful elongated, discolored, dystrophic nails.  Pain interferes with ambulation. Aggravating factors include wearing enclosed shoe gear. Painful toenails interfere with ambulation. Aggravating factors include wearing enclosed shoe gear. Pain is relieved with periodic professional debridement. Painful corns and calluses are aggravated when weightbearing with and without shoegear. Pain is relieved with periodic professional debridement.  Chief Complaint  Patient presents with   Diabetes    "My toenails and the calluses"  Dr. Carlynn Purl - 10/02/2023; Alc - 7.3   New problem(s): None.   PCP is Alba Cory, MD.  Allergies  Allergen Reactions   Fish Allergy Rash    Rash and vomiting    Review of Systems: Negative except as noted in the HPI.  Objective: No changes noted in today's physical examination. There were no vitals filed for this visit. Rachael Castro is a pleasant 89 y.o. female thin build in NAD. AAO x 3.   Vascular Examination: CFT <3 seconds b/l. DP/PT pulses faintly palpable b/l. Skin temperature gradient warm to warm b/l. No pain with calf compression. No ischemia or gangrene. No cyanosis or clubbing noted b/l. Pedal hair absent.   Neurological Examination: Sensation grossly intact b/l with 10 gram monofilament. Vibratory sensation intact b/l.   Dermatological Examination: Pedal skin warm and supple b/l.   No open wounds. No interdigital macerations.  Toenails 1-5 b/l thick, discolored, elongated with subungual debris and pain on dorsal palpation.    Hyperkeratotic lesion(s) bilateral great toes and submet head 5 b/l.  No erythema, no edema, no drainage, no fluctuance.  Musculoskeletal Examination: Muscle strength 5/5 to all lower  extremity muscle groups bilaterally. HAV with bunion bilaterally and hammertoes 2-5 b/l.  Radiographs: None  Last A1c:      Latest Ref Rng & Units 10/02/2023    2:05 PM 04/01/2023    1:53 PM  Hemoglobin A1C  Hemoglobin-A1c 4.0 - 5.6 % 7.3  7.0    Assessment/Plan: 1. Pain due to onychomycosis of toenails of both feet   2. Callus of foot   3. Hallux valgus, acquired, bilateral   4. Acquired hammertoes of both feet   5. Type II diabetes mellitus with peripheral circulatory disorder (HCC)     No orders of the defined types were placed in this encounter.  Orders Placed This Encounter  Procedures   For Home Use Only DME Diabetic Shoe    Dispense one pair extra depth shoes and 3 pair total contact insoles. Offload calluses submet head 5 bilaterally.  FOR HOME USE ONLY DME DIABETIC SHOE -Consent given for treatment. Patient examined. All patient's and/or POA's questions/concerns addressed on today's visit.  -Patient to schedule appointment with Pedorthist for diabetic shoe measurements. -Discussed diabetic shoe benefit available based on patient's diagnoses. Patient/POA would like to proceed. Order entered for one pair extra depth shoes and 3 pair total contact insoles. Patient qualifies based on diagnoses. -Patient/POA to call should there be question/concern in the interim. -Mycotic toenails 1-5 debrided in length and girth without incident. Callus(es) bilateral great toes and submet head 5 b/l pared with sharp debridement without incident. Continue foot and shoe inspections daily. Monitor blood glucose per PCP/Endocrinologist's recommendations.Continue soft, supportive shoe gear daily. Report any pedal injuries to medical professional. Call office if there  are any quesitons/concerns. -Patient/POA to call should there be question/concern in the interim.   Return in about 3 months (around 04/21/2024).  Freddie Breech, DPM      [place] LOCATION: [address], [place] [address]                   Office [phone]   [facility] LOCATION: [address] [place], [place] [address] Office [phone]

## 2024-01-22 ENCOUNTER — Other Ambulatory Visit: Payer: Self-pay | Admitting: Family Medicine

## 2024-01-22 DIAGNOSIS — E119 Type 2 diabetes mellitus without complications: Secondary | ICD-10-CM

## 2024-02-17 ENCOUNTER — Other Ambulatory Visit: Payer: Self-pay | Admitting: Family Medicine

## 2024-02-17 DIAGNOSIS — E119 Type 2 diabetes mellitus without complications: Secondary | ICD-10-CM

## 2024-02-18 ENCOUNTER — Other Ambulatory Visit: Payer: Self-pay | Admitting: Family Medicine

## 2024-02-18 DIAGNOSIS — E119 Type 2 diabetes mellitus without complications: Secondary | ICD-10-CM

## 2024-04-01 ENCOUNTER — Encounter: Payer: Self-pay | Admitting: Family Medicine

## 2024-04-01 ENCOUNTER — Ambulatory Visit: Payer: Self-pay | Admitting: Family Medicine

## 2024-04-01 VITALS — BP 136/72 | HR 67 | Resp 16 | Ht 64.0 in | Wt 115.0 lb

## 2024-04-01 DIAGNOSIS — K219 Gastro-esophageal reflux disease without esophagitis: Secondary | ICD-10-CM

## 2024-04-01 DIAGNOSIS — E1142 Type 2 diabetes mellitus with diabetic polyneuropathy: Secondary | ICD-10-CM | POA: Diagnosis not present

## 2024-04-01 DIAGNOSIS — G2581 Restless legs syndrome: Secondary | ICD-10-CM

## 2024-04-01 DIAGNOSIS — Z8509 Personal history of malignant neoplasm of other digestive organs: Secondary | ICD-10-CM

## 2024-04-01 DIAGNOSIS — E782 Mixed hyperlipidemia: Secondary | ICD-10-CM

## 2024-04-01 DIAGNOSIS — I739 Peripheral vascular disease, unspecified: Secondary | ICD-10-CM

## 2024-04-01 DIAGNOSIS — Z1231 Encounter for screening mammogram for malignant neoplasm of breast: Secondary | ICD-10-CM

## 2024-04-01 DIAGNOSIS — D649 Anemia, unspecified: Secondary | ICD-10-CM

## 2024-04-01 DIAGNOSIS — M85852 Other specified disorders of bone density and structure, left thigh: Secondary | ICD-10-CM

## 2024-04-01 DIAGNOSIS — E119 Type 2 diabetes mellitus without complications: Secondary | ICD-10-CM

## 2024-04-01 DIAGNOSIS — R790 Abnormal level of blood mineral: Secondary | ICD-10-CM

## 2024-04-01 DIAGNOSIS — Z7984 Long term (current) use of oral hypoglycemic drugs: Secondary | ICD-10-CM

## 2024-04-01 DIAGNOSIS — E519 Thiamine deficiency, unspecified: Secondary | ICD-10-CM

## 2024-04-01 LAB — POCT GLYCOSYLATED HEMOGLOBIN (HGB A1C): Hemoglobin A1C: 6.8 % — AB (ref 4.0–5.6)

## 2024-04-01 MED ORDER — OMEPRAZOLE 20 MG PO CPDR: 20.0000 mg | DELAYED_RELEASE_CAPSULE | Freq: Every day | ORAL | 1 refills | Status: DC | PRN

## 2024-04-01 MED ORDER — ATORVASTATIN CALCIUM 80 MG PO TABS: ORAL_TABLET | ORAL | 1 refills | Status: DC

## 2024-04-01 MED ORDER — GABAPENTIN 300 MG PO CAPS: 300.0000 mg | ORAL_CAPSULE | Freq: Every day | ORAL | 1 refills | Status: DC

## 2024-04-01 MED ORDER — JANUMET 50-500 MG PO TABS: 1.0000 | ORAL_TABLET | Freq: Every day | ORAL | 1 refills | Status: DC

## 2024-04-01 NOTE — Progress Notes (Signed)
 Name: Rachael Castro   MRN: [id number]    DOB: [birth year]   Date:04/01/2024       Progress Note  Subjective  Chief Complaint  Chief Complaint  Patient presents with   Medical Management of Chronic Issues   HPI   PAD: she was seen by Dr. Vonna Guardian 10/2021, studies showed mild disease, she has mild symptoms and given reassurance and continue current medications. She denies pain when walking, she walks most days to her neighbors house without having to stop , about 15 minutes    DM type II with dyslipidemia, PAD, and neuropathy: she is currently taking Janumet  and A1C  is down from 7.3 % to 6.8 % . She states no po.lyphagia, polydipsia or polyuria.  She takes gabapentin   for neuropathy - she has itching and tingling sensation on legs intermittently . She sees podiatrist for callus on her feet . Taking statin therapy for dyslipidemia and denies side effects.    History of breast cancer - right side in 1999 s/p mastectomy : mammogram is up to date, per oncologist repeat yearly on left side    History of gastric stromal tumor in 2013, last visit with oncologist was Feb 2024 and released from their care, due to normocytic anemia Dr. Randy Buttery asked to monitor CBC yearly and send her back if needed. Taking PPI and denies epigastric pain or nausea.  She has noticed some loose stools a couple times a month, no blood or mucus, no abdominal pain, weight is stable, she does not want to go back for evaluation now    Osteopenia: last bone density was 2024, taking vitamin D  supplementation   RLS and low magnesium  , doing well lately , states leg cramps have improved.   Lives alone, still independent, still drives short distances like grocery  stores , today a relative brought her in.   Discussed safety and to have someone that calls her in am and at night   She has a medical alert   Patient Active Problem List   Diagnosis Date Noted   Pain due to onychomycosis of toenails of both feet 09/05/2021   PAD  (peripheral artery disease) (HCC) 03/29/2021   Plantar flexed metatarsal bone of left foot 02/21/2021   Plantar flexed metatarsal bone of right foot 02/21/2021   Clavi 02/21/2021   Callus of foot 01/25/2021   Restless leg syndrome 01/25/2021   Gastroesophageal reflux disease 01/25/2021   Normocytic anemia 10/18/2020   Impingement syndrome of shoulder region 05/16/2019   Osteopenia 12/24/2017   Hypomagnesemia 09/01/2016   Mixed hyperlipidemia 10/23/2015   Type 2 diabetes mellitus without complication, without long-term current use of insulin (HCC) 10/23/2015   Essential hypertension 06/18/2015   H/O malignant gastrointestinal stromal tumor (GIST) 05/30/2015   Postherpetic neuralgia 04/17/2015   DDD (degenerative disc disease), lumbar 04/17/2015   Personal history of malignant neoplasm of breast 07/04/2013   History of colonic polyps     Past Surgical History:  Procedure Laterality Date   ABDOMINAL HYSTERECTOMY     APPENDECTOMY     BREAST BIOPSY Left 09/08/2016   Done by Dr. Lorel Roes   BREAST EXCISIONAL BIOPSY Right 1999   Mastectomy   BREAST SURGERY Right 1999   mastectomy   CATARACT EXTRACTION W/PHACO Left 04/30/2015   Procedure: CATARACT EXTRACTION PHACO AND INTRAOCULAR LENS PLACEMENT (IOC);  Surgeon: Steven Dingeldein, MD;  Location: [facility];  Service: Ophthalmology;  Laterality: Left;  US  01:39 AP% 23.3 CDE 40.42 fluid pack lot # [id number]  COLONOSCOPY  2008   Sankar   EUS  02/12/2012   Procedure: UPPER ENDOSCOPIC ULTRASOUND (EUS) LINEAR;  Surgeon: Janel Medford, MD;  Location: [facility];  Service: Endoscopy;  Laterality: N/A;  radial linear   EYE SURGERY Right 2013   cataract   HERNIA REPAIR  2012   LAPAROTOMY  2013   excision of gastric wall mass    MASTECTOMY     SALPINGOOPHORECTOMY Right 1979   THYROIDECTOMY  1978   TONSILLECTOMY     TUBAL LIGATION     UPPER GI ENDOSCOPY  2013    Family History  Problem Relation Age of Onset   Cancer Mother         cervical   Cancer Father        prostate   Kidney Stones Brother    Glaucoma Brother    Prostatitis Brother    Diabetes Sister    Thyroid  disease Brother    Cancer Brother        thyroid    Arthritis Brother    Diabetes Sister    Kidney disease Neg Hx    Bladder Cancer Neg Hx    Breast cancer Neg Hx     Social History   Tobacco Use   Smoking status: Never   Smokeless tobacco: Never  Substance Use Topics   Alcohol use: No     Current Outpatient Medications:    atorvastatin  (LIPITOR) 80 MG tablet, TAKE 1 TABLET BY MOUTH EVERYDAY AT BEDTIME, Disp: 90 tablet, Rfl: 1   calcium  citrate-vitamin D  200-200 MG-UNIT TABS, Take 1 tablet by mouth daily., Disp: , Rfl:    gabapentin  (NEURONTIN ) 300 MG capsule, Take 1 capsule (300 mg total) by mouth at bedtime., Disp: 90 capsule, Rfl: 1   JANUMET  50-500 MG tablet, TAKE 1 TABLET BY MOUTH EVERY DAY, Disp: 90 tablet, Rfl: 0   loratadine  (CLARITIN ) 10 MG tablet, Take 1 tablet (10 mg total) by mouth daily., Disp: 90 tablet, Rfl: 3   Magnesium  100 MG CAPS, Take 1 capsule by mouth daily., Disp: , Rfl:    Multiple Vitamin (MULTIVITAMIN) tablet, Take 1 tablet by mouth daily., Disp: , Rfl:    omeprazole  (PRILOSEC) 20 MG capsule, Take 1 capsule (20 mg total) by mouth daily as needed., Disp: 90 capsule, Rfl: 1   thiamine (VITAMIN B-1) 100 MG tablet, Take 100 mg by mouth daily., Disp: , Rfl:    aspirin EC 81 MG tablet, Take 81 mg by mouth daily. Swallow whole. (Patient not taking: Reported on 04/01/2024), Disp: , Rfl:   Allergies  Allergen Reactions   Fish Allergy Rash    Rash and vomiting    I personally reviewed active problem list, medication list, allergies with the patient/caregiver today.   ROS  Ten systems reviewed and is negative except as mentioned in HPI    Objective Physical Exam Constitutional: Patient appears well-developed and well-nourished.  No distress.  HEENT: head atraumatic, normocephalic, pupils equal and reactive to  light, neck supple, throat within normal limits Cardiovascular: Normal rate, regular rhythm and normal heart sounds.  No murmur heard. No BLE edema. Pulmonary/Chest: Effort normal and breath sounds normal. No respiratory distress. Abdominal: Soft.  There is no tenderness. Psychiatric: Patient has a normal mood and affect. behavior is normal. Judgment and thought content normal.   Vitals:   04/01/24 1323  BP: 136/72  Pulse: 67  Resp: 16  SpO2: 98%  Weight: 115 lb (52.2 kg)  Height: 5' 4 (1.626 m)  Body mass index is 19.74 kg/m.  Recent Results (from the past 2160 hours)  POCT glycosylated hemoglobin (Hb A1C)     Status: Abnormal   Collection Time: 04/01/24  1:29 PM  Result Value Ref Range   Hemoglobin A1C 6.8 (A) 4.0 - 5.6 %   HbA1c POC (<> result, manual entry)     HbA1c, POC (prediabetic range)     HbA1c, POC (controlled diabetic range)      Diabetic Foot Exam:     PHQ2/9:    04/01/2024    1:23 PM 11/12/2023    9:34 AM 10/02/2023    2:00 PM 04/01/2023    1:47 PM 11/21/2022    1:22 PM  Depression screen PHQ 2/9  Decreased Interest 0 1 0 0 0  Down, Depressed, Hopeless 0 1 0 1 0  PHQ - 2 Score 0 2 0 1 0  Altered sleeping  0 0 0 0  Tired, decreased energy  1 0 0 0  Change in appetite  0 0 0 1  Feeling bad or failure about yourself   0 0 0 0  Trouble concentrating  0 0 0 0  Moving slowly or fidgety/restless  0 0 0 0  Suicidal thoughts  0 0 0 0  PHQ-9 Score  3 0 1 1  Difficult doing work/chores  Not difficult at all Not difficult at all      phq 9 is negative  Fall Risk:    04/01/2024    1:17 PM 11/12/2023    9:38 AM 10/02/2023    1:50 PM 04/01/2023    1:46 PM 11/21/2022    1:22 PM  Fall Risk   Falls in the past year? 0 0 0 0 0  Number falls in past yr: 0 0 0 0 0  Injury with Fall? 0 0 0 0 0  Risk for fall due to : No Fall Risks No Fall Risks No Fall Risks No Fall Risks No Fall Risks  Follow up Falls prevention discussed;Education provided;Falls evaluation  completed Falls prevention discussed;Falls evaluation completed Falls prevention discussed;Education provided;Falls evaluation completed Falls prevention discussed Falls prevention discussed     Assessment & Plan  1. Diabetic polyneuropathy associated with type 2 diabetes mellitus (HCC) (Primary)  - POCT glycosylated hemoglobin (Hb A1C) - Comprehensive metabolic panel with GFR - Microalbumin / creatinine urine ratio  2. PAD (peripheral artery disease) (HCC)  On statin therapy and active   3. Gastroesophageal reflux disease without esophagitis  Doing well   4. Osteopenia of neck of left femur  - VITAMIN D  25 Hydroxy (Vit-D Deficiency, Fractures)  5. Mixed hyperlipidemia  On statin therapy   6. Restless leg syndrome  Doing better   7. Normocytic anemia  - CBC with Differential/Platelet  8. H/O malignant gastrointestinal stromal tumor (GIST)  Having some loose stools intermittently but denies any pain or blood in stools, she states seldom takes a peptobismol, discussed sending her for evaluation but she would like to hold off for now  Weight is stable  9. Low magnesium  level  - Magnesium   10. Vitamin B1 deficiency  - Vitamin B1  11. Encounter for screening mammogram for malignant neoplasm of breast  - MM 3D DIAGNOSTIC MAMMOGRAM UNILATERAL LEFT BREAST; Future

## 2024-04-04 ENCOUNTER — Ambulatory Visit: Payer: Self-pay | Admitting: Family Medicine

## 2024-04-05 ENCOUNTER — Other Ambulatory Visit: Payer: Self-pay

## 2024-04-05 DIAGNOSIS — D649 Anemia, unspecified: Secondary | ICD-10-CM

## 2024-04-06 LAB — MICROALBUMIN / CREATININE URINE RATIO
Creatinine, Urine: 156 mg/dL (ref 20–275)
Microalb Creat Ratio: 21 mg/g{creat} (ref <30)
Microalb, Ur: 3.3 mg/dL

## 2024-04-06 LAB — CBC WITH DIFFERENTIAL/PLATELET
Absolute Lymphocytes: 1814 {cells}/uL (ref 850–3900)
Absolute Monocytes: 293 {cells}/uL (ref 200–950)
Basophils Absolute: 20 {cells}/uL (ref 0–200)
Basophils Relative: 0.3 %
Eosinophils Absolute: 52 {cells}/uL (ref 15–500)
Eosinophils Relative: 0.8 %
HCT: 34 % — ABNORMAL LOW (ref 35.0–45.0)
Hemoglobin: 10.8 g/dL — ABNORMAL LOW (ref 11.7–15.5)
MCH: 31.2 pg (ref 27.0–33.0)
MCHC: 31.8 g/dL — ABNORMAL LOW (ref 32.0–36.0)
MCV: 98.3 fL (ref 80.0–100.0)
MPV: 10.5 fL (ref 7.5–12.5)
Monocytes Relative: 4.5 %
Neutro Abs: 4323 {cells}/uL (ref 1500–7800)
Neutrophils Relative %: 66.5 %
Platelets: 258 10*3/uL (ref 140–400)
RBC: 3.46 10*6/uL — ABNORMAL LOW (ref 3.80–5.10)
RDW: 12.6 % (ref 11.0–15.0)
Total Lymphocyte: 27.9 %
WBC: 6.5 10*3/uL (ref 3.8–10.8)

## 2024-04-06 LAB — COMPREHENSIVE METABOLIC PANEL WITH GFR
AG Ratio: 1.5 (calc) (ref 1.0–2.5)
ALT: 16 U/L (ref 6–29)
AST: 22 U/L (ref 10–35)
Albumin: 4.3 g/dL (ref 3.6–5.1)
Alkaline phosphatase (APISO): 68 U/L (ref 37–153)
BUN: 15 mg/dL (ref 7–25)
CO2: 28 mmol/L (ref 20–32)
Calcium: 9.5 mg/dL (ref 8.6–10.4)
Chloride: 104 mmol/L (ref 98–110)
Creat: 0.82 mg/dL (ref 0.60–0.95)
Globulin: 2.9 g/dL (ref 1.9–3.7)
Glucose, Bld: 121 mg/dL — ABNORMAL HIGH (ref 65–99)
Potassium: 4.7 mmol/L (ref 3.5–5.3)
Sodium: 142 mmol/L (ref 135–146)
Total Bilirubin: 0.4 mg/dL (ref 0.2–1.2)
Total Protein: 7.2 g/dL (ref 6.1–8.1)
eGFR: 68 mL/min/{1.73_m2} (ref >=60)

## 2024-04-06 LAB — IRON,TIBC AND FERRITIN PANEL
%SAT: 28 % (ref 16–45)
Ferritin: 92 ng/mL (ref 16–288)
Iron: 90 ug/dL (ref 45–160)
TIBC: 319 ug/dL (ref 250–450)

## 2024-04-06 LAB — MAGNESIUM: Magnesium: 1.5 mg/dL (ref 1.5–2.5)

## 2024-04-06 LAB — TEST AUTHORIZATION

## 2024-04-06 LAB — VITAMIN B1: Vitamin B1 (Thiamine): 29 nmol/L (ref 8–30)

## 2024-04-06 LAB — VITAMIN D 25 HYDROXY (VIT D DEFICIENCY, FRACTURES): Vit D, 25-Hydroxy: 55 ng/mL (ref 30–100)

## 2024-04-07 ENCOUNTER — Telehealth: Payer: Self-pay

## 2024-04-07 NOTE — Telephone Encounter (Signed)
 Copied from CRM [id number]. Topic: General - Other >> Apr 06, 2024 12:16 PM Everette C wrote: Reason for CRM: The patient's daughter Ms Monroe Antigua has returned a missed phone call from the practice and would like to please be contacted again when possible at [phone] >> Apr 06, 2024  5:28 PM Felizardo Hotter wrote: Received call from [facility] ask what needs to be done to improve pt's Anemia? Please call daughter Monroe Antigua at [phone] >> Apr 06, 2024 12:48 PM CMA Maricela R wrote: Pt daughter was already informed of lab results by [facility] team.

## 2024-04-07 NOTE — Telephone Encounter (Signed)
 No answer from [place] unable to leave vm due to being full. We are waiting for iron labs to come back and will call her to discuss further what will be the plan. Added on labs have not been back yet.

## 2024-04-29 ENCOUNTER — Other Ambulatory Visit

## 2024-04-29 ENCOUNTER — Ambulatory Visit (INDEPENDENT_AMBULATORY_CARE_PROVIDER_SITE_OTHER): Admitting: Podiatry

## 2024-04-29 DIAGNOSIS — Z91198 Patient's noncompliance with other medical treatment and regimen for other reason: Secondary | ICD-10-CM

## 2024-04-29 NOTE — Progress Notes (Signed)
 1. Failure to attend appointment with reason given    Patient rescheduled appointment.

## 2024-05-09 ENCOUNTER — Ambulatory Visit: Admitting: Podiatry

## 2024-05-09 ENCOUNTER — Encounter: Payer: Self-pay | Admitting: Podiatry

## 2024-05-09 DIAGNOSIS — B351 Tinea unguium: Secondary | ICD-10-CM

## 2024-05-09 DIAGNOSIS — E1151 Type 2 diabetes mellitus with diabetic peripheral angiopathy without gangrene: Secondary | ICD-10-CM

## 2024-05-09 DIAGNOSIS — M79674 Pain in right toe(s): Secondary | ICD-10-CM | POA: Diagnosis not present

## 2024-05-09 DIAGNOSIS — L84 Corns and callosities: Secondary | ICD-10-CM

## 2024-05-09 DIAGNOSIS — M79675 Pain in left toe(s): Secondary | ICD-10-CM

## 2024-05-15 NOTE — Progress Notes (Signed)
 Subjective:  Patient ID: Rachael Castro, female    DOB: 05-26-1931,  MRN: [id number]  89 y.o. female presents with at risk foot care. Pt has h/o NIDDM with PAD and callus(es) b/l feet and painful thick toenails that are difficult to trim. Painful toenails interfere with ambulation. Aggravating factors include wearing enclosed shoe gear. Pain is relieved with periodic professional debridement. Painful calluses are aggravated when weightbearing with and without shoegear. Pain is relieved with periodic professional debridement. Chief Complaint  Patient presents with   [facility]    Rm4 Diabetic/Dr. Glenard last visit June 2025/A1c 7.3     PCP: Sowles, Krichna, MD.  New problem(s): None.   Review of Systems: Negative except as noted in the HPI.   Allergies  Allergen Reactions   Fish Allergy Rash    Rash and vomiting    Objective:  There were no vitals filed for this visit. Constitutional Patient is a pleasant 89 y.o. female in NAD. AAO x 3.  Vascular Capillary fill time to digits <3 seconds.  DP/PT pulse(s) are faintly palpable b/l lower extremities. Pedal hair absent b/l. Lower extremity skin temperature gradient warm to cool b/l. No pain with calf compression b/l. No cyanosis or clubbing noted. No ischemia nor gangrene noted b/l.   Neurologic Protective sensation intact 5/5 intact bilaterally with 10g monofilament b/l. Vibratory sensation intact b/l. No clonus b/l.   Dermatologic Pedal skin is thin, shiny and atrophic b/l.  No open wounds b/l lower extremities. No interdigital macerations b/l lower extremities. Toenails 1-5 b/l elongated, discolored, dystrophic, thickened, crumbly with subungual debris and tenderness to dorsal palpation. Hyperkeratotic lesion(s) medial IPJ of left great toe, medial IPJ of right great toe, submet head 5 left foot, and submet head 5 right foot.  No erythema, no edema, no drainage, no fluctuance.  Orthopedic: Normal muscle strength 5/5 to all lower extremity  muscle groups bilaterally. HAV with bunion deformity noted b/l LE. Hammertoe(s) 2-5 b/l.   Last HgA1c:     Latest Ref Rng & Units 04/01/2024    1:29 PM 10/02/2023    2:05 PM  Hemoglobin A1C  Hemoglobin-A1c 4.0 - 5.6 % 6.8  7.3      Assessment:   1. Pain due to onychomycosis of toenails of both feet   2. Callus of foot   3. Type II diabetes mellitus with peripheral circulatory disorder [facility])    Plan:  Patient was evaluated and treated and all questions answered. Consent given for treatment as described below: Patient was evaluated and treated. All patient's and/or POA's questions/concerns addressed on today's visit. Toenails 1-5 debrided in length and girth without incident. Callus(es) medial IPJ of left great toe, medial IPJ of right great toe, and submet head 5 b/l pared with sharp debridement without incident. Continue daily foot inspections and monitor blood glucose per PCP/Endocrinologist's recommendations. Continue soft, supportive shoe gear daily. Report any pedal injuries to medical professional. Call office if there are any questions/concerns.  Return in about 3 months (around 08/09/2024).  Delon LITTIE Merlin, DPM      [place] LOCATION: [address], [place] [address]                   Office (  [phone]   [facility] LOCATION: [address] [place], [place] [address] Office [phone]

## 2024-05-19 ENCOUNTER — Telehealth: Payer: Self-pay | Admitting: Podiatry

## 2024-05-19 NOTE — Telephone Encounter (Signed)
 Patient would like a rx for diabetic shoes sent to [facility].

## 2024-05-25 ENCOUNTER — Telehealth: Payer: Self-pay | Admitting: Podiatry

## 2024-05-25 NOTE — Telephone Encounter (Signed)
 Patient is calling asking for a rx to be sent over to [facility] for diabetic shoes.

## 2024-05-26 ENCOUNTER — Other Ambulatory Visit (INDEPENDENT_AMBULATORY_CARE_PROVIDER_SITE_OTHER): Admitting: Podiatry

## 2024-05-26 DIAGNOSIS — M2042 Other hammer toe(s) (acquired), left foot: Secondary | ICD-10-CM

## 2024-05-26 DIAGNOSIS — M2012 Hallux valgus (acquired), left foot: Secondary | ICD-10-CM

## 2024-05-26 DIAGNOSIS — L84 Corns and callosities: Secondary | ICD-10-CM

## 2024-05-26 DIAGNOSIS — M2041 Other hammer toe(s) (acquired), right foot: Secondary | ICD-10-CM

## 2024-05-26 DIAGNOSIS — E1151 Type 2 diabetes mellitus with diabetic peripheral angiopathy without gangrene: Secondary | ICD-10-CM

## 2024-05-26 DIAGNOSIS — M2011 Hallux valgus (acquired), right foot: Secondary | ICD-10-CM

## 2024-05-26 NOTE — Progress Notes (Signed)
 1. Type II diabetes mellitus with peripheral circulatory disorder (HCC)   2. Hallux valgus, acquired, bilateral   3. Corns and callosities   4. Acquired hammertoes of both feet    Orders Placed This Encounter  Procedures   For home use only DME Other see comment    To [facility]: Dispense one pair extra depth shoes and 3 pair custom insoles. Offload calluses submet head 5 b/l.    Length of Need:   6 Months

## 2024-07-27 LAB — OPHTHALMOLOGY REPORT-SCANNED

## 2024-08-11 ENCOUNTER — Ambulatory Visit: Admitting: Podiatry

## 2024-08-11 ENCOUNTER — Encounter: Payer: Self-pay | Admitting: Podiatry

## 2024-08-11 DIAGNOSIS — M79674 Pain in right toe(s): Secondary | ICD-10-CM

## 2024-08-11 DIAGNOSIS — M79675 Pain in left toe(s): Secondary | ICD-10-CM

## 2024-08-11 DIAGNOSIS — L84 Corns and callosities: Secondary | ICD-10-CM | POA: Diagnosis not present

## 2024-08-11 DIAGNOSIS — B351 Tinea unguium: Secondary | ICD-10-CM

## 2024-08-11 DIAGNOSIS — E1151 Type 2 diabetes mellitus with diabetic peripheral angiopathy without gangrene: Secondary | ICD-10-CM

## 2024-08-11 NOTE — Progress Notes (Signed)
  Subjective:  Patient ID: Rachael Castro, female    DOB: 1931/01/30,  MRN: [id number]  Rachael Castro presents to clinic today for at risk foot care. Pt has h/o NIDDM with PAD and callus(es) b/l lower extremities and painful mycotic toenails that are difficult to trim. Painful toenails interfere with ambulation. Aggravating factors include wearing enclosed shoe gear. Pain is relieved with periodic professional debridement. Painful calluses are aggravated when weightbearing with and without shoegear. Pain is relieved with periodic professional debridement.  Chief Complaint  Patient presents with   Toe Pain    Last visit with Dr. Glenard was in June. A1c is 7   New problem(s): None.   PCP is Sowles, Krichna, MD.  Allergies  Allergen Reactions   Fish Allergy Rash    Rash and vomiting    Review of Systems: Negative except as noted in the HPI.  Objective:  There were no vitals filed for this visit. Rachael Castro is a pleasant 89 y.o. female WD, WN in NAD. AAO x 3.  Vascular Examination: CFT <3 seconds b/l. DP/PT pulses faintly palpable b/l. Skin temperature gradient warm to warm b/l. No pain with calf compression. No ischemia or gangrene. No cyanosis or clubbing noted b/l. Pedal hair absent.   Neurological Examination: Sensation grossly intact b/l with 10 gram monofilament. Vibratory sensation intact b/l.   Dermatological Examination: Pedal skin warm and supple b/l.   No open wounds. No interdigital macerations.  Toenails 1-5 b/l thick, discolored, elongated with subungual debris and pain on dorsal palpation.    Hyperkeratotic lesion(s) medial IPJ of right great toe and dorsal PIPJ of L 5th toe and submet head 5 b/l.  No erythema, no edema, no drainage, no fluctuance.  Musculoskeletal Examination: Muscle strength 5/5 to all lower extremity muscle groups bilaterally. HAV with bunion bilaterally and hammertoes 2-5 b/l. Has diabetic shoes.  Radiographs:  None  Assessment/Plan: 1. Pain due to onychomycosis of toenails of both feet   2. Corns and callosities   3. Type II diabetes mellitus with peripheral circulatory disorder [facility])   Patient was evaluated and treated. All patient's and/or POA's questions/concerns addressed on today's visit. Mycotic toenails 1-5 b/l debrided in length and girth without incident. Corn(s)/Callus(es) medial IPJ of right great toe, dorsal PIPJ of L 5th toe, and submet head 5 b/l pared with sharp debridement without incident. Continue daily foot inspections and monitor blood glucose per PCP/Endocrinologist's recommendations.Continue soft, supportive shoe gear daily. Report any pedal injuries to medical professional. Call office if there are any questions/concerns. Return in about 3 months (around 11/11/2024).  Delon LITTIE Merlin, DPM      [place] LOCATION: [address] [place] [address]                   Office [phone]   [facility] LOCATION: [address] [place], [place] [address] Office [phone]

## 2024-09-26 ENCOUNTER — Encounter

## 2024-10-03 ENCOUNTER — Ambulatory Visit: Admitting: Family Medicine

## 2024-10-03 ENCOUNTER — Encounter: Payer: Self-pay | Admitting: Family Medicine

## 2024-10-03 VITALS — BP 134/74 | HR 92 | Resp 16 | Ht 64.0 in | Wt 119.5 lb

## 2024-10-03 DIAGNOSIS — E519 Thiamine deficiency, unspecified: Secondary | ICD-10-CM

## 2024-10-03 DIAGNOSIS — E1142 Type 2 diabetes mellitus with diabetic polyneuropathy: Secondary | ICD-10-CM

## 2024-10-03 DIAGNOSIS — R197 Diarrhea, unspecified: Secondary | ICD-10-CM

## 2024-10-03 DIAGNOSIS — Z7984 Long term (current) use of oral hypoglycemic drugs: Secondary | ICD-10-CM | POA: Diagnosis not present

## 2024-10-03 DIAGNOSIS — M85852 Other specified disorders of bone density and structure, left thigh: Secondary | ICD-10-CM

## 2024-10-03 DIAGNOSIS — K219 Gastro-esophageal reflux disease without esophagitis: Secondary | ICD-10-CM

## 2024-10-03 DIAGNOSIS — E785 Hyperlipidemia, unspecified: Secondary | ICD-10-CM

## 2024-10-03 DIAGNOSIS — R142 Eructation: Secondary | ICD-10-CM | POA: Diagnosis not present

## 2024-10-03 DIAGNOSIS — Z23 Encounter for immunization: Secondary | ICD-10-CM

## 2024-10-03 DIAGNOSIS — Z8509 Personal history of malignant neoplasm of other digestive organs: Secondary | ICD-10-CM

## 2024-10-03 DIAGNOSIS — E1169 Type 2 diabetes mellitus with other specified complication: Secondary | ICD-10-CM | POA: Diagnosis not present

## 2024-10-03 LAB — POCT GLYCOSYLATED HEMOGLOBIN (HGB A1C): Hemoglobin A1C: 6.6 % — AB (ref 4.0–5.6)

## 2024-10-03 MED ORDER — OMEPRAZOLE 20 MG PO CPDR: 20.0000 mg | DELAYED_RELEASE_CAPSULE | Freq: Every day | ORAL | 1 refills | Status: AC | PRN

## 2024-10-03 MED ORDER — JANUMET 50-500 MG PO TABS: 1.0000 | ORAL_TABLET | Freq: Every day | ORAL | 1 refills | Status: AC

## 2024-10-03 MED ORDER — ATORVASTATIN CALCIUM 80 MG PO TABS: ORAL_TABLET | ORAL | 1 refills | Status: AC

## 2024-10-03 MED ORDER — GABAPENTIN 300 MG PO CAPS: 300.0000 mg | ORAL_CAPSULE | Freq: Every day | ORAL | 1 refills | Status: AC

## 2024-10-03 NOTE — Progress Notes (Signed)
 Name: Rachael Castro   MRN: [id number]    DOB: Dec 18, 1930   Date:10/03/2024       Progress Note  Subjective  Chief Complaint  Chief Complaint  Patient presents with   Medical Management of Chronic Issues   Discussed the use of AI scribe software for clinical note transcription with the patient, who gave verbal consent to proceed.  History of Present Illness Rachael Castro is a 89 year old female with type 2 diabetes and dyslipidemia who presents for a six-month follow-up.  She manages her type 2 diabetes with Janumet , taking one pill daily, and her diabetes management appears effective, with her A1c decreasing from 6.8% to 6.6%. She occasionally oversteps her diet but generally avoids sweets and carbohydrates. She experiences neuropathy associated with her diabetes, describing burning and tickling sensations in her legs. Gabapentin  at night provides some relief, though symptoms are intermittent and sometimes wake her at night. Her vitamin B1 and B12 levels were previously checked and were normal.  She has a history of dyslipidemia and takes atorvastatin  daily, with no current issues in cholesterol management.  She has been experiencing sour burps for about a month, which she associates with difficulty chewing due to loose dentures. Omeprazole  daily helps manage the symptoms. She has not visited a dentist in three years since her husband's passing, affecting her ability to chew properly.  She reports intermittent diarrhea over the past year, often triggered by difficulty chewing food. She manages this with Pepto Bismol as needed. She has a history of gastrointestinal stromal tumor resection in 2013 and breast cancer, for which she continues regular mammogram screenings.    Patient Active Problem List   Diagnosis Date Noted   Diabetic polyneuropathy associated with type 2 diabetes mellitus (HCC) 04/01/2024   Pain due to onychomycosis of toenails of both feet 09/05/2021   PAD  (peripheral artery disease) 03/29/2021   Plantar flexed metatarsal bone of left foot 02/21/2021   Plantar flexed metatarsal bone of right foot 02/21/2021   Clavi 02/21/2021   Callus of foot 01/25/2021   Restless leg syndrome 01/25/2021   Gastroesophageal reflux disease 01/25/2021   Normocytic anemia 10/18/2020   Impingement syndrome of shoulder region 05/16/2019   Osteopenia 12/24/2017   Hypomagnesemia 09/01/2016   Mixed hyperlipidemia 10/23/2015   H/O malignant gastrointestinal stromal tumor (GIST) 05/30/2015   Postherpetic neuralgia 04/17/2015   DDD (degenerative disc disease), lumbar 04/17/2015   Personal history of malignant neoplasm of breast 07/04/2013   History of colonic polyps     Past Surgical History:  Procedure Laterality Date   ABDOMINAL HYSTERECTOMY     APPENDECTOMY     BREAST BIOPSY Left 09/08/2016   Done by Dr. Dellie   BREAST EXCISIONAL BIOPSY Right 1999   Mastectomy   BREAST SURGERY Right 1999   mastectomy   CATARACT EXTRACTION W/PHACO Left 04/30/2015   Procedure: CATARACT EXTRACTION PHACO AND INTRAOCULAR LENS PLACEMENT (IOC);  Surgeon: Steven Dingeldein, MD;  Location: [facility];  Service: Ophthalmology;  Laterality: Left;  US  01:39 AP% 23.3 CDE 40.42 fluid pack lot # [id number]   COLONOSCOPY  2008   Sankar   EUS  02/12/2012   Procedure: UPPER ENDOSCOPIC ULTRASOUND (EUS) LINEAR;  Surgeon: Toribio SHAUNNA Cedar, MD;  Location: [facility];  Service: Endoscopy;  Laterality: N/A;  radial linear   EYE SURGERY Right 2013   cataract   HERNIA REPAIR  2012   LAPAROTOMY  2013   excision of gastric wall mass    MASTECTOMY  SALPINGOOPHORECTOMY Right 1979   THYROIDECTOMY  1978   TONSILLECTOMY     TUBAL LIGATION     UPPER GI ENDOSCOPY  2013    Family History  Problem Relation Age of Onset   Cancer Mother        cervical   Cancer Father        prostate   Kidney Stones Brother    Glaucoma Brother    Prostatitis Brother    Diabetes Sister    Thyroid  disease  Brother    Cancer Brother        thyroid    Arthritis Brother    Diabetes Sister    Kidney disease Neg Hx    Bladder Cancer Neg Hx    Breast cancer Neg Hx     Social History   Tobacco Use   Smoking status: Never   Smokeless tobacco: Never  Substance Use Topics   Alcohol use: No    Current Medications[1]  Allergies[2]  I personally reviewed active problem list, medication list, allergies, family history with the patient/caregiver today.   ROS  Ten systems reviewed and is negative except as mentioned in HPI    Objective Physical Exam CONSTITUTIONAL: Patient appears well-developed and well-nourished. No distress. HEENT: Head atraumatic, normocephalic, neck supple. CARDIOVASCULAR: Normal rate, regular rhythm and normal heart sounds. No murmur heard. No edema in extremities. PULMONARY: Effort normal and breath sounds normal. Lungs clear to auscultation. No respiratory distress. ABDOMINAL: There is no tenderness or distention. MUSCULOSKELETAL: Normal gait. Without gross motor or sensory deficit. PSYCHIATRIC: Patient has a normal mood and affect. Behavior is normal. Judgment and thought content normal.  Vitals:   10/03/24 1344  BP: 134/74  Pulse: 92  Resp: 16  Weight: 119 lb 8 oz (54.2 kg)  Height: 5' 4 (1.626 m)    Body mass index is 20.51 kg/m.  Recent Results (from the past 2160 hours)  OPHTHALMOLOGY REPORT-SCANNED     Status: None   Collection Time: 07/27/24 11:56 AM  Result Value Ref Range   HM Diabetic Eye Exam No Retinopathy No Retinopathy    Comment: ABS BY HIM   A Comment    POCT glycosylated hemoglobin (Hb A1C)     Status: Abnormal   Collection Time: 10/03/24  1:45 PM  Result Value Ref Range   Hemoglobin A1C 6.6 (A) 4.0 - 5.6 %   HbA1c POC (<> result, manual entry)     HbA1c, POC (prediabetic range)     HbA1c, POC (controlled diabetic range)       PHQ2/9:    10/03/2024    1:35 PM 04/01/2024    1:23 PM 11/12/2023    9:34 AM 10/02/2023    2:00  PM 04/01/2023    1:47 PM  Depression screen PHQ 2/9  Decreased Interest 0 0 1 0 0  Down, Depressed, Hopeless 0 0 1 0 1  PHQ - 2 Score 0 0 2 0 1  Altered sleeping   0 0 0  Tired, decreased energy   1 0 0  Change in appetite   0 0 0  Feeling bad or failure about yourself    0 0 0  Trouble concentrating   0 0 0  Moving slowly or fidgety/restless   0 0 0  Suicidal thoughts   0 0 0  PHQ-9 Score   3  0  1   Difficult doing work/chores   Not difficult at all Not difficult at all      Data saved  with a previous flowsheet row definition    phq 9 is negative  Fall Risk:    10/03/2024    1:35 PM 04/01/2024    1:17 PM 11/12/2023    9:38 AM 10/02/2023    1:50 PM 04/01/2023    1:46 PM  Fall Risk   Falls in the past year? 0 0 0 0 0  Number falls in past yr: 0 0 0 0 0  Injury with Fall? 0 0  0  0  0   Risk for fall due to : No Fall Risks No Fall Risks No Fall Risks No Fall Risks No Fall Risks  Follow up Falls evaluation completed Falls prevention discussed;Education provided;Falls evaluation completed Falls prevention discussed;Falls evaluation completed Falls prevention discussed;Education provided;Falls evaluation completed Falls prevention discussed     Data saved with a previous flowsheet row definition    Assessment & Plan Type 2 diabetes mellitus complicated by diabetic polyneuropathy and dyslipidemia Diabetes well-controlled with A1c of 6.6%. Neuropathy managed with gabapentin . Dyslipidemia managed with atorvastatin . - Continue Janumet  50/1000 mg once daily. - Continue atorvastatin . - Continue gabapentin .  Gastroesophageal reflux disease/Eructation  GERD symptoms persist despite omeprazole , possibly due to difficulty swallowing meat. - Continue omeprazole  daily. - Advised to cut or shred meat.  Intermittent diarrhea Intermittent diarrhea possibly related to swallowing difficulties. - Referred to gastroenterologist for evaluation.  Osteopenia of left femur neck Low fracture  risk. - Continue monitoring bone density.  Personal history of malignant gastrointestinal stromal tumor History of resection in 2013, no current follow-up. - Referred to gastroenterologist for evaluation.  Osteopenia femur - recheck bone density in one year - continue vitamin D  supplementation        [1]  Current Outpatient Medications:    atorvastatin  (LIPITOR) 80 MG tablet, TAKE 1 TABLET BY MOUTH EVERYDAY AT BEDTIME, Disp: 90 tablet, Rfl: 1   calcium  citrate-vitamin D  200-200 MG-UNIT TABS, Take 1 tablet by mouth daily., Disp: , Rfl:    gabapentin  (NEURONTIN ) 300 MG capsule, Take 1 capsule (300 mg total) by mouth at bedtime., Disp: 90 capsule, Rfl: 1   loratadine  (CLARITIN ) 10 MG tablet, Take 1 tablet (10 mg total) by mouth daily., Disp: 90 tablet, Rfl: 3   Magnesium  100 MG CAPS, Take 1 capsule by mouth daily., Disp: , Rfl:    Multiple Vitamin (MULTIVITAMIN) tablet, Take 1 tablet by mouth daily., Disp: , Rfl:    omeprazole  (PRILOSEC) 20 MG capsule, Take 1 capsule (20 mg total) by mouth daily as needed., Disp: 90 capsule, Rfl: 1   sitaGLIPtin -metformin  (JANUMET ) 50-500 MG tablet, Take 1 tablet by mouth daily., Disp: 90 tablet, Rfl: 1   thiamine (VITAMIN B-1) 100 MG tablet, Take 100 mg by mouth daily., Disp: , Rfl:    aspirin EC 81 MG tablet, Take 81 mg by mouth daily. Swallow whole. (Patient not taking: Reported on 10/03/2024), Disp: , Rfl:  [2]  Allergies Allergen Reactions   Fish Allergy Rash    Rash and vomiting

## 2024-10-07 ENCOUNTER — Telehealth: Payer: Self-pay

## 2024-10-07 NOTE — Telephone Encounter (Signed)
 Copied from CRM [id number]. Topic: Clinical - Request for Lab/Test Order >> Oct 07, 2024  9:06 AM Deaijah H wrote: Reason for CRM: Patient called in stating she didn't have blood work completed at her follow up like normally. Please call to confirm. Would like VM left if she does not answer

## 2024-10-07 NOTE — Telephone Encounter (Signed)
 Advised pt PCP did blood work in June

## 2024-10-10 ENCOUNTER — Other Ambulatory Visit: Payer: Self-pay | Admitting: Family Medicine

## 2024-10-10 ENCOUNTER — Ambulatory Visit
Admission: RE | Admit: 2024-10-10 | Discharge: 2024-10-10 | Disposition: A | Discharge status: Home / Self Care | Source: Ambulatory Visit | Attending: Family Medicine | Admitting: Family Medicine

## 2024-10-10 DIAGNOSIS — J3489 Other specified disorders of nose and nasal sinuses: Secondary | ICD-10-CM

## 2024-10-10 DIAGNOSIS — Z1231 Encounter for screening mammogram for malignant neoplasm of breast: Secondary | ICD-10-CM | POA: Diagnosis present

## 2024-10-12 NOTE — Telephone Encounter (Signed)
 Requested Prescriptions  Pending Prescriptions Disp Refills   loratadine  (CLARITIN ) 10 MG tablet [Pharmacy Med Name: LORATADINE  10 MG TABLET] 90 tablet 1    Sig: TAKE 1 TABLET BY MOUTH EVERY DAY     Ear, Nose, and Throat:  Antihistamines 2 Passed - 10/12/2024 11:08 AM      Passed - Cr in normal range and within 360 days    Creat  Date Value Ref Range Status  04/01/2024 0.82 0.60 - 0.95 mg/dL Final   Creatinine, Urine  Date Value Ref Range Status  04/01/2024 156 20 - 275 mg/dL Final         Passed - Valid encounter within last 12 months    Recent Outpatient Visits           1 week ago Diabetic polyneuropathy associated with type 2 diabetes mellitus [facility])   [facility] Glenard Mire, MD   6 months ago Diabetic polyneuropathy associated with type 2 diabetes mellitus [facility])   [facility] Sowles, Krichna, MD

## 2024-11-17 ENCOUNTER — Ambulatory Visit: Payer: Self-pay

## 2024-11-17 DIAGNOSIS — Z0000 Encounter for general adult medical examination without abnormal findings: Secondary | ICD-10-CM | POA: Diagnosis not present

## 2024-11-17 NOTE — Patient Instructions (Addendum)
 [name],  Thank you for taking the time for your Medicare Wellness Visit. I appreciate your continued commitment to your health goals. Please review the care plan we discussed, and feel free to reach out if I can assist you further.  Please note that Annual Wellness Visits do not include a physical exam. Some assessments may be limited, especially if the visit was conducted virtually. If needed, we may recommend an in-person follow-up with your provider.  Ongoing Care Seeing your primary care provider every 3 to 6 months helps us  monitor your health and provide consistent, personalized care. 02/03/25 @ 1:40 PM APPT W/ DR.SOWLES  Referrals If a referral was made during today's visit and you haven't received any updates within two weeks, please contact the referred provider directly to check on the status.  Recommended Screenings:  Health Maintenance  Topic Date Due   COVID-19 Vaccine (8 - 2025-26 season) 06/20/2024   Medicare Annual Wellness Visit  11/11/2024   Complete foot exam   01/20/2025   Hemoglobin A1C  04/03/2025   Osteoporosis screening with Bone Density Scan  06/14/2025   Eye exam for diabetics  07/27/2025   Breast Cancer Screening  10/10/2025   DTaP/Tdap/Td vaccine (2 - Td or Tdap) 11/25/2032   Pneumococcal Vaccine for age over 76  Completed   Flu Shot  Completed   Zoster (Shingles) Vaccine  Completed   Meningitis B Vaccine  Aged Out   Colon Cancer Screening  Discontinued     Vision: Annual vision screenings are recommended for early detection of glaucoma, cataracts, and diabetic retinopathy. These exams can also reveal signs of chronic conditions such as diabetes and high blood pressure.  Dental: Annual dental screenings help detect early signs of oral cancer, gum disease, and other conditions linked to overall health, including heart disease and diabetes.  Please see the attached documents for additional preventive care recommendations.   NEXT AWV 11/23/25 @ 9:30 AM  IN PERSON

## 2024-11-17 NOTE — Progress Notes (Signed)
 "  Chief Complaint  Patient presents with   Medicare Wellness     Subjective:   Rachael Castro is a 89 y.o. female who presents for a Medicare Annual Wellness Visit.  Visit info / Clinical Intake: Medicare Wellness Visit Type:: Subsequent Annual Wellness Visit Persons participating in visit and providing information:: patient Medicare Wellness Visit Mode:: Telephone If telephone:: video declined Since this visit was completed virtually, some vitals may be partially provided or unavailable. Missing vitals are due to the limitations of the virtual format.: Unable to obtain vitals - no equipment If Telephone or Video please confirm:: I connected with patient using audio/video enable telemedicine. I verified patient identity with two identifiers, discussed telehealth limitations, and patient agreed to proceed. Patient Location:: home Provider Location:: office Interpreter Needed?: No Pre-visit prep was completed: yes AWV questionnaire completed by patient prior to visit?: no Living arrangements:: (!) lives alone Patient's Overall Health Status Rating: good Typical amount of pain: none Does pain affect daily life?: no Are you currently prescribed opioids?: no  Dietary Habits and Nutritional Risks How many meals a day?: 2 (snacks) Eats fruit and vegetables daily?: yes Most meals are obtained by: preparing own meals In the last 2 weeks, have you had any of the following?: (!) nausea, vomiting, diarrhea (diarrhea) Diabetic:: no  Functional Status Activities of Daily Living (to include ambulation/medication): Independent Ambulation: Independent Medication Administration: Independent Home Management (perform basic housework or laundry): Independent Manage your own finances?: yes Primary transportation is: family / friends Concerns about vision?: no *vision screening is required for [facility]* (rx glasses- Dr.Dingeldein) Concerns about hearing?: no  Fall Screening Falls in the past  year?: 0 Number of falls in past year: 0 Was there an injury with Fall?: 0 Fall Risk Category Calculator: 0 Patient Fall Risk Level: Low Fall Risk  Fall Risk Patient at Risk for Falls Due to: No Fall Risks Fall risk Follow up: Falls evaluation completed; Falls prevention discussed  Home and Transportation Safety: All rugs have non-skid backing?: yes All stairs or steps have railings?: (!) no Grab bars in the bathtub or shower?: yes Have non-skid surface in bathtub or shower?: yes Good home lighting?: yes Regular seat belt use?: yes Hospital stays in the last year:: no  Cognitive Assessment Difficulty concentrating, remembering, or making decisions? : no Will 6CIT or Mini Cog be Completed: yes What year is it?: 0 points What month is it?: 0 points Give patient an address phrase to remember (5 components): [address] [place], [place] About what time is it?: 0 points Count backwards from 20 to 1: 0 points Say the months of the year in reverse: 4 points Repeat the address phrase from earlier: 6 points 6 CIT Score: 10 points  Advance Directives (For Healthcare) Does Patient Have a Medical Advance Directive?: No Would patient like information on creating a medical advance directive?: No - Patient declined  Reviewed/Updated  Reviewed/Updated: Reviewed All (Medical, Surgical, Family, Medications, Allergies, Care Teams, Patient Goals)    Allergies (verified) Fish allergy   Current Medications (verified) Outpatient Encounter Medications as of 11/17/2024  Medication Sig   atorvastatin  (LIPITOR) 80 MG tablet TAKE 1 TABLET BY MOUTH EVERYDAY AT BEDTIME   calcium  citrate-vitamin D  200-200 MG-UNIT TABS Take 1 tablet by mouth daily.   gabapentin  (NEURONTIN ) 300 MG capsule Take 1 capsule (300 mg total) by mouth at bedtime.   loratadine  (CLARITIN ) 10 MG tablet TAKE 1 TABLET BY MOUTH EVERY DAY   Multiple Vitamin (MULTIVITAMIN) tablet Take  1 tablet by mouth daily.   omeprazole   (PRILOSEC) 20 MG capsule Take 1 capsule (20 mg total) by mouth daily as needed.   sitaGLIPtin -metformin  (JANUMET ) 50-500 MG tablet Take 1 tablet by mouth daily.   thiamine (VITAMIN B-1) 100 MG tablet Take 100 mg by mouth daily.   aspirin EC 81 MG tablet Take 81 mg by mouth daily. Swallow whole. (Patient not taking: Reported on 11/17/2024)   Magnesium  100 MG CAPS Take 1 capsule by mouth daily. (Patient not taking: Reported on 11/17/2024)   No facility-administered encounter medications on file as of 11/17/2024.    History: Past Medical History:  Diagnosis Date   Breast cancer (HCC) 1999   right breast   Diabetes mellitus    GERD (gastroesophageal reflux disease)    GIST (gastrointestinal stromal tumor), malignant (HCC) 2013   History of shingles    Hypercholesteremia    Hypertension    Kidney stones    Malignant neoplasm of other specified sites of stomach 2013   GIST   Osteopenia 12/24/2017   March 2019; next scan March 2021   Personal history of chemotherapy    Personal history of colonic polyps    Thyroid  disease    Past Surgical History:  Procedure Laterality Date   ABDOMINAL HYSTERECTOMY     APPENDECTOMY     BREAST BIOPSY Left 09/08/2016   Done by Dr. Dellie   BREAST EXCISIONAL BIOPSY Right 1999   Mastectomy   BREAST SURGERY Right 1999   mastectomy   CATARACT EXTRACTION W/PHACO Left 04/30/2015   Procedure: CATARACT EXTRACTION PHACO AND INTRAOCULAR LENS PLACEMENT (IOC);  Surgeon: Steven Dingeldein, MD;  Location: [facility];  Service: Ophthalmology;  Laterality: Left;  US  01:39 AP% 23.3 CDE 40.42 fluid pack lot # [id number]   COLONOSCOPY  2008   Sankar   EUS  02/12/2012   Procedure: UPPER ENDOSCOPIC ULTRASOUND (EUS) LINEAR;  Surgeon: Toribio SHAUNNA Cedar, MD;  Location: [facility];  Service: Endoscopy;  Laterality: N/A;  radial linear   EYE SURGERY Right 2013   cataract   HERNIA REPAIR  2012   LAPAROTOMY  2013   excision of gastric wall mass    MASTECTOMY      SALPINGOOPHORECTOMY Right 1979   THYROIDECTOMY  1978   TONSILLECTOMY     TUBAL LIGATION     UPPER GI ENDOSCOPY  2013   Family History  Problem Relation Age of Onset   Cancer Mother        cervical   Cancer Father        prostate   Kidney Stones Brother    Glaucoma Brother    Prostatitis Brother    Diabetes Sister    Thyroid  disease Brother    Cancer Brother        thyroid    Arthritis Brother    Diabetes Sister    Kidney disease Neg Hx    Bladder Cancer Neg Hx    Breast cancer Neg Hx    Social History   Occupational History   Not on file  Tobacco Use   Smoking status: Never   Smokeless tobacco: Never  Vaping Use   Vaping status: Never Used  Substance and Sexual Activity   Alcohol use: No   Drug use: No   Sexual activity: Not Currently   Tobacco Counseling Counseling given: Not Answered  SDOH Screenings   Food Insecurity: No Food Insecurity (11/17/2024)  Housing: Unknown (11/17/2024)  Transportation Needs: No Transportation Needs (11/17/2024)  Utilities: Not At Risk (  11/17/2024)  Alcohol Screen: Low Risk (11/12/2023)  Depression (PHQ2-9): Low Risk (11/17/2024)  Financial Resource Strain: Low Risk (11/12/2023)  Physical Activity: Insufficiently Active (11/17/2024)  Social Connections: Moderately Isolated (11/17/2024)  Stress: No Stress Concern Present (11/17/2024)  Tobacco Use: Low Risk (11/17/2024)  Health Literacy: Adequate Health Literacy (11/17/2024)   See flowsheets for full screening details  Depression Screen PHQ 2 & 9 Depression Scale- Over the past 2 weeks, how often have you been bothered by any of the following problems? Little interest or pleasure in doing things: 0 Feeling down, depressed, or hopeless (PHQ Adolescent also includes...irritable): 0 PHQ-2 Total Score: 0 Trouble falling or staying asleep, or sleeping too much: 0 Feeling tired or having little energy: 0 Poor appetite or overeating (PHQ Adolescent also includes...weight loss): 0 Feeling bad  about yourself - or that you are a failure or have let yourself or your family down: 0 Trouble concentrating on things, such as reading the newspaper or watching television (PHQ Adolescent also includes...like school work): 0 Moving or speaking so slowly that other people could have noticed. Or the opposite - being so fidgety or restless that you have been moving around a lot more than usual: 0 Thoughts that you would be better off dead, or of hurting yourself in some way: 0 PHQ-9 Total Score: 0 If you checked off any problems, how difficult have these problems made it for you to do your work, take care of things at home, or get along with other people?: Not difficult at all  Depression Treatment Depression Interventions/Treatment : EYV7-0 Score <4 Follow-up Not Indicated     Goals Addressed             This Visit's Progress    Cut out extra servings               Objective:    There were no vitals filed for this visit. There is no height or weight on file to calculate BMI.  Hearing/Vision screen No results found. Immunizations and Health Maintenance Health Maintenance  Topic Date Due   COVID-19 Vaccine (8 - 2025-26 season) 06/20/2024   FOOT EXAM  01/20/2025   HEMOGLOBIN A1C  04/03/2025   Bone Density Scan  06/14/2025   OPHTHALMOLOGY EXAM  07/27/2025   Mammogram  10/10/2025   Medicare Annual Wellness (AWV)  11/17/2025   DTaP/Tdap/Td (2 - Td or Tdap) 11/25/2032   Pneumococcal Vaccine: 50+ Years  Completed   Influenza Vaccine  Completed   Zoster Vaccines- Shingrix  Completed   Meningococcal B Vaccine  Aged Out   Colonoscopy  Discontinued        Assessment/Plan:  This is a routine wellness examination for [place].  Patient Care Team: Sowles, Krichna, MD as PCP - General (Family Medicine) Loreda Hacker, DPM as Consulting Physician (Podiatry) Marea, Selinda RAMAN, MD as Referring Physician (Vascular Surgery) Dingeldein, Elspeth, MD (Ophthalmology)  I have personally  reviewed and noted the following in the patients chart:   Medical and social history Use of alcohol, tobacco or illicit drugs  Current medications and supplements including opioid prescriptions. Functional ability and status Nutritional status Physical activity Advanced directives List of other physicians Hospitalizations, surgeries, and ER visits in previous 12 months Vitals Screenings to include cognitive, depression, and falls Referrals and appointments  No orders of the defined types were placed in this encounter.  In addition, I have reviewed and discussed with patient certain preventive protocols, quality metrics, and best practice recommendations. A written personalized care plan  for preventive services as well as general preventive health recommendations were provided to patient.   Jhonnie GORMAN Das, LPN   8/70/7973   Return in about 1 year (around 11/17/2025).  After Visit Summary: (MyChart) Due to this being a telephonic visit, the after visit summary with patients personalized plan was offered to patient via MyChart   Nurse Notes: UTD ON SHOTS; UTD ON MAMMOGRAM; AGED OUT OF BDS & COLONOSCOPY  Patient advised to keep follow-up appointment with PCP (02/03/25 @ 1:40PM)  "

## 2024-11-23 ENCOUNTER — Telehealth: Payer: Self-pay

## 2024-11-23 NOTE — Telephone Encounter (Signed)
 Copied from CRM [id number]. Topic: Referral - Question >> Nov 23, 2024  1:39 PM Berwyn MATSU wrote: Reason for CRM: Patient called in requesting or stating she should have a referral for the cancer center. However when she called she was advised there was no referral and was not able to make an appointment.   May you please advise.

## 2024-11-23 NOTE — Telephone Encounter (Signed)
 Lft vm just to give 10 business days for processing

## 2024-11-24 NOTE — Telephone Encounter (Signed)
 She thought you said she needed to go, but I think it was mis understood to be GI for the diarrhea

## 2024-11-24 NOTE — Telephone Encounter (Signed)
Does she need referral???

## 2024-11-28 ENCOUNTER — Ambulatory Visit: Admitting: Podiatry

## 2025-02-03 ENCOUNTER — Ambulatory Visit: Admitting: Family Medicine

## 2025-11-23 ENCOUNTER — Ambulatory Visit
